# Patient Record
Sex: Male | Born: 1955
Health system: Southern US, Community
[De-identification: ages and names within clinical notes are randomized; demographics above are authoritative.]

## PROBLEM LIST (undated history)

## (undated) DIAGNOSIS — T7840XA Allergy, unspecified, initial encounter: Secondary | ICD-10-CM

## (undated) DIAGNOSIS — I10 Essential (primary) hypertension: Secondary | ICD-10-CM

## (undated) DIAGNOSIS — M1990 Unspecified osteoarthritis, unspecified site: Secondary | ICD-10-CM

## (undated) DIAGNOSIS — N400 Enlarged prostate without lower urinary tract symptoms: Secondary | ICD-10-CM

## (undated) DIAGNOSIS — J45909 Unspecified asthma, uncomplicated: Secondary | ICD-10-CM

## (undated) DIAGNOSIS — G4730 Sleep apnea, unspecified: Secondary | ICD-10-CM

## (undated) DIAGNOSIS — K219 Gastro-esophageal reflux disease without esophagitis: Secondary | ICD-10-CM

## (undated) DIAGNOSIS — H269 Unspecified cataract: Secondary | ICD-10-CM

## (undated) HISTORY — PX: JOINT REPLACEMENT: SHX530

## (undated) HISTORY — PX: NASAL SEPTUM SURGERY: SHX37

## (undated) HISTORY — DX: Benign prostatic hyperplasia without lower urinary tract symptoms: N40.0

## (undated) HISTORY — DX: Unspecified cataract: H26.9

## (undated) HISTORY — DX: Sleep apnea, unspecified: G47.30

## (undated) HISTORY — PX: KNEE ARTHROSCOPY: SUR90

## (undated) HISTORY — PX: FRACTURE SURGERY: SHX138

## (undated) HISTORY — DX: Gastro-esophageal reflux disease without esophagitis: K21.9

## (undated) HISTORY — DX: Unspecified asthma, uncomplicated: J45.909

## (undated) HISTORY — DX: Essential (primary) hypertension: I10

## (undated) HISTORY — PX: HEMORRHOID SURGERY: SHX153

## (undated) HISTORY — PX: VASECTOMY: SHX75

## (undated) HISTORY — DX: Allergy, unspecified, initial encounter: T78.40XA

## (undated) HISTORY — PX: TONSILLECTOMY: SHX5217

## (undated) HISTORY — DX: Unspecified osteoarthritis, unspecified site: M19.90

---

## 1984-03-10 HISTORY — PX: KNEE SURGERY: SHX244

## 1988-03-10 HISTORY — PX: APPENDECTOMY: SHX54

## 2005-06-10 ENCOUNTER — Ambulatory Visit: Payer: Self-pay | Admitting: Pulmonary Disease

## 2005-06-25 ENCOUNTER — Ambulatory Visit: Payer: Self-pay

## 2005-06-25 ENCOUNTER — Encounter: Payer: Self-pay | Admitting: Cardiovascular Disease

## 2005-07-22 ENCOUNTER — Ambulatory Visit: Payer: Self-pay | Admitting: Pulmonary Disease

## 2006-03-10 HISTORY — PX: COLONOSCOPY: SHX174

## 2006-04-16 ENCOUNTER — Ambulatory Visit: Payer: Self-pay | Admitting: Internal Medicine

## 2006-05-06 ENCOUNTER — Ambulatory Visit: Payer: Self-pay | Admitting: Internal Medicine

## 2006-05-22 ENCOUNTER — Ambulatory Visit: Payer: Self-pay | Admitting: Pulmonary Disease

## 2006-05-22 ENCOUNTER — Ambulatory Visit: Payer: Self-pay | Admitting: Cardiovascular Disease

## 2006-06-25 ENCOUNTER — Ambulatory Visit: Payer: Self-pay | Admitting: Internal Medicine

## 2006-07-13 ENCOUNTER — Ambulatory Visit: Payer: Self-pay | Admitting: General Surgery

## 2006-07-13 LAB — HM COLONOSCOPY

## 2006-09-25 ENCOUNTER — Ambulatory Visit: Payer: Self-pay | Admitting: Internal Medicine

## 2006-10-16 ENCOUNTER — Ambulatory Visit: Payer: Self-pay | Admitting: Internal Medicine

## 2006-10-29 ENCOUNTER — Ambulatory Visit: Payer: Self-pay | Admitting: Internal Medicine

## 2006-11-17 ENCOUNTER — Ambulatory Visit: Payer: Self-pay | Admitting: Internal Medicine

## 2006-11-24 ENCOUNTER — Ambulatory Visit: Payer: Self-pay | Admitting: Internal Medicine

## 2007-03-15 DIAGNOSIS — I10 Essential (primary) hypertension: Secondary | ICD-10-CM

## 2007-03-15 DIAGNOSIS — J45909 Unspecified asthma, uncomplicated: Secondary | ICD-10-CM | POA: Insufficient documentation

## 2007-03-15 DIAGNOSIS — R05 Cough: Secondary | ICD-10-CM

## 2007-03-15 DIAGNOSIS — K219 Gastro-esophageal reflux disease without esophagitis: Secondary | ICD-10-CM | POA: Insufficient documentation

## 2007-03-15 DIAGNOSIS — R059 Cough, unspecified: Secondary | ICD-10-CM | POA: Insufficient documentation

## 2007-03-15 DIAGNOSIS — J310 Chronic rhinitis: Secondary | ICD-10-CM | POA: Insufficient documentation

## 2007-03-15 HISTORY — DX: Essential (primary) hypertension: I10

## 2007-03-25 ENCOUNTER — Ambulatory Visit: Payer: Self-pay | Admitting: Internal Medicine

## 2007-03-25 DIAGNOSIS — J209 Acute bronchitis, unspecified: Secondary | ICD-10-CM | POA: Insufficient documentation

## 2007-04-22 ENCOUNTER — Ambulatory Visit: Payer: Self-pay | Admitting: Internal Medicine

## 2007-07-06 ENCOUNTER — Telehealth: Payer: Self-pay | Admitting: Internal Medicine

## 2008-11-20 ENCOUNTER — Ambulatory Visit: Payer: Self-pay | Admitting: Family Medicine

## 2009-01-12 ENCOUNTER — Ambulatory Visit: Payer: Self-pay | Admitting: Family Medicine

## 2010-07-02 ENCOUNTER — Ambulatory Visit: Payer: Self-pay | Admitting: Unknown Physician Specialty

## 2010-07-03 LAB — PATHOLOGY REPORT

## 2010-07-23 NOTE — Assessment & Plan Note (Signed)
[facility]                             PULMONARY OFFICE NOTE   NAME:[name], [name] [name]              MRN:          [id number]  DATE:11/17/2006                            DOB:          1956/01/16    HISTORY:  This 55 year old white male with marked improvement in chronic  cough when I saw him on October 29, 2006, to the point where I was  encouraged and recommended a trial on a lower dose of Nexium.  He found  that within one week of reducing the Nexium, that the cough began again.  It did not respond to Prilosec over-the-counter.  The cough is dry in  nature, worse as the day goes on and he actually has overt heartburn.  I  understand that he has already seen a gastroenterologist in [place],  who reports that he does have a hiatal hernia.   The patient denies any excessive sputum production, active nasal  complaints, itching, sneezing or subjective wheezing, orthopnea, PND,  leg swelling or significant nocturnal exacerbation of his symptoms.   CURRENT MEDICATIONS:  Please see the face sheet dated November 17, 2006.  Note that he was taken off of Tekturna/hydrochlorothiazide because of  possible adverse effect and placed on Diovan 320 mg one daily, on his  previous visit.   PHYSICAL EXAMINATION:  VITAL SIGNS:  On this regimen his blood pressure  is 148/90.  He is afebrile.  Benign vital signs.  GENERAL:  He looks great.  HEENT:  Unremarkable.  Pharynx clear.  LUNGS:  Fields are clear bilaterally to auscultation and percussion with  no cough elicited on inspiratory or expiratory maneuvers.  He did have  the urge to cough, however, on inspiration.  HEART:  A regular rate and rhythm without murmur, gallop or rub.  ABDOMEN:  Soft.  EXTREMITIES:  Warm without calf tenderness, cyanosis, clubbing or edema.   IMPRESSION:  1. Cough had definitely improved on Nexium and now is exacerbated      again on lower doses and alternative/over-the-counter  medications.      I believe high-dose Nexium is justified, as well as regular      gastrointestinal followup, but I will leave that to Dr. Lynnell Grain discretion.  2. One of the medicines that may be interfering with treatment of      reflux is Norvasc, since he is taking it in high doses and it      relaxes the gastroesophageal valve.  I am going to take the bold      step of re-challenging with Tekturna, this time without the      hydrochlorothiazide and see if we can use Tekturna  300 mg one      daily and eliminate the amlodipine from his regimen.  If not, I      might consider nocturnal Tenex, or even a specific beta blocker      like bisoprolol instead of the amlodipine, at least until we figure      out whether or not this is contributing to his refractory reflux.   FOLLOWUP:  Pulmonary followup can be p.r.n.  since he is doing actually  very well from my perspective on his present regimen, which I have  reviewed with him in writing in detail line by line.  I have also asked  him to show the same medication calendar that he showed me to Dr.  Sullivan Lone, so that we can all read off the same page going forward.  I  will see the patient back on a p.r.n. basis.     Charlaine Dalton. Sherene Sires, MD, [facility]  Electronically Signed    MBW/MedQ  DD: 11/17/2006  DT: 11/18/2006  Job #: [id number]   cc:   Julieanne Manson

## 2010-07-23 NOTE — Assessment & Plan Note (Signed)
[facility]                             PULMONARY OFFICE NOTE   NAME:Fero, ELJAY LAVE              MRN:          [id number]  DATE:11/24/2006                            DOB:          02/14/1956    PULMONARY/EXTENDED FOLLOWUP OFFICE VISIT   HISTORY:  This is a very challenging 55 year old white male with severe  hypertension and chronic cough that is felt to be at least partially  upper airway in nature related to rhinitis, reflux, or both.  Because  reflux entered the differential, I recommended eliminating Norvasc on  his previous visit and continuing Tekturna 300 mg daily and he was able  to tolerate this fine, although it is not clear that this worked  effectively.  However, he comes back today acutely ill for the last few  days with a hacking cough productive of green sputum, subjective  wheezing, and chest congestion, but no pleuritic pain, nocturnal  wheeze, fever, chills, sweats, orthopnea, PND, or leg swelling.   For full inventory of medications, please see face sheet dated November 24, 2006, which is correct as listed.   PHYSICAL EXAMINATION:  This is an anxious white male in no acute  distress.  VITAL SIGNS:  Stable vital signs except for a blood pressure 168/110.  Afebrile.  HEENT:  Unremarkable.  Oropharynx is clear.  LUNGS:  The lung fields reveal junky inspiratory and expiratory rhonchi  bilaterally.  CARDIAC:  Regular rate and rhythm without murmur, gallop, or rub.  ABDOMEN:  Soft and benign.  EXTREMITIES:  Warm without calf tenderness, cyanosis, clubbing, or  edema.   IMPRESSION:  1. Acute flare-up of chronic asthmatic bronchitis.  I recommended      Doxycycline for 10 days, prednisone for 8 days starting at 40 mg      per day and tapering off.  2. In terms of hypertension management, Danie Chandler is not effective, and      perhaps a bit redundant since he is on Diovan.  I have recommended      he continue Diovan 320 q.  a.m. and add Tenex 2 mg nightly (I would      like to avoid calcium channel blockers here because the issue of      reflux has not been totally addressed yet).   I went over each and every one of these recommendations with him in  writing, including using all of his p.r.n. medications.  I would also  add parenthetically that he seems to be breaking right through Singulair  and Symbicort, and it may be that he does not need both, but would  benefit  from perhaps a little higher dose of Symbicort and eliminating Singulair  from his regimen to simplify his regimen, but will address this on his  next visit.     Charlaine Dalton. Sherene Sires, MD, [facility]  Electronically Signed    MBW/MedQ  DD: 11/24/2006  DT: 11/24/2006  Job #: [id number]   cc:   Julieanne Manson

## 2010-07-23 NOTE — Assessment & Plan Note (Signed)
[facility]                             PULMONARY OFFICE NOTE   NAME:[name] [name], [name] [name]              MRN:          [id number]  DATE:10/16/2006                            DOB:          1955-06-24    HISTORY OF PRESENT ILLNESS:  Patient is a 55 year old white male patient  of Dr. Thurston Hole  who has a known history of asthma and cyclical cough,  returns today for 2 week follow up and for med review. Last visit  patient's cough had totally improved by eliminating Advair and  maximizing his reflux prevention regimen. Unfortunately, he developed  wheezing off of his Advair. Last visit patient was started on Symbicort  80/4.5 two puffs twice daily. And given a prednisone pack. Also  Singulair was added back into his regimen. Patient returns back today  reporting that his wheezing has totally resolved. He has not had to use  ProAir inhaler since last visit. However his cough has slowly returned,  is worsened when he talks for a long period of time, laughs, and at  bedtime. Patient's blood pressure had been elevated last visit. Patient  had been changed over to Tekturna/hydrochlorothiazide 300/25. Patient's  blood pressures have been doing exceptionally well at home, systolic  pressures at 122/40. Patient denies any purulent sputum, hemoptysis,  orthopnea, PND, patient does have some nasal congestion but is not aware  of any significant postnasal drip symptoms.   PAST MEDICAL HISTORY:  Reviewed.   CURRENT MEDICATIONS:  Reviewed.   PHYSICAL EXAMINATION:  Patient is a pleasant thin, male in no acute  distress. He is afebrile with stable vital signs.  HEENT: Nasal mucosa is erythematous. Nontender sinuses. Poster pharynx  is clear without an exudate or redness.  NECK: Supple without cervical adenopathy. No JVD.  LUNG SOUNDS Clear without any wheezing or crackles. No pseudo wheezing  is noted.  CARDIAC: Regular rate.  ABDOMEN: Soft and nontender.  EXTREMITIES: Warm without any edema.   IMPRESSION AND PLAN:  1. Asthma with tendency towards cyclical cough. Patient's asthma is      well controlled on Symbicort. However is cough has slowly returned.      Which could also be do to Symbicort however patient does appear to      be doing very well and cough is not as bad as it was at one time.      Therefore we will try to eliminate his cough by using a cough      suppression regimen including Mucinex DM twice daily, tramadol for      breakthrough coughing. And we will add back in Zyrtec 10 mg at      bedtime as needed for any postnasal drip symptoms that could be      irritating his airways. Patient will return back here in 4 weeks      with Dr. Sherene Sires or sooner if needed.  2. Complex medication regimen, patient's medication reviewed in      detail. Patient education      was provided. A computerized medication calendar was completed for      this patient and reviewed.  Rubye Oaks, NP  Electronically Signed      Charlaine Dalton. Sherene Sires, MD, [facility]  Electronically Signed   TP/MedQ  DD: 10/16/2006  DT: 10/16/2006  Job #: [id number]

## 2010-07-23 NOTE — Assessment & Plan Note (Signed)
[facility]                             PULMONARY OFFICE NOTE   NAME:[name] [name], [name] [name]              MRN:          [id number]  DATE:09/25/2006                            DOB:          Feb 25, 1956    HISTORY:  A 55 year old white male with refractory symptoms of coughing,  wheezing, and dyspnea, who also carries a diagnosis of reflux, by trial  and error we had eliminated his cough by eliminating Advair, and I  assumed, therefore, that it was irritating the upper airway more than it  was helping the lower airway.  However, since that change was made, he  has now shifted focus to dyspnea and subjective wheezing with nocturnal  awakening that he says, in retrospect, has been present for over a year.  He averages 1 or 2 uses of albuterol per day and actually comes in today  having run out of albuterol.   1. Presently, he is maintained on a combination of Symbicort 80/4.5      two puffs q.i.d.  2. Amlodipine 5 mg daily.  3. Tekturna 300 mg daily.  4. Nexium 40 mg daily.   He had stopped Singulair on his previous visit because he had  breakthrough symptoms while on Singulair.  He thinks, maybe his nasal  symptoms are a little worse since he stopped Singulair.  He denies any  pleuritic or exertional chest pain, purulent sputum, fevers, chills,  sweats, orthopnea, PND, or leg swelling.   PHYSICAL EXAMINATION:  He is a pleasant, ambulatory white male in no  acute distress.  Blood pressure 160/90.  HEENT:  Mild turbinate edema.  Oropharynx is clear with no evidence of  excessive post-nasal drainage or cobble stoning.  NECK:  Supple without cervical adenopathy or tenderness.  Trachea is  midline.  No thyromegaly.  LUNGS:  The lung fields reveal both pseudo-wheeze and true wheeze  bilaterally.  Overall air movement is adequate, however.  CARDIAC:  Regular rate and rhythm without murmur, gallop, or rub.  ABDOMEN:  Soft and benign.  EXTREMITIES:  Warm  without calf tenderness, cyanosis, clubbing, or  edema.   Hemoglobin saturation is 99% on room air.   IMPRESSION:  1. This patient clearly has asthma and note that the cough is most      likely agravated by dry powder inhalers.  Therefore, HFA products      are the best bet for now.  I am concerned about pushing the does of      Symbicort higher because of the cough, and the possibility that the      higher dose of inhaled steroids might contribute to cough, and      therefore, I am going to recommend the same dose of Symbicort, but      add back Singulair for 3 weeks empirically.  If this does not work,      the next step is to push the Symbicort higher.  2. His metered dose inhaler technique was adequate at baseline (75%),      but improved to nearly 90% with coaching.  3. Hypertension is not optimally controlled.  I am concerned about  pushing calcium channel blockers higher in this patient with      documented reflux (calcium channel blockers interfere with the      effects of gastroesophageal valve).  Instead, I am going to add      hydrochlorothiazide 25 mg to the Tekturna (I gave him combination      product for the next month) and if his blood pressure improves, I      would not hesitate to eliminate the calcium channel blocker      entirely.  If not, perhaps 1 option might be to switch over to      minoxidil or Cardura for vasodilator purposes depending on whether      he is having any benign prostatic hypertrophy symptoms at all.  4. Also, I am concerned about his ability to process multiple      medication titrations using the medication sheet that he brought      with him today, which is actually not fully filled out.  I have      recommended that he return to our nurse practitioner within 3 weeks      for the purpose of medication reconciliation and generation of a      new medication calendar.     Charlaine Dalton. Sherene Sires, MD, [facility]  Electronically Signed    MBW/MedQ   DD: 09/25/2006  DT: 09/25/2006  Job #: [id number]   cc:   Julieanne Manson  Sidney Ace, M.D. [facility]

## 2010-07-23 NOTE — Assessment & Plan Note (Signed)
[facility]                             PULMONARY OFFICE NOTE   NAME:Mickley, DARTAGNAN BEAVERS              MRN:          [id number]  DATE:10/29/2006                            DOB:          11/19/1955    PULMONARY ACUTE OFFICE EVALUATION.   HISTORY:  Mr. Krotzer is a 55 year old white male with marked  improvement in chronic cough since change in medications but, as  noticed, which includes the elimination of Ace inhibitors and dry  powdered inhalers and includes Nexium 40 mg b.i.d. plus Tekturna 300/25  one daily.  He says he feels that this is too strong and has been having  decreased sexual function, erectile function as well as aches in his  hips that he attributes to the medication change.  However, he has also  noticed increased diarrhea but note he has had no significant dyspnea or  cough, the original complaints that led to his evaluation.   PHYSICAL EXAMINATION:  He is a somewhat anxious ambulatory white man in  no acute distress with a blood pressure of 140/80.  He is afebrile with  normal vital signs.  HEENT:  Unremarkable.  Oropharynx clear.  LUNG FIELDS:  Clear bilaterally to auscultation and percussion with no  cough, lifts on inspiratory or expiratory maneuvers.  HEART:  Regular rhythm without any murmur, gallop or rub.  ABDOMEN:  Soft, benign.  EXTREMITIES:  Warm without any calf tenderness, cyanosis, clubbing,  edema.   IMPRESSION:  1. Hypertension is well controlled but may be having adverse effect      from Tekturna, the only new antihypertensive that was added.  I      recommended switching over to Diovan 320 one daily and advised him      that his blood pressure and/or leg swelling may be slightly      worsened on this regimen (without the diuretic) but that it would      be reasonable to try adding back a diuretic in the form of      furosemide since one question is whether the hydrochlorothiazide is      the culprit  here.  2. Diarrhea.  The only medication associated with diarrhea was Nexium      which I have asked him to reduce down to one daily to see if it      makes any difference in terms of this symptom.  If his diarrhea and      joint aches continue, I would more concerned about the possibility      of inflammatory bowel disease.   He is already scheduled to see [place] in 2 weeks for followup with  evaluation of cough and dyspnea and I have reviewed every one of his  maintenance vs. p.r.n.'s with him to make sure we are all on the same  wave length in terms of medication administration.    Charlaine Dalton. Sherene Sires, MD, [facility]  Electronically Signed   MBW/MedQ  DD: 10/29/2006  DT: 10/30/2006  Job #: [id number]   cc:   Dr. Mason Callas

## 2010-07-26 NOTE — Assessment & Plan Note (Signed)
[facility]                             PULMONARY OFFICE NOTE   NAME:[name], [name] HYUN              MRN:          [id number]  DATE:05/22/2006                            DOB:          10-14-1955    HISTORY OF PRESENT ILLNESS:  The patient is a 55 year old, white, male  patient of Dr. Thurston Hole who has a known history of asthma and rhinitis who  presents for a two week followup.  Last visit, the patient was having  persistent symptoms of cough, wheezing, and shortness of breath.  The  patient was changed over from Advair to Qvar for upper airway  instability and given a prednisone taper.  He was also maximized on his  anti-reflux prevention with Nexium twice daily, and given a cough  suppressant regimen with Delsym and tramadol.  The patient did undergo a  CT scan; however, that was done this morning and results are not  available at today's office visit.  The patient returns today reporting  that he has substantially improved with cough almost totally resolved.  He still has a few wheezes but it is much improved and only occurs with  lying flat or occasionally going up the stairs.  The patient was  supposed to have brought all of his medications in today per request of  Dr. Sherene Sires last visit.  However, the patient reports he is not going to  bring his medicines in because he has a very concise list and does not  wish to load all his medicines throughout the day.   PAST MEDICAL HISTORY:  Reviewed.   CURRENT MEDICATIONS:  Reviewed.   PHYSICAL EXAMINATION:  GENERAL:  The patient is a pleasant male in no  acute distress.  He is afebrile with stable vital signs.  HEENT:  Unremarkable.  NECK:  Supple without cervical adenopathy.  No JVD.  LUNGS:  Clear bilaterally.  CARDIAC:  Regular rate.  ABDOMEN:  Soft and nontender.  EXTREMITIES:  Warm without any edema.   IMPRESSION AND PLAN:  Chronic cough with significant upper airway  instability felt  secondary to possibly rhinitis and/or reflux.  The  patient is much improved.  He will continue on his regimen of Qvar two  puffs twice daily, along with Nexium twice daily for any residual reflux  that could be irritating the airways.  The patient will return back here  in four weeks with Dr. Sherene Sires  as scheduled, or sooner if needed.  Also, we will contact the patient  with the CT of the sinus scan results.      Rubye Oaks, NP  Electronically Signed      Charlaine Dalton. Sherene Sires, MD, [facility]  Electronically Signed   TP/MedQ  DD: 05/22/2006  DT: 05/23/2006  Job #: [id number]

## 2010-07-26 NOTE — Assessment & Plan Note (Signed)
[facility]                             PULMONARY OFFICE NOTE   NAME:[name] [name], [name] [name]              MRN:          [id number]  DATE:04/16/2006                            DOB:          04-05-1955    HISTORY OF PRESENT ILLNESS:  The patient is a 55 year old white male  patient of Dr. Jayme Cloud, who has a known history of cough that presents  for an acute office visit.  The patient complains that over the last 3  months he has had what he describes as poorly controlled asthma with  increased wheezing, nocturnal awakenings and albuterol use.  The patient  complains that he is having to use his albuterol over the last 2 weeks 2  to 3 times a day.  Initially about 3 months ago the symptoms worsened  and he was using it on average about once to twice a day.  The patient  complains of nasal congestion, postnasal drip, wheezing and dry cough.  He denies any purulent sputum, chest pain, overt reflux symptoms, leg  swelling or recent antibiotic use.  The patient does travel quite a bit,  however, has been within the country over the last 6 months.   CURRENT MEDICATIONS:  The medications were reviewed with this patient,  and he does appear to be taking his medications appropriately and  rinsing after his Advair use.  He is presently on:  1. Advair HFA 230/21 two puffs twice daily.  2. Singulair 10 mg daily.  3. Allegra 180 mg daily.  4. Nasacort 2 puffs daily.  5. The patient had also previously been on ACE inhibitor last year,      and this has been discontinued due to cough, and is now presently      on Tekturna and amlodipine for blood pressure.  6. The patient also has a history of reflux and has been taking Nexium      twice a day and denies any breakthrough reflux symptoms.   PAST MEDICAL HISTORY:  Reviewed.   PHYSICAL EXAMINATION:  The patient is a pleasant white male in no acute  distress.  He is afebrile, stable vital signs.  O2 saturation is  95% on room air.  HEENT:  Nasal mucosa is slightly pale.  No adventitial signs.  The  posterior pharynx is clear.  NECK:  Supple without adenopathy.  No JVD.  LUNGS:  Lung sounds are clear to auscultation bilaterally without any  wheezing or crackles.  CARDIAC:  Regular rate and rhythm.  ABDOMEN:  Soft and nontender.  EXTREMITIES:  Warm without any calf tenderness, cyanosis, clubbing or  edema.   DATA:  In-office spirometry reveals an FEV1 of 3.99 liters, which is  103% of predicted.  This is simply no change from previous spirometry in  April of 2007.   IMPRESSION AND PLAN:  1. Asthmatic flare versus upper airway instability secondary to      rhinitis and reflux.  Despite significant symptoms, the patient has      a completely normal spirometry today in the office.  The patient      will begin on an aggressive cough suppression  regimen with Mucinex      DM twice daily, Tessalon Perles as needed and tramadol 50      milligrams as needed for cough control.  He will begin a prednisone      taper over the next week and will recheck here in 2 weeks with Dr.      Jayme Cloud or Dr. Sherene Sires.  2. Rhinitis, which could be contributing to #1.  The patient will be      changed over from Nasacort AQ to Veramyst 2 puffs twice daily.  The      patient may also use saline nasal spray as needed.      Rubye Oaks, NP  Electronically Signed      Gailen Shelter, MD  Electronically Signed   TP/MedQ  DD: 04/16/2006  DT: 04/17/2006  Job #: [id number]

## 2010-07-26 NOTE — Assessment & Plan Note (Signed)
[facility]                             PULMONARY OFFICE NOTE   NAME:[name] [name], [name] [name]              MRN:          [id number]  DATE:06/25/2006                            DOB:          1955-05-05    HISTORY:  A 55 year old white male with chronic cough who carries a  diagnosis of asthma and rhinitis, but whose cough was disproportionate  to objective findings when I saw him on February 28 at the request of  Dr. Parker Callas.  Recommended switching over purely to Qvar as his asthma  medicine, and stopping Advair, as well as continuing to treat reflux  aggressively.  He states that this worked 100% until the onset of  increased nasal congestion over the last several weeks with a sensation  of nasal drainage, but with minimal active sputum production, and  increasing nocturnal need for albuterol with paroxysms of coughing and  dyspnea much different from baseline.   This occurred while still taking Singulair and Qvar consistently, and  using Nasacort 2 puffs b.i.d.   For full inventory of his medicines, which he keeps in a very neat list,  albeit not either user friendly nor well organized in terms of  maintenance versus p.r.n., see column dated June 25, 2006.   PHYSICAL EXAMINATION:  He is a pleasant ambulatory white male, in no  acute distress.  Stable vital signs.  HEENT:  Reveals moderate turbinate edema.  Oropharynx was clear.  NECK:  Supple without cervical adenopathy or tenderness.  Trachea is  midline.  LUNG FIELDS:  Reveal trace wheeze on FVC maneuver only.  HEART:  Regular rhythm without murmur, gallop or rub.  ABDOMEN:  Soft and benign.  EXTREMITIES:  Warm without calf tenderness, cyanosis, clubbing or edema.   Hgb saturation 95% on room air.   IMPRESSION:  First episode of what amounts to asthma exacerbation in the  setting of poorly controlled rhinitis.  I had worked him for sinusitis  as well a month ago, and found that he had  perfectly normal sinuses,  date May 22, 2006, so I doubt this is active sinus infection, but  rather just poorly controlled rhinitis.   One of the issues, of course, is that he is supposed to already be using  Nasacort, but does not have a good feel for how to approach an acute  exacerbation in terms of rhinitis.  I spent extra time going over  chronic rhinitis flare with emphasis using the appropriate use of Afrin  12-hour 1 puff b.i.d. before Nasacort for exacerbations, but only for 5  days.   At this point, I would consider him a Singulair failure, and have asked  him to stop this to simplify his regimen.  I spent most of the extra 15  to 20 minutes going over the chronic rhinitis, but also a reasonable  strategy for him to address p.r.n. issues as follows:  1. For wheezing, use albuterol 2 puffs q.4. p.r.n.  2. For nasal congestion, use Afrin for 5 days.  3. For itching and sneezing, use Allegra, therefore, teasing out of      his list of maintenance medicines.  Those medicines, I think, would      be more appropriate on a p.r.n. basis.   Finally, because he broke through Qvar, I am also going to recommend at  this point switching to Symbicort 80/4.5 two puffs b.i.d., acknowledging  that it is the larger particle, and may cause his cough to exacerbate,  but given the fact that he is having to use albuterol more frequently,  and therefore, breaking the rule of 2's, that it is justified to use  combination therapy here.     Charlaine Dalton. Sherene Sires, MD, [facility]  Electronically Signed    MBW/MedQ  DD: 06/25/2006  DT: 06/26/2006  Job #: [id number]   cc:   Sidney Ace, M.D. [facility]  Richard Sullivan Lone

## 2010-07-26 NOTE — Assessment & Plan Note (Signed)
[facility]                             PULMONARY OFFICE NOTE   [name] [name], [name] [name]              MRN:          [id number]  DATE:05/07/2006                            DOB:          1955-03-21    REFERRING PHYSICIAN:  Sidney Ace, M.D. [facility]   HISTORY:  This is a 55 year old white male who has already seen Dr.  Jayme Cloud for evaluation of dyspnea and asthma.  Interestingly, cough was  not the main problem initially but now it is and now no longer is  responding to treatment directed at asthma.   The patient has had a history of rhinitis for the last 6 years for which  he is on immunotherapy.  He says initially his cough seemed better while  on high doses of inhaled steroids including both Asmanex and Flovent at  the same time, but worse since he switched over to Advair.  He  definitely gets better when he gets a round of prednisone, only to  relapse off of prednisone, and he is worse immediately on lying down at  night but better after he uses albuterol.  Compounding the problem, he  has developed an essential tremor which is made worse by using  albuterol.  Patient denies any pleuritic pain, fever, chills, sweats or  definite environment or weather triggers in terms of his symptoms, which  are sporadic mostly during the day but always occur when he lies down at  night.   PAST MEDICAL HISTORY:  Hypertension for which he has previously been on  ACE inhibitors.   ALLERGIES:  1. SUDAFED.  2. AVELOX.  3. SULFA.   MEDICATIONS:  Taken in detail on the worksheet column dated May 06, 2006.   SOCIAL HISTORY:  He has never smoked.   FAMILY HISTORY:  Significant for asthma in one of his sisters and  allergies in two of his sisters.   REVIEW OF SYSTEMS:  Taken in detail and negative except as outlined  above.   PHYSICAL EXAMINATION:  This is a pleasant ambulatory white male in no  acute distress, afebrile with stable vital signs  except for a blood  pressure 160/94.  HEENT:  Remarkable for moderate turbinate edema with no pallor, polyps  or cyanosis.  OROPHARYNX:  Clear with no evidence of excessive postnasal drainage or  cobblestoning.  NECK:  Supple without cervical adenopathy or tenderness, trachea is  midline, no thyromegaly.  LUNG FIELDS:  Perfectly clear bilaterally to auscultation and  percussion, with no cough elicited on inspiratory or expiratory  maneuvers.  HEART:  Regular rate and rhythm without murmur, gallop or rub.  ABDOMEN:  Soft, benign.  EXTREMITIES:  Warm without any calf tenderness, cyanosis, clubbing or  edema.   Albuterol use was reviewed and is about 75% effective.   IMPRESSION:  Although this patient may have asthma, I do not think it is  the main driving force behind the cough since it has not responded to  Advair.  I am going to ask him to stop Advair because it causes the most  difficulty with upper airway cough, in my experience, related to dry  powder.  I have recommended instead QVAR which has the best profile in  the form of QVAR 40 two puffs twice daily.  I did give him a 6-day  course of prednisone because he says he gets so much better from  prednisone.  If he relapses again on QVAR 40, I would recommend QVAR 80  and only as a last resort add back two inhaled steroids at the same time  because although this may control the airways inflammation better it  will certainly lead to more upper airways instability.   To approach the cyclical reflux component that always develops with this  type cough, I recommended taking Nexium 30 minutes before his first and  last meal, until he returns in 2 weeks for followup, and using Delsym  for cough suppression.   Because the immediate cough occurs when he lies down at night, also I  believe a sinus CT scan is needed here and was ordered (last one was  done several  years ago, per patient report).     [name] [name]. [name] Sires, MD, [facility]   Electronically Signed    MBW/MedQ  DD: 05/07/2006  DT: 05/07/2006  Job #: [id number]   cc:   Sidney Ace, M.D. [facility]  Richard Sullivan Lone

## 2011-01-09 ENCOUNTER — Ambulatory Visit: Payer: Self-pay | Admitting: Internal Medicine

## 2011-04-17 ENCOUNTER — Ambulatory Visit: Payer: Self-pay | Admitting: Family Medicine

## 2011-05-22 ENCOUNTER — Ambulatory Visit: Payer: Self-pay | Admitting: Family Medicine

## 2011-06-17 ENCOUNTER — Ambulatory Visit: Payer: Self-pay | Admitting: Family Medicine

## 2012-09-29 DIAGNOSIS — Z1159 Encounter for screening for other viral diseases: Secondary | ICD-10-CM | POA: Insufficient documentation

## 2013-04-19 ENCOUNTER — Ambulatory Visit: Payer: Self-pay | Admitting: Family Medicine

## 2013-06-27 ENCOUNTER — Encounter: Payer: Self-pay | Admitting: Rheumatology

## 2013-07-08 ENCOUNTER — Encounter: Payer: Self-pay | Admitting: Rheumatology

## 2013-10-12 ENCOUNTER — Encounter: Payer: Self-pay | Admitting: General Surgery

## 2013-10-25 ENCOUNTER — Ambulatory Visit: Payer: Self-pay | Admitting: General Surgery

## 2013-10-26 DIAGNOSIS — Z8768 Personal history of other (corrected) conditions arising in the perinatal period: Secondary | ICD-10-CM | POA: Insufficient documentation

## 2013-10-26 DIAGNOSIS — Z23 Encounter for immunization: Secondary | ICD-10-CM | POA: Insufficient documentation

## 2013-10-26 DIAGNOSIS — R509 Fever, unspecified: Secondary | ICD-10-CM | POA: Insufficient documentation

## 2013-10-26 DIAGNOSIS — Z7189 Other specified counseling: Secondary | ICD-10-CM | POA: Insufficient documentation

## 2013-10-26 DIAGNOSIS — M79609 Pain in unspecified limb: Secondary | ICD-10-CM | POA: Insufficient documentation

## 2013-10-26 DIAGNOSIS — Z87898 Personal history of other specified conditions: Secondary | ICD-10-CM | POA: Insufficient documentation

## 2013-10-26 DIAGNOSIS — I10 Essential (primary) hypertension: Secondary | ICD-10-CM | POA: Insufficient documentation

## 2013-10-26 DIAGNOSIS — L0390 Cellulitis, unspecified: Secondary | ICD-10-CM | POA: Insufficient documentation

## 2013-10-26 DIAGNOSIS — E291 Testicular hypofunction: Secondary | ICD-10-CM | POA: Insufficient documentation

## 2013-10-26 DIAGNOSIS — R002 Palpitations: Secondary | ICD-10-CM | POA: Insufficient documentation

## 2013-11-02 ENCOUNTER — Ambulatory Visit (INDEPENDENT_AMBULATORY_CARE_PROVIDER_SITE_OTHER): Payer: BC Managed Care – PPO | Admitting: General Surgery

## 2013-11-02 ENCOUNTER — Encounter: Payer: Self-pay | Admitting: General Surgery

## 2013-11-02 VITALS — BP 160/82 | HR 88 | Resp 14 | Ht 72.0 in | Wt 205.0 lb

## 2013-11-02 DIAGNOSIS — K429 Umbilical hernia without obstruction or gangrene: Secondary | ICD-10-CM

## 2013-11-02 NOTE — Patient Instructions (Addendum)
Hernia A hernia occurs when an internal organ pushes out through a weak spot in the abdominal wall. Hernias most commonly occur in the groin and around the navel. Hernias often can be pushed back into place (reduced). Most hernias tend to get worse over time. Some abdominal hernias can get stuck in the opening (irreducible or incarcerated hernia) and cannot be reduced. An irreducible abdominal hernia which is tightly squeezed into the opening is at risk for impaired blood supply (strangulated hernia). A strangulated hernia is a medical emergency. Because of the risk for an irreducible or strangulated hernia, surgery may be recommended to repair a hernia. CAUSES   Heavy lifting.  Prolonged coughing.  Straining to have a bowel movement.  A cut (incision) made during an abdominal surgery. HOME CARE INSTRUCTIONS   Bed rest is not required. You may continue your normal activities.  Avoid lifting more than 10 pounds (4.5 kg) or straining.  Cough gently. If you are a smoker it is best to stop. Even the best hernia repair can break down with the continual strain of coughing. Even if you do not have your hernia repaired, a cough will continue to aggravate the problem.  Do not wear anything tight over your hernia. Do not try to keep it in with an outside bandage or truss. These can damage abdominal contents if they are trapped within the hernia sac.  Eat a normal diet.  Avoid constipation. Straining over long periods of time will increase hernia size and encourage breakdown of repairs. If you cannot do this with diet alone, stool softeners may be used. SEEK IMMEDIATE MEDICAL CARE IF:   You have a fever.  You develop increasing abdominal pain.  You feel nauseous or vomit.  Your hernia is stuck outside the abdomen, looks discolored, feels hard, or is tender.  You have any changes in your bowel habits or in the hernia that are unusual for you.  You have increased pain or swelling around the  hernia.  You cannot push the hernia back in place by applying gentle pressure while lying down. MAKE SURE YOU:   Understand these instructions.  Will watch your condition.  Will get help right away if you are not doing well or get worse. Document Released: 02/24/2005 Document Revised: 05/19/2011 Document Reviewed: 10/14/2007 [facility] Patient Information 2015 [place], [place]. This information is not intended to replace advice given to you by your health care provider. Make sure you discuss any questions you have with your health care provider.  Patient's surgery has been scheduled for 01-20-14 at [facility].

## 2013-11-02 NOTE — Progress Notes (Addendum)
Patient ID: [name] [name], male   DOB: [birth year], 58 y.o.   MRN: [id number]  Chief Complaint  Patient presents with  . Hernia    HPI CONAL [name] [name] is a 58 y.o. male.  Here today for evaluation of a possible hernia. He states that about 6 months ago he had bronchitis and was doing a lot of coughing and that is when he first noticed the "bulge" near his umbilical area. Denies any gastrointestinal issues. He can "push it back in". Denies any pain.      HPI  Past Medical History  Diagnosis Date  . Allergy   . Asthma   . Hypertension   . Prostate enlargement     Past Surgical History  Procedure Laterality Date  . Knee arthroscopy Left   . Vasectomy    . Hemorrhoid surgery    . Tonsillectomy    . Colonoscopy  2008    Dr Lemar Livings  . Appendectomy  1990    Dr Lemar Livings    Family History  Problem Relation Age of Onset  . Cancer Mother     ovarian  . Cancer Father     prostate  . Cancer - Prostate Paternal Uncle     Social History History  Substance Use Topics  . Smoking status: Never Smoker   . Smokeless tobacco: Never Used  . Alcohol Use: Yes     Comment: 3/week/wine    Allergies  Allergen Reactions  . Moxifloxacin Hives    Avelox  . Pseudoephedrine Other (See Comments)    Confusion, passed out, numbness  . Sulfonamide Derivatives Hives    Current Outpatient Prescriptions  Medication Sig Dispense Refill  . aliskiren (TEKTURNA) 300 MG tablet Take 300 mg by mouth daily.       Randa Ngo 30 MG/ACT SOLN daily.       . Azelastine-Fluticasone (DYMISTA) 137-50 MCG/ACT SUSP 2 (two) times daily. by Nasal route.      . Cholecalciferol (D 2000) 2000 UNITS TABS Take by mouth daily.       Marland Kitchen dexlansoprazole (DEXILANT) 60 MG capsule Take 60 mg by mouth daily.       Marland Kitchen dutasteride (AVODART) 0.5 MG capsule Take 0.5 mg by mouth daily.       Marland Kitchen EPINEPHrine (EPIPEN 2-PAK) 0.3 mg/0.3 mL IJ SOAJ injection 0.3 mg as needed.       . fexofenadine (ALLEGRA) 180 MG  tablet Take by mouth as needed.       Marland Kitchen ketotifen (ALAWAY) 0.025 % ophthalmic solution Place 1 drop into both eyes daily. 1 drop 2 (two) times daily.      . Multiple Vitamin (MULTI-VITAMINS) TABS Take by mouth daily.       . naproxen (NAPROSYN) 500 MG tablet Take 500 mg by mouth 2 (two) times daily with a meal.       . naproxen (NAPROSYN) 500 MG tablet       . Omega-3 Fatty Acids (FISH OIL) 1000 MG CAPS Take by mouth daily.       . ranitidine (ZANTAC) 300 MG capsule Take 300 mg by mouth every evening.       Marland Kitchen scopolamine (TRANSDERM-SCOP) 1 MG/3DAYS       . SYMBICORT 160-4.5 MCG/ACT inhaler Inhale 2 puffs into the lungs 2 (two) times daily.       . Tadalafil (CIALIS) 2.5 MG TABS Take by mouth daily.      . valsartan (DIOVAN) 320 MG tablet Take 320 mg by mouth daily.       Marland Kitchen  verapamil (CALAN-SR) 180 MG CR tablet Take 180 mg by mouth 2 (two) times daily.       . vitamin C (ASCORBIC ACID) 250 MG tablet Take 250 mg by mouth daily.       No current facility-administered medications for this visit.    Review of Systems Review of Systems  Constitutional: Negative.   Respiratory: Negative.   Cardiovascular: Negative.   Gastrointestinal: Negative for nausea, vomiting, diarrhea and constipation.    Blood pressure 160/82, pulse 88, resp. rate 14, height 6' (1.829 m), weight 205 lb (92.987 kg).  Physical Exam Physical Exam  Constitutional: He is oriented to person, place, and time. He appears well-developed and well-nourished.  Neck: Neck supple.  Cardiovascular: Normal rate, regular rhythm and normal heart sounds.   Pulmonary/Chest: Effort normal and breath sounds normal.  Abdominal: Soft. Normal appearance and bowel sounds are normal. There is no tenderness. A hernia is present.  1 cm fascial defect at umbilicus. Diastasis recti present.  Lymphadenopathy:    He has no cervical adenopathy.  Neurological: He is alert and oriented to person, place, and time.  Skin: Skin is warm and dry.     Data Reviewed Colonoscopy completed Jul 13, 2006 showed 2 benign polyps in the rectum. Pathology showed hyperplastic polyps. Hyperplastic lymphoid aggregate at the appendiceal orifice.  2008 exam showed a sebaceous cyst of the gluteal area.  PCP notes date October 11, 2013 were reviewed.  Assessment    Umbilical hernia.     Plan    Indication for elective repair were reviewed. The risks of surgery were discussed. It is unlikely that prosthetic mesh will be required. The roll for mesh reinforcement if the defect is significantly larger than clinically evident was discussed.     Hernia precautions and incarceration were discussed with the patient. If they develop symptoms of an incarcerated hernia, they were encouraged to seek prompt medical attention.  I have recommended repair of the hernia on an outpatient basis in the near future. The risk of infection was reviewed.    Patient's surgery has been scheduled for 01-20-14 at [facility]. This patient will not require a pre-op visit. He will be contacted prior to surgery to confirm no change in his clinical condition.  History and physical will be updated the morning of procedure.   PCP/Ref: Payton Mccallum 11/04/2013, 6:27 PM

## 2013-11-04 DIAGNOSIS — K429 Umbilical hernia without obstruction or gangrene: Secondary | ICD-10-CM | POA: Insufficient documentation

## 2014-01-10 ENCOUNTER — Other Ambulatory Visit: Payer: Self-pay | Admitting: General Surgery

## 2014-01-10 ENCOUNTER — Telehealth: Payer: Self-pay

## 2014-01-10 DIAGNOSIS — K429 Umbilical hernia without obstruction or gangrene: Secondary | ICD-10-CM

## 2014-01-10 NOTE — Telephone Encounter (Signed)
Message left for the patient to call back to review his medications and to make sure that he has not had any changes since last seen in the office. Surgery is scheduled for 01/20/14.

## 2014-01-12 ENCOUNTER — Telehealth: Payer: Self-pay | Admitting: *Deleted

## 2014-01-12 NOTE — Telephone Encounter (Signed)
Patient contacted today and he confirms no change in medications since last office visit.   We will proceed with umbilical hernia repair that is scheduled at [facility] for 01-20-14.

## 2014-01-13 ENCOUNTER — Telehealth: Payer: Self-pay | Admitting: General Surgery

## 2014-01-13 NOTE — Telephone Encounter (Signed)
01-13-14 PT CALLED AND IS HAVING SURGERYON Friday 01-20-14.HE SPOKE WITH THE ANESTHESIOLOGIST AND EXPLAINED HE GETS SICK AFTER SURGERY.THEY SUGGESTED HE USE THE PATCH FOR MOTION SICKNESS. HE USES THE [facility] ON [address] [place]. PLEASE CALL PT AND NOTIFY HIM WHEN SCRIPT HAS BEEN SENT [phone])

## 2014-01-18 ENCOUNTER — Telehealth: Payer: Self-pay | Admitting: *Deleted

## 2014-01-18 MED ORDER — SCOPOLAMINE 1 MG/3DAYS TD PT72: 1.0000 | MEDICATED_PATCH | TRANSDERMAL | Status: DC

## 2014-01-18 NOTE — Telephone Encounter (Signed)
RX sent for patch to use before surgery as requested.

## 2014-01-20 ENCOUNTER — Ambulatory Visit: Payer: Self-pay | Admitting: General Surgery

## 2014-01-20 DIAGNOSIS — K429 Umbilical hernia without obstruction or gangrene: Secondary | ICD-10-CM

## 2014-01-20 HISTORY — PX: HERNIA REPAIR: SHX51

## 2014-01-23 ENCOUNTER — Encounter: Payer: Self-pay | Admitting: General Surgery

## 2014-01-26 ENCOUNTER — Encounter: Payer: Self-pay | Admitting: General Surgery

## 2014-01-31 ENCOUNTER — Ambulatory Visit: Payer: BC Managed Care – PPO | Admitting: General Surgery

## 2014-02-07 ENCOUNTER — Encounter: Payer: Self-pay | Admitting: General Surgery

## 2014-02-07 ENCOUNTER — Ambulatory Visit (INDEPENDENT_AMBULATORY_CARE_PROVIDER_SITE_OTHER): Payer: Self-pay | Admitting: General Surgery

## 2014-02-07 VITALS — BP 140/78 | HR 80 | Resp 12 | Ht 72.0 in | Wt 208.0 lb

## 2014-02-07 DIAGNOSIS — K429 Umbilical hernia without obstruction or gangrene: Secondary | ICD-10-CM

## 2014-02-07 NOTE — Progress Notes (Signed)
Patient ID: [name] [name], male   DOB: 06/06/1955, 58 y.o.   MRN: [id number]  Chief Complaint  Patient presents with  . Routine Post Op    umbilical hernia    HPI [name] [name] is a 58 y.o. male here today for his post op umbilical hernia repair done on 01/20/14. Patient states he is doing well. Mild abdominal pain lateral abdominal sides. He did have some sharpe left abdominal pain bending over to pickup something off floor Saturday that lasted half of the day then resolved. Bowels moving normally.  Presently pain free.  HPI  Past Medical History  Diagnosis Date  . Allergy   . Asthma   . Hypertension   . Prostate enlargement     Past Surgical History  Procedure Laterality Date  . Knee arthroscopy Left   . Vasectomy    . Hemorrhoid surgery    . Tonsillectomy    . Colonoscopy  2008    Dr Lemar LivingsByrnett  . Appendectomy  1990    Dr Lemar LivingsByrnett  . Hernia repair  01/20/14    umbilical hernia    Family History  Problem Relation Age of Onset  . Cancer Mother     ovarian  . Cancer Father     prostate  . Cancer - Prostate Paternal Uncle     Social History History  Substance Use Topics  . Smoking status: Never Smoker   . Smokeless tobacco: Never Used  . Alcohol Use: Yes     Comment: 3/week/wine    Allergies  Allergen Reactions  . Moxifloxacin Hives    Avelox  . Pseudoephedrine Other (See Comments)    Confusion, passed out, numbness  . Sulfonamide Derivatives Hives    Current Outpatient Prescriptions  Medication Sig Dispense Refill  . aliskiren (TEKTURNA) 300 MG tablet Take 300 mg by mouth daily.     Randa Ngo. AXIRON 30 MG/ACT SOLN daily.     . Azelastine-Fluticasone (DYMISTA) 137-50 MCG/ACT SUSP 2 (two) times daily. by Nasal route.    . Cholecalciferol (D 2000) 2000 UNITS TABS Take by mouth daily.     Marland Kitchen. dexlansoprazole (DEXILANT) 60 MG capsule Take 60 mg by mouth daily.     Marland Kitchen. dutasteride (AVODART) 0.5 MG capsule Take 0.5 mg by mouth daily.     Marland Kitchen. EPINEPHrine  (EPIPEN 2-PAK) 0.3 mg/0.3 mL IJ SOAJ injection 0.3 mg as needed.     . fexofenadine (ALLEGRA) 180 MG tablet Take by mouth as needed.     Marland Kitchen. ketotifen (ALAWAY) 0.025 % ophthalmic solution Place 1 drop into both eyes daily. 1 drop 2 (two) times daily.    . Multiple Vitamin (MULTI-VITAMINS) TABS Take by mouth daily.     . naproxen (NAPROSYN) 500 MG tablet Take 500 mg by mouth 2 (two) times daily with a meal.     . naproxen (NAPROSYN) 500 MG tablet     . Omega-3 Fatty Acids (FISH OIL) 1000 MG CAPS Take by mouth daily.     . ranitidine (ZANTAC) 300 MG capsule Take 300 mg by mouth every evening.     Marland Kitchen. scopolamine (TRANSDERM-SCOP) 1 MG/3DAYS     . scopolamine (TRANSDERM-SCOP) 1 MG/3DAYS Place 1 patch (1.5 mg total) onto the skin every 3 (three) days. 4 patch 0  . SYMBICORT 160-4.5 MCG/ACT inhaler Inhale 2 puffs into the lungs 2 (two) times daily.     . Tadalafil (CIALIS) 2.5 MG TABS Take by mouth daily.    . valsartan (DIOVAN) 320 MG tablet Take  320 mg by mouth daily.     . verapamil (CALAN-SR) 180 MG CR tablet Take 180 mg by mouth 2 (two) times daily.     . vitamin C (ASCORBIC ACID) 250 MG tablet Take 250 mg by mouth daily.     No current facility-administered medications for this visit.    Review of Systems Review of Systems  Gastrointestinal: Negative for nausea, vomiting, diarrhea and constipation.    Blood pressure 140/78, pulse 80, resp. rate 12, height 6' (1.829 m), weight 208 lb (94.348 kg).  Physical Exam Physical Exam  Constitutional: He is oriented to person, place, and time. He appears well-developed and well-nourished.  Pulmonary/Chest: Effort normal and breath sounds normal.  Abdominal: Soft. Normal appearance.    Neurological: He is alert and oriented to person, place, and time.  Skin: Skin is warm and dry.       Assessment    Doing well status post umbilical hernia repair.    Plan    Care with strenuous activity/lifting was encouraged.       Follow up as  needed.  Increase activity as tolerated, may resume swimming.  PCP:  Payton MccallumGilbert, Richard  Lizmary Nader W 02/08/2014, 9:11 PM

## 2014-02-07 NOTE — Patient Instructions (Signed)
Increase activity as tolerated, may resume swimming.

## 2014-02-17 ENCOUNTER — Emergency Department: Payer: Self-pay | Admitting: Emergency Medicine

## 2014-03-09 ENCOUNTER — Encounter: Payer: Self-pay | Admitting: General Surgery

## 2014-03-14 ENCOUNTER — Encounter: Payer: Self-pay | Admitting: General Surgery

## 2014-07-01 NOTE — Op Note (Signed)
PATIENT NAME:  Precious HawsFULKERSON, [name] [name] MR#:  [id number] DATE OF BIRTH:  21-Mar-1955  DATE OF PROCEDURE:  01/20/2014  PREOPERATIVE DIAGNOSIS: Umbilical hernia.   POSTOPERATIVE DIAGNOSIS: Umbilical hernia.  OPERATIVE PROCEDURE: Repair of umbilical hernia.   SURGEON: Donnalee CurryJeffrey Raylynne Cubbage, MD   ANESTHESIA: General by LMA under Dr. Dimple Caseyice, Marcaine 0.5% plain, 30 mL local infiltration, Toradol 30 mg.   ESTIMATED BLOOD LOSS: Minimal.   CLINICAL NOTE: This 59 year old male has developed a symptomatic umbilical hernia. He was admitted for elective repair.   OPERATIVE NOTE: With the patient under adequate general anesthesia and hair previously removed with clippers, the abdomen was prepped with ChloraPrep and draped. An infraumbilical incision was made and carried down through the skin and subcutaneous tissue after local anesthetic had been injected. Hemostasis was with electrocautery. The hernia sac was excised and discarded. The undersurface of the fascia was clear. The fascial defect was approximately 2 cm in diameter. Interrupted 0 Surgilon simple sutures were used to close the defect. The umbilical skin was tacked to the fascia with 3-0 Vicryl after the instillation of Toradol for postoperative analgesia. The adipose layer was closed with a running 3-0 Vicryl and the skin closed with a running 4-0 Vicryl subcuticular suture. Benzoin and Steri-Strips followed by Telfa and Tegaderm dressing was applied.   The patient tolerated the procedure well and was taken to the recovery room in stable condition. ____________________________ Earline MayotteJeffrey W. Rosaria Kubin, MD jwb:sb [name]: 01/20/2014 09:49:03 ET T: 01/20/2014 10:41:49 ET JOB#: [id number]  cc: Earline MayotteJeffrey W. Cashus Halterman, MD, <Dictator> Richard L. Sullivan LoneGilbert, MD Jaris Kohles Brion AlimentW Marilu Rylander MD ELECTRONICALLY SIGNED 01/23/2014 18:33

## 2014-08-22 ENCOUNTER — Encounter: Payer: Self-pay | Admitting: Family Medicine

## 2014-08-22 ENCOUNTER — Ambulatory Visit (INDEPENDENT_AMBULATORY_CARE_PROVIDER_SITE_OTHER): Payer: BLUE CROSS/BLUE SHIELD | Admitting: Family Medicine

## 2014-08-22 VITALS — BP 140/72 | HR 76 | Temp 98.1°F | Resp 16 | Wt 206.0 lb

## 2014-08-22 DIAGNOSIS — K219 Gastro-esophageal reflux disease without esophagitis: Secondary | ICD-10-CM | POA: Diagnosis not present

## 2014-08-22 DIAGNOSIS — I10 Essential (primary) hypertension: Secondary | ICD-10-CM | POA: Diagnosis not present

## 2014-08-22 DIAGNOSIS — Z7189 Other specified counseling: Secondary | ICD-10-CM

## 2014-08-22 DIAGNOSIS — Z7184 Encounter for health counseling related to travel: Secondary | ICD-10-CM

## 2014-08-22 MED ORDER — AZITHROMYCIN 250 MG PO TABS: ORAL_TABLET | ORAL | Status: DC

## 2014-08-22 MED ORDER — PROMETHAZINE HCL 25 MG PO TABS: 25.0000 mg | ORAL_TABLET | Freq: Three times a day (TID) | ORAL | Status: DC | PRN

## 2014-08-22 NOTE — Progress Notes (Signed)
Patient ID: [name] [name], male   DOB: 08-25-1955, 59 y.o.   MRN: [id number]   [name] [name]  MRN: [id number] DOB: 1955/10/21  Subjective:  Hypertension This is a chronic (On the last visit the patinet was instructed to discontinue his Tekturna.   He has been  checking his BP outside of the office and  his readings have been stable and improving.\) problem. The problem is controlled. Pertinent negatives include no anxiety, blurred vision, chest pain, headaches, malaise/fatigue, neck pain, orthopnea, palpitations, peripheral edema, PND, shortness of breath or sweats.  Gastrophageal Reflux He complains of coughing (Could also be associated with his asthma) and a hoarse voice (Patient also has asthma and this may be associated with that illness). He reports no abdominal pain, no belching, no chest pain, no choking, no dysphagia, no early satiety, no heartburn (controlled with the Dexilant), no nausea, no sore throat, no stridor or no wheezing. Patient was instructed on the last visit to try decreasing his dose of Dexilant to every other day, he tried this and developed increase in his symptoms and therefore has gone back to taking it daily.     Bronchitis: Patient was seen by Dr. [place] Callas last week and was treated for Bronchitis.  He was given Rx for Prednisone taper, which he has finished and also given Clarithromycin.  He states his symptoms are improved.   Travel Medications: The patient will be leaving next week for a 10 trip to [place] and was inquiring about any medication he should take for preventative or emergent measures.    Patient Active Problem List   Diagnosis Date Noted  . Umbilical hernia without obstruction and without gangrene 11/04/2013  . ASTHMATIC BRONCHITIS, ACUTE 03/25/2007  . HYPERTENSION, SEVERE 03/15/2007  . RHINITIS 03/15/2007  . ASTHMA 03/15/2007  . ACID REFLUX DISEASE 03/15/2007  . COUGH, CHRONIC 03/15/2007    Past Medical History  Diagnosis Date   . Allergy   . Asthma   . Hypertension   . Prostate enlargement     History   Social History  . Marital Status: Married    Spouse Name: N/A  . Number of Children: N/A  . Years of Education: N/A   Occupational History  . Not on file.   Social History Main Topics  . Smoking status: Never Smoker   . Smokeless tobacco: Never Used     Comment: Exposed to second hand smoke as a child  . Alcohol Use: 0.0 oz/week    0 Standard drinks or equivalent per week     Comment: 3/week/wine  . Drug Use: No  . Sexual Activity: Not on file   Other Topics Concern  . Not on file   Social History Narrative    Outpatient Prescriptions Prior to Visit  Medication Sig Dispense Refill  . AXIRON 30 MG/ACT SOLN daily.     . Azelastine-Fluticasone (DYMISTA) 137-50 MCG/ACT SUSP 2 (two) times daily. by Nasal route.    . Cholecalciferol (D 2000) 2000 UNITS TABS Take by mouth daily.     Marland Kitchen dexlansoprazole (DEXILANT) 60 MG capsule Take 60 mg by mouth daily.     Marland Kitchen dutasteride (AVODART) 0.5 MG capsule Take 0.5 mg by mouth daily.     Marland Kitchen EPINEPHrine (EPIPEN 2-PAK) 0.3 mg/0.3 mL IJ SOAJ injection 0.3 mg as needed.     Marland Kitchen ketotifen (ALAWAY) 0.025 % ophthalmic solution Place 1 drop into both eyes daily. 1 drop 2 (two) times daily.    . Multiple Vitamin (MULTI-VITAMINS) TABS  Take by mouth daily.     . naproxen (NAPROSYN) 500 MG tablet Take 500 mg by mouth 2 (two) times daily with a meal.     . Omega-3 Fatty Acids (FISH OIL) 1000 MG CAPS Take by mouth daily.     . ranitidine (ZANTAC) 300 MG capsule Take 300 mg by mouth every evening.     . SYMBICORT 160-4.5 MCG/ACT inhaler Inhale 2 puffs into the lungs 2 (two) times daily.     . Tadalafil 2.5 MG TABS Take by mouth.    . tamsulosin (FLOMAX) 0.4 MG CAPS capsule Take by mouth.    . valsartan (DIOVAN) 320 MG tablet Take 320 mg by mouth daily.     . verapamil (CALAN-SR) 180 MG CR tablet Take 180 mg by mouth 2 (two) times daily.     . vitamin C (ASCORBIC ACID) 250 MG  tablet Take 250 mg by mouth daily.    Marland Kitchen aliskiren (TEKTURNA) 300 MG tablet Take 300 mg by mouth daily.     Marland Kitchen ascorbic acid (VITAMIN C) 250 MG tablet Take by mouth.    . Azelastine-Fluticasone 137-50 MCG/ACT SUSP Place into the nose.    . Cholecalciferol (VITAMIN D3) 2000 UNITS TABS Take by mouth.    . dexlansoprazole (DEXILANT) 60 MG capsule Take by mouth.    . dutasteride (AVODART) 0.5 MG capsule Take by mouth.    . fexofenadine (ALLEGRA) 180 MG tablet Take by mouth as needed.     Marland Kitchen ketotifen (ZADITOR) 0.025 % ophthalmic solution Apply to eye.    . MULTIPLE VITAMIN PO Take by mouth.    . naproxen (NAPROSYN) 500 MG tablet     . naproxen (NAPROSYN) 500 MG tablet Take by mouth.    . OMEGA-3 FATTY ACIDS PO Take by mouth.    . ranitidine (ZANTAC) 300 MG tablet Take by mouth.    Marland Kitchen scopolamine (TRANSDERM-SCOP) 1 MG/3DAYS     . scopolamine (TRANSDERM-SCOP) 1 MG/3DAYS Place 1 patch (1.5 mg total) onto the skin every 3 (three) days. 4 patch 0  . Tadalafil (CIALIS) 2.5 MG TABS Take by mouth daily.    . Testosterone 30 MG/ACT SOLN Place onto the skin.    . valsartan (DIOVAN) 320 MG tablet Take by mouth.    . verapamil (CALAN-SR) 180 MG CR tablet Take by mouth.     No facility-administered medications prior to visit.    Allergies  Allergen Reactions  . Moxifloxacin Hives    Avelox  . Pseudoephedrine Other (See Comments)    Confusion, passed out, numbness  . Sulfa Antibiotics Hives  . Sulfonamide Derivatives Hives    Review of Systems  Constitutional: Negative for fever, chills and malaise/fatigue.  HENT: Positive for hoarse voice (Patient also has asthma and this may be associated with that illness). Negative for sore throat.   Eyes: Negative for blurred vision.  Respiratory: Positive for cough (Could also be associated with his asthma) and sputum production. Negative for choking, shortness of breath and wheezing.   Cardiovascular: Negative for chest pain, palpitations, orthopnea, leg  swelling and PND.  Gastrointestinal: Negative.  Negative for heartburn (controlled with the Dexilant), dysphagia, nausea and abdominal pain.  Musculoskeletal: Negative for neck pain.  Neurological: Negative.  Negative for headaches.  Endo/Heme/Allergies: Negative.   Psychiatric/Behavioral: Negative.    Objective:  BP 140/72 mmHg  Pulse 76  Temp(Src) 98.1 F (36.7 C) (Oral)  Resp 16  Wt 206 lb (93.441 kg)  Physical Exam  Constitutional: He is oriented  to person, place, and time and well-developed, well-nourished, and in no distress.  HENT:  Head: Normocephalic and atraumatic.  Right Ear: External ear normal.  Left Ear: External ear normal.  Eyes: Conjunctivae and EOM are normal. Pupils are equal, round, and reactive to light.  Neck: Normal range of motion. Neck supple.  Cardiovascular: Normal rate, regular rhythm and normal heart sounds.   Pulmonary/Chest: Effort normal and breath sounds normal.  Abdominal: Soft. Bowel sounds are normal.  Neurological: He is oriented to person, place, and time. Gait normal.  Skin: Skin is warm and dry.  Psychiatric: Mood, memory, affect and judgment normal.    Assessment and Plan :   1. Essential hypertension Stable, continue current treatment  2. Gastroesophageal reflux disease, esophagitis presence not specified Stable, continue Dexilant daily.   Julieanne Manson MD [facility] 08/22/2014 8:49 AM

## 2014-08-27 ENCOUNTER — Encounter: Payer: Self-pay | Admitting: Family Medicine

## 2014-09-12 ENCOUNTER — Ambulatory Visit (INDEPENDENT_AMBULATORY_CARE_PROVIDER_SITE_OTHER): Payer: BLUE CROSS/BLUE SHIELD | Admitting: Urology

## 2014-09-12 ENCOUNTER — Encounter: Payer: Self-pay | Admitting: Urology

## 2014-09-12 VITALS — BP 153/82 | HR 84 | Temp 97.4°F | Ht 73.0 in | Wt 203.0 lb

## 2014-09-12 DIAGNOSIS — N400 Enlarged prostate without lower urinary tract symptoms: Secondary | ICD-10-CM | POA: Diagnosis not present

## 2014-09-12 LAB — URINALYSIS, COMPLETE
BILIRUBIN UA: NEGATIVE
GLUCOSE, UA: NEGATIVE
Ketones, UA: NEGATIVE
Leukocytes, UA: NEGATIVE
NITRITE UA: NEGATIVE
Protein, UA: NEGATIVE
RBC, UA: NEGATIVE
Specific Gravity, UA: <1.005 — ABNORMAL LOW (ref 1.005–1.030)
UUROB: 0.2 mg/dL (ref 0.2–1.0)
pH, UA: 6.5 (ref 5.0–7.5)

## 2014-09-12 LAB — MICROSCOPIC EXAMINATION
Bacteria, UA: NONE SEEN
RBC MICROSCOPIC, UA: NONE SEEN /HPF (ref 0–?)

## 2014-09-12 LAB — BLADDER SCAN AMB NON-IMAGING

## 2014-09-12 NOTE — Progress Notes (Signed)
09/12/2014 10:31 AM   [name] [name] Feb 07, 1956 [id number]  Referring provider: Maple Hudson., MD [address] [place], [place] [address]  Chief Complaint  Patient presents with  . Hypogonadism    6 mpnth f/u    HPI: Patient is doing exceptionally well with his voiding since we've initiated a combination of devascularized and tamsulosin. If he stops with tamsulosin he has a marketed change in his ability to void well. He is happy with his sex life at this point. The plan is to see him in a year as he is doing so well with his voiding that he does not want to come in for another year he does not need renewal of his medications at this time. He will need a serum testosterone and a PSA at this time as he has not had them for   PMH: Past Medical History  Diagnosis Date  . Allergy   . Asthma   . Hypertension   . Prostate enlargement     Surgical History: Past Surgical History  Procedure Laterality Date  . Knee arthroscopy Left   . Vasectomy    . Hemorrhoid surgery    . Tonsillectomy    . Colonoscopy  2008    Dr Lemar Livings  . Appendectomy  1990    Dr Lemar Livings  . Hernia repair  01/20/14    umbilical hernia  . Knee surgery Left 1986    Home Medications:    Medication List       This list is accurate as of: 09/12/14 10:31 AM.  Always use your most recent med list.               ALAWAY 0.025 % ophthalmic solution  Generic drug:  ketotifen  Place 1 drop into both eyes daily. 1 drop 2 (two) times daily.     AXIRON 30 MG/ACT Soln  Generic drug:  Testosterone  daily.     D 2000 2000 UNITS Tabs  Generic drug:  Cholecalciferol  Take by mouth daily.     dexlansoprazole 60 MG capsule  Commonly known as:  DEXILANT  Take 60 mg by mouth daily.     DYMISTA 137-50 MCG/ACT Susp  Generic drug:  Azelastine-Fluticasone  2 (two) times daily. by Nasal route.     Fish Oil 1000 MG Caps  Take by mouth daily.     MULTI-VITAMINS Tabs  Take by mouth  daily.     naproxen 500 MG tablet  Commonly known as:  NAPROSYN  Take 500 mg by mouth 2 (two) times daily with a meal.     ranitidine 300 MG capsule  Commonly known as:  ZANTAC  Take 300 mg by mouth every evening.     Tadalafil 2.5 MG Tabs  Take by mouth.     tamsulosin 0.4 MG Caps capsule  Commonly known as:  FLOMAX  Take by mouth.     valsartan 320 MG tablet  Commonly known as:  DIOVAN  Take 320 mg by mouth daily.     verapamil 180 MG CR tablet  Commonly known as:  CALAN-SR  Take 180 mg by mouth 2 (two) times daily.     vitamin C 250 MG tablet  Commonly known as:  ASCORBIC ACID  Take 250 mg by mouth daily.        Allergies:  Allergies  Allergen Reactions  . Moxifloxacin Hives    Avelox  . Pseudoephedrine Other (See Comments)    Confusion, passed  out, numbness  . Sulfa Antibiotics Hives  . Sulfonamide Derivatives Hives    Family History: Family History  Problem Relation Age of Onset  . Cancer Mother     ovarian  . Glaucoma Mother   . Cancer Father     prostate  . Cancer - Prostate Paternal Uncle   . Hypertension Sister   . Cancer Maternal Uncle     lung; two mat uncles  . Cancer Paternal Grandmother     breast  . Asthma Sister   . Alcohol abuse Paternal Uncle   . Liver disease Paternal Uncle   . Cancer Paternal Aunt     lung cancer in a smoker    Social History:  reports that he has never smoked. He has never used smokeless tobacco. He reports that he drinks alcohol. He reports that he does not use illicit drugs.  UJW:[id number]:UROLOGY Frequent Urination?: No Hard to postpone urination?: No Burning/pain with urination?: No Get up at night to urinate?: No Leakage of urine?: No Urine stream starts and stops?: No Trouble starting stream?: No Do you have to strain to urinate?: No Blood in urine?: No Urinary tract infection?: No Sexually transmitted disease?: No Injury to kidneys or bladder?: No Painful intercourse?: No Weak stream?: No Erection  problems?: No Penile pain?: No Gastrointestinal Nausea?: No Vomiting?: No Indigestion/heartburn?: No Diarrhea?: No Constipation?: No Constitutional Fever: No Night sweats?: No Weight loss?: No Fatigue?: No Skin Skin rash/lesions?: No Itching?: No Eyes Blurred vision?: No Double vision?: No Ears/Nose/Throat Sore throat?: No Sinus problems?: No Hematologic/Lymphatic Swollen glands?: No Easy bruising?: No Cardiovascular Leg swelling?: No Chest pain?: No Respiratory Cough?: No Shortness of breath?: No Endocrine Excessive thirst?: No Psychologic Depression?: No Anxiety?: No He has foot swelling on the right side from a fracture of his foot however his leg is not swollen and he has ambulation with crutches      Physical Exam: BP 153/82 mmHg  Pulse 84  Temp(Src) 97.4 F (36.3 C) (Oral)  Ht 6\' 1"  (1.854 m)  Wt 203 lb (92.08 kg)  BMI 26.79 kg/m2  Constitutional:  Alert and oriented, No acute distress. HEENT: [place] AT, moist mucus membranes.  Trachea midline, no masses. Cardiovascular: No clubbing, cyanosis, or edema. Respiratory: Normal respiratory effort, no increased work of breathing. GI: Abdomen is soft, nontender, nondistended, no abdominal masses GU: No CVA tenderness. Prostate small and no rectal masses penis and testes normal scrotum normal Skin: No rashes, bruises or suspicious lesions. Lymph: No cervical or inguinal adenopathy. Neurologic: Grossly intact, no focal deficits, moving all 4 extremities. Psychiatric: Normal mood and affect.  Laboratory Data: No results found for: WBC, HGB, HCT, MCV, PLT  No results found for: CREATININE  No results found for: PSA  No results found for: TESTOSTERONE  No results found for: HGBA1C  Urinalysis No results found for: COLORURINE, APPEARANCEUR, LABSPEC, PHURINE, GLUCOSEU, HGBUR, BILIRUBINUR, KETONESUR, PROTEINUR, UROBILINOGEN, NITRITE, LEUKOCYTESUR  Pertinent Imaging: No pertinent imaging  Assessment &  Plan:  Continue medication for sexual dysfunction and benign prostatic hypertrophy  1. BPH (benign prostatic hyperplasia) Sexual dysfunction - Urinalysis, Complete - BLADDER SCAN AMB NON-IMAGING   No Follow-up on file.  Lorraine Laxichard D Marizol Borror, MD  [facility] [address], [address] [place], [place] [address] [phone]   he

## 2014-09-13 LAB — PSA: Prostate Specific Ag, Serum: 0.6 ng/mL (ref 0.0–4.0)

## 2014-09-13 LAB — TESTOSTERONE: TESTOSTERONE: 445 ng/dL (ref 348–1197)

## 2014-09-21 ENCOUNTER — Ambulatory Visit: Payer: Self-pay | Admitting: Family Medicine

## 2014-10-23 ENCOUNTER — Other Ambulatory Visit: Payer: Self-pay | Admitting: Urology

## 2014-11-07 ENCOUNTER — Ambulatory Visit (INDEPENDENT_AMBULATORY_CARE_PROVIDER_SITE_OTHER): Payer: BLUE CROSS/BLUE SHIELD | Admitting: Family Medicine

## 2014-11-07 ENCOUNTER — Encounter: Payer: Self-pay | Admitting: Family Medicine

## 2014-11-07 VITALS — BP 148/82 | HR 78 | Temp 98.3°F | Resp 14 | Ht 72.0 in | Wt 205.0 lb

## 2014-11-07 DIAGNOSIS — Z8781 Personal history of (healed) traumatic fracture: Secondary | ICD-10-CM

## 2014-11-07 DIAGNOSIS — Q792 Exomphalos: Secondary | ICD-10-CM | POA: Insufficient documentation

## 2014-11-07 DIAGNOSIS — I10 Essential (primary) hypertension: Secondary | ICD-10-CM | POA: Insufficient documentation

## 2014-11-07 DIAGNOSIS — G4733 Obstructive sleep apnea (adult) (pediatric): Secondary | ICD-10-CM | POA: Insufficient documentation

## 2014-11-07 DIAGNOSIS — K219 Gastro-esophageal reflux disease without esophagitis: Secondary | ICD-10-CM | POA: Insufficient documentation

## 2014-11-07 DIAGNOSIS — N401 Enlarged prostate with lower urinary tract symptoms: Secondary | ICD-10-CM | POA: Insufficient documentation

## 2014-11-07 DIAGNOSIS — J45909 Unspecified asthma, uncomplicated: Secondary | ICD-10-CM | POA: Insufficient documentation

## 2014-11-07 DIAGNOSIS — N529 Male erectile dysfunction, unspecified: Secondary | ICD-10-CM | POA: Insufficient documentation

## 2014-11-07 DIAGNOSIS — M25549 Pain in joints of unspecified hand: Secondary | ICD-10-CM | POA: Insufficient documentation

## 2014-11-07 DIAGNOSIS — M722 Plantar fascial fibromatosis: Secondary | ICD-10-CM | POA: Insufficient documentation

## 2014-11-07 DIAGNOSIS — J309 Allergic rhinitis, unspecified: Secondary | ICD-10-CM | POA: Insufficient documentation

## 2014-11-07 DIAGNOSIS — E291 Testicular hypofunction: Secondary | ICD-10-CM | POA: Insufficient documentation

## 2014-11-07 DIAGNOSIS — Z0000 Encounter for general adult medical examination without abnormal findings: Secondary | ICD-10-CM

## 2014-11-07 HISTORY — DX: Essential (primary) hypertension: I10

## 2014-11-07 NOTE — Progress Notes (Signed)
Patient ID: [name] [name], male   DOB: 1955-09-08, 59 y.o.   MRN: [id number]  Visit Date: 11/07/2014  Today's Provider: Megan Mans, MD   Chief Complaint  Patient presents with  . Annual Exam   Subjective:  [name] [name] is a 59 y.o. male who presents today for health maintenance and complete physical. He feels fairly well. He reports exercising not right now due to right foot fracture. He reports he is sleeping well.  Patient gets PSA checked with urologist and this was done about a month ago.  LAST:  EKG-01/09/11  Endoscopy-07/02/10 no need to repeat EGD  Colonoscopy-07/13/06 5 mm polyp.  Review of Systems  Constitutional: Negative.   HENT: Negative.   Eyes: Negative.   Respiratory: Negative.   Cardiovascular: Negative.   Gastrointestinal: Negative.   Endocrine: Negative.   Genitourinary: Negative.   Musculoskeletal: Negative.   Skin: Negative.   Allergic/Immunologic: Positive for environmental allergies.  Neurological: Negative.   Hematological: Negative.   Psychiatric/Behavioral: Negative.     Social History   Social History  . Marital Status: Married    Spouse Name: N/A  . Number of Children: N/A  . Years of Education: N/A   Occupational History  . Not on file.   Social History Main Topics  . Smoking status: Never Smoker   . Smokeless tobacco: Never Used     Comment: Exposed to second hand smoke as a child  . Alcohol Use: 0.0 oz/week    0 Standard drinks or equivalent per week     Comment: 3/week/wine  . Drug Use: No  . Sexual Activity: Not on file   Other Topics Concern  . Not on file   Social History Narrative    Patient Active Problem List   Diagnosis Date Noted  . Allergic rhinitis 11/07/2014  . Arthralgia of hand 11/07/2014  . Airway hyperreactivity 11/07/2014  . Benign fibroma of prostate 11/07/2014  . Impotence of organic origin 11/07/2014  . Acid reflux 11/07/2014  . BP (high blood pressure) 11/07/2014  .  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 Obstructive apnea 11/07/2014  . Plantar fasciitis 11/07/2014  . 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 Umbilical hernia without obstruction and without gangrene 11/04/2013  . Testicular hypofunction 10/26/2013  . ASTHMATIC BRONCHITIS, ACUTE 03/25/2007  . HYPERTENSION, SEVERE 03/15/2007  . RHINITIS 03/15/2007  . ASTHMA 03/15/2007  . ACID REFLUX DISEASE 03/15/2007  . COUGH, CHRONIC 03/15/2007    Past Surgical History  Procedure Laterality Date  . Knee arthroscopy Left   . Vasectomy    . Hemorrhoid surgery    . Tonsillectomy    . Colonoscopy  2008    Dr Lemar Livings  . Appendectomy  1990    Dr Lemar Livings  . Hernia repair  01/20/14    umbilical hernia  . Knee surgery Left 1986    His family history includes Alcohol abuse in his paternal uncle; Asthma in his sister; Cancer in his father, maternal uncle, mother, paternal aunt, and paternal grandmother; Cancer - Prostate in his paternal uncle; Glaucoma in his mother; Hypertension in his sister; Liver disease in his paternal uncle.    Outpatient Prescriptions Prior to Visit  Medication Sig Dispense Refill  . AXIRON 30 MG/ACT SOLN APPLY TO BOTH AXILLA EVERY DAY 150 mL 0  . Azelastine-Fluticasone (DYMISTA) 137-50 MCG/ACT SUSP 2 (two) times daily. by Nasal route.    . Cholecalciferol (D 2000) 2000 UNITS TABS Take by mouth daily.     Marland Kitchen dexlansoprazole (DEXILANT) 60 MG capsule Take  60 mg by mouth daily.     Marland Kitchen ketotifen (ALAWAY) 0.025 % ophthalmic solution Place 1 drop into both eyes daily. 1 drop 2 (two) times daily.    . Multiple Vitamin (MULTI-VITAMINS) TABS Take by mouth daily.     . naproxen (NAPROSYN) 500 MG tablet Take 500 mg by mouth 2 (two) times daily with a meal.     . Omega-3 Fatty Acids (FISH OIL) 1000 MG CAPS Take by mouth daily.     . ranitidine (ZANTAC) 300 MG capsule Take 300 mg by mouth every evening.     . Tadalafil 2.5 MG TABS Take by mouth.    . tamsulosin (FLOMAX) 0.4 MG CAPS capsule Take by mouth.    .  valsartan (DIOVAN) 320 MG tablet Take 320 mg by mouth daily.     . verapamil (CALAN-SR) 180 MG CR tablet Take 180 mg by mouth 2 (two) times daily.     . vitamin C (ASCORBIC ACID) 250 MG tablet Take 250 mg by mouth daily.     No facility-administered medications prior to visit.    Patient Care Team: Maple Hudson., MD as PCP - General (Family Medicine) Maple Hudson., MD (Family Medicine) Earline Mayotte, MD (General Surgery)     Objective:   Vitals:  Filed Vitals:   11/07/14 0933  BP: 148/82  Pulse: 78  Temp: 98.3 F (36.8 C)  Resp: 14  Height: 6' (1.829 m)  Weight: 205 lb (92.987 kg)    Physical Exam  Constitutional: He is oriented to person, place, and time. He appears well-developed and well-nourished.  Truncal obesity.  HENT:  Head: Normocephalic and atraumatic.  Right Ear: External ear normal.  Left Ear: External ear normal.  Nose: Nose normal.  Mouth/Throat: Oropharynx is clear and moist.  Eyes: Conjunctivae are normal. Pupils are equal, round, and reactive to light.  Neck: Neck supple.  Cardiovascular: Normal rate, regular rhythm, normal heart sounds and intact distal pulses.   Pulmonary/Chest: Effort normal and breath sounds normal.  Abdominal: Bowel sounds are normal.  Musculoskeletal: Normal range of motion.  Neurological: He is alert and oriented to person, place, and time.  Skin: Skin is warm and dry.  Psychiatric: He has a normal mood and affect. His behavior is normal. Judgment and thought content normal.     Depression Screen No flowsheet data found.    Assessment & Plan:    CPE/Hea lth Maintenance  Immunization History  Administered Date(s) Administered  . Pneumococcal Polysaccharide-23 09/04/1998  . Td 11/15/2003  . Tdap 10/11/2013             BMD PT WITH HYPOGONADISM AND HJAS HAD 3 FRACTURES           [email or web address] No evaluation or treatment for now          Colonic Polyp- Dr Lemar Livings to repeat 2018            OSA Wears CPAP.

## 2014-11-08 LAB — POCT URINALYSIS DIPSTICK
Bilirubin, UA: NEGATIVE
Glucose, UA: NEGATIVE
KETONES UA: NEGATIVE
Leukocytes, UA: NEGATIVE
Nitrite, UA: NEGATIVE
PROTEIN UA: NEGATIVE
RBC UA: NEGATIVE
SPEC GRAV UA: 1.015
Urobilinogen, UA: NEGATIVE
pH, UA: 5

## 2014-11-09 ENCOUNTER — Other Ambulatory Visit: Payer: Self-pay | Admitting: Family Medicine

## 2014-11-16 ENCOUNTER — Ambulatory Visit
Admission: RE | Admit: 2014-11-16 | Discharge: 2014-11-16 | Disposition: A | Discharge status: Home / Self Care | Payer: BLUE CROSS/BLUE SHIELD | Source: Ambulatory Visit | Attending: Family Medicine | Admitting: Family Medicine

## 2014-11-16 DIAGNOSIS — M8580 Other specified disorders of bone density and structure, unspecified site: Secondary | ICD-10-CM | POA: Insufficient documentation

## 2014-11-16 DIAGNOSIS — Z8781 Personal history of (healed) traumatic fracture: Secondary | ICD-10-CM | POA: Diagnosis present

## 2014-11-16 DIAGNOSIS — Z13820 Encounter for screening for osteoporosis: Secondary | ICD-10-CM | POA: Diagnosis not present

## 2014-11-22 ENCOUNTER — Telehealth: Payer: Self-pay

## 2014-11-22 NOTE — Telephone Encounter (Signed)
Please review patient's BMD results. Patient asking for this. Thank you-aa

## 2014-11-24 NOTE — Telephone Encounter (Signed)
Some bone loss, osteopenic but not osteoporotic. Calcium twice a day and walk daily. Repeat BMD 2-4 years

## 2014-11-26 ENCOUNTER — Other Ambulatory Visit: Payer: Self-pay | Admitting: Urology

## 2014-11-27 NOTE — Telephone Encounter (Signed)
LMTCB ED 

## 2014-11-27 NOTE — Telephone Encounter (Signed)
Advised  ED 

## 2014-11-30 ENCOUNTER — Encounter: Payer: Self-pay | Admitting: Family Medicine

## 2014-12-21 ENCOUNTER — Other Ambulatory Visit: Payer: Self-pay | Admitting: Allergy

## 2014-12-21 ENCOUNTER — Other Ambulatory Visit: Payer: Self-pay | Admitting: Urology

## 2014-12-21 ENCOUNTER — Ambulatory Visit
Admission: RE | Admit: 2014-12-21 | Discharge: 2014-12-21 | Disposition: A | Discharge status: Home / Self Care | Payer: BLUE CROSS/BLUE SHIELD | Source: Ambulatory Visit | Attending: Allergy | Admitting: Allergy

## 2014-12-21 DIAGNOSIS — J209 Acute bronchitis, unspecified: Secondary | ICD-10-CM

## 2014-12-21 DIAGNOSIS — J329 Chronic sinusitis, unspecified: Secondary | ICD-10-CM

## 2014-12-26 ENCOUNTER — Ambulatory Visit
Admission: RE | Admit: 2014-12-26 | Discharge: 2014-12-26 | Disposition: A | Discharge status: Home / Self Care | Payer: BLUE CROSS/BLUE SHIELD | Source: Ambulatory Visit | Attending: Allergy | Admitting: Allergy

## 2014-12-26 ENCOUNTER — Other Ambulatory Visit: Payer: Self-pay | Admitting: Urology

## 2014-12-26 DIAGNOSIS — J329 Chronic sinusitis, unspecified: Secondary | ICD-10-CM | POA: Diagnosis present

## 2014-12-27 ENCOUNTER — Encounter: Payer: Self-pay | Admitting: Family Medicine

## 2014-12-28 NOTE — Telephone Encounter (Signed)
Spoke with pt in reference to Axiron and needing an appt. Pt was not happy but asked to be transferred to the front to make appt.

## 2014-12-28 NOTE — Telephone Encounter (Signed)
Called to make pt aware of no refills on axiron. Pt stated he had an appt 7mo ago and has a f/u scheduled for 3 mo. Please advise.

## 2014-12-28 NOTE — Telephone Encounter (Signed)
Patient called this morning.  Needs a new prescription for Axiron with 3 month supply sent to [facility] on [address]   (Pharmacy stated that the original prescription was not written with the correct dosage to last 3 months.)

## 2014-12-28 NOTE — Telephone Encounter (Signed)
Patient is an office visit prior to refilling his medication.

## 2015-01-10 ENCOUNTER — Ambulatory Visit (INDEPENDENT_AMBULATORY_CARE_PROVIDER_SITE_OTHER): Payer: BLUE CROSS/BLUE SHIELD | Admitting: Urology

## 2015-01-10 ENCOUNTER — Encounter: Payer: Self-pay | Admitting: Urology

## 2015-01-10 VITALS — BP 162/81 | HR 90 | Ht 72.0 in | Wt 212.7 lb

## 2015-01-10 DIAGNOSIS — E291 Testicular hypofunction: Secondary | ICD-10-CM

## 2015-01-10 DIAGNOSIS — N400 Enlarged prostate without lower urinary tract symptoms: Secondary | ICD-10-CM

## 2015-01-10 LAB — URINALYSIS, COMPLETE
BILIRUBIN UA: NEGATIVE
GLUCOSE, UA: NEGATIVE
KETONES UA: NEGATIVE
LEUKOCYTES UA: NEGATIVE
NITRITE UA: NEGATIVE
Protein, UA: NEGATIVE
RBC UA: NEGATIVE
SPEC GRAV UA: 1.01 (ref 1.005–1.030)
Urobilinogen, Ur: 0.2 mg/dL (ref 0.2–1.0)
pH, UA: 7 (ref 5.0–7.5)

## 2015-01-10 LAB — BLADDER SCAN AMB NON-IMAGING: Scan Result: 34

## 2015-01-10 LAB — MICROSCOPIC EXAMINATION
BACTERIA UA: NONE SEEN
Epithelial Cells (non renal): NONE SEEN /hpf (ref 0–10)
RBC MICROSCOPIC, UA: NONE SEEN /HPF (ref 0–?)
RENAL EPITHEL UA: NONE SEEN /HPF
WBC, UA: NONE SEEN /hpf (ref 0–?)

## 2015-01-10 MED ORDER — TESTOSTERONE 30 MG/ACT TD SOLN: TRANSDERMAL | Status: DC

## 2015-01-10 MED ORDER — TESTOSTERONE 30 MG/ACT TD SOLN: 30.0000 mg | Freq: Once | TRANSDERMAL | Status: DC

## 2015-01-10 MED ORDER — TADALAFIL 5 MG PO TABS: 5.0000 mg | ORAL_TABLET | Freq: Every day | ORAL | Status: DC | PRN

## 2015-01-10 NOTE — Progress Notes (Signed)
01/10/2015 9:18 AM   [name] [name] Aug 03, 1955 [id number]  Referring provider: Maple Hudson., MD [address] [place], [place] [address]  Chief Complaint  Patient presents with  . Benign Prostatic Hypertrophy    HPI: Patient has had recent severe bronchitis has been on prednisone for this bronchitis. This caused him some difficulties with his urination. In addition E was unable to obtain his  Prescriptions of Cialis and Axiron from pharmacy. He did not have a very sympathetic encounter with his pharmacist. So I straightened out his doses of Axiron Cialis. Relatively happy with his 5 mg Cialis 60 mg Daily. Follow-up in 6 months.   PMH: Past Medical History  Diagnosis Date  . Allergy   . Asthma   . Hypertension   . Prostate enlargement   . HYPERTENSION, SEVERE 03/15/2007    Qualifier: Diagnosis of  By: Vernie Murders    . BP (high blood pressure) 11/07/2014    Should stop combination of Tekturna (DRI)  and ARB     Surgical History: Past Surgical History  Procedure Laterality Date  . Knee arthroscopy Left   . Vasectomy    . Hemorrhoid surgery    . Tonsillectomy    . Colonoscopy  2008    Dr Lemar Livings  . Appendectomy  1990    Dr Lemar Livings  . Hernia repair  01/20/14    umbilical hernia  . Knee surgery Left 1986    Home Medications:    Medication List       This list is accurate as of: 01/10/15  9:18 AM.  Always use your most recent med list.               ALAWAY 0.025 % ophthalmic solution  Generic drug:  ketotifen  Place 1 drop into both eyes daily. 1 drop 2 (two) times daily.     albuterol (2.5 MG/3ML) 0.083% nebulizer solution  Commonly known as:  PROVENTIL  USE 1 VIAL THREE TIMES A DAY AS NEEDED COUGH/WHEEZE MUST LAST 30 DAYS PER MD     calcium citrate 950 MG tablet  Commonly known as:  CALCITRATE - dosed in mg elemental calcium  Take 200 mg of elemental calcium by mouth daily.     D 2000 2000 UNITS Tabs  Generic drug:   Cholecalciferol  Take by mouth daily.     dexlansoprazole 60 MG capsule  Commonly known as:  DEXILANT  Take 60 mg by mouth daily.     dutasteride 0.5 MG capsule  Commonly known as:  AVODART  Take 0.5 mg by mouth daily.     DYMISTA 137-50 MCG/ACT Susp  Generic drug:  Azelastine-Fluticasone  2 (two) times daily. by Nasal route.     Fish Oil 1000 MG Caps  Take by mouth daily.     MULTI-VITAMINS Tabs  Take by mouth daily.     naproxen 500 MG tablet  Commonly known as:  NAPROSYN  TAKE 1 TABLET BY MOUTH TWICE A DAY AS NEEDED FOR PAIN     ranitidine 300 MG capsule  Commonly known as:  ZANTAC  Take 300 mg by mouth every evening.     ranitidine 300 MG tablet  Commonly known as:  ZANTAC  Take 300 mg by mouth at bedtime.     SYMBICORT 160-4.5 MCG/ACT inhaler  Generic drug:  budesonide-formoterol  Inhale 2 puffs into the lungs 2 (two) times daily.     tadalafil 5 MG tablet  Commonly known as:  CIALIS  Take 1 tablet (5 mg total) by mouth daily as needed for erectile dysfunction.     tamsulosin 0.4 MG Caps capsule  Commonly known as:  FLOMAX  Take by mouth.     Testosterone 30 MG/ACT Soln  Commonly known as:  AXIRON  One swipe per axilla daily     Testosterone 30 MG/ACT Soln  Commonly known as:  AXIRON  Apply 30 mg topically once.     valsartan 320 MG tablet  Commonly known as:  DIOVAN  Take 320 mg by mouth daily.     verapamil 180 MG CR tablet  Commonly known as:  CALAN-SR  Take 180 mg by mouth 2 (two) times daily.     vitamin C 250 MG tablet  Commonly known as:  ASCORBIC ACID  Take 250 mg by mouth daily.        Allergies:  Allergies  Allergen Reactions  . Moxifloxacin Hives    Avelox  . Pseudoephedrine Other (See Comments)    Confusion, passed out, numbness  . Pseudoephedrine Hcl     Other reaction(s): Syncope  . Sulfa Antibiotics Hives  . Sulfonamide Derivatives Hives    Family History: Family History  Problem Relation Age of Onset  . Cancer  Mother     ovarian  . Glaucoma Mother   . Cancer Father     prostate  . Cancer - Prostate Paternal Uncle   . Hypertension Sister   . Cancer Maternal Uncle     lung; two mat uncles  . Cancer Paternal Grandmother     breast  . Asthma Sister   . Alcohol abuse Paternal Uncle   . Liver disease Paternal Uncle   . Cancer Paternal Aunt     lung cancer in a smoker    Social History:  reports that he has never smoked. He has never used smokeless tobacco. He reports that he drinks alcohol. He reports that he does not use illicit drugs.  ROS: UROLOGY Frequent Urination?: Yes Hard to postpone urination?: No Burning/pain with urination?: No Get up at night to urinate?: Yes Leakage of urine?: No Urine stream starts and stops?: No Trouble starting stream?: Yes Do you have to strain to urinate?: No Blood in urine?: No Urinary tract infection?: No Sexually transmitted disease?: No Injury to kidneys or bladder?: No Painful intercourse?: No Weak stream?: No Erection problems?: Yes Penile pain?: No  Gastrointestinal Nausea?: No Vomiting?: No Indigestion/heartburn?: No Diarrhea?: No Constipation?: No  Constitutional Fever: No Night sweats?: No Weight loss?: No Fatigue?: Yes  Skin Skin rash/lesions?: No Itching?: No  Eyes Blurred vision?: No Double vision?: No  Ears/Nose/Throat Sore throat?: No Sinus problems?: No  Hematologic/Lymphatic Swollen glands?: No Easy bruising?: Yes  Cardiovascular Leg swelling?: No Chest pain?: No  Respiratory Cough?: No Shortness of breath?: No  Endocrine Excessive thirst?: No  Musculoskeletal Back pain?: No Joint pain?: Yes  Neurological Headaches?: No Dizziness?: No  Psychologic Depression?: No Anxiety?: No  Physical Exam: BP 162/81 mmHg  Pulse 90  Ht 6' (1.829 m)  Wt 212 lb 11.2 oz (96.48 kg)  BMI 28.84 kg/m2  Constitutional:  Alert and oriented, No acute distress. HEENT: [place] AT, moist mucus membranes.  Trachea  midline, no masses. Cardiovascular: No clubbing, cyanosis, or edema. Respiratory: Normal respiratory effort, no increased work of breathing. GI: Abdomen is soft, nontender, nondistended, no abdominal masses GU: No CVA tenderness. Prostate small nonnodular good rectal sphincter tone Skin: No rashes, bruises or suspicious lesions. Lymph: No cervical  or inguinal adenopathy. Neurologic: Grossly intact, no focal deficits, moving all 4 extremities. Psychiatric: Normal mood and affect.  Laboratory Data: No results found for: WBC, HGB, HCT, MCV, PLT  No results found for: CREATININE  Lab Results  Component Value Date   PSA 0.6 09/12/2014    Lab Results  Component Value Date   TESTOSTERONE 445 09/12/2014    No results found for: HGBA1C  Urinalysis    Component Value Date/Time   GLUCOSEU Negative 09/12/2014 1005   BILIRUBINUR negative 11/08/2014 1134   BILIRUBINUR Negative 09/12/2014 1005   PROTEINUR negative 11/08/2014 1134   UROBILINOGEN negative 11/08/2014 1134   NITRITE negative 11/08/2014 1134   NITRITE Negative 09/12/2014 1005   LEUKOCYTESUR Negative 11/08/2014 1134   LEUKOCYTESUR Negative 09/12/2014 1005    Pertinent Imaging: None  Assessment & Plan:  Because of a technical problem with his extra dosing patient has not been able to utilize Axiron FOR 2 months and to renew his extra prescription for Cialis 5 mg. I have made sure he will not default 2 is 2.5 mg dose. Because he been off his Axiron 2 months I will not do any blood work today. Lab testing be done next visit should be for a CBC testosterone liver function tests and PSA  1. Benign fibroma of prostate   2. BPH (benign prostatic hyperplasia) doing well voiding utilizing Cialis 5 mg daily  - Urinalysis, Complete - Bladder Scan (Post Void Residual) in office - tadalafil (CIALIS) 5 MG tablet; Take 1 tablet (5 mg total) by mouth daily as needed for erectile dysfunction.  Dispense: 30 tablet; Refill: 0  3.  Hypogonadism in male  - Testosterone (AXIRON) 30 MG/ACT SOLN; Apply 30 mg topically once.  Dispense: 150 mL; Refill: 0 - tadalafil (CIALIS) 5 MG tablet; Take 1 tablet (5 mg total) by mouth daily as needed for erectile dysfunction.  Dispense: 30 tablet; Refill: 0   No Follow-up on file.  Lorraine Laxichard D Hart, MD  [facility] [address], [address] [place], [place] [address] [phone]

## 2015-01-10 NOTE — Progress Notes (Signed)
Bladder Scan Patient } void: 34 ml Performed By: Theotis BurrowK.[name],CMA

## 2015-01-19 ENCOUNTER — Other Ambulatory Visit: Payer: Self-pay | Admitting: Family Medicine

## 2015-02-18 ENCOUNTER — Other Ambulatory Visit: Payer: Self-pay | Admitting: Urology

## 2015-02-23 ENCOUNTER — Other Ambulatory Visit: Payer: Self-pay | Admitting: Urology

## 2015-02-23 ENCOUNTER — Other Ambulatory Visit: Payer: Self-pay

## 2015-02-23 DIAGNOSIS — N400 Enlarged prostate without lower urinary tract symptoms: Secondary | ICD-10-CM

## 2015-02-23 DIAGNOSIS — E291 Testicular hypofunction: Secondary | ICD-10-CM

## 2015-02-23 MED ORDER — TADALAFIL 5 MG PO TABS: 5.0000 mg | ORAL_TABLET | Freq: Every day | ORAL | Status: DC | PRN

## 2015-03-14 ENCOUNTER — Other Ambulatory Visit: Payer: BLUE CROSS/BLUE SHIELD

## 2015-03-14 DIAGNOSIS — N400 Enlarged prostate without lower urinary tract symptoms: Secondary | ICD-10-CM

## 2015-03-14 DIAGNOSIS — E291 Testicular hypofunction: Secondary | ICD-10-CM

## 2015-03-15 ENCOUNTER — Telehealth: Payer: Self-pay

## 2015-03-15 LAB — CBC WITH DIFFERENTIAL/PLATELET
BASOS: 0 %
Basophils Absolute: 0 10*3/uL (ref 0.0–0.2)
EOS (ABSOLUTE): 0.1 10*3/uL (ref 0.0–0.4)
EOS: 2 %
HEMATOCRIT: 42.8 % (ref 37.5–51.0)
Hemoglobin: 14.4 g/dL (ref 12.6–17.7)
IMMATURE GRANS (ABS): 0 10*3/uL (ref 0.0–0.1)
IMMATURE GRANULOCYTES: 0 %
Lymphocytes Absolute: 1.4 10*3/uL (ref 0.7–3.1)
Lymphs: 29 %
MCH: 32.2 pg (ref 26.6–33.0)
MCHC: 33.6 g/dL (ref 31.5–35.7)
MCV: 96 fL (ref 79–97)
MONOS ABS: 0.4 10*3/uL (ref 0.1–0.9)
Monocytes: 8 %
NEUTROS PCT: 61 %
Neutrophils Absolute: 3.1 10*3/uL (ref 1.4–7.0)
Platelets: 191 10*3/uL (ref 150–379)
RBC: 4.47 x10E6/uL (ref 4.14–5.80)
RDW: 13.6 % (ref 12.3–15.4)
WBC: 5 10*3/uL (ref 3.4–10.8)

## 2015-03-15 LAB — TESTOSTERONE: TESTOSTERONE: 724 ng/dL (ref 348–1197)

## 2015-03-15 LAB — HEPATIC FUNCTION PANEL
ALBUMIN: 4.3 g/dL (ref 3.5–5.5)
ALK PHOS: 67 IU/L (ref 39–117)
ALT: 21 IU/L (ref 0–44)
AST: 19 IU/L (ref 0–40)
BILIRUBIN TOTAL: 0.6 mg/dL (ref 0.0–1.2)
Bilirubin, Direct: 0.17 mg/dL (ref 0.00–0.40)
TOTAL PROTEIN: 6 g/dL (ref 6.0–8.5)

## 2015-03-15 LAB — PSA: Prostate Specific Ag, Serum: 0.7 ng/mL (ref 0.0–4.0)

## 2015-03-15 NOTE — Telephone Encounter (Signed)
Spoke with pt in reference to bi-annual office visits. Pt voiced understanding. Pt was transferred to the front to make f/u appt.

## 2015-03-15 NOTE — Telephone Encounter (Signed)
-----   Message from Harle BattiestShannon A McGowan, PA-C sent at 03/15/2015  8:11 AM EST ----- Patient's labs are good, but he needs to be seen on a bi-annual basis.  He will have to move his appointment up to April because he was last seen by Dr. Edwyna ShellHart in November.  He wants to see Dr. Sherryl BartersBudzyn.

## 2015-03-15 NOTE — Telephone Encounter (Signed)
LMOM

## 2015-03-24 ENCOUNTER — Other Ambulatory Visit: Payer: Self-pay | Admitting: Urology

## 2015-03-24 DIAGNOSIS — N400 Enlarged prostate without lower urinary tract symptoms: Secondary | ICD-10-CM

## 2015-04-28 ENCOUNTER — Other Ambulatory Visit: Payer: Self-pay | Admitting: Family Medicine

## 2015-05-02 ENCOUNTER — Ambulatory Visit (INDEPENDENT_AMBULATORY_CARE_PROVIDER_SITE_OTHER): Payer: BLUE CROSS/BLUE SHIELD | Admitting: Family Medicine

## 2015-05-02 ENCOUNTER — Encounter: Payer: Self-pay | Admitting: Family Medicine

## 2015-05-02 VITALS — BP 144/92 | HR 72 | Temp 98.0°F | Resp 12 | Wt 219.0 lb

## 2015-05-02 DIAGNOSIS — I10 Essential (primary) hypertension: Secondary | ICD-10-CM | POA: Diagnosis not present

## 2015-05-02 DIAGNOSIS — R198 Other specified symptoms and signs involving the digestive system and abdomen: Secondary | ICD-10-CM | POA: Diagnosis not present

## 2015-05-02 DIAGNOSIS — K6289 Other specified diseases of anus and rectum: Secondary | ICD-10-CM

## 2015-05-02 NOTE — Progress Notes (Signed)
Patient ID: [name] [name], male   DOB: [birth year], 60 y.o.   MRN: [id number]    Subjective:  HPI  Patient states that on Saturday February 18th he was in the shower and felt a lump in the rectal area, on the right side, it is located internally a little and externally. It hurts a little in that area. No blood seen so far. His bowels have been harder but he is using stool softners. He states that 9 years ago Dr. Lemar Livings had to remove a lump like that, he is not sure what the name of this was but felt like the one he has now.  Prior to Admission medications   Medication Sig Start Date End Date Taking? Authorizing Provider  albuterol (PROVENTIL) (2.5 MG/3ML) 0.083% nebulizer solution USE 1 VIAL THREE TIMES A DAY AS NEEDED COUGH/WHEEZE MUST LAST 30 DAYS PER MD 12/07/14  Yes Historical Provider, MD  Azelastine-Fluticasone (DYMISTA) 137-50 MCG/ACT SUSP 2 (two) times daily. by Nasal route.   Yes Historical Provider, MD  budesonide-formoterol (SYMBICORT) 160-4.5 MCG/ACT inhaler Inhale 2 puffs into the lungs 2 (two) times daily.   Yes Historical Provider, MD  calcium citrate (CALCITRATE - DOSED IN MG ELEMENTAL CALCIUM) 950 MG tablet Take 200 mg of elemental calcium by mouth daily.   Yes Historical Provider, MD  Cholecalciferol (D 2000) 2000 UNITS TABS Take by mouth daily.  09/29/12  Yes Historical Provider, MD  CIALIS 5 MG tablet TAKE 1 TABLET (5 MG TOTAL) BY MOUTH DAILY AS NEEDED FOR ERECTILE DYSFUNCTION. 03/26/15  Yes Harle Battiest, PA-C  DEXILANT 60 MG capsule TAKE 1 CAPSULE EVERY DAY 04/30/15  Yes Maple Hudson., MD  dutasteride (AVODART) 0.5 MG capsule Take 0.5 mg by mouth daily.   Yes Historical Provider, MD  ketotifen (ALAWAY) 0.025 % ophthalmic solution Place 1 drop into both eyes daily. 1 drop 2 (two) times daily.   Yes Historical Provider, MD  Multiple Vitamin (MULTI-VITAMINS) TABS Take by mouth daily.    Yes Historical Provider, MD  naproxen (NAPROSYN) 500 MG tablet TAKE 1 TABLET  BY MOUTH TWICE A DAY AS NEEDED FOR PAIN 11/09/14  Yes Jenson Beedle Hulen Shouts., MD  Omega-3 Fatty Acids (FISH OIL) 1000 MG CAPS Take by mouth daily.    Yes Historical Provider, MD  ranitidine (ZANTAC) 300 MG capsule Take 300 mg by mouth every evening.    Yes Historical Provider, MD  ranitidine (ZANTAC) 300 MG tablet Take 300 mg by mouth at bedtime. 01/06/15  Yes Historical Provider, MD  tamsulosin (FLOMAX) 0.4 MG CAPS capsule Take by mouth. 08/02/14  Yes Historical Provider, MD  Testosterone Randa Ngo) 30 MG/ACT SOLN One swipe per axilla daily 01/10/15  Yes Lorraine Lax, MD  Testosterone (AXIRON) 30 MG/ACT SOLN Apply 30 mg topically once. 01/10/15  Yes Lorraine Lax, MD  valsartan (DIOVAN) 320 MG tablet Take 320 mg by mouth daily.    Yes Historical Provider, MD  verapamil (CALAN-SR) 180 MG CR tablet TAKE 1 TABLET BY MOUTH TWO TIMES A DAY 01/19/15  Yes Rachal Dvorsky Hulen Shouts., MD  vitamin C (ASCORBIC ACID) 250 MG tablet Take 250 mg by mouth daily.   Yes Historical Provider, MD    Patient Active Problem List   Diagnosis Date Noted  . Allergic rhinitis 11/07/2014  . Arthralgia of hand 11/07/2014  . Airway hyperreactivity 11/07/2014  . Benign fibroma of prostate 11/07/2014  . Impotence of organic origin 11/07/2014  . Acid reflux 11/07/2014  . BP (high  blood pressure) 11/07/2014  . 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 Obstructive apnea 11/07/2014  . Plantar fasciitis 11/07/2014  . 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 Umbilical hernia without obstruction and without gangrene 11/04/2013  . Testicular hypofunction 10/26/2013  . Cellulitis 10/26/2013  . Essential (primary) hypertension 10/26/2013  . Other specified counseling 10/26/2013  . Encounter for screening for other viral diseases 09/29/2012  . ASTHMATIC BRONCHITIS, ACUTE 03/25/2007  . HYPERTENSION, SEVERE 03/15/2007  . RHINITIS 03/15/2007  . ASTHMA 03/15/2007  . ACID REFLUX DISEASE 03/15/2007  . COUGH, CHRONIC 03/15/2007     Past Medical History  Diagnosis Date  . Allergy   . Asthma   . Hypertension   . Prostate enlargement   . HYPERTENSION, SEVERE 03/15/2007    Qualifier: Diagnosis of  By: Vernie Murders    . BP (high blood pressure) 11/07/2014    Should stop combination of Tekturna (DRI)  and ARB     Social History   Social History  . Marital Status: Married    Spouse Name: N/A  . Number of Children: N/A  . Years of Education: N/A   Occupational History  . Not on file.   Social History Main Topics  . Smoking status: Never Smoker   . Smokeless tobacco: Never Used     Comment: Exposed to second hand smoke as a child  . Alcohol Use: 0.0 oz/week    0 Standard drinks or equivalent per week     Comment: 3/week/wine  . Drug Use: No  . Sexual Activity: Not on file   Other Topics Concern  . Not on file   Social History Narrative    Allergies  Allergen Reactions  . Moxifloxacin Hives    Avelox  . Pseudoephedrine Other (See Comments)    Confusion, passed out, numbness  . Pseudoephedrine Hcl     Other reaction(s): Syncope  . Sulfa Antibiotics Hives  . Sulfonamide Derivatives Hives    Review of Systems  Constitutional: Negative.   Respiratory: Negative.   Cardiovascular: Negative.   Gastrointestinal: Positive for constipation.  Musculoskeletal: Negative.     Immunization History  Administered Date(s) Administered  . Influenza,inj,Quad PF,36+ Mos 11/28/2014  . Pneumococcal Polysaccharide-23 09/04/1998  . Td 11/15/2003  . Tdap 10/11/2013   Objective:  BP 144/92 mmHg  Pulse 72  Temp(Src) 98 F (36.7 C)  Resp 12  Wt 219 lb (99.338 kg)  Physical Exam .rlgi Genitourinary: Rectum normal.  2 cm x 1 cm right-sided renal mass that nontender and easily mobile.  Skin: Skin is warm and dry.  Psychiatric: Mood, memory, affect and judgment normal.    Lab Results  Component Value Date   WBC 5.0 03/14/2015   HCT 42.8 03/14/2015   PLT 191 03/14/2015    CMP     Component  Value Date/Time   PROT 6.0 03/14/2015 0825   ALBUMIN 4.3 03/14/2015 0825   AST 19 03/14/2015 0825   ALT 21 03/14/2015 0825   ALKPHOS 67 03/14/2015 0825   BILITOT 0.6 03/14/2015 0825    Assessment and Plan :  1. Mass of anus Most likely epidermal inclusion cyst like he had in 2008. Refer back to Dr. Lemar Livings. - Ambulatory referral to General Surgery  2. Essential hypertension Stable.   Julieanne Manson MD [facility] 05/02/2015 3:26 PM

## 2015-05-09 ENCOUNTER — Ambulatory Visit: Payer: BLUE CROSS/BLUE SHIELD | Admitting: Family Medicine

## 2015-05-10 ENCOUNTER — Ambulatory Visit (INDEPENDENT_AMBULATORY_CARE_PROVIDER_SITE_OTHER): Payer: BLUE CROSS/BLUE SHIELD | Admitting: General Surgery

## 2015-05-10 ENCOUNTER — Encounter: Payer: Self-pay | Admitting: General Surgery

## 2015-05-10 VITALS — BP 162/86 | HR 80 | Resp 14 | Ht 71.0 in | Wt 215.0 lb

## 2015-05-10 DIAGNOSIS — K645 Perianal venous thrombosis: Secondary | ICD-10-CM | POA: Insufficient documentation

## 2015-05-10 MED ORDER — HYDROCODONE-ACETAMINOPHEN 5-325 MG PO TABS: 1.0000 | ORAL_TABLET | ORAL | Status: DC | PRN

## 2015-05-10 NOTE — Patient Instructions (Addendum)
The patient is aware to call back for any questions or concerns. Avoid constipation

## 2015-05-10 NOTE — Progress Notes (Signed)
Patient ID: [name] [name], male   DOB: [birth year], 60 y.o.   MRN: [id number]  Chief Complaint  Patient presents with  . Rectal Problems    mass    HPI [name] [name] is a 60 y.o. male.  Here today for evaluation of an anal mass that he noticed a few weeks ago. He states it is painful to sit for long periods of time. Denies rectal bleeding. Bowels move daily and are regular. He states it is similar to the knot/bump he had 9 years ago. He states you removed an area with his colonoscopy 9 years ago.  The patient reports that the swelling became evident after bout of constipation in early February.  I personally reviewed the patient's history.  HPI  Past Medical History  Diagnosis Date  . Allergy   . Asthma   . Hypertension   . Prostate enlargement   . HYPERTENSION, SEVERE 03/15/2007    Qualifier: Diagnosis of  By: Vernie Murders    . BP (high blood pressure) 11/07/2014    Should stop combination of Tekturna (DRI)  and ARB     Past Surgical History  Procedure Laterality Date  . Knee arthroscopy Left   . Vasectomy    . Hemorrhoid surgery    . Tonsillectomy    . Colonoscopy  2008    Dr Lemar Livings  . Appendectomy  1990    Dr Lemar Livings  . Hernia repair  01/20/14    umbilical hernia  . Knee surgery Left 1986    Family History  Problem Relation Age of Onset  . Cancer Mother     ovarian  . Glaucoma Mother   . Cancer Father     prostate  . Cancer - Prostate Paternal Uncle   . Hypertension Sister   . Cancer Maternal Uncle     lung; two mat uncles  . Cancer Paternal Grandmother     breast  . Asthma Sister   . Alcohol abuse Paternal Uncle   . Liver disease Paternal Uncle   . Cancer Paternal Aunt     lung cancer in a smoker    Social History Social History  Substance Use Topics  . Smoking status: Never Smoker   . Smokeless tobacco: Never Used     Comment: Exposed to second hand smoke as a child  . Alcohol Use: 0.0 oz/week    0 Standard drinks or  equivalent per week     Comment: 3/week/wine    Allergies  Allergen Reactions  . Moxifloxacin Hives    Avelox  . Pseudoephedrine Other (See Comments)    Confusion, passed out, numbness  . Pseudoephedrine Hcl     Other reaction(s): Syncope  . Sulfa Antibiotics Hives  . Sulfonamide Derivatives Hives    Current Outpatient Prescriptions  Medication Sig Dispense Refill  . Azelastine-Fluticasone (DYMISTA) 137-50 MCG/ACT SUSP 2 (two) times daily. by Nasal route.    . budesonide-formoterol (SYMBICORT) 160-4.5 MCG/ACT inhaler Inhale 2 puffs into the lungs 2 (two) times daily.    . calcium citrate (CALCITRATE - DOSED IN MG ELEMENTAL CALCIUM) 950 MG tablet Take 200 mg of elemental calcium by mouth daily.    . Cholecalciferol (D 2000) 2000 UNITS TABS Take by mouth daily.     Marland Kitchen CIALIS 5 MG tablet TAKE 1 TABLET (5 MG TOTAL) BY MOUTH DAILY AS NEEDED FOR ERECTILE DYSFUNCTION. 30 tablet 3  . DEXILANT 60 MG capsule TAKE 1 CAPSULE EVERY DAY 30 capsule 11  . dutasteride (AVODART) 0.5  MG capsule Take 0.5 mg by mouth daily.    Marland Kitchen ketotifen (ALAWAY) 0.025 % ophthalmic solution Place 1 drop into both eyes daily. 1 drop 2 (two) times daily.    . Multiple Vitamin (MULTI-VITAMINS) TABS Take by mouth daily.     . naproxen (NAPROSYN) 500 MG tablet TAKE 1 TABLET BY MOUTH TWICE A DAY AS NEEDED FOR PAIN 60 tablet 9  . Omega-3 Fatty Acids (FISH OIL) 1000 MG CAPS Take by mouth daily.     . ranitidine (ZANTAC) 300 MG capsule Take 300 mg by mouth every evening.     . tamsulosin (FLOMAX) 0.4 MG CAPS capsule Take by mouth.    . Testosterone (AXIRON) 30 MG/ACT SOLN One swipe per axilla daily 60 mL 5  . valsartan (DIOVAN) 320 MG tablet Take 320 mg by mouth daily.     . verapamil (CALAN-SR) 180 MG CR tablet TAKE 1 TABLET BY MOUTH TWO TIMES A DAY 60 tablet 12  . HYDROcodone-acetaminophen (NORCO) 5-325 MG tablet Take 1-2 tablets by mouth every 4 (four) hours as needed for moderate pain. 20 tablet 0   No current  facility-administered medications for this visit.    Review of Systems Review of Systems  Constitutional: Negative.   Respiratory: Negative.   Cardiovascular: Negative.   Gastrointestinal: Positive for rectal pain. Negative for diarrhea, constipation, blood in stool and anal bleeding.    Blood pressure 162/86, pulse 80, resp. rate 14, height  (1.803 m), weight 215 lb (97.523 kg).  Physical Exam Physical Exam  Genitourinary:       Data Reviewed May 2008 colonoscopy showed 2 hyperplastic polyps in the rectum and lymphoid hyperplasia at the appendiceal orifice. A sebaceous cyst of the gluteal area was removed at that time.  Assessment    Symptomatic thrombosed external hemorrhoid.    Plan    Options for observation versus incision and drainage were reviewed.  10 mL of 0.5% Xylocaine with 0.25% Marcaine with 1-200,000 epinephrine was utilized well tolerated. Betadine solution was applied to the skin. A radial incision was made at the 5:00 position and the clot extracted without incident. A dry Telfa pad was applied.  The patient was instructed regarding postoperative wound care. He is aware he may see a small amount of bleeding for the next 24-48 hours.  He'll follow-up in regards to the hemorrhoid as needed. Will plan for a colonoscopy in one year.    Follow up as needed.  PCP:  Julieanne Manson  This information has been scribed by Dorathy Daft RNBC.   Earline Mayotte 05/11/2015, 4:31 PM

## 2015-05-15 ENCOUNTER — Encounter: Payer: Self-pay | Admitting: General Surgery

## 2015-05-15 ENCOUNTER — Telehealth: Payer: Self-pay | Admitting: General Surgery

## 2015-05-15 NOTE — Telephone Encounter (Signed)
Please schedule when convenient for the patient.

## 2015-05-15 NOTE — Telephone Encounter (Signed)
From patient message:  ----- Message -----   From: [name] [name],[name] [name]   Sent: 05/15/2015 3:29 PM EST    To: [name], [name], MD  Subject: Visit Follow-Up Question   The original lump that Dr. Sullivan LoneGilbert referred me to Dr. Lemar LivingsByrnett for is still there, further up in the rectum. Dr. Lemar LivingsByrnett removed a blood clot that was larger and further out of the rectum. I'[name] like to schedule a follow up visit to check on this lump.   Thank you,  Marcheta GrammesChris Uemura       Should I go ahead and schedule another appointment for this patient?

## 2015-05-29 ENCOUNTER — Encounter: Payer: Self-pay | Admitting: General Surgery

## 2015-05-29 ENCOUNTER — Ambulatory Visit (INDEPENDENT_AMBULATORY_CARE_PROVIDER_SITE_OTHER): Payer: BLUE CROSS/BLUE SHIELD | Admitting: General Surgery

## 2015-05-29 VITALS — BP 162/88 | HR 86 | Resp 12 | Ht 71.0 in | Wt 212.0 lb

## 2015-05-29 DIAGNOSIS — K645 Perianal venous thrombosis: Secondary | ICD-10-CM | POA: Diagnosis not present

## 2015-05-29 NOTE — Patient Instructions (Signed)
The patient is aware to call back for any questions or concerns.  

## 2015-05-29 NOTE — Progress Notes (Signed)
Patient ID: [name] [name], male   DOB: 1955-12-09, 60 y.o.   MRN: [id number]  Chief Complaint  Patient presents with  . Follow-up    rectal lump     HPI [name] [name] is a 60 y.o. male here today for a  recheck a rectal lump, size of a "pea" that he had originally noticed at the end of February . He had a incision and drainage of a thrombosed hemorrhoid on 05/10/15 which he has completely recovered. This was a secondary lesion that the patient really had not been troubled by. Now that this is healed, the smaller area that initially prompted his visit became evident. Bowels move daily, no bleeding.   I personally reviewed the patient history.   HPI  Past Medical History  Diagnosis Date  . Allergy   . Asthma   . Hypertension   . Prostate enlargement   . HYPERTENSION, SEVERE 03/15/2007    Qualifier: Diagnosis of  By: Vernie Murders    . BP (high blood pressure) 11/07/2014    Should stop combination of Tekturna (DRI)  and ARB     Past Surgical History  Procedure Laterality Date  . Knee arthroscopy Left   . Vasectomy    . Hemorrhoid surgery    . Tonsillectomy    . Colonoscopy  2008    Dr Lemar Livings  . Appendectomy  1990    Dr Lemar Livings  . Hernia repair  01/20/14    umbilical hernia  . Knee surgery Left 1986    Family History  Problem Relation Age of Onset  . Cancer Mother     ovarian  . Glaucoma Mother   . Cancer Father     prostate  . Cancer - Prostate Paternal Uncle   . Hypertension Sister   . Cancer Maternal Uncle     lung; two mat uncles  . Cancer Paternal Grandmother     breast  . Asthma Sister   . Alcohol abuse Paternal Uncle   . Liver disease Paternal Uncle   . Cancer Paternal Aunt     lung cancer in a smoker    Social History Social History  Substance Use Topics  . Smoking status: Never Smoker   . Smokeless tobacco: Never Used     Comment: Exposed to second hand smoke as a child  . Alcohol Use: 0.0 oz/week    0 Standard drinks or  equivalent per week     Comment: 3/week/wine    Allergies  Allergen Reactions  . Moxifloxacin Hives    Avelox  . Pseudoephedrine Other (See Comments)    Confusion, passed out, numbness  . Pseudoephedrine Hcl     Other reaction(s): Syncope  . Sulfa Antibiotics Hives  . Sulfonamide Derivatives Hives    Current Outpatient Prescriptions  Medication Sig Dispense Refill  . Azelastine-Fluticasone (DYMISTA) 137-50 MCG/ACT SUSP 2 (two) times daily. by Nasal route.    . budesonide-formoterol (SYMBICORT) 160-4.5 MCG/ACT inhaler Inhale 2 puffs into the lungs 2 (two) times daily.    . calcium citrate (CALCITRATE - DOSED IN MG ELEMENTAL CALCIUM) 950 MG tablet Take 200 mg of elemental calcium by mouth daily.    . Cholecalciferol (D 2000) 2000 UNITS TABS Take by mouth daily.     Marland Kitchen CIALIS 5 MG tablet TAKE 1 TABLET (5 MG TOTAL) BY MOUTH DAILY AS NEEDED FOR ERECTILE DYSFUNCTION. 30 tablet 3  . DEXILANT 60 MG capsule TAKE 1 CAPSULE EVERY DAY 30 capsule 11  . dutasteride (AVODART) 0.5  MG capsule Take 0.5 mg by mouth daily.    Marland Kitchen. ketotifen (ALAWAY) 0.025 % ophthalmic solution Place 1 drop into both eyes daily. 1 drop 2 (two) times daily.    . Multiple Vitamin (MULTI-VITAMINS) TABS Take by mouth daily.     . naproxen (NAPROSYN) 500 MG tablet TAKE 1 TABLET BY MOUTH TWICE A DAY AS NEEDED FOR PAIN 60 tablet 9  . Omega-3 Fatty Acids (FISH OIL) 1000 MG CAPS Take by mouth daily.     . ranitidine (ZANTAC) 300 MG capsule Take 300 mg by mouth every evening.     . tamsulosin (FLOMAX) 0.4 MG CAPS capsule Take by mouth.    . Testosterone (AXIRON) 30 MG/ACT SOLN One swipe per axilla daily 60 mL 5  . valsartan (DIOVAN) 320 MG tablet Take 320 mg by mouth daily.     . verapamil (CALAN-SR) 180 MG CR tablet TAKE 1 TABLET BY MOUTH TWO TIMES A DAY 60 tablet 12   No current facility-administered medications for this visit.    Review of Systems Review of Systems  Blood pressure 162/88, pulse 86, resp. rate 12, height 5'  11" (1.803 m), weight 212 lb (96.163 kg).  Physical Exam Physical Exam  Constitutional: He is oriented to person, place, and time. He appears well-developed and well-nourished.  Genitourinary:     4 mm nodule thrombosed hemorrhoid  Neurological: He is alert and oriented to person, place, and time.  Skin: Skin is warm and dry.  Psychiatric: His behavior is normal.      Assessment    Persistent thrombosed external hemorrhoid.    Plan    Options for management: 1) sees observation versus 2) incision and drainage. The patient did well with his past I&D and decided to proceed in that direction. 5 mL of 0.5% Xylocaine with 0.25% Marcaine with 1-200,000 of epinephrine was utilized after a Betadine prep. A radial incision was made and the clot expressed and the associated vein segment removed. Scant bleeding was noted. The procedure was well tolerated. The patient is familiar with postoperative wound care. He'll follow up on an as-needed basis.    PCP:  Sullivan LoneGilbert  This information has been scribed by Ples SpecterJessica Qualls CMA.    [name], Jeryl Umholtz W 05/30/2015, 6:33 AM

## 2015-06-07 ENCOUNTER — Other Ambulatory Visit: Payer: Self-pay | Admitting: Family Medicine

## 2015-06-20 ENCOUNTER — Encounter: Payer: Self-pay | Admitting: Urology

## 2015-06-20 ENCOUNTER — Ambulatory Visit (INDEPENDENT_AMBULATORY_CARE_PROVIDER_SITE_OTHER): Payer: BLUE CROSS/BLUE SHIELD | Admitting: Urology

## 2015-06-20 VITALS — BP 150/80 | HR 82 | Ht 72.0 in | Wt 214.1 lb

## 2015-06-20 DIAGNOSIS — N400 Enlarged prostate without lower urinary tract symptoms: Secondary | ICD-10-CM | POA: Diagnosis not present

## 2015-06-20 DIAGNOSIS — E291 Testicular hypofunction: Secondary | ICD-10-CM | POA: Diagnosis not present

## 2015-06-20 LAB — MICROSCOPIC EXAMINATION
BACTERIA UA: NONE SEEN
Epithelial Cells (non renal): NONE SEEN /hpf (ref 0–10)
RBC, UA: NONE SEEN /hpf (ref 0–?)
WBC UA: NONE SEEN /HPF (ref 0–?)

## 2015-06-20 LAB — URINALYSIS, COMPLETE
BILIRUBIN UA: NEGATIVE
GLUCOSE, UA: NEGATIVE
KETONES UA: NEGATIVE
LEUKOCYTES UA: NEGATIVE
Nitrite, UA: NEGATIVE
PH UA: 7 (ref 5.0–7.5)
PROTEIN UA: NEGATIVE
RBC UA: NEGATIVE
SPEC GRAV UA: 1.015 (ref 1.005–1.030)
UUROB: 0.2 mg/dL (ref 0.2–1.0)

## 2015-06-20 NOTE — Progress Notes (Signed)
06/20/2015 8:52 AM   [name] [name] 12/25/1955 [id number]  Referring provider: Maple Hudsonichard L Gilbert Jr., MD [address] [place], [place] [id number]  Chief Complaint  Patient presents with  . Follow-up    BPH, hypogonadism    HPI: The patient is a 60 year old gentleman with a past medical history of BPH on Avodart and tamsulosin and hypogonadism on axiron presents for follow-up.The patient is doing well since his last seen. His I PSS score today is 8/1. He is very pleased with his urinary status. Has nocturia 1. He is 3 and frequency category but otherwise no significant symptoms.  He is also Axiron for hypogonadism. He is due for labs today. He feels like he has a good sex drive with no fatigue.   PMH: Past Medical History  Diagnosis Date  . Allergy   . Asthma   . Hypertension   . Prostate enlargement   . HYPERTENSION, SEVERE 03/15/2007    Qualifier: Diagnosis of  By: Vernie Murdersaskin, Leslie    . BP (high blood pressure) 11/07/2014    Should stop combination of Tekturna (DRI)  and ARB     Surgical History: Past Surgical History  Procedure Laterality Date  . Knee arthroscopy Left   . Vasectomy    . Hemorrhoid surgery    . Tonsillectomy    . Colonoscopy  2008    Dr Lemar LivingsByrnett  . Appendectomy  1990    Dr Lemar LivingsByrnett  . Hernia repair  01/20/14    umbilical hernia  . Knee surgery Left 1986    Home Medications:    Medication List       This list is accurate as of: 06/20/15  8:52 AM.  Always use your most recent med list.               ALAWAY 0.025 % ophthalmic solution  Generic drug:  ketotifen  Place 1 drop into both eyes daily. 1 drop 2 (two) times daily.     calcium citrate 950 MG tablet  Commonly known as:  CALCITRATE - dosed in mg elemental calcium  Take 200 mg of elemental calcium by mouth daily.     CIALIS 5 MG tablet  Generic drug:  tadalafil  TAKE 1 TABLET (5 MG TOTAL) BY MOUTH DAILY AS NEEDED FOR ERECTILE DYSFUNCTION.     D 2000 2000 units  Tabs  Generic drug:  Cholecalciferol  Take by mouth daily.     DEXILANT 60 MG capsule  Generic drug:  dexlansoprazole  TAKE 1 CAPSULE EVERY DAY     dutasteride 0.5 MG capsule  Commonly known as:  AVODART  Take 0.5 mg by mouth daily.     DYMISTA 137-50 MCG/ACT Susp  Generic drug:  Azelastine-Fluticasone  2 (two) times daily. by Nasal route.     Fish Oil 1000 MG Caps  Take by mouth daily.     montelukast 10 MG tablet  Commonly known as:  SINGULAIR  Take 10 mg by mouth every evening.     MULTI-VITAMINS Tabs  Take by mouth daily.     naproxen 500 MG tablet  Commonly known as:  NAPROSYN  TAKE 1 TABLET BY MOUTH TWICE A DAY AS NEEDED FOR PAIN     ranitidine 300 MG capsule  Commonly known as:  ZANTAC  Take 300 mg by mouth every evening.     SYMBICORT 160-4.5 MCG/ACT inhaler  Generic drug:  budesonide-formoterol  Inhale 2 puffs into the lungs 2 (two) times daily.  tamsulosin 0.4 MG Caps capsule  Commonly known as:  FLOMAX  Take by mouth.     Testosterone 30 MG/ACT Soln  Commonly known as:  AXIRON  One swipe per axilla daily     valsartan 320 MG tablet  Commonly known as:  DIOVAN  TAKE 1 TABLET EVERY DAY     verapamil 180 MG CR tablet  Commonly known as:  CALAN-SR  TAKE 1 TABLET BY MOUTH TWO TIMES A DAY        Allergies:  Allergies  Allergen Reactions  . Moxifloxacin Hives    Avelox  . Pseudoephedrine Other (See Comments)    Confusion, passed out, numbness  . Pseudoephedrine Hcl     Other reaction(s): Syncope  . Sulfa Antibiotics Hives  . Sulfonamide Derivatives Hives    Family History: Family History  Problem Relation Age of Onset  . Cancer Mother     ovarian  . Glaucoma Mother   . Cancer Father     prostate  . Cancer - Prostate Paternal Uncle   . Hypertension Sister   . Cancer Maternal Uncle     lung; two mat uncles  . Cancer Paternal Grandmother     breast  . Asthma Sister   . Alcohol abuse Paternal Uncle   . Liver disease Paternal  Uncle   . Cancer Paternal Aunt     lung cancer in a smoker    Social History:  reports that he has never smoked. He has never used smokeless tobacco. He reports that he drinks alcohol. He reports that he does not use illicit drugs.  ROS: UROLOGY Frequent Urination?: No Hard to postpone urination?: No Burning/pain with urination?: No Get up at night to urinate?: No Leakage of urine?: No Urine stream starts and stops?: No Trouble starting stream?: No Do you have to strain to urinate?: No Blood in urine?: No Urinary tract infection?: No Sexually transmitted disease?: No Injury to kidneys or bladder?: No Painful intercourse?: No Weak stream?: No Erection problems?: No Penile pain?: No  Gastrointestinal Nausea?: No Vomiting?: No Indigestion/heartburn?: No Diarrhea?: No Constipation?: No  Constitutional Fever: No Night sweats?: No Weight loss?: No Fatigue?: No  Skin Skin rash/lesions?: No Itching?: No  Eyes Blurred vision?: No Double vision?: No  Ears/Nose/Throat Sore throat?: No Sinus problems?: No  Hematologic/Lymphatic Swollen glands?: No Easy bruising?: No  Cardiovascular Leg swelling?: No Chest pain?: No  Respiratory Cough?: No Shortness of breath?: No  Endocrine Excessive thirst?: No  Musculoskeletal Back pain?: No Joint pain?: No  Neurological Headaches?: No Dizziness?: No  Psychologic Depression?: No Anxiety?: No  Physical Exam: BP 150/80 mmHg  Pulse 82  Ht 6' (1.829 m)  Wt 214 lb 1.6 oz (97.115 kg)  BMI 29.03 kg/m2  Constitutional:  Alert and oriented, No acute distress. HEENT: [place] AT, moist mucus membranes.  Trachea midline, no masses. Cardiovascular: No clubbing, cyanosis, or edema. Respiratory: Normal respiratory effort, no increased work of breathing. GI: Abdomen is soft, nontender, nondistended, no abdominal masses GU: No CVA tenderness. DRE: 2+ smooth no nodules. Benign. Skin: No rashes, bruises or suspicious  lesions. Lymph: No cervical or inguinal adenopathy. Neurologic: Grossly intact, no focal deficits, moving all 4 extremities. Psychiatric: Normal mood and affect.  Laboratory Data: Lab Results  Component Value Date   WBC 5.0 03/14/2015   HCT 42.8 03/14/2015   MCV 96 03/14/2015   PLT 191 03/14/2015    No results found for: CREATININE  No results found for: PSA  Lab Results  Component Value Date   TESTOSTERONE 724 03/14/2015    No results found for: HGBA1C  Urinalysis    Component Value Date/Time   APPEARANCEUR Clear 01/10/2015 0849   GLUCOSEU Negative 01/10/2015 0849   BILIRUBINUR Negative 01/10/2015 0849   BILIRUBINUR negative 11/08/2014 1134   PROTEINUR Negative 01/10/2015 0849   PROTEINUR negative 11/08/2014 1134   UROBILINOGEN negative 11/08/2014 1134   NITRITE Negative 01/10/2015 0849   NITRITE negative 11/08/2014 1134   LEUKOCYTESUR Negative 01/10/2015 0849   LEUKOCYTESUR Negative 11/08/2014 1134      Assessment & Plan:   1. BPH -continue avodart, flomax  2. Hypogonadism -continue axiron - CBC with Differential/Platelet - Testosterone - Hepatic function panel - PSA - Urinalysis, Complete -lipipds -fsh/lh/prolactin   Return in about 3 months (around 09/19/2015) for with testosterone, CBC one week prior.  Hildred Laser, MD  [facility] [address], [address] [place], [place] [address] [phone]

## 2015-06-21 ENCOUNTER — Other Ambulatory Visit: Payer: Self-pay | Admitting: Family Medicine

## 2015-06-21 LAB — HEPATIC FUNCTION PANEL
ALK PHOS: 65 IU/L (ref 39–117)
ALT: 19 IU/L (ref 0–44)
AST: 22 IU/L (ref 0–40)
Albumin: 4.4 g/dL (ref 3.5–5.5)
Bilirubin Total: 0.7 mg/dL (ref 0.0–1.2)
Bilirubin, Direct: 0.19 mg/dL (ref 0.00–0.40)
TOTAL PROTEIN: 6.5 g/dL (ref 6.0–8.5)

## 2015-06-21 LAB — CBC WITH DIFFERENTIAL/PLATELET
BASOS: 0 %
Basophils Absolute: 0 10*3/uL (ref 0.0–0.2)
EOS (ABSOLUTE): 0.1 10*3/uL (ref 0.0–0.4)
Eos: 1 %
HEMOGLOBIN: 14.7 g/dL (ref 12.6–17.7)
Hematocrit: 44.2 % (ref 37.5–51.0)
IMMATURE GRANS (ABS): 0 10*3/uL (ref 0.0–0.1)
Immature Granulocytes: 0 %
LYMPHS: 30 %
Lymphocytes Absolute: 1.8 10*3/uL (ref 0.7–3.1)
MCH: 32.1 pg (ref 26.6–33.0)
MCHC: 33.3 g/dL (ref 31.5–35.7)
MCV: 97 fL (ref 79–97)
MONOCYTES: 7 %
Monocytes Absolute: 0.4 10*3/uL (ref 0.1–0.9)
NEUTROS ABS: 3.7 10*3/uL (ref 1.4–7.0)
Neutrophils: 62 %
Platelets: 204 10*3/uL (ref 150–379)
RBC: 4.58 x10E6/uL (ref 4.14–5.80)
RDW: 13.5 % (ref 12.3–15.4)
WBC: 5.9 10*3/uL (ref 3.4–10.8)

## 2015-06-21 LAB — LIPID PANEL
CHOLESTEROL TOTAL: 186 mg/dL (ref 100–199)
Chol/HDL Ratio: 3.5 ratio units (ref 0.0–5.0)
HDL: 53 mg/dL (ref >39)
LDL CALC: 112 mg/dL — AB (ref 0–99)
Triglycerides: 103 mg/dL (ref 0–149)
VLDL Cholesterol Cal: 21 mg/dL (ref 5–40)

## 2015-06-21 LAB — FSH/LH
FSH: 7.8 m[IU]/mL (ref 1.5–12.4)
LH: 7.6 m[IU]/mL (ref 1.7–8.6)

## 2015-06-21 LAB — TESTOSTERONE: TESTOSTERONE: 654 ng/dL (ref 348–1197)

## 2015-06-21 LAB — PSA: PROSTATE SPECIFIC AG, SERUM: 0.9 ng/mL (ref 0.0–4.0)

## 2015-06-21 LAB — TSH: TSH: 2.24 u[IU]/mL (ref 0.450–4.500)

## 2015-06-24 ENCOUNTER — Other Ambulatory Visit: Payer: Self-pay | Admitting: Family Medicine

## 2015-07-20 ENCOUNTER — Other Ambulatory Visit: Payer: Self-pay | Admitting: Urology

## 2015-07-27 ENCOUNTER — Other Ambulatory Visit: Payer: Self-pay | Admitting: Family Medicine

## 2015-08-07 ENCOUNTER — Ambulatory Visit (INDEPENDENT_AMBULATORY_CARE_PROVIDER_SITE_OTHER): Payer: BLUE CROSS/BLUE SHIELD | Admitting: Family Medicine

## 2015-08-07 VITALS — BP 146/82 | HR 84 | Temp 98.3°F | Resp 16 | Wt 215.0 lb

## 2015-08-07 DIAGNOSIS — I10 Essential (primary) hypertension: Secondary | ICD-10-CM

## 2015-08-07 DIAGNOSIS — K219 Gastro-esophageal reflux disease without esophagitis: Secondary | ICD-10-CM

## 2015-08-07 DIAGNOSIS — E291 Testicular hypofunction: Secondary | ICD-10-CM

## 2015-08-07 DIAGNOSIS — J45909 Unspecified asthma, uncomplicated: Secondary | ICD-10-CM

## 2015-08-07 MED ORDER — HYDROCHLOROTHIAZIDE 25 MG PO TABS: 25.0000 mg | ORAL_TABLET | Freq: Every day | ORAL | Status: DC

## 2015-08-07 NOTE — Progress Notes (Signed)
Patient ID: [name] [name], male   DOB: Apr 11, 1955, 60 y.o.   MRN: [id number]    Subjective:  HPI  Patient is here for follow up. He had CPE in August. Labs were done in April 2017 including Testosterone.  Hypertension: patient checks his b/p and it fluctuates 138/75, 143/74, 128/69. BP Readings from Last 3 Encounters:  08/07/15 146/82  06/20/15 150/80  05/29/15 162/88    GERD-takes Zantac and Dexilant. Symptoms are stable on this regimen.  Patient follows Dr. Preston Callas for asthma and Dr. Welton Flakes for CPAP and asthma too. Sees Dr. [name] for follow up.  Prior to Admission medications   Medication Sig Start Date End Date Taking? Authorizing Provider  Azelastine-Fluticasone (DYMISTA) 137-50 MCG/ACT SUSP 2 (two) times daily. by Nasal route.   Yes Historical Provider, MD  budesonide-formoterol (SYMBICORT) 160-4.5 MCG/ACT inhaler Inhale 2 puffs into the lungs 2 (two) times daily.   Yes Historical Provider, MD  calcium citrate (CALCITRATE - DOSED IN MG ELEMENTAL CALCIUM) 950 MG tablet Take 200 mg of elemental calcium by mouth daily.   Yes Historical Provider, MD  Cholecalciferol (D 2000) 2000 UNITS TABS Take by mouth daily.  09/29/12  Yes Historical Provider, MD  CIALIS 5 MG tablet TAKE 1 TABLET BY MOUTH EVERY DAY AS NEEDED ERECTILE DYSFUNCTION 07/20/15  Yes Harle Battiest, PA-C  DEXILANT 60 MG capsule TAKE 1 CAPSULE EVERY DAY 04/30/15  Yes Maple Hudson., MD  dutasteride (AVODART) 0.5 MG capsule TAKE ONE CAPSULE BY MOUTH EVERY DAY 06/21/15  Yes Richard Hulen Shouts., MD  ketotifen (ALAWAY) 0.025 % ophthalmic solution Place 1 drop into both eyes daily. 1 drop 2 (two) times daily.   Yes Historical Provider, MD  Multiple Vitamin (MULTI-VITAMINS) TABS Take by mouth daily.    Yes Historical Provider, MD  naproxen (NAPROSYN) 500 MG tablet TAKE 1 TABLET BY MOUTH TWICE A DAY AS NEEDED FOR PAIN 11/09/14  Yes Richard Hulen Shouts., MD  Omega-3 Fatty Acids (FISH OIL) 1000 MG CAPS Take by mouth  daily.    Yes Historical Provider, MD  ranitidine (ZANTAC) 300 MG capsule Take 300 mg by mouth every evening.    Yes Historical Provider, MD  tamsulosin (FLOMAX) 0.4 MG CAPS capsule Take by mouth. 08/02/14  Yes Historical Provider, MD  Testosterone Randa Ngo) 30 MG/ACT SOLN One swipe per axilla daily 01/10/15  Yes Lorraine Lax, MD  valsartan (DIOVAN) 320 MG tablet TAKE 1 TABLET EVERY DAY 06/25/15  Yes Maple Hudson., MD  verapamil (CALAN-SR) 180 MG CR tablet TAKE 1 TABLET BY MOUTH TWO TIMES A DAY 01/19/15  Yes Maple Hudson., MD    Patient Active Problem List   Diagnosis Date Noted  . Thrombosed external hemorrhoid 05/10/2015  . Allergic rhinitis 11/07/2014  . Arthralgia of hand 11/07/2014  . Airway hyperreactivity 11/07/2014  . Benign fibroma of prostate 11/07/2014  . Impotence of organic origin 11/07/2014  . Acid reflux 11/07/2014  . BP (high blood pressure) 11/07/2014  . 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 Obstructive apnea 11/07/2014  . Plantar fasciitis 11/07/2014  . 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 Testicular hypofunction 10/26/2013  . Cellulitis 10/26/2013  . Essential (primary) hypertension 10/26/2013  . Other specified counseling 10/26/2013  . Encounter for screening for other viral diseases 09/29/2012  . ASTHMATIC BRONCHITIS, ACUTE 03/25/2007  . HYPERTENSION, SEVERE 03/15/2007  . RHINITIS 03/15/2007  . ASTHMA 03/15/2007  . ACID REFLUX DISEASE 03/15/2007  . COUGH, CHRONIC 03/15/2007  Past Medical History  Diagnosis Date  . Allergy   . Asthma   . Hypertension   . Prostate enlargement   . HYPERTENSION, SEVERE 03/15/2007    Qualifier: Diagnosis of  By: Vernie Murders    . BP (high blood pressure) 11/07/2014    Should stop combination of Tekturna (DRI)  and ARB     Social History   Social History  . Marital Status: Married    Spouse Name: N/A  . Number of Children: N/A  . Years of Education: N/A   Occupational History  . Not  on file.   Social History Main Topics  . Smoking status: Never Smoker   . Smokeless tobacco: Never Used     Comment: Exposed to second hand smoke as a child  . Alcohol Use: 0.0 oz/week    0 Standard drinks or equivalent per week     Comment: 3/week/wine  . Drug Use: No  . Sexual Activity: Not on file   Other Topics Concern  . Not on file   Social History Narrative    Allergies  Allergen Reactions  . Moxifloxacin Hives    Avelox  . Pseudoephedrine Other (See Comments)    Confusion, passed out, numbness  . Pseudoephedrine Hcl     Other reaction(s): Syncope  . Sulfa Antibiotics Hives  . Sulfonamide Derivatives Hives    Review of Systems  Constitutional: Negative.   Respiratory: Negative.   Cardiovascular: Negative.   Gastrointestinal: Negative.   Musculoskeletal: Positive for joint pain.  Neurological: Negative.     Immunization History  Administered Date(s) Administered  . Influenza,inj,Quad PF,36+ Mos 11/28/2014  . Pneumococcal Polysaccharide-23 09/04/1998  . Td 11/15/2003  . Tdap 10/11/2013   Objective:  BP 146/82 mmHg  Pulse 84  Temp(Src) 98.3 F (36.8 C)  Resp 16  Wt 215 lb (97.523 kg)  Physical Exam  Constitutional: He is oriented to person, place, and time and well-developed, well-nourished, and in no distress.  HENT:  Head: Normocephalic and atraumatic.  Eyes: Conjunctivae are normal. Pupils are equal, round, and reactive to light.  Neck: Normal range of motion.  Cardiovascular: Normal rate, regular rhythm, normal heart sounds and intact distal pulses.   No murmur heard. Pulmonary/Chest: Effort normal and breath sounds normal. No respiratory distress. He has no wheezes.  Musculoskeletal: He exhibits no edema or tenderness.  Neurological: He is alert and oriented to person, place, and time.  Psychiatric: Mood, memory, affect and judgment normal.    Lab Results  Component Value Date   WBC 5.9 06/20/2015   HCT 44.2 06/20/2015   PLT 204  06/20/2015   CHOL 186 06/20/2015   TRIG 103 06/20/2015   HDL 53 06/20/2015   LDLCALC 112* 06/20/2015   TSH 2.240 06/20/2015    CMP     Component Value Date/Time   PROT 6.5 06/20/2015 0829   ALBUMIN 4.4 06/20/2015 0829   AST 22 06/20/2015 0829   ALT 19 06/20/2015 0829   ALKPHOS 65 06/20/2015 0829   BILITOT 0.7 06/20/2015 0829    Assessment and Plan :  1. Essential hypertension Borderline. Will add HCTZ. Re check on the next visit and check Renal panel then too.  2. Gastroesophageal reflux disease, esophagitis presence not specified Stable on current regimen.  3. Asthma, unspecified asthma severity, uncomplicated Stable.  4. Testicular hypofunction Stable on last check in April 2017. I have done the exam and reviewed the above chart and it is accurate to the best of my knowledge.  Patient was seen and examined by Dr. Bosie Closichard L Gilbert and note was scribed by Samara DeistAnastasiya Aleksandrova, RMA.  Julieanne Mansonichard Gilbert MD [facility] 08/07/2015 3:49 PM

## 2015-08-08 ENCOUNTER — Other Ambulatory Visit: Payer: Self-pay | Admitting: Emergency Medicine

## 2015-08-08 ENCOUNTER — Telehealth: Payer: Self-pay | Admitting: Emergency Medicine

## 2015-08-08 DIAGNOSIS — I10 Essential (primary) hypertension: Secondary | ICD-10-CM

## 2015-08-08 MED ORDER — HYDRALAZINE HCL 25 MG PO TABS: 25.0000 mg | ORAL_TABLET | Freq: Two times a day (BID) | ORAL | Status: DC

## 2015-08-08 NOTE — Telephone Encounter (Signed)
He has the hives that I would avoid it. It is a rare drug interaction. No HCTZ. We'll try hydralazine 25 mg twice a day,#60,12rf.

## 2015-08-08 NOTE — Telephone Encounter (Signed)
Pt informed. Med sent to pharmacy.  

## 2015-08-08 NOTE — Telephone Encounter (Signed)
Pt was reading up on HCTZ and read that if you are allergic to Sulfa drugs, not to take HCTZ. Pt wants to know if he should take it or not. Sulfa drugs give him hives. Please advise. Thanks

## 2015-08-10 ENCOUNTER — Other Ambulatory Visit: Payer: Self-pay | Admitting: Family Medicine

## 2015-09-05 ENCOUNTER — Other Ambulatory Visit: Payer: BLUE CROSS/BLUE SHIELD

## 2015-09-05 ENCOUNTER — Encounter: Payer: BLUE CROSS/BLUE SHIELD | Admitting: Family Medicine

## 2015-09-05 DIAGNOSIS — E291 Testicular hypofunction: Secondary | ICD-10-CM

## 2015-09-06 ENCOUNTER — Ambulatory Visit (INDEPENDENT_AMBULATORY_CARE_PROVIDER_SITE_OTHER): Payer: BLUE CROSS/BLUE SHIELD | Admitting: Family Medicine

## 2015-09-06 ENCOUNTER — Other Ambulatory Visit: Payer: Self-pay | Admitting: Family Medicine

## 2015-09-06 ENCOUNTER — Ambulatory Visit: Payer: BLUE CROSS/BLUE SHIELD | Admitting: Family Medicine

## 2015-09-06 VITALS — BP 152/84 | HR 72 | Resp 14 | Wt 216.0 lb

## 2015-09-06 DIAGNOSIS — K219 Gastro-esophageal reflux disease without esophagitis: Secondary | ICD-10-CM | POA: Diagnosis not present

## 2015-09-06 DIAGNOSIS — I10 Essential (primary) hypertension: Secondary | ICD-10-CM

## 2015-09-06 MED ORDER — VERAPAMIL HCL ER 240 MG PO TBCR: 240.0000 mg | EXTENDED_RELEASE_TABLET | Freq: Two times a day (BID) | ORAL | Status: DC

## 2015-09-06 MED ORDER — SCOPOLAMINE 1 MG/3DAYS TD PT72: 1.0000 | MEDICATED_PATCH | TRANSDERMAL | Status: DC

## 2015-09-06 NOTE — Progress Notes (Signed)
Subjective:  HPI  Patient is here for 1 month follow up. We started patient on HCTZ but then seen he is on sulfa switched to Hydralazine just in case. He took Hydralazine for 2 days and developed hives, dyspnea, chest tightness and headache so he stopped and filled HCTZ and has tolerated it well so far. He has been checking his b/p and readings have been as follows 142/73, 153/86, 131/66, 110/55 and 137/74. He does notice that b/p with HCTZ not improving as fast as it did on Hydralazine. He does swim 30 minutes almost daily. He does watch his salt intake.  BP Readings from Last 3 Encounters:  09/06/15 152/84  08/07/15 146/82  06/20/15 150/80     Prior to Admission medications   Medication Sig Start Date End Date Taking? Authorizing Provider  Azelastine-Fluticasone (DYMISTA) 137-50 MCG/ACT SUSP 2 (two) times daily. by Nasal route.   Yes Historical Provider, MD  budesonide-formoterol (SYMBICORT) 160-4.5 MCG/ACT inhaler Inhale 2 puffs into the lungs 2 (two) times daily.   Yes Historical Provider, MD  calcium citrate (CALCITRATE - DOSED IN MG ELEMENTAL CALCIUM) 950 MG tablet Take 200 mg of elemental calcium by mouth daily.   Yes Historical Provider, MD  Cholecalciferol (D 2000) 2000 UNITS TABS Take by mouth daily.  09/29/12  Yes Historical Provider, MD  CIALIS 5 MG tablet TAKE 1 TABLET BY MOUTH EVERY DAY AS NEEDED ERECTILE DYSFUNCTION 07/20/15  Yes Harle BattiestShannon A McGowan, PA-C  DEXILANT 60 MG capsule TAKE 1 CAPSULE EVERY DAY 04/30/15  Yes Maple Hudsonichard L Shaniquia Brafford Jr., MD  dutasteride (AVODART) 0.5 MG capsule TAKE ONE CAPSULE BY MOUTH EVERY DAY 06/21/15  Yes Cordae Mccarey Hulen ShoutsL Bernetha Anschutz Jr., MD  hydrochlorothiazide (HYDRODIURIL) 25 MG tablet Take 1 tablet (25 mg total) by mouth daily. 08/07/15  Yes Dorrene Bently Hulen ShoutsL Sian Rockers Jr., MD  ketotifen (ALAWAY) 0.025 % ophthalmic solution Place 1 drop into both eyes daily. 1 drop 2 (two) times daily.   Yes Historical Provider, MD  Multiple Vitamin (MULTI-VITAMINS) TABS Take by mouth  daily.    Yes Historical Provider, MD  naproxen (NAPROSYN) 500 MG tablet TAKE 1 TABLET BY MOUTH TWICE A DAY AS NEEDED FOR PAIN 09/06/15  Yes Charlann Wayne Hulen ShoutsL Kaesyn Johnston Jr., MD  Omega-3 Fatty Acids (FISH OIL) 1000 MG CAPS Take by mouth daily.    Yes Historical Provider, MD  ranitidine (ZANTAC) 300 MG capsule Take 300 mg by mouth every evening.    Yes Historical Provider, MD  tamsulosin (FLOMAX) 0.4 MG CAPS capsule TAKE 1 CAPSULE BY MOUTH DAILY 08/10/15  Yes Maple Hudsonichard L Shelonda Saxe Jr., MD  Testosterone Randa Ngo(AXIRON) 30 MG/ACT SOLN One swipe per axilla daily 01/10/15  Yes Lorraine Laxichard D Hart, MD  valsartan (DIOVAN) 320 MG tablet TAKE 1 TABLET EVERY DAY 06/25/15  Yes Maple Hudsonichard L Gavin Telford Jr., MD  verapamil (CALAN-SR) 180 MG CR tablet TAKE 1 TABLET BY MOUTH TWO TIMES A DAY 01/19/15  Yes Maple Hudsonichard L Arienne Gartin Jr., MD    Patient Active Problem List   Diagnosis Date Noted  . Thrombosed external hemorrhoid 05/10/2015  . Allergic rhinitis 11/07/2014  . Arthralgia of hand 11/07/2014  . Airway hyperreactivity 11/07/2014  . Benign fibroma of prostate 11/07/2014  . Impotence of organic origin 11/07/2014  . Acid reflux 11/07/2014  . BP (high blood pressure) 11/07/2014  . 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 Obstructive apnea 11/07/2014  . Plantar fasciitis 11/07/2014  . 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 Testicular hypofunction 10/26/2013  . Cellulitis 10/26/2013  . Essential (primary)  hypertension 10/26/2013  . Other specified counseling 10/26/2013  . Encounter for screening for other viral diseases 09/29/2012  . ASTHMATIC BRONCHITIS, ACUTE 03/25/2007  . HYPERTENSION, SEVERE 03/15/2007  . RHINITIS 03/15/2007  . Asthma 03/15/2007  . ACID REFLUX DISEASE 03/15/2007  . COUGH, CHRONIC 03/15/2007    Past Medical History  Diagnosis Date  . Allergy   . Asthma   . Hypertension   . Prostate enlargement   . HYPERTENSION, SEVERE 03/15/2007    Qualifier: Diagnosis of  By: Vernie Murdersaskin, Leslie    . BP (high blood pressure)  11/07/2014    Should stop combination of Tekturna (DRI)  and ARB     Social History   Social History  . Marital Status: Married    Spouse Name: N/A  . Number of Children: N/A  . Years of Education: N/A   Occupational History  . Not on file.   Social History Main Topics  . Smoking status: Never Smoker   . Smokeless tobacco: Never Used     Comment: Exposed to second hand smoke as a child  . Alcohol Use: 0.0 oz/week    0 Standard drinks or equivalent per week     Comment: 3/week/wine  . Drug Use: No  . Sexual Activity: Not on file   Other Topics Concern  . Not on file   Social History Narrative    Allergies  Allergen Reactions  . Hydralazine Hives    Chest tightness, severe headache, dyspnea  . Moxifloxacin Hives    Avelox  . Pseudoephedrine Other (See Comments)    Confusion, passed out, numbness  . Pseudoephedrine Hcl     Other reaction(s): Syncope  . Sulfa Antibiotics Hives  . Sulfonamide Derivatives Hives    Review of Systems  Constitutional: Negative.   Eyes: Negative.   Respiratory: Negative.   Cardiovascular: Negative.   Gastrointestinal: Negative.   Musculoskeletal: Negative.   Skin: Negative.   Neurological: Negative.   Endo/Heme/Allergies: Negative.   Psychiatric/Behavioral: Negative.     Immunization History  Administered Date(s) Administered  . Influenza,inj,Quad PF,36+ Mos 11/28/2014  . Pneumococcal Polysaccharide-23 09/04/1998  . Td 11/15/2003  . Tdap 10/11/2013   Objective:  BP 152/84 mmHg  Pulse 72  Resp 14  Wt 216 lb (97.977 kg)  Physical Exam  Constitutional: He is oriented to person, place, and time and well-developed, well-nourished, and in no distress.  HENT:  Head: Normocephalic and atraumatic.  Right Ear: External ear normal.  Left Ear: External ear normal.  Eyes: Conjunctivae are normal. Pupils are equal, round, and reactive to light.  Neck: Normal range of motion. Neck supple.  Cardiovascular: Normal rate, regular  rhythm, normal heart sounds and intact distal pulses.   No murmur heard. Pulmonary/Chest: Effort normal and breath sounds normal. No respiratory distress.  Abdominal: Soft.  Musculoskeletal: He exhibits no edema or tenderness.  Neurological: He is alert and oriented to person, place, and time.  Skin: Skin is warm and dry.  Psychiatric: Mood, memory, affect and judgment normal.    Lab Results  Component Value Date   WBC 5.2 09/05/2015   HCT 41.6 09/05/2015   PLT 203 09/05/2015   CHOL 186 06/20/2015   TRIG 103 06/20/2015   HDL 53 06/20/2015   LDLCALC 112* 06/20/2015   TSH 2.240 06/20/2015    CMP     Component Value Date/Time   PROT 6.5 06/20/2015 0829   ALBUMIN 4.4 06/20/2015 0829   AST 22 06/20/2015 0829   ALT 19 06/20/2015 0829  ALKPHOS 65 06/20/2015 0829   BILITOT 0.7 06/20/2015 0829    Assessment and Plan :  1. Essential hypertension Still elevated. He is exercising and watching his food intake per patient. Will increase Verapamil to 240 mg BID. Patient gets better readings at home per patient. Re check in 2 months. Next step will Clonidine or beta blocker but rather not try beta blocker with patient having asthma.  2. Gastroesophageal reflux disease, esophagitis presence not specified Stable. 3. Allergic rhinitis / asthma Patient was seen and examined by Dr. Bosie Clos and note was scribed by Samara Deist, RMA.   I have done the exam and reviewed the above chart and it is accurate to the best of my knowledge.  Julieanne Manson MD [facility] 09/06/2015 4:33 PM

## 2015-09-07 LAB — CBC WITH DIFFERENTIAL/PLATELET
BASOS: 0 %
Basophils Absolute: 0 10*3/uL (ref 0.0–0.2)
EOS (ABSOLUTE): 0.1 10*3/uL (ref 0.0–0.4)
EOS: 2 %
HEMATOCRIT: 41.6 % (ref 37.5–51.0)
HEMOGLOBIN: 14.3 g/dL (ref 12.6–17.7)
IMMATURE GRANS (ABS): 0 10*3/uL (ref 0.0–0.1)
IMMATURE GRANULOCYTES: 0 %
LYMPHS: 28 %
Lymphocytes Absolute: 1.5 10*3/uL (ref 0.7–3.1)
MCH: 32 pg (ref 26.6–33.0)
MCHC: 34.4 g/dL (ref 31.5–35.7)
MCV: 93 fL (ref 79–97)
MONOCYTES: 7 %
Monocytes Absolute: 0.4 10*3/uL (ref 0.1–0.9)
NEUTROS PCT: 63 %
Neutrophils Absolute: 3.3 10*3/uL (ref 1.4–7.0)
Platelets: 203 10*3/uL (ref 150–379)
RBC: 4.47 x10E6/uL (ref 4.14–5.80)
RDW: 13.5 % (ref 12.3–15.4)
WBC: 5.2 10*3/uL (ref 3.4–10.8)

## 2015-09-07 LAB — TESTOSTERONE,FREE AND TOTAL
TESTOSTERONE: 464 ng/dL (ref 348–1197)
Testosterone, Free: 7.2 pg/mL (ref 7.2–24.0)

## 2015-09-12 ENCOUNTER — Other Ambulatory Visit: Payer: BLUE CROSS/BLUE SHIELD

## 2015-09-14 ENCOUNTER — Ambulatory Visit: Payer: Self-pay

## 2015-09-19 ENCOUNTER — Ambulatory Visit (INDEPENDENT_AMBULATORY_CARE_PROVIDER_SITE_OTHER): Payer: BLUE CROSS/BLUE SHIELD | Admitting: Urology

## 2015-09-19 VITALS — BP 131/80 | HR 76 | Ht 72.0 in | Wt 212.6 lb

## 2015-09-19 DIAGNOSIS — N400 Enlarged prostate without lower urinary tract symptoms: Secondary | ICD-10-CM | POA: Diagnosis not present

## 2015-09-19 DIAGNOSIS — E291 Testicular hypofunction: Secondary | ICD-10-CM | POA: Diagnosis not present

## 2015-09-19 MED ORDER — TESTOSTERONE 30 MG/ACT TD SOLN: TRANSDERMAL | Status: DC

## 2015-09-19 NOTE — Progress Notes (Signed)
09/19/2015 8:44 AM   [name] [name] 08/05/1955 [id number]  Referring provider: Maple Hudson., MD [address] [place], [place] [address]  Chief Complaint  Patient presents with  . Follow-up    hypogonadism,     HPI: The patient is a 60 year old gentleman with a past medical history of BPH on Avodart and tamsulosin and hypogonadism on axiron presents for follow-up.The patient is doing well since his last seen. He has no complaints at this time. He is very pleased with his urinary status.  He is also Axiron for hypogonadism. He feels like he has a good sex drive with no fatigue.  T: 464 Hct: 46.1  PMH: Past Medical History  Diagnosis Date  . Allergy   . Asthma   . Hypertension   . Prostate enlargement   . HYPERTENSION, SEVERE 03/15/2007    Qualifier: Diagnosis of  By: Vernie Murders    . BP (high blood pressure) 11/07/2014    Should stop combination of Tekturna (DRI)  and ARB     Surgical History: Past Surgical History  Procedure Laterality Date  . Knee arthroscopy Left   . Vasectomy    . Hemorrhoid surgery    . Tonsillectomy    . Colonoscopy  2008    Dr Lemar Livings  . Appendectomy  1990    Dr Lemar Livings  . Hernia repair  01/20/14    umbilical hernia  . Knee surgery Left 1986    Home Medications:    Medication List       This list is accurate as of: 09/19/15  8:44 AM.  Always use your most recent med list.               ALAWAY 0.025 % ophthalmic solution  Generic drug:  ketotifen  Place 1 drop into both eyes daily. 1 drop 2 (two) times daily.     calcium citrate 950 MG tablet  Commonly known as:  CALCITRATE - dosed in mg elemental calcium  Take 200 mg of elemental calcium by mouth daily.     CIALIS 5 MG tablet  Generic drug:  tadalafil  TAKE 1 TABLET BY MOUTH EVERY DAY AS NEEDED ERECTILE DYSFUNCTION     D 2000 2000 units Tabs  Generic drug:  Cholecalciferol  Take by mouth daily.     DEXILANT 60 MG capsule  Generic drug:   dexlansoprazole  TAKE 1 CAPSULE EVERY DAY     dutasteride 0.5 MG capsule  Commonly known as:  AVODART  TAKE ONE CAPSULE BY MOUTH EVERY DAY     DYMISTA 137-50 MCG/ACT Susp  Generic drug:  Azelastine-Fluticasone  2 (two) times daily. by Nasal route.     Fish Oil 1000 MG Caps  Take by mouth daily.     hydrochlorothiazide 25 MG tablet  Commonly known as:  HYDRODIURIL  Take 1 tablet (25 mg total) by mouth daily.     MULTI-VITAMINS Tabs  Take by mouth daily.     naproxen 500 MG tablet  Commonly known as:  NAPROSYN  TAKE 1 TABLET BY MOUTH TWICE A DAY AS NEEDED FOR PAIN     ranitidine 300 MG capsule  Commonly known as:  ZANTAC  Take 300 mg by mouth every evening.     scopolamine 1 MG/3DAYS  Commonly known as:  TRANSDERM-SCOP (1.5 MG)  Place 1 patch (1.5 mg total) onto the skin every 3 (three) days.     SYMBICORT 160-4.5 MCG/ACT inhaler  Generic drug:  budesonide-formoterol  Inhale 2 puffs into the lungs 2 (two) times daily.     tamsulosin 0.4 MG Caps capsule  Commonly known as:  FLOMAX  TAKE 1 CAPSULE BY MOUTH DAILY     Testosterone 30 MG/ACT Soln  Commonly known as:  AXIRON  One swipe per axilla daily     valsartan 320 MG tablet  Commonly known as:  DIOVAN  TAKE 1 TABLET EVERY DAY     verapamil 240 MG CR tablet  Commonly known as:  CALAN-SR  Take 1 tablet (240 mg total) by mouth 2 (two) times daily.        Allergies:  Allergies  Allergen Reactions  . Hydralazine Hives    Chest tightness, severe headache, dyspnea  . Moxifloxacin Hives    Avelox  . Pseudoephedrine Other (See Comments)    Confusion, passed out, numbness  . Pseudoephedrine Hcl     Other reaction(s): Syncope  . Sulfa Antibiotics Hives  . Sulfonamide Derivatives Hives    Family History: Family History  Problem Relation Age of Onset  . Cancer Mother     ovarian  . Glaucoma Mother   . Cancer Father     prostate  . Cancer - Prostate Paternal Uncle   . Hypertension Sister   . Cancer  Maternal Uncle     lung; two mat uncles  . Cancer Paternal Grandmother     breast  . Asthma Sister   . Alcohol abuse Paternal Uncle   . Liver disease Paternal Uncle   . Cancer Paternal Aunt     lung cancer in a smoker    Social History:  reports that he has never smoked. He has never used smokeless tobacco. He reports that he drinks alcohol. He reports that he does not use illicit drugs.  ROS: UROLOGY Frequent Urination?: No Hard to postpone urination?: No Burning/pain with urination?: No Get up at night to urinate?: No Leakage of urine?: No Urine stream starts and stops?: No Trouble starting stream?: No Do you have to strain to urinate?: No Blood in urine?: No Urinary tract infection?: No Sexually transmitted disease?: No Injury to kidneys or bladder?: No Painful intercourse?: No Weak stream?: No Erection problems?: No Penile pain?: No  Gastrointestinal Nausea?: No Vomiting?: No Indigestion/heartburn?: No Diarrhea?: No Constipation?: No  Constitutional Fever: No Night sweats?: No Weight loss?: No Fatigue?: No  Skin Skin rash/lesions?: No Itching?: No  Eyes Blurred vision?: No Double vision?: No  Ears/Nose/Throat Sore throat?: No Sinus problems?: No  Hematologic/Lymphatic Swollen glands?: No Easy bruising?: No  Cardiovascular Leg swelling?: No Chest pain?: No  Respiratory Cough?: No Shortness of breath?: No  Endocrine Excessive thirst?: No  Musculoskeletal Back pain?: No Joint pain?: No  Neurological Headaches?: No Dizziness?: No  Psychologic Depression?: No Anxiety?: No  Physical Exam: BP 131/80 mmHg  Pulse 76  Ht 6' (1.829 m)  Wt 212 lb 9.6 oz (96.435 kg)  BMI 28.83 kg/m2  Constitutional:  Alert and oriented, No acute distress. HEENT: [place] AT, moist mucus membranes.  Trachea midline, no masses. Cardiovascular: No clubbing, cyanosis, or edema. Respiratory: Normal respiratory effort, no increased work of breathing. GI:  Abdomen is soft, nontender, nondistended, no abdominal masses GU: No CVA tenderness.  Skin: No rashes, bruises or suspicious lesions. Lymph: No cervical or inguinal adenopathy. Neurologic: Grossly intact, no focal deficits, moving all 4 extremities. Psychiatric: Normal mood and affect.  Laboratory Data: Lab Results  Component Value Date   WBC 5.2 09/05/2015   HCT 41.6 09/05/2015  MCV 93 09/05/2015   PLT 203 09/05/2015    No results found for: CREATININE  No results found for: PSA  Lab Results  Component Value Date   TESTOSTERONE 464 09/05/2015    No results found for: HGBA1C  Urinalysis    Component Value Date/Time   APPEARANCEUR Clear 06/20/2015 0833   GLUCOSEU Negative 06/20/2015 0833   BILIRUBINUR Negative 06/20/2015 0833   BILIRUBINUR negative 11/08/2014 1134   PROTEINUR Negative 06/20/2015 0833   PROTEINUR negative 11/08/2014 1134   UROBILINOGEN negative 11/08/2014 1134   NITRITE Negative 06/20/2015 0833   NITRITE negative 11/08/2014 1134   LEUKOCYTESUR Negative 06/20/2015 0833   LEUKOCYTESUR Negative 11/08/2014 1134     Assessment & Plan:    1. BPH -continue avodart, flomax  2. Hypogonadism -continue axiron - follow up in 3 months w/ CBC, Testosterone, PSA prior  Return in about 3 months (around 12/20/2015) for labs prior.  Hildred LaserBrian James Towanna Avery, MD  [facility] [address], [address] [place], [place] [address] [phone]

## 2015-10-30 ENCOUNTER — Ambulatory Visit
Admission: RE | Admit: 2015-10-30 | Discharge: 2015-10-30 | Disposition: A | Discharge status: Home / Self Care | Payer: BLUE CROSS/BLUE SHIELD | Source: Ambulatory Visit | Attending: Internal Medicine | Admitting: Internal Medicine

## 2015-10-30 ENCOUNTER — Other Ambulatory Visit: Payer: Self-pay | Admitting: Internal Medicine

## 2015-10-30 DIAGNOSIS — R05 Cough: Secondary | ICD-10-CM | POA: Insufficient documentation

## 2015-10-30 DIAGNOSIS — J45909 Unspecified asthma, uncomplicated: Secondary | ICD-10-CM | POA: Insufficient documentation

## 2015-10-30 DIAGNOSIS — R059 Cough, unspecified: Secondary | ICD-10-CM

## 2015-11-12 ENCOUNTER — Other Ambulatory Visit: Payer: Self-pay | Admitting: Urology

## 2015-12-05 ENCOUNTER — Encounter: Payer: BLUE CROSS/BLUE SHIELD | Admitting: Family Medicine

## 2015-12-10 DIAGNOSIS — M1712 Unilateral primary osteoarthritis, left knee: Secondary | ICD-10-CM | POA: Insufficient documentation

## 2015-12-18 ENCOUNTER — Encounter: Payer: BLUE CROSS/BLUE SHIELD | Admitting: Family Medicine

## 2015-12-19 ENCOUNTER — Encounter: Payer: Self-pay | Admitting: Family Medicine

## 2015-12-19 ENCOUNTER — Ambulatory Visit (INDEPENDENT_AMBULATORY_CARE_PROVIDER_SITE_OTHER): Payer: BLUE CROSS/BLUE SHIELD | Admitting: Family Medicine

## 2015-12-19 VITALS — BP 144/80 | HR 68 | Temp 97.9°F | Resp 14 | Ht 72.0 in | Wt 214.0 lb

## 2015-12-19 DIAGNOSIS — Z1211 Encounter for screening for malignant neoplasm of colon: Secondary | ICD-10-CM

## 2015-12-19 DIAGNOSIS — Z0000 Encounter for general adult medical examination without abnormal findings: Secondary | ICD-10-CM | POA: Diagnosis not present

## 2015-12-19 LAB — POCT URINALYSIS DIPSTICK
Bilirubin, UA: NEGATIVE
GLUCOSE UA: NEGATIVE
Ketones, UA: NEGATIVE
Leukocytes, UA: NEGATIVE
NITRITE UA: NEGATIVE
PH UA: 7.5
PROTEIN UA: NEGATIVE
RBC UA: NEGATIVE
SPEC GRAV UA: 1.015
UROBILINOGEN UA: 0.2

## 2015-12-19 NOTE — Progress Notes (Signed)
Patient: [name] [name], Male    DOB: [birth year], 60 y.o.   MRN: [id number] Visit Date: 12/19/2015  Today's Provider: Megan Mans, MD   Chief Complaint  Patient presents with  . Annual Exam   Subjective:    Annual physical exam [name] [name] is a 60 y.o. male who presents today for health maintenance and complete physical. He feels well. He reports exercising 4 times a week, swimming. He reports he is sleeping well.  ----------------------------------------------------------------- Colonoscopy- 07/13/06 2 polyps- hyperplastic mucosal prominence at the appendiceal orifice   Immunization History  Administered Date(s) Administered  . Influenza,inj,Quad PF,36+ Mos 11/28/2014  . Pneumococcal Polysaccharide-23 09/04/1998  . Td 11/15/2003  . Tdap 10/11/2013     Review of Systems  Constitutional: Negative.   HENT: Positive for congestion and postnasal drip.   Eyes: Negative.   Respiratory: Positive for apnea and cough.   Cardiovascular: Negative.   Gastrointestinal: Negative.   Endocrine: Negative.   Genitourinary: Negative.   Musculoskeletal: Positive for arthralgias.  Skin: Negative.   Allergic/Immunologic: Positive for environmental allergies.  Neurological: Negative.   Hematological: Negative.   Psychiatric/Behavioral: Negative.     Social History      He  reports that he has never smoked. He has never used smokeless tobacco. He reports that he drinks alcohol. He reports that he does not use drugs.       Social History   Social History  . Marital status: Married    Spouse name: N/A  . Number of children: N/A  . Years of education: N/A   Social History Main Topics  . Smoking status: Never Smoker  . Smokeless tobacco: Never Used     Comment: Exposed to second hand smoke as a child  . Alcohol use 0.0 oz/week     Comment: occasionally  . Drug use: No  . Sexual activity: Not Asked   Other Topics Concern  . None   Social  History Narrative  . None    Past Medical History:  Diagnosis Date  . Allergy   . Asthma   . BP (high blood pressure) 11/07/2014   Should stop combination of Tekturna (DRI)  and ARB   . Hypertension   . HYPERTENSION, SEVERE 03/15/2007   Qualifier: Diagnosis of  By: Vernie Murders    . Prostate enlargement      Patient Active Problem List   Diagnosis Date Noted  . Thrombosed external hemorrhoid 05/10/2015  . Allergic rhinitis 11/07/2014  . Arthralgia of hand 11/07/2014  . Airway hyperreactivity 11/07/2014  . Benign fibroma of prostate 11/07/2014  . Impotence of organic origin 11/07/2014  . Acid reflux 11/07/2014  . BP (high blood pressure) 11/07/2014  . 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 Obstructive apnea 11/07/2014  . Plantar fasciitis 11/07/2014  . 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 Testicular hypofunction 10/26/2013  . Cellulitis 10/26/2013  . Essential (primary) hypertension 10/26/2013  . Other specified counseling 10/26/2013  . Febrile 10/26/2013  . Encounter for screening for other viral diseases 09/29/2012  . ASTHMATIC BRONCHITIS, ACUTE 03/25/2007  . HYPERTENSION, SEVERE 03/15/2007  . RHINITIS 03/15/2007  . Asthma 03/15/2007  . ACID REFLUX DISEASE 03/15/2007  . COUGH, CHRONIC 03/15/2007    Past Surgical History:  Procedure Laterality Date  . APPENDECTOMY  1990   Dr Lemar Livings  . COLONOSCOPY  2008   Dr Lemar Livings  . HEMORRHOID SURGERY    . HERNIA REPAIR  01/20/14   umbilical hernia  .  KNEE ARTHROSCOPY Left   . KNEE SURGERY Left 1986  . TONSILLECTOMY    . VASECTOMY      Family History        Family Status  Relation Status  . Mother Deceased at age 60  . Father Deceased at age 60  . Sister Alive  . Sister Alive  . Paternal Uncle Deceased  . Sister Alive  . Sister Alive  . Paternal Uncle   . Maternal Uncle   . Paternal Grandmother   . Paternal Aunt         His family history includes Alcohol abuse in his paternal uncle; Asthma  in his sister; Cancer in his father, maternal uncle, mother, paternal aunt, and paternal grandmother; Cancer - Prostate in his paternal uncle; Glaucoma in his mother; Hypertension in his sister; Liver disease in his paternal uncle.    Allergies  Allergen Reactions  . Hydralazine Hives    Chest tightness, severe headache, dyspnea  . Moxifloxacin Hives    Avelox  . Pseudoephedrine Other (See Comments)    Confusion, passed out, numbness  . Pseudoephedrine Hcl     Other reaction(s): Syncope  . Sulfa Antibiotics Hives  . Sulfonamide Derivatives Hives    Current Meds  Medication Sig  . Azelastine-Fluticasone (DYMISTA) 137-50 MCG/ACT SUSP 2 (two) times daily. by Nasal route.  . budesonide-formoterol (SYMBICORT) 160-4.5 MCG/ACT inhaler Inhale 2 puffs into the lungs 2 (two) times daily.  . calcium citrate (CALCITRATE - DOSED IN MG ELEMENTAL CALCIUM) 950 MG tablet Take 200 mg of elemental calcium by mouth daily.  . Cholecalciferol (D 2000) 2000 UNITS TABS Take by mouth daily.   Marland Kitchen. CIALIS 5 MG tablet TAKE 1 TABLET BY MOUTH EVERY DAY AS NEEDED ERECTILE DYSFUNCTION  . DEXILANT 60 MG capsule TAKE 1 CAPSULE EVERY DAY  . dutasteride (AVODART) 0.5 MG capsule TAKE ONE CAPSULE BY MOUTH EVERY DAY  . hydrochlorothiazide (HYDRODIURIL) 25 MG tablet Take 1 tablet (25 mg total) by mouth daily.  Marland Kitchen. ketotifen (ALAWAY) 0.025 % ophthalmic solution Place 1 drop into both eyes daily. 1 drop 2 (two) times daily.  . Multiple Vitamin (MULTI-VITAMINS) TABS Take by mouth daily.   . naproxen (NAPROSYN) 500 MG tablet TAKE 1 TABLET BY MOUTH TWICE A DAY AS NEEDED FOR PAIN  . ranitidine (ZANTAC) 300 MG capsule Take 300 mg by mouth every evening.   . tamsulosin (FLOMAX) 0.4 MG CAPS capsule TAKE 1 CAPSULE BY MOUTH DAILY  . Testosterone (AXIRON) 30 MG/ACT SOLN One swipe per axilla daily  . Tiotropium Bromide Monohydrate (SPIRIVA RESPIMAT) 1.25 MCG/ACT AERS Inhale into the lungs.  . valsartan (DIOVAN) 320 MG tablet TAKE 1 TABLET  EVERY DAY  . verapamil (CALAN-SR) 240 MG CR tablet Take 1 tablet (240 mg total) by mouth 2 (two) times daily.    Patient Care Team: Maple Hudsonichard L Gilbert Jr., MD as PCP - General (Family Medicine) Maple Hudsonichard L Gilbert Jr., MD (Family Medicine) Earline MayotteJeffrey W Byrnett, MD (General Surgery)     Objective:   Vitals: BP (!) 144/80 (BP Location: Left Arm, Patient Position: Sitting, Cuff Size: Normal)   Pulse 68   Temp 97.9 F (36.6 C) (Oral)   Resp 14   Ht 6' (1.829 m)   Wt 214 lb (97.1 kg)   BMI 29.02 kg/m    Physical Exam  Constitutional: He is oriented to person, place, and time. He appears well-developed and well-nourished.  HENT:  Head: Normocephalic and atraumatic.  Right Ear: External ear normal.  Left  Ear: External ear normal.  Nose: Nose normal.  Mouth/Throat: Oropharynx is clear and moist.  Eyes: Conjunctivae and EOM are normal. Pupils are equal, round, and reactive to light.  Neck: Normal range of motion. Neck supple.  Cardiovascular: Normal rate, regular rhythm, normal heart sounds and intact distal pulses.   Pulmonary/Chest: Effort normal and breath sounds normal.  Abdominal: Soft. Bowel sounds are normal.  Genitourinary: Rectum normal, prostate normal and penis normal.  Musculoskeletal: Normal range of motion. He exhibits edema.  1+ lower extremity edema  Neurological: He is alert and oriented to person, place, and time. He has normal reflexes.  Skin: Skin is warm and dry.  Psychiatric: He has a normal mood and affect. His behavior is normal. Judgment and thought content normal.     Depression Screen PHQ 2/9 Scores 11/07/2014  PHQ - 2 Score 0      Assessment & Plan:     Routine Health Maintenance and Physical Exam  Exercise Activities and Dietary recommendations Goals    None      Immunization History  Administered Date(s) Administered  . Influenza,inj,Quad PF,36+ Mos 11/28/2014  . Pneumococcal Polysaccharide-23 09/04/1998  . Td 11/15/2003  . Tdap  10/11/2013    Health Maintenance  Topic Date Due  . HIV Screening  11/12/1970  . ZOSTAVAX  11/12/2015  . INFLUENZA VACCINE  12/17/2016 (Originally 10/09/2015)  . COLONOSCOPY  07/12/2016  . TETANUS/TDAP  10/12/2023  . Hepatitis C Screening  Completed     Refer back to Dr. Lemar Livings for all of the screening colonoscopy from 9 years ago. Discussed health benefits of physical activity, and encouraged him to engage in regular exercise appropriate for his age and condition.   Hypertension GERD Mild asthma/allergic rhinitis Mild hypogonadism--continue to work on diet to lose little bit of weight. BPH  --------------------------------------------------------------------   I have done the exam and reviewed the above chart and it is accurate to the best of my knowledge.  Richard Wendelyn Breslow, MD  [facility]

## 2015-12-20 LAB — CBC WITH DIFFERENTIAL/PLATELET
BASOS ABS: 0 10*3/uL (ref 0.0–0.2)
Basos: 0 %
EOS (ABSOLUTE): 0.1 10*3/uL (ref 0.0–0.4)
Eos: 1 %
HEMOGLOBIN: 14.1 g/dL (ref 12.6–17.7)
Hematocrit: 40.2 % (ref 37.5–51.0)
IMMATURE GRANS (ABS): 0 10*3/uL (ref 0.0–0.1)
Immature Granulocytes: 0 %
LYMPHS: 26 %
Lymphocytes Absolute: 1.3 10*3/uL (ref 0.7–3.1)
MCH: 32.1 pg (ref 26.6–33.0)
MCHC: 35.1 g/dL (ref 31.5–35.7)
MCV: 92 fL (ref 79–97)
MONOCYTES: 7 %
Monocytes Absolute: 0.4 10*3/uL (ref 0.1–0.9)
NEUTROS PCT: 66 %
Neutrophils Absolute: 3.3 10*3/uL (ref 1.4–7.0)
PLATELETS: 194 10*3/uL (ref 150–379)
RBC: 4.39 x10E6/uL (ref 4.14–5.80)
RDW: 13 % (ref 12.3–15.4)
WBC: 5.1 10*3/uL (ref 3.4–10.8)

## 2015-12-20 LAB — COMPREHENSIVE METABOLIC PANEL
A/G RATIO: 2.3 — AB (ref 1.2–2.2)
ALT: 22 IU/L (ref 0–44)
AST: 19 IU/L (ref 0–40)
Albumin: 4.4 g/dL (ref 3.6–4.8)
Alkaline Phosphatase: 58 IU/L (ref 39–117)
BUN/Creatinine Ratio: 13 (ref 10–24)
BUN: 14 mg/dL (ref 8–27)
Bilirubin Total: 0.5 mg/dL (ref 0.0–1.2)
CALCIUM: 9 mg/dL (ref 8.6–10.2)
CHLORIDE: 101 mmol/L (ref 96–106)
CO2: 29 mmol/L (ref 18–29)
Creatinine, Ser: 1.04 mg/dL (ref 0.76–1.27)
GFR, EST AFRICAN AMERICAN: 90 mL/min/{1.73_m2} (ref >59)
GFR, EST NON AFRICAN AMERICAN: 78 mL/min/{1.73_m2} (ref >59)
GLUCOSE: 104 mg/dL — AB (ref 65–99)
Globulin, Total: 1.9 g/dL (ref 1.5–4.5)
POTASSIUM: 3.6 mmol/L (ref 3.5–5.2)
Sodium: 141 mmol/L (ref 134–144)
TOTAL PROTEIN: 6.3 g/dL (ref 6.0–8.5)

## 2015-12-20 LAB — LIPID PANEL WITH LDL/HDL RATIO
Cholesterol, Total: 153 mg/dL (ref 100–199)
HDL: 48 mg/dL (ref >39)
LDL Calculated: 86 mg/dL (ref 0–99)
LDL/HDL RATIO: 1.8 ratio (ref 0.0–3.6)
Triglycerides: 95 mg/dL (ref 0–149)
VLDL Cholesterol Cal: 19 mg/dL (ref 5–40)

## 2015-12-20 LAB — TSH: TSH: 1.16 u[IU]/mL (ref 0.450–4.500)

## 2015-12-21 ENCOUNTER — Other Ambulatory Visit: Payer: BLUE CROSS/BLUE SHIELD

## 2015-12-21 DIAGNOSIS — E291 Testicular hypofunction: Secondary | ICD-10-CM

## 2015-12-21 DIAGNOSIS — N400 Enlarged prostate without lower urinary tract symptoms: Secondary | ICD-10-CM

## 2015-12-22 LAB — CBC WITH DIFFERENTIAL/PLATELET
BASOS: 0 %
Basophils Absolute: 0 10*3/uL (ref 0.0–0.2)
EOS (ABSOLUTE): 0.1 10*3/uL (ref 0.0–0.4)
EOS: 1 %
HEMATOCRIT: 40 % (ref 37.5–51.0)
HEMOGLOBIN: 13.6 g/dL (ref 12.6–17.7)
Immature Grans (Abs): 0 10*3/uL (ref 0.0–0.1)
Immature Granulocytes: 0 %
LYMPHS ABS: 1.4 10*3/uL (ref 0.7–3.1)
Lymphs: 27 %
MCH: 31.9 pg (ref 26.6–33.0)
MCHC: 34 g/dL (ref 31.5–35.7)
MCV: 94 fL (ref 79–97)
MONOS ABS: 0.3 10*3/uL (ref 0.1–0.9)
Monocytes: 6 %
Neutrophils Absolute: 3.3 10*3/uL (ref 1.4–7.0)
Neutrophils: 66 %
Platelets: 188 10*3/uL (ref 150–379)
RBC: 4.26 x10E6/uL (ref 4.14–5.80)
RDW: 13.3 % (ref 12.3–15.4)
WBC: 5 10*3/uL (ref 3.4–10.8)

## 2015-12-22 LAB — PSA: Prostate Specific Ag, Serum: 0.5 ng/mL (ref 0.0–4.0)

## 2015-12-22 LAB — TESTOSTERONE: Testosterone: 605 ng/dL (ref 264–916)

## 2015-12-28 ENCOUNTER — Encounter: Payer: Self-pay | Admitting: Urology

## 2015-12-28 ENCOUNTER — Ambulatory Visit (INDEPENDENT_AMBULATORY_CARE_PROVIDER_SITE_OTHER): Payer: BLUE CROSS/BLUE SHIELD | Admitting: Urology

## 2015-12-28 VITALS — BP 132/74 | HR 66 | Ht 72.0 in | Wt 209.5 lb

## 2015-12-28 DIAGNOSIS — E291 Testicular hypofunction: Secondary | ICD-10-CM

## 2015-12-28 DIAGNOSIS — N400 Enlarged prostate without lower urinary tract symptoms: Secondary | ICD-10-CM

## 2015-12-28 NOTE — Progress Notes (Signed)
12/28/2015 11:35 AM   Precious Haws 04/17/1955 [id number]  Referring provider: Maple Hudson., MD [address] [place], [place] [address]  Chief Complaint  Patient presents with  . Hypogonadism    Follow up    HPI: The patient is a 60 year old gentleman with a past medical history of BPH on Avodart and tamsulosin and hypogonadism on axiron presents for follow-up.The patient is doing well since his last seen. He has no complaints at this time. He is very pleased with his urinary status. He has occasionally a weak stream but no other complaints. Denies nocturia.  He is also Axiron for hypogonadism. He feels like he has a good sex drive with no fatigue.    T: 605 PSA: 0.5 Hct: 40.2 PSA: 0.5   PMH: Past Medical History:  Diagnosis Date  . Allergy   . Asthma   . BP (high blood pressure) 11/07/2014   Should stop combination of Tekturna (DRI)  and ARB   . Hypertension   . HYPERTENSION, SEVERE 03/15/2007   Qualifier: Diagnosis of  By: Vernie Murders    . Prostate enlargement     Surgical History: Past Surgical History:  Procedure Laterality Date  . APPENDECTOMY  1990   Dr Lemar Livings  . COLONOSCOPY  2008   Dr Lemar Livings  . HEMORRHOID SURGERY    . HERNIA REPAIR  01/20/14   umbilical hernia  . KNEE ARTHROSCOPY Left   . KNEE SURGERY Left 1986  . TONSILLECTOMY    . VASECTOMY      Home Medications:    Medication List       Accurate as of 12/28/15 11:35 AM. Always use your most recent med list.          ALAWAY 0.025 % ophthalmic solution Generic drug:  ketotifen Place 1 drop into both eyes daily. 1 drop 2 (two) times daily.   calcium citrate 950 MG tablet Commonly known as:  CALCITRATE - dosed in mg elemental calcium Take 200 mg of elemental calcium by mouth daily.   CIALIS 5 MG tablet Generic drug:  tadalafil TAKE 1 TABLET BY MOUTH EVERY DAY AS NEEDED ERECTILE DYSFUNCTION   D 2000 2000 units Tabs Generic drug:   Cholecalciferol Take by mouth daily.   DEXILANT 60 MG capsule Generic drug:  dexlansoprazole TAKE 1 CAPSULE EVERY DAY   dutasteride 0.5 MG capsule Commonly known as:  AVODART TAKE ONE CAPSULE BY MOUTH EVERY DAY   DYMISTA 137-50 MCG/ACT Susp Generic drug:  Azelastine-Fluticasone 2 (two) times daily. by Nasal route.   Fish Oil 1000 MG Caps Take by mouth daily.   hydrochlorothiazide 25 MG tablet Commonly known as:  HYDRODIURIL Take 1 tablet (25 mg total) by mouth daily.   MULTI-VITAMINS Tabs Take by mouth daily.   naproxen 500 MG tablet Commonly known as:  NAPROSYN TAKE 1 TABLET BY MOUTH TWICE A DAY AS NEEDED FOR PAIN   ranitidine 300 MG capsule Commonly known as:  ZANTAC Take 300 mg by mouth every evening.   SPIRIVA RESPIMAT 1.25 MCG/ACT Aers Generic drug:  Tiotropium Bromide Monohydrate Inhale into the lungs.   SYMBICORT 160-4.5 MCG/ACT inhaler Generic drug:  budesonide-formoterol Inhale 2 puffs into the lungs 2 (two) times daily.   tamsulosin 0.4 MG Caps capsule Commonly known as:  FLOMAX TAKE 1 CAPSULE BY MOUTH DAILY   Testosterone 30 MG/ACT Soln Commonly known as:  AXIRON One swipe per axilla daily   valsartan 320 MG tablet Commonly known as:  DIOVAN  TAKE 1 TABLET EVERY DAY   verapamil 240 MG CR tablet Commonly known as:  CALAN-SR Take 1 tablet (240 mg total) by mouth 2 (two) times daily.       Allergies:  Allergies  Allergen Reactions  . Hydralazine Hives    Chest tightness, severe headache, dyspnea  . Moxifloxacin Hives    Avelox  . Pseudoephedrine Other (See Comments)    Confusion, passed out, numbness  . Pseudoephedrine Hcl     Other reaction(s): Syncope  . Sulfa Antibiotics Hives  . Sulfonamide Derivatives Hives    Family History: Family History  Problem Relation Age of Onset  . Cancer Mother     ovarian  . Glaucoma Mother   . Cancer Father     prostate  . Hypertension Sister   . Asthma Sister   . Alcohol abuse Paternal Uncle    . Liver disease Paternal Uncle   . Cancer - Prostate Paternal Uncle   . Cancer Maternal Uncle     lung; two mat uncles  . Cancer Paternal Grandmother     breast  . Cancer Paternal Aunt     lung cancer in a smoker    Social History:  reports that he has never smoked. He has never used smokeless tobacco. He reports that he drinks alcohol. He reports that he does not use drugs.  ROS: UROLOGY Frequent Urination?: No Hard to postpone urination?: No Burning/pain with urination?: No Get up at night to urinate?: No Leakage of urine?: No Urine stream starts and stops?: No Trouble starting stream?: No Do you have to strain to urinate?: No Blood in urine?: No Urinary tract infection?: No Sexually transmitted disease?: No Injury to kidneys or bladder?: No Painful intercourse?: No Weak stream?: Yes Erection problems?: No Penile pain?: No  Gastrointestinal Nausea?: No Vomiting?: No Indigestion/heartburn?: No Diarrhea?: No Constipation?: No  Constitutional Fever: No Night sweats?: No Weight loss?: No Fatigue?: No  Skin Skin rash/lesions?: No Itching?: No  Eyes Blurred vision?: No Double vision?: No  Ears/Nose/Throat Sore throat?: No Sinus problems?: No  Hematologic/Lymphatic Swollen glands?: No Easy bruising?: Yes  Cardiovascular Leg swelling?: No Chest pain?: No  Respiratory Cough?: Yes Shortness of breath?: No  Endocrine Excessive thirst?: No  Musculoskeletal Back pain?: No Joint pain?: Yes  Neurological Headaches?: No Dizziness?: No  Psychologic Depression?: No Anxiety?: No  Physical Exam: BP 132/74 (BP Location: Left Arm, Patient Position: Sitting, Cuff Size: Normal)   Pulse 66   Ht 6' (1.829 m)   Wt 209 lb 8 oz (95 kg)   BMI 28.41 kg/m   Constitutional:  Alert and oriented, No acute distress. HEENT: [place] AT, moist mucus membranes.  Trachea midline, no masses. Cardiovascular: No clubbing, cyanosis, or edema. Respiratory: Normal  respiratory effort, no increased work of breathing. GI: Abdomen is soft, nontender, nondistended, no abdominal masses GU: No CVA tenderness. DRE: 2+ smooth benign Skin: No rashes, bruises or suspicious lesions. Lymph: No cervical or inguinal adenopathy. Neurologic: Grossly intact, no focal deficits, moving all 4 extremities. Psychiatric: Normal mood and affect.  Laboratory Data: Lab Results  Component Value Date   WBC 5.0 12/21/2015   HCT 40.0 12/21/2015   MCV 94 12/21/2015   PLT 188 12/21/2015    Lab Results  Component Value Date   CREATININE 1.04 12/19/2015    No results found for: PSA  Lab Results  Component Value Date   TESTOSTERONE 605 12/21/2015    No results found for: HGBA1C  Urinalysis  Component Value Date/Time   APPEARANCEUR Clear 06/20/2015 0833   GLUCOSEU Negative 06/20/2015 0833   BILIRUBINUR neg 12/19/2015 0926   BILIRUBINUR Negative 06/20/2015 0833   PROTEINUR neg 12/19/2015 0926   PROTEINUR Negative 06/20/2015 0833   UROBILINOGEN 0.2 12/19/2015 0926   NITRITE neg 12/19/2015 0926   NITRITE Negative 06/20/2015 0833   LEUKOCYTESUR Negative 12/19/2015 0926   LEUKOCYTESUR Negative 06/20/2015 0833    Assessment & Plan:   1. BPH -continue avodart, flomax  2. Hypogonadism -continue axiron - Lab visit for CBC, Testosterone in 3 months -Follow up in 6 months for CBC, testosterone, and PSA prior. We'll perform a DRE at that time.  Hildred LaserBrian James Dawood Spitler, MD  [facility] [address], [address] [place], [place] [address] [phone]

## 2015-12-31 ENCOUNTER — Other Ambulatory Visit: Payer: Self-pay

## 2015-12-31 MED ORDER — VERAPAMIL HCL ER 240 MG PO TBCR: 240.0000 mg | EXTENDED_RELEASE_TABLET | Freq: Two times a day (BID) | ORAL | 3 refills | Status: DC

## 2015-12-31 MED ORDER — RANITIDINE HCL 300 MG PO CAPS: 300.0000 mg | ORAL_CAPSULE | Freq: Every evening | ORAL | 3 refills | Status: DC

## 2016-03-16 ENCOUNTER — Other Ambulatory Visit: Payer: Self-pay | Admitting: Urology

## 2016-04-01 ENCOUNTER — Other Ambulatory Visit: Payer: BLUE CROSS/BLUE SHIELD

## 2016-04-01 DIAGNOSIS — E291 Testicular hypofunction: Secondary | ICD-10-CM

## 2016-04-02 LAB — CBC WITH DIFFERENTIAL
BASOS ABS: 0 10*3/uL (ref 0.0–0.2)
Basos: 0 %
EOS (ABSOLUTE): 0.1 10*3/uL (ref 0.0–0.4)
Eos: 1 %
HEMOGLOBIN: 13.9 g/dL (ref 13.0–17.7)
Hematocrit: 40.9 % (ref 37.5–51.0)
IMMATURE GRANS (ABS): 0 10*3/uL (ref 0.0–0.1)
IMMATURE GRANULOCYTES: 0 %
LYMPHS ABS: 1.9 10*3/uL (ref 0.7–3.1)
LYMPHS: 29 %
MCH: 32.8 pg (ref 26.6–33.0)
MCHC: 34 g/dL (ref 31.5–35.7)
MCV: 97 fL (ref 79–97)
MONOCYTES: 7 %
Monocytes Absolute: 0.5 10*3/uL (ref 0.1–0.9)
NEUTROS PCT: 63 %
Neutrophils Absolute: 4.1 10*3/uL (ref 1.4–7.0)
RBC: 4.24 x10E6/uL (ref 4.14–5.80)
RDW: 13.6 % (ref 12.3–15.4)
WBC: 6.6 10*3/uL (ref 3.4–10.8)

## 2016-04-02 LAB — TESTOSTERONE: TESTOSTERONE: 675 ng/dL (ref 264–916)

## 2016-04-02 LAB — PSA: PROSTATE SPECIFIC AG, SERUM: 0.6 ng/mL (ref 0.0–4.0)

## 2016-04-10 ENCOUNTER — Telehealth: Payer: Self-pay

## 2016-04-10 NOTE — Telephone Encounter (Signed)
[name] LaserBrian James Budzyn, MD  [name], LPN        Please let patient know his CBC, PSA, and testosterone levels are good.   Spoke with pt in reference to lab results. Pt voiced understanding.

## 2016-04-26 ENCOUNTER — Other Ambulatory Visit: Payer: Self-pay | Admitting: Family Medicine

## 2016-05-15 ENCOUNTER — Telehealth: Payer: Self-pay

## 2016-05-15 DIAGNOSIS — E291 Testicular hypofunction: Secondary | ICD-10-CM

## 2016-05-15 MED ORDER — TESTOSTERONE 30 MG/ACT TD SOLN: TRANSDERMAL | 5 refills | Status: DC

## 2016-05-15 NOTE — Telephone Encounter (Signed)
Per Dr. Sherryl BartersBudzyn refills were given. Script faxed to pharmacy.

## 2016-05-15 NOTE — Telephone Encounter (Signed)
Pt called requesting a refill of testosterone. Pt had labs done in Jan.

## 2016-06-16 ENCOUNTER — Encounter: Payer: Self-pay | Admitting: General Surgery

## 2016-06-16 ENCOUNTER — Ambulatory Visit (INDEPENDENT_AMBULATORY_CARE_PROVIDER_SITE_OTHER): Payer: BLUE CROSS/BLUE SHIELD | Admitting: General Surgery

## 2016-06-16 VITALS — BP 126/72 | HR 78 | Resp 12 | Ht 72.0 in | Wt 212.0 lb

## 2016-06-16 DIAGNOSIS — Z1211 Encounter for screening for malignant neoplasm of colon: Secondary | ICD-10-CM

## 2016-06-16 MED ORDER — SCOPOLAMINE 1 MG/3DAYS TD PT72: 1.0000 | MEDICATED_PATCH | TRANSDERMAL | 12 refills | Status: DC

## 2016-06-16 MED ORDER — POLYETHYLENE GLYCOL 3350 17 GM/SCOOP PO POWD: ORAL | 0 refills | Status: DC

## 2016-06-16 NOTE — Progress Notes (Signed)
Patient ID: [name] [name], male   DOB: [birth year], 61 y.o.   MRN: [id number]  Chief Complaint  Patient presents with  . Colonoscopy    HPI [name] [name] is a 61 y.o. male is here today for an evaluation of a colonoscopy. His last one was done in 2008. He reports no gastrointestinal issues. Moves bowels daily.  HPI  Past Medical History:  Diagnosis Date  . Allergy   . Asthma   . BP (high blood pressure) 11/07/2014   Should stop combination of Tekturna (DRI)  and ARB   . Hypertension   . HYPERTENSION, SEVERE 03/15/2007   Qualifier: Diagnosis of  By: Vernie Murders    . Prostate enlargement     Past Surgical History:  Procedure Laterality Date  . APPENDECTOMY  1990   Dr [name] [name]  . COLONOSCOPY  2008   Dr [name] [name]  . HEMORRHOID SURGERY    . HERNIA REPAIR  01/20/14   umbilical hernia  . KNEE ARTHROSCOPY Left   . KNEE SURGERY Left 1986  . TONSILLECTOMY    . VASECTOMY      Family History  Problem Relation Age of Onset  . Cancer Mother     ovarian  . Glaucoma Mother   . Cancer Father     prostate  . Hypertension Sister   . Asthma Sister   . Alcohol abuse Paternal Uncle   . Liver disease Paternal Uncle   . Cancer - Prostate Paternal Uncle   . Cancer Maternal Uncle     lung; two mat uncles  . Cancer Paternal Grandmother     breast  . Cancer Paternal Aunt     lung cancer in a smoker    Social History Social History  Substance Use Topics  . Smoking status: Never Smoker  . Smokeless tobacco: Never Used     Comment: Exposed to second hand smoke as a child  . Alcohol use 0.0 oz/week     Comment: occasionally    Allergies  Allergen Reactions  . Hydralazine Hives    Chest tightness, severe headache, dyspnea  . Moxifloxacin Hives    Avelox  . Pseudoephedrine Other (See Comments)    Confusion, passed out, numbness  . Pseudoephedrine Hcl     Other reaction(s): Syncope  . Sulfa Antibiotics Hives  . Sulfonamide Derivatives Hives    Current  Outpatient Prescriptions  Medication Sig Dispense Refill  . Azelastine-Fluticasone (DYMISTA) 137-50 MCG/ACT SUSP 2 (two) times daily. by Nasal route.    . budesonide-formoterol (SYMBICORT) 160-4.5 MCG/ACT inhaler Inhale 2 puffs into the lungs 2 (two) times daily.    . calcium citrate (CALCITRATE - DOSED IN MG ELEMENTAL CALCIUM) 950 MG tablet Take 200 mg of elemental calcium by mouth daily.    . Cholecalciferol (D 2000) 2000 UNITS TABS Take by mouth daily.     Marland Kitchen CIALIS 5 MG tablet TAKE 1 TABLET BY MOUTH EVERY DAY AS NEEDED ERECTILE DYSFUNCTION 30 tablet 3  . DEXILANT 60 MG capsule TAKE 1 CAPSULE EVERY DAY 30 capsule 11  . dutasteride (AVODART) 0.5 MG capsule TAKE ONE CAPSULE BY MOUTH EVERY DAY 30 capsule 11  . EPIPEN 2-PAK 0.3 MG/0.3ML SOAJ injection USE AS DIRECTED AS NEEDED  1  . hydrochlorothiazide (HYDRODIURIL) 25 MG tablet Take 1 tablet (25 mg total) by mouth daily. 90 tablet 3  . hydrochlorothiazide (HYDRODIURIL) 25 MG tablet Take by mouth.    Marland Kitchen ketotifen (ALAWAY) 0.025 % ophthalmic solution Place 1 drop into both eyes daily.  1 drop 2 (two) times daily.    . Multiple Vitamin (MULTI-VITAMINS) TABS Take by mouth daily.     . naproxen (NAPROSYN) 500 MG tablet TAKE 1 TABLET BY MOUTH TWICE A DAY AS NEEDED FOR PAIN 60 tablet 12  . Omega-3 Fatty Acids (FISH OIL) 1000 MG CAPS Take by mouth daily.     . ranitidine (ZANTAC) 300 MG capsule Take 1 capsule (300 mg total) by mouth every evening. 90 capsule 3  . ranitidine (ZANTAC) 300 MG tablet Take by mouth.    . tamsulosin (FLOMAX) 0.4 MG CAPS capsule TAKE 1 CAPSULE BY MOUTH DAILY 30 capsule 11  . Testosterone (AXIRON) 30 MG/ACT SOLN One swipe per axilla daily 60 mL 5  . Tiotropium Bromide Monohydrate (SPIRIVA RESPIMAT) 1.25 MCG/ACT AERS Inhale into the lungs.    . valsartan (DIOVAN) 320 MG tablet TAKE 1 TABLET EVERY DAY 30 tablet 12  . verapamil (CALAN-SR) 240 MG CR tablet Take 1 tablet (240 mg total) by mouth 2 (two) times daily. 180 tablet 3  .  polyethylene glycol powder (GLYCOLAX/MIRALAX) powder 255 grams one bottle for colonoscopy prep 255 g 0  . scopolamine (TRANSDERM-SCOP, 1.5 MG,) 1 MG/3DAYS Place 1 patch (1.5 mg total) onto the skin every 3 (three) days. 10 patch 12   No current facility-administered medications for this visit.     Review of Systems Review of Systems  Constitutional: Negative.   Respiratory: Negative.   Cardiovascular: Negative.   Gastrointestinal: Negative.     Blood pressure 126/72, pulse 78, resp. rate 12, height 6' (1.829 m), weight 212 lb (96.2 kg).  Physical Exam Physical Exam  Constitutional: He is oriented to person, place, and time. He appears well-developed and well-nourished.  Cardiovascular: Normal rate, regular rhythm and normal heart sounds.   Pulmonary/Chest: Effort normal and breath sounds normal.  Abdominal: Soft. Bowel sounds are normal.  Neurological: He is alert and oriented to person, place, and time.  Skin: Skin is warm and dry.    Data Reviewed Pathology from the 07/13/2006 exam showed hyperplastic lymphoid aggregate at the appendiceal orifice, hyperplastic polyp from the rectum.  Assessment    Candidate for screening colonoscopy.    Plan     Colonoscopy with possible biopsy/polypectomy prn: Information regarding the procedure, including its potential risks and complications (including but not limited to perforation of the bowel, which may require emergency surgery to repair, and bleeding) was verbally given to the patient. Educational information regarding lower intestinal endoscopy was given to the patient. Written instructions for how to complete the bowel prep using Miralax were provided. The importance of drinking ample fluids to avoid dehydration as a result of the prep emphasized.  HPI, Physical Exam, Assessment and Plan have been scribed under the direction and in the presence of [name] Curry, MD.  [name] [name], CMA  I have completed the exam and reviewed  the above documentation for accuracy and completeness.  I agree with the above.  Museum/gallery conservator has been used and any errors in dictation or transcription are unintentional.  [name] [name], M.D., F.A.C.S.        [name] [name] 06/16/2016, 9:26 PM   Patient has been scheduled for a colonoscopy on 08-13-16 at [facility]. This patient has been asked to discontinue fish oil one week prior to procedure.

## 2016-06-16 NOTE — Patient Instructions (Signed)
Colonoscopy, Adult A colonoscopy is an exam to look at the entire large intestine. During the exam, a lubricated, bendable tube is inserted into the anus and then passed into the rectum, colon, and other parts of the large intestine. A colonoscopy is often done as a part of normal colorectal screening or in response to certain symptoms, such as anemia, persistent diarrhea, abdominal pain, and blood in the stool. The exam can help screen for and diagnose medical problems, including:  Tumors.  Polyps.  Inflammation.  Areas of bleeding. Tell a health care provider about:  Any allergies you have.  All medicines you are taking, including vitamins, herbs, eye drops, creams, and over-the-counter medicines.  Any problems you or family members have had with anesthetic medicines.  Any blood disorders you have.  Any surgeries you have had.  Any medical conditions you have.  Any problems you have had passing stool. What are the risks? Generally, this is a safe procedure. However, problems may occur, including:  Bleeding.  A tear in the intestine.  A reaction to medicines given during the exam.  Infection (rare). What happens before the procedure? Eating and drinking restrictions  Follow instructions from your health care provider about eating and drinking, which may include:  A few days before the procedure - follow a low-fiber diet. Avoid nuts, seeds, dried fruit, raw fruits, and vegetables.  1-3 days before the procedure - follow a clear liquid diet. Drink only clear liquids, such as clear broth or bouillon, black coffee or tea, clear juice, clear soft drinks or sports drinks, gelatin dessert, and popsicles. Avoid any liquids that contain red or purple dye.  On the day of the procedure - do not eat or drink anything during the 2 hours before the procedure, or within the time period that your health care provider recommends. Bowel prep  If you were prescribed an oral bowel prep to  clean out your colon:  Take it as told by your health care provider. Starting the day before your procedure, you will need to drink a large amount of medicated liquid. The liquid will cause you to have multiple loose stools until your stool is almost clear or light green.  If your skin or anus gets irritated from diarrhea, you may use these to relieve the irritation:  Medicated wipes, such as adult wet wipes with aloe and vitamin E.  A skin soothing-product like petroleum jelly.  If you vomit while drinking the bowel prep, take a break for up to 60 minutes and then begin the bowel prep again. If vomiting continues and you cannot take the bowel prep without vomiting, call your health care provider. General instructions   Ask your health care provider about changing or stopping your regular medicines. This is especially important if you are taking diabetes medicines or blood thinners.  Plan to have someone take you home from the hospital or clinic. What happens during the procedure?  An IV tube may be inserted into one of your veins.  You will be given medicine to help you relax (sedative).  To reduce your risk of infection:  Your health care team will wash or sanitize their hands.  Your anal area will be washed with soap.  You will be asked to lie on your side with your knees bent.  Your health care provider will lubricate a long, thin, flexible tube. The tube will have a camera and a light on the end.  The tube will be inserted into your anus.    The tube will be gently eased through your rectum and colon.  Air will be delivered into your colon to keep it open. You may feel some pressure or cramping.  The camera will be used to take images during the procedure.  A small tissue sample may be removed from your body to be examined under a microscope (biopsy). If any potential problems are found, the tissue will be sent to a lab for testing.  If small polyps are found, your health  care provider may remove them and have them checked for cancer cells.  The tube that was inserted into your anus will be slowly removed. The procedure may vary among health care providers and hospitals. What happens after the procedure?  Your blood pressure, heart rate, breathing rate, and blood oxygen level will be monitored until the medicines you were given have worn off.  Do not drive for 24 hours after the exam.  You may have a small amount of blood in your stool.  You may pass gas and have mild abdominal cramping or bloating due to the air that was used to inflate your colon during the exam.  It is up to you to get the results of your procedure. Ask your health care provider, or the department performing the procedure, when your results will be ready. This information is not intended to replace advice given to you by your health care provider. Make sure you discuss any questions you have with your health care provider. Document Released: 02/22/2000 Document Revised: 12/26/2015 Document Reviewed: 05/08/2015 Elsevier Interactive Patient Education  2017 [employer]  

## 2016-06-18 ENCOUNTER — Ambulatory Visit (INDEPENDENT_AMBULATORY_CARE_PROVIDER_SITE_OTHER): Payer: BLUE CROSS/BLUE SHIELD | Admitting: Family Medicine

## 2016-06-18 ENCOUNTER — Encounter: Payer: Self-pay | Admitting: Family Medicine

## 2016-06-18 VITALS — BP 132/82 | HR 72 | Temp 98.0°F | Resp 16 | Wt 214.0 lb

## 2016-06-18 DIAGNOSIS — R05 Cough: Secondary | ICD-10-CM | POA: Diagnosis not present

## 2016-06-18 DIAGNOSIS — K219 Gastro-esophageal reflux disease without esophagitis: Secondary | ICD-10-CM

## 2016-06-18 DIAGNOSIS — M25561 Pain in right knee: Secondary | ICD-10-CM | POA: Diagnosis not present

## 2016-06-18 DIAGNOSIS — I10 Essential (primary) hypertension: Secondary | ICD-10-CM

## 2016-06-18 DIAGNOSIS — M25562 Pain in left knee: Secondary | ICD-10-CM | POA: Diagnosis not present

## 2016-06-18 DIAGNOSIS — R059 Cough, unspecified: Secondary | ICD-10-CM

## 2016-06-18 NOTE — Progress Notes (Signed)
Patient: [name] [name] Male    DOB: 21-Sep-1955   61 y.o.   MRN: [id number] Visit Date: 06/18/2016  Today's Provider: Megan Mans, MD   Chief Complaint  Patient presents with  . Hypertension  . Gastroesophageal Reflux   Subjective:    HPI      Hypertension, follow-up:  BP Readings from Last 3 Encounters:  06/18/16 132/82  06/16/16 126/72  12/28/15 132/74    He was last seen for hypertension 6 months ago.  BP at that visit was 144/80. Management since that visit includes none. He reports excellent compliance with treatment. He is not having side effects.  He is exercising 3-4 times weekly. Swims or uses elliptical and weight training. He is adherent to low salt diet.   Outside blood pressures are not currently being checked. He is experiencing lower extremity edema (at the end of the day).  Patient denies chest pain, chest pressure/discomfort, claudication, dyspnea, exertional chest pressure/discomfort, fatigue, irregular heart beat, near-syncope, orthopnea, palpitations and syncope.   Cardiovascular risk factors include advanced age (older than 45 for men, 65 for women), hypertension and male gender.      Weight trend: increasing steadily Wt Readings from Last 3 Encounters:  06/18/16 214 lb (97.1 kg)  06/16/16 212 lb (96.2 kg)  12/28/15 209 lb 8 oz (95 kg)    Current diet: in general, a "healthy" diet    ------------------------------------------------------------------------  GERD, Follow up:  The patient was last seen for GERD 6 months ago. Changes made since that visit include continuing medications.  He reports excellent compliance with treatment. He is not having side effects.  He is NOT experiencing any reflux sx, except when drinking water before bed due to hernia.  ------------------------------------------------------------------------    Allergies  Allergen Reactions  . Hydralazine Hives    Chest tightness, severe  headache, dyspnea  . Moxifloxacin Hives    Avelox  . Pseudoephedrine Other (See Comments)    Confusion, passed out, numbness  . Pseudoephedrine Hcl     Other reaction(s): Syncope  . Sulfa Antibiotics Hives  . Sulfonamide Derivatives Hives     Current Outpatient Prescriptions:  .  Azelastine-Fluticasone (DYMISTA) 137-50 MCG/ACT SUSP, 2 (two) times daily. by Nasal route., Disp: , Rfl:  .  budesonide-formoterol (SYMBICORT) 160-4.5 MCG/ACT inhaler, Inhale 2 puffs into the lungs 2 (two) times daily., Disp: , Rfl:  .  calcium citrate (CALCITRATE - DOSED IN MG ELEMENTAL CALCIUM) 950 MG tablet, Take 200 mg of elemental calcium by mouth daily., Disp: , Rfl:  .  cetirizine (ZYRTEC) 5 MG tablet, Take 5 mg by mouth daily., Disp: , Rfl:  .  Cholecalciferol (D 2000) 2000 UNITS TABS, Take by mouth daily. , Disp: , Rfl:  .  CIALIS 5 MG tablet, TAKE 1 TABLET BY MOUTH EVERY DAY AS NEEDED ERECTILE DYSFUNCTION, Disp: 30 tablet, Rfl: 3 .  DEXILANT 60 MG capsule, TAKE 1 CAPSULE EVERY DAY, Disp: 30 capsule, Rfl: 11 .  dextromethorphan-guaiFENesin (MUCINEX DM) 30-600 MG 12hr tablet, Take 1 tablet by mouth 2 (two) times daily as needed for cough., Disp: , Rfl:  .  dutasteride (AVODART) 0.5 MG capsule, TAKE ONE CAPSULE BY MOUTH EVERY DAY, Disp: 30 capsule, Rfl: 11 .  EPIPEN 2-PAK 0.3 MG/0.3ML SOAJ injection, USE AS DIRECTED AS NEEDED, Disp: , Rfl: 1 .  glucosamine-chondroitin 500-400 MG tablet, Take 1 tablet by mouth 2 (two) times daily., Disp: , Rfl:  .  hydrochlorothiazide (HYDRODIURIL) 25 MG tablet, Take  1 tablet (25 mg total) by mouth daily., Disp: 90 tablet, Rfl: 3 .  Multiple Vitamin (MULTI-VITAMINS) TABS, Take by mouth daily. , Disp: , Rfl:  .  naproxen (NAPROSYN) 500 MG tablet, TAKE 1 TABLET BY MOUTH TWICE A DAY AS NEEDED FOR PAIN, Disp: 60 tablet, Rfl: 12 .  ranitidine (ZANTAC) 300 MG capsule, Take 1 capsule (300 mg total) by mouth every evening., Disp: 90 capsule, Rfl: 3 .  tamsulosin (FLOMAX) 0.4 MG  CAPS capsule, TAKE 1 CAPSULE BY MOUTH DAILY, Disp: 30 capsule, Rfl: 11 .  Testosterone (AXIRON) 30 MG/ACT SOLN, One swipe per axilla daily, Disp: 60 mL, Rfl: 5 .  Turmeric 500 MG CAPS, Take 1 capsule by mouth daily., Disp: , Rfl:  .  valsartan (DIOVAN) 320 MG tablet, TAKE 1 TABLET EVERY DAY, Disp: 30 tablet, Rfl: 12 .  verapamil (CALAN-SR) 240 MG CR tablet, Take 1 tablet (240 mg total) by mouth 2 (two) times daily., Disp: 180 tablet, Rfl: 3 .  ketotifen (ALAWAY) 0.025 % ophthalmic solution, Place 1 drop into both eyes daily. 1 drop 2 (two) times daily., Disp: , Rfl:  .  polyethylene glycol powder (GLYCOLAX/MIRALAX) powder, 255 grams one bottle for colonoscopy prep (Patient not taking: Reported on 06/18/2016), Disp: 255 g, Rfl: 0 .  scopolamine (TRANSDERM-SCOP, 1.5 MG,) 1 MG/3DAYS, Place 1 patch (1.5 mg total) onto the skin every 3 (three) days. (Patient not taking: Reported on 06/18/2016), Disp: 10 patch, Rfl: 12  Review of Systems  Constitutional: Negative for activity change, appetite change, chills, diaphoresis, fatigue, fever and unexpected weight change.  HENT: Positive for postnasal drip.   Eyes: Negative.   Respiratory: Positive for cough (F/B pulmonology. Is taking Mucinex and antihistamine.). Negative for shortness of breath.   Cardiovascular: Negative for chest pain, palpitations and leg swelling.  Gastrointestinal: Negative for abdominal pain.  Genitourinary: Negative.   Musculoskeletal: Positive for arthralgias (Bilateral knee pain. F/B Dr. Joice Lofts.).  Neurological: Negative.   Psychiatric/Behavioral: Negative.     Social History  Substance Use Topics  . Smoking status: Never Smoker  . Smokeless tobacco: Never Used     Comment: Exposed to second hand smoke as a child  . Alcohol use 0.0 oz/week     Comment: occasionally   Objective:   BP 132/82 (BP Location: Right Arm, Cuff Size: Large)   Pulse 72   Temp 98 F (36.7 C) (Oral)   Resp 16   Wt 214 lb (97.1 kg)   BMI 29.02  kg/m  Vitals:   06/18/16 0807 06/18/16 0819  BP: (!) 144/80 132/82  Pulse: 72   Resp: 16   Temp: 98 F (36.7 C)   TempSrc: Oral   Weight: 214 lb (97.1 kg)      Physical Exam  Constitutional: He is oriented to person, place, and time. He appears well-developed and well-nourished.  HENT:  Head: Normocephalic and atraumatic.  Right Ear: External ear normal.  Left Ear: External ear normal.  Nose: Nose normal.  Eyes: Conjunctivae are normal. No scleral icterus.  Neck: Normal range of motion. Neck supple. No thyromegaly present.  Cardiovascular: Normal rate, regular rhythm and normal heart sounds.   Pulmonary/Chest: Effort normal and breath sounds normal. No respiratory distress.  Abdominal: Soft.  Musculoskeletal: He exhibits no edema.  Lymphadenopathy:    He has no cervical adenopathy.  Neurological: He is alert and oriented to person, place, and time.  Skin: Skin is warm and dry.  Psychiatric: He has a normal mood and affect. His  behavior is normal. Judgment and thought content normal.        Assessment & Plan:     1. Essential (primary) hypertension Stable. Continue medications.  2. Gastroesophageal reflux disease, esophagitis presence not specified Stable. Continue medications.  3. COUGH, CHRONIC F/B pulmonology. Has had this cough for over one year. Pt states it is due to "drainage". Is taking inhalers, children's antihistamine (due to sedation), nasal spray, and taking allergy shots. Also is taking Mucinex. Advised pt to FU with pulmonology for this problem.  4. Pain in both knees, unspecified chronicity F/B ortho. Is taking glucosamine chondroitin and turmeric for this. Also going to PT. Advised pt to perform weight bearing exercises and FU with ortho.     Patient seen and examined by Julieanne Manson, MD, and note scribed by Allene Dillon, CMA. I have done the exam and reviewed the above chart and it is accurate to the best of my knowledge. Dentist  has been used in this note in any air is in the dictation or transcription are unintentional.  Megan Mans, MD  [facility]

## 2016-06-19 ENCOUNTER — Other Ambulatory Visit: Payer: Self-pay | Admitting: Family Medicine

## 2016-06-21 ENCOUNTER — Other Ambulatory Visit: Payer: Self-pay | Admitting: Family Medicine

## 2016-06-23 ENCOUNTER — Other Ambulatory Visit: Payer: Self-pay

## 2016-06-23 DIAGNOSIS — E291 Testicular hypofunction: Secondary | ICD-10-CM

## 2016-06-24 ENCOUNTER — Other Ambulatory Visit: Payer: BLUE CROSS/BLUE SHIELD

## 2016-06-24 DIAGNOSIS — E291 Testicular hypofunction: Secondary | ICD-10-CM

## 2016-06-25 LAB — CBC
HEMATOCRIT: 43.1 % (ref 37.5–51.0)
Hemoglobin: 14.1 g/dL (ref 13.0–17.7)
MCH: 31.8 pg (ref 26.6–33.0)
MCHC: 32.7 g/dL (ref 31.5–35.7)
MCV: 97 fL (ref 79–97)
PLATELETS: 183 10*3/uL (ref 150–379)
RBC: 4.43 x10E6/uL (ref 4.14–5.80)
RDW: 13.2 % (ref 12.3–15.4)
WBC: 5.6 10*3/uL (ref 3.4–10.8)

## 2016-06-25 LAB — TESTOSTERONE: Testosterone: 660 ng/dL (ref 264–916)

## 2016-06-25 LAB — PSA: Prostate Specific Ag, Serum: 0.7 ng/mL (ref 0.0–4.0)

## 2016-07-04 ENCOUNTER — Ambulatory Visit (INDEPENDENT_AMBULATORY_CARE_PROVIDER_SITE_OTHER): Payer: BLUE CROSS/BLUE SHIELD | Admitting: Urology

## 2016-07-04 ENCOUNTER — Encounter: Payer: Self-pay | Admitting: Urology

## 2016-07-04 ENCOUNTER — Other Ambulatory Visit: Payer: Self-pay | Admitting: Family Medicine

## 2016-07-04 VITALS — BP 137/68 | HR 76 | Ht 72.0 in | Wt 215.3 lb

## 2016-07-04 DIAGNOSIS — Z125 Encounter for screening for malignant neoplasm of prostate: Secondary | ICD-10-CM | POA: Diagnosis not present

## 2016-07-04 DIAGNOSIS — E291 Testicular hypofunction: Secondary | ICD-10-CM | POA: Diagnosis not present

## 2016-07-04 DIAGNOSIS — N400 Enlarged prostate without lower urinary tract symptoms: Secondary | ICD-10-CM | POA: Diagnosis not present

## 2016-07-04 NOTE — Progress Notes (Signed)
07/04/2016 2:27 PM   [name] [name] [birth year] [id number]  Referring provider: Maple Hudson., MD [address] [place], [place] [address]  Chief Complaint  Patient presents with  . Benign Prostatic Hypertrophy    HPI: The patient is a 61 year old gentleman with a past medical history of BPH on Avodart and tamsulosin and hypogonadism on axiron presents for follow-up.The patient is doing well since his last seen. He has no complaints at this time. He is very pleased with his urinary status. He has occasionally a weak stream but no other complaints. He has nocturia 1. He feels he empties his bladder. He does not strain. He rates his urinary symptoms is mostly satisfied. His I PSS is 8/2.  He is also Axiron for hypogonadism. He feels like he has a good sex drive with no fatigue.   he has good erections. He feels the medication is working well for him.  T: 660 (Has been in the 600 range on multiple lab draws over the last year. He did have one value in the 400s that was drawn shortly after running out of Axiron) PSA: 0.7 Hct: 40.9 Lipds normal within the last year   PMH: Past Medical History:  Diagnosis Date  . Allergy   . Asthma   . BP (high blood pressure) 11/07/2014   Should stop combination of Tekturna (DRI)  and ARB   . Hypertension   . HYPERTENSION, SEVERE 03/15/2007   Qualifier: Diagnosis of  By: Vernie Murders    . Prostate enlargement     Surgical History: Past Surgical History:  Procedure Laterality Date  . APPENDECTOMY  1990   Dr Lemar Livings  . COLONOSCOPY  2008   Dr Lemar Livings  . HEMORRHOID SURGERY    . HERNIA REPAIR  01/20/14   umbilical hernia  . KNEE ARTHROSCOPY Left   . KNEE SURGERY Left 1986  . TONSILLECTOMY    . VASECTOMY      Home Medications:  Allergies as of 07/04/2016      Reactions   Hydralazine Hives   Chest tightness, severe headache, dyspnea   Moxifloxacin Hives   Avelox   Pseudoephedrine Other (See Comments)   Confusion, passed out, numbness   Pseudoephedrine Hcl    Other reaction(s): Syncope   Sulfa Antibiotics Hives   Sulfonamide Derivatives Hives      Medication List       Accurate as of 07/04/16  2:27 PM. Always use your most recent med list.          ALAWAY 0.025 % ophthalmic solution Generic drug:  ketotifen Place 1 drop into both eyes daily. 1 drop 2 (two) times daily.   calcium citrate 950 MG tablet Commonly known as:  CALCITRATE - dosed in mg elemental calcium Take 200 mg of elemental calcium by mouth daily.   cetirizine 5 MG tablet Commonly known as:  ZYRTEC Take 5 mg by mouth daily.   CIALIS 5 MG tablet Generic drug:  tadalafil TAKE 1 TABLET BY MOUTH EVERY DAY AS NEEDED ERECTILE DYSFUNCTION   D 2000 2000 units Tabs Generic drug:  Cholecalciferol Take by mouth daily.   DEXILANT 60 MG capsule Generic drug:  dexlansoprazole TAKE 1 CAPSULE EVERY DAY   dextromethorphan-guaiFENesin 30-600 MG 12hr tablet Commonly known as:  MUCINEX DM Take 1 tablet by mouth 2 (two) times daily as needed for cough.   dutasteride 0.5 MG capsule Commonly known as:  AVODART TAKE ONE CAPSULE BY MOUTH EVERY DAY   DYMISTA  137-50 MCG/ACT Susp Generic drug:  Azelastine-Fluticasone 2 (two) times daily. by Nasal route.   EPIPEN 2-PAK 0.3 mg/0.3 mL Soaj injection Generic drug:  EPINEPHrine USE AS DIRECTED AS NEEDED   glucosamine-chondroitin 500-400 MG tablet Take 1 tablet by mouth 2 (two) times daily.   hydrochlorothiazide 25 MG tablet Commonly known as:  HYDRODIURIL Take 1 tablet (25 mg total) by mouth daily.   MULTI-VITAMINS Tabs Take by mouth daily.   naproxen 500 MG tablet Commonly known as:  NAPROSYN TAKE 1 TABLET BY MOUTH TWICE A DAY AS NEEDED FOR PAIN   polyethylene glycol powder powder Commonly known as:  GLYCOLAX/MIRALAX 255 grams one bottle for colonoscopy prep   ranitidine 300 MG capsule Commonly known as:  ZANTAC Take 1 capsule (300 mg total) by mouth every  evening.   scopolamine 1 MG/3DAYS Commonly known as:  TRANSDERM-SCOP (1.5 MG) Place 1 patch (1.5 mg total) onto the skin every 3 (three) days.   SYMBICORT 160-4.5 MCG/ACT inhaler Generic drug:  budesonide-formoterol Inhale 2 puffs into the lungs 2 (two) times daily.   tamsulosin 0.4 MG Caps capsule Commonly known as:  FLOMAX TAKE 1 CAPSULE BY MOUTH DAILY   Testosterone 30 MG/ACT Soln Commonly known as:  AXIRON One swipe per axilla daily   Turmeric 500 MG Caps Take 1 capsule by mouth daily.   valsartan 320 MG tablet Commonly known as:  DIOVAN TAKE 1 TABLET EVERY DAY   verapamil 240 MG CR tablet Commonly known as:  CALAN-SR Take 1 tablet (240 mg total) by mouth 2 (two) times daily.       Allergies:  Allergies  Allergen Reactions  . Hydralazine Hives    Chest tightness, severe headache, dyspnea  . Moxifloxacin Hives    Avelox  . Pseudoephedrine Other (See Comments)    Confusion, passed out, numbness  . Pseudoephedrine Hcl     Other reaction(s): Syncope  . Sulfa Antibiotics Hives  . Sulfonamide Derivatives Hives    Family History: Family History  Problem Relation Age of Onset  . Cancer Mother     ovarian  . Glaucoma Mother   . Cancer Father     prostate  . Hypertension Sister   . Asthma Sister   . Alcohol abuse Paternal Uncle   . Liver disease Paternal Uncle   . Cancer - Prostate Paternal Uncle   . Cancer Maternal Uncle     lung; two mat uncles  . Cancer Paternal Grandmother     breast  . Cancer Paternal Aunt     lung cancer in a smoker    Social History:  reports that he has never smoked. He has never used smokeless tobacco. He reports that he drinks alcohol. He reports that he does not use drugs.  ROS: UROLOGY Frequent Urination?: No Hard to postpone urination?: No Burning/pain with urination?: No Get up at night to urinate?: No Leakage of urine?: No Urine stream starts and stops?: No Trouble starting stream?: No Do you have to strain to  urinate?: No Blood in urine?: No Urinary tract infection?: No Sexually transmitted disease?: No Injury to kidneys or bladder?: No Painful intercourse?: No Weak stream?: No Erection problems?: No Penile pain?: No  Gastrointestinal Nausea?: No Vomiting?: No Indigestion/heartburn?: No Diarrhea?: No Constipation?: No  Constitutional Fever: No Night sweats?: No Weight loss?: No Fatigue?: No  Skin Skin rash/lesions?: No Itching?: No  Eyes Blurred vision?: No Double vision?: No  Ears/Nose/Throat Sore throat?: No Sinus problems?: No  Hematologic/Lymphatic Swollen glands?: No Easy  bruising?: No  Cardiovascular Leg swelling?: No Chest pain?: No  Respiratory Cough?: No Shortness of breath?: No  Endocrine Excessive thirst?: No  Musculoskeletal Back pain?: No Joint pain?: No  Neurological Headaches?: No Dizziness?: No  Psychologic Depression?: No Anxiety?: No  Physical Exam: BP 137/68 (BP Location: Left Arm, Patient Position: Sitting, Cuff Size: Normal)   Pulse 76   Ht 6' (1.829 m)   Wt 215 lb 4.8 oz (97.7 kg)   BMI 29.20 kg/m   Constitutional:  Alert and oriented, No acute distress. HEENT: [place] AT, moist mucus membranes.  Trachea midline, no masses. Cardiovascular: No clubbing, cyanosis, or edema. Respiratory: Normal respiratory effort, no increased work of breathing. GI: Abdomen is soft, nontender, nondistended, no abdominal masses GU: No CVA tenderness. DRE: 2+ benign. Skin: No rashes, bruises or suspicious lesions. Lymph: No cervical or inguinal adenopathy. Neurologic: Grossly intact, no focal deficits, moving all 4 extremities. Psychiatric: Normal mood and affect.  Laboratory Data: Lab Results  Component Value Date   WBC 5.6 06/24/2016   HCT 43.1 06/24/2016   MCV 97 06/24/2016   PLT 183 06/24/2016    Lab Results  Component Value Date   CREATININE 1.04 12/19/2015    No results found for: PSA  Lab Results  Component Value Date    TESTOSTERONE 660 06/24/2016    No results found for: HGBA1C  Urinalysis    Component Value Date/Time   APPEARANCEUR Clear 06/20/2015 0833   GLUCOSEU Negative 06/20/2015 0833   BILIRUBINUR neg 12/19/2015 0926   BILIRUBINUR Negative 06/20/2015 0833   PROTEINUR neg 12/19/2015 0926   PROTEINUR Negative 06/20/2015 0833   UROBILINOGEN 0.2 12/19/2015 0926   NITRITE neg 12/19/2015 0926   NITRITE Negative 06/20/2015 0833   LEUKOCYTESUR Negative 12/19/2015 0926   LEUKOCYTESUR Negative 06/20/2015 0833    Assessment & Plan:    1. BPH -continue avodart, flomax  2. Hypogonadism -continue axiron -I discussed with the patient the new American Urological Association guidelines for management of hypogonadism which recommend laboratory testing and follow-up every 6-12 months when stable on testosterone replacement. I think since his labs have been stable for over a year now on testosterone, we can start having him follow-up on an annual basis based on these new guidelines. He'll follow-up in one year with CBC, testosterone, and PSA prior. His PCP appears to be checking his lipids and hepatic panel.  3. Prostate cancer screening -up to date  Return in about 1 year (around 07/04/2017) for PSA, cbc, testosterone prior.  Hildred Laser, MD  [facility] [address] [place], [place] [address] [phone]

## 2016-07-13 ENCOUNTER — Other Ambulatory Visit: Payer: Self-pay | Admitting: Urology

## 2016-07-13 DIAGNOSIS — N401 Enlarged prostate with lower urinary tract symptoms: Secondary | ICD-10-CM

## 2016-07-28 ENCOUNTER — Other Ambulatory Visit: Payer: Self-pay | Admitting: Family Medicine

## 2016-08-07 ENCOUNTER — Encounter: Payer: Self-pay | Admitting: *Deleted

## 2016-08-13 ENCOUNTER — Ambulatory Visit
Admission: RE | Admit: 2016-08-13 | Discharge: 2016-08-13 | Disposition: A | Discharge status: Home / Self Care | Payer: BLUE CROSS/BLUE SHIELD | Source: Ambulatory Visit | Attending: General Surgery | Admitting: General Surgery

## 2016-08-13 ENCOUNTER — Ambulatory Visit: Payer: BLUE CROSS/BLUE SHIELD | Admitting: Anesthesiology

## 2016-08-13 ENCOUNTER — Encounter
Admission: RE | Disposition: A | Discharge status: Home / Self Care | Payer: Self-pay | Source: Ambulatory Visit | Attending: General Surgery

## 2016-08-13 DIAGNOSIS — I10 Essential (primary) hypertension: Secondary | ICD-10-CM | POA: Insufficient documentation

## 2016-08-13 DIAGNOSIS — Z882 Allergy status to sulfonamides status: Secondary | ICD-10-CM | POA: Diagnosis not present

## 2016-08-13 DIAGNOSIS — Z1211 Encounter for screening for malignant neoplasm of colon: Secondary | ICD-10-CM | POA: Insufficient documentation

## 2016-08-13 DIAGNOSIS — K621 Rectal polyp: Secondary | ICD-10-CM | POA: Insufficient documentation

## 2016-08-13 DIAGNOSIS — Z79899 Other long term (current) drug therapy: Secondary | ICD-10-CM | POA: Diagnosis not present

## 2016-08-13 DIAGNOSIS — N400 Enlarged prostate without lower urinary tract symptoms: Secondary | ICD-10-CM | POA: Insufficient documentation

## 2016-08-13 DIAGNOSIS — G4730 Sleep apnea, unspecified: Secondary | ICD-10-CM | POA: Insufficient documentation

## 2016-08-13 DIAGNOSIS — Z888 Allergy status to other drugs, medicaments and biological substances status: Secondary | ICD-10-CM | POA: Diagnosis not present

## 2016-08-13 DIAGNOSIS — J45909 Unspecified asthma, uncomplicated: Secondary | ICD-10-CM | POA: Diagnosis not present

## 2016-08-13 HISTORY — PX: COLONOSCOPY WITH PROPOFOL: SHX5780

## 2016-08-13 SURGERY — COLONOSCOPY WITH PROPOFOL
Anesthesia: General

## 2016-08-13 MED ORDER — SODIUM CHLORIDE 0.9 % IV SOLN
INTRAVENOUS | Status: DC
  Administered 2016-08-13: 08:00:00 via INTRAVENOUS

## 2016-08-13 MED ORDER — SODIUM CHLORIDE 0.9 % IV SOLN
INTRAVENOUS | Status: DC
Start: 2016-08-13 — End: 2016-08-13
  Administered 2016-08-13: 1000 mL via INTRAVENOUS

## 2016-08-13 MED ORDER — PROPOFOL 500 MG/50ML IV EMUL
INTRAVENOUS | Status: AC
  Filled 2016-08-13: qty 50

## 2016-08-13 MED ORDER — PROPOFOL 500 MG/50ML IV EMUL
INTRAVENOUS | Status: DC | PRN
  Administered 2016-08-13: 120 ug/kg/min via INTRAVENOUS

## 2016-08-13 MED ORDER — PROPOFOL 10 MG/ML IV BOLUS
INTRAVENOUS | Status: DC | PRN
  Administered 2016-08-13: 100 mg via INTRAVENOUS

## 2016-08-13 NOTE — Op Note (Signed)
[facility] Gastroenterology Patient Name: [name] [name] Procedure Date: 08/13/2016 8:14 AM MRN: [id number] Account #: [id number] Date of Birth: [birth year] Admit Type: Outpatient Age: 61 Room: [facility] ENDO ROOM 1 Gender: Male Note Status: Finalized Procedure:            Colonoscopy Indications:          Screening for colorectal malignant neoplasm Providers:            Earline Mayotte, MD Referring MD:         Ferdinand Lango. Sullivan Lone, MD (Referring MD) Medicines:            Monitored Anesthesia Care Complications:        No immediate complications. Procedure:            Pre-Anesthesia Assessment:                       - Prior to the procedure, a History and Physical was                        performed, and patient medications, allergies and                        sensitivities were reviewed. The patient's tolerance of                        previous anesthesia was reviewed.                       - The risks and benefits of the procedure and the                        sedation options and risks were discussed with the                        patient. All questions were answered and informed                        consent was obtained.                       After obtaining informed consent, the colonoscope was                        passed under direct vision. Throughout the procedure,                        the patient's blood pressure, pulse, and oxygen                        saturations were monitored continuously. The                        Colonoscope was introduced through the anus and                        advanced to the the cecum, identified by appendiceal                        orifice and ileocecal valve. The colonoscopy was  performed without difficulty. The patient tolerated the                        procedure well. The quality of the bowel preparation                        was excellent. Findings:      A 5 mm polyp was found  in the rectum. The polyp was sessile. Biopsies       were taken with a cold forceps for histology.      The retroflexed view of the distal rectum and anal verge was normal and       showed no anal or rectal abnormalities. Impression:           - One 5 mm polyp in the rectum. Biopsied.                       - The distal rectum and anal verge are normal on                        retroflexion view. Recommendation:       - Telephone endoscopist for pathology results in 1 week. Procedure Code(s):    --- Professional ---                       [id number], Colonoscopy, flexible; with biopsy, single or                        multiple Diagnosis Code(s):    --- Professional ---                       K62.1, Rectal polyp                       Z12.11, Encounter for screening for malignant neoplasm                        of colon CPT copyright 2016 American Medical Association. All rights reserved. The codes documented in this report are preliminary and upon coder review may  be revised to meet current compliance requirements. Earline MayotteJeffrey W. Gabreal Worton, MD 08/13/2016 8:51:08 AM This report has been signed electronically. Number of Addenda: 0 Note Initiated On: 08/13/2016 8:14 AM Scope Withdrawal Time: 0 hours 14 minutes 48 seconds  Total Procedure Duration: 0 hours 21 minutes 40 seconds       [facility]

## 2016-08-13 NOTE — H&P (Signed)
GRACE HAGGART [id number] [birth year]     HPI: Healthy 61 y/o male for screening colonoscopy. Tolerated prep well.   Prescriptions Prior to Admission  Medication Sig Dispense Refill Last Dose  . calcium citrate (CALCITRATE - DOSED IN MG ELEMENTAL CALCIUM) 950 MG tablet Take 200 mg of elemental calcium by mouth daily.   08/12/2016 at Unknown time  . cetirizine (ZYRTEC) 5 MG tablet Take 5 mg by mouth daily.   08/12/2016 at Unknown time  . Cholecalciferol (D 2000) 2000 UNITS TABS Take by mouth daily.    08/12/2016 at Unknown time  . CIALIS 5 MG tablet TAKE 1 TABLET BY MOUTH EVERY DAY AS NEEDED ERECTILE DYSFUNCTION 30 tablet 3 Past Week at Unknown time  . DEXILANT 60 MG capsule TAKE 1 CAPSULE EVERY DAY 30 capsule 11 08/13/2016 at 0645  . dextromethorphan-guaiFENesin (MUCINEX DM) 30-600 MG 12hr tablet Take 1 tablet by mouth 2 (two) times daily as needed for cough.   08/12/2016 at Unknown time  . dutasteride (AVODART) 0.5 MG capsule TAKE ONE CAPSULE BY MOUTH EVERY DAY 30 capsule 11 08/12/2016 at Unknown time  . glucosamine-chondroitin 500-400 MG tablet Take 1 tablet by mouth 2 (two) times daily.   08/12/2016 at Unknown time  . hydrochlorothiazide (HYDRODIURIL) 25 MG tablet TAKE 1 TABLET (25 MG TOTAL) BY MOUTH DAILY. 90 tablet 3 08/13/2016 at 0645  . ketotifen (ALAWAY) 0.025 % ophthalmic solution Place 1 drop into both eyes daily. 1 drop 2 (two) times daily.   08/12/2016 at Unknown time  . Multiple Vitamin (MULTI-VITAMINS) TABS Take by mouth daily.    08/12/2016 at Unknown time  . naproxen (NAPROSYN) 500 MG tablet TAKE 1 TABLET BY MOUTH TWICE A DAY AS NEEDED FOR PAIN 60 tablet 12 08/12/2016 at Unknown time  . polyethylene glycol powder (GLYCOLAX/MIRALAX) powder 255 grams one bottle for colonoscopy prep 255 g 0 08/12/2016 at Unknown time  . ranitidine (ZANTAC) 300 MG capsule Take 1 capsule (300 mg total) by mouth every evening. 90 capsule 3 08/12/2016 at Unknown time  . scopolamine (TRANSDERM-SCOP, 1.5 MG,) 1 MG/3DAYS  Place 1 patch (1.5 mg total) onto the skin every 3 (three) days. 10 patch 12 08/12/2016 at Unknown time  . tamsulosin (FLOMAX) 0.4 MG CAPS capsule TAKE 1 CAPSULE BY MOUTH DAILY 30 capsule 11 08/12/2016 at Unknown time  . Testosterone (AXIRON) 30 MG/ACT SOLN One swipe per axilla daily 60 mL 5 08/12/2016 at Unknown time  . Turmeric 500 MG CAPS Take 1 capsule by mouth daily.   08/12/2016 at Unknown time  . valsartan (DIOVAN) 320 MG tablet TAKE 1 TABLET EVERY DAY 90 tablet 3 08/13/2016 at 0645  . verapamil (CALAN-SR) 240 MG CR tablet Take 1 tablet (240 mg total) by mouth 2 (two) times daily. 180 tablet 3 08/13/2016 at 0645  . Azelastine-Fluticasone (DYMISTA) 137-50 MCG/ACT SUSP 2 (two) times daily. by Nasal route.   Taking  . budesonide-formoterol (SYMBICORT) 160-4.5 MCG/ACT inhaler Inhale 2 puffs into the lungs 2 (two) times daily.   Taking  . EPIPEN 2-PAK 0.3 MG/0.3ML SOAJ injection USE AS DIRECTED AS NEEDED  1 Not Taking at Unknown time   Allergies  Allergen Reactions  . Hydralazine Hives    Chest tightness, severe headache, dyspnea  . Moxifloxacin Hives    Avelox  . Pseudoephedrine Other (See Comments)    Confusion, passed out, numbness  . Pseudoephedrine Hcl     Other reaction(s): Syncope  . Sulfa Antibiotics Hives  . Sulfonamide Derivatives Hives   Past  Medical History:  Diagnosis Date  . Allergy   . Asthma   . BP (high blood pressure) 11/07/2014   Should stop combination of Tekturna (DRI)  and ARB   . Hypertension   . HYPERTENSION, SEVERE 03/15/2007   Qualifier: Diagnosis of  By: Vernie Murdersaskin, Leslie    . Prostate enlargement    Past Surgical History:  Procedure Laterality Date  . APPENDECTOMY  1990   Dr Lemar LivingsByrnett  . COLONOSCOPY  2008   Dr Lemar LivingsByrnett  . HEMORRHOID SURGERY    . HERNIA REPAIR  01/20/14   umbilical hernia  . KNEE ARTHROSCOPY Left   . KNEE SURGERY Left 1986  . TONSILLECTOMY    . VASECTOMY     Social History   Social History  . Marital status: Married    Spouse name: N/A  .  Number of children: N/A  . Years of education: N/A   Occupational History  . Not on file.   Social History Main Topics  . Smoking status: Never Smoker  . Smokeless tobacco: Never Used     Comment: Exposed to second hand smoke as a child  . Alcohol use 0.0 oz/week     Comment: occasionally  . Drug use: No  . Sexual activity: Not on file   Other Topics Concern  . Not on file   Social History Narrative  . No narrative on file   Social History   Social History Narrative  . No narrative on file     ROS: Negative.     PE: HEENT: Negative. Lungs: Clear. Cardio: RR.  Assessment/Plan:  Proceed with planned endoscopy.   [name], Eugean Arnott W 08/13/2016   Assessment/Plan:  Proceed with planned endoscopy.

## 2016-08-13 NOTE — Anesthesia Preprocedure Evaluation (Signed)
Anesthesia Evaluation  Patient identified by MRN, date of birth, ID band Patient awake    Reviewed: Allergy & Precautions, H&P , NPO status , Patient's Chart, lab work & pertinent test results, reviewed documented beta blocker date and time   Airway Mallampati: III   Neck ROM: full    Dental   Pulmonary neg pulmonary ROS, asthma , sleep apnea and Continuous Positive Airway Pressure Ventilation ,    Pulmonary exam normal        Cardiovascular hypertension, negative cardio ROS Normal cardiovascular exam Rhythm:regular Rate:Normal     Neuro/Psych negative neurological ROS  negative psych ROS   GI/Hepatic negative GI ROS, Neg liver ROS, GERD  Medicated,  Endo/Other  negative endocrine ROS  Renal/GU negative Renal ROS  negative genitourinary   Musculoskeletal   Abdominal   Peds  Hematology negative hematology ROS (+)   Anesthesia Other Findings Past Medical History: No date: Allergy No date: Asthma 11/07/2014: BP (high blood pressure)     Comment: Should stop combination of Tekturna (DRI)  and              ARB  No date: Hypertension 03/15/2007: HYPERTENSION, SEVERE     Comment: Qualifier: Diagnosis of  By: Vernie Murdersaskin, Leslie   No date: Prostate enlargement Past Surgical History: 1990: APPENDECTOMY     Comment: Dr Lemar LivingsByrnett 2008: COLONOSCOPY     Comment: Dr Lemar LivingsByrnett No date: HEMORRHOID SURGERY 01/20/14: HERNIA REPAIR     Comment: umbilical hernia No date: KNEE ARTHROSCOPY Left 1986: KNEE SURGERY Left No date: TONSILLECTOMY No date: VASECTOMY BMI    Body Mass Index:  27.12 kg/m     Reproductive/Obstetrics negative OB ROS                             Anesthesia Physical Anesthesia Plan  ASA: III  Anesthesia Plan: General   Post-op Pain Management:    Induction:   PONV Risk Score and Plan:   Airway Management Planned:   Additional Equipment:   Intra-op Plan:   Post-operative  Plan:   Informed Consent: I have reviewed the patients History and Physical, chart, labs and discussed the procedure including the risks, benefits and alternatives for the proposed anesthesia with the patient or authorized representative who has indicated his/her understanding and acceptance.   Dental Advisory Given  Plan Discussed with: CRNA  Anesthesia Plan Comments:         Anesthesia Quick Evaluation

## 2016-08-13 NOTE — Anesthesia Post-op Follow-up Note (Cosign Needed)
Anesthesia QCDR form completed.        

## 2016-08-13 NOTE — Transfer of Care (Signed)
Immediate Anesthesia Transfer of Care Note  Patient: [name] [name]  Procedure(s) Performed: Procedure(s): COLONOSCOPY WITH PROPOFOL (N/A)  Patient Location: PACU and Endoscopy Unit  Anesthesia Type:General  Level of Consciousness: awake, alert  and oriented  Airway & Oxygen Therapy: Patient Spontanous Breathing and Patient connected to nasal cannula oxygen  Post-op Assessment: Report given to RN and Post -op Vital signs reviewed and stable  Post vital signs: Reviewed and stable  Last Vitals:  Vitals:   08/13/16 0752 08/13/16 0854  BP: 139/75 (!) 90/55  Pulse: 71 67  Resp: 16 16  Temp: 36.3 C (!) 36 C    Last Pain:  Vitals:   08/13/16 0854  TempSrc: Tympanic         Complications: No apparent anesthesia complications

## 2016-08-14 ENCOUNTER — Encounter: Payer: Self-pay | Admitting: General Surgery

## 2016-08-14 LAB — SURGICAL PATHOLOGY

## 2016-08-14 NOTE — Anesthesia Postprocedure Evaluation (Signed)
Anesthesia Post Note  Patient: Precious HawsChristopher D Pinson  Procedure(s) Performed: Procedure(s) (LRB): COLONOSCOPY WITH PROPOFOL (N/A)  Patient location during evaluation: PACU Anesthesia Type: General Level of consciousness: awake and alert Pain management: pain level controlled Vital Signs Assessment: post-procedure vital signs reviewed and stable Respiratory status: spontaneous breathing, nonlabored ventilation, respiratory function stable and patient connected to nasal cannula oxygen Cardiovascular status: blood pressure returned to baseline and stable Postop Assessment: no signs of nausea or vomiting Anesthetic complications: no     Last Vitals:  Vitals:   08/13/16 0914 08/13/16 0924  BP: 119/73 123/71  Pulse: 69 68  Resp: 18 17  Temp:      Last Pain:  Vitals:   08/13/16 0924  TempSrc:   PainSc: 0-No pain                 Yevette EdwardsJames G Kimmy Parish

## 2016-08-22 ENCOUNTER — Other Ambulatory Visit: Payer: Self-pay | Admitting: Family Medicine

## 2016-09-01 ENCOUNTER — Encounter: Payer: Self-pay | Admitting: Family Medicine

## 2016-10-09 ENCOUNTER — Encounter: Payer: Self-pay | Admitting: Family Medicine

## 2016-10-19 ENCOUNTER — Other Ambulatory Visit: Payer: Self-pay | Admitting: Family Medicine

## 2016-10-27 ENCOUNTER — Telehealth: Payer: Self-pay | Admitting: Family Medicine

## 2016-10-27 DIAGNOSIS — I10 Essential (primary) hypertension: Secondary | ICD-10-CM

## 2016-10-27 NOTE — Telephone Encounter (Signed)
Pt states he rec'd [facility] that the Rx valsartan (DIOVAN) 320 MG tablet has ben recalled.  Pt is asking if a new Rx will resent.  [facility] [address]  CB#[phone]/MW

## 2016-10-28 MED ORDER — LOSARTAN POTASSIUM 100 MG PO TABS: 100.0000 mg | ORAL_TABLET | Freq: Every day | ORAL | 3 refills | Status: DC

## 2016-10-28 NOTE — Telephone Encounter (Signed)
Patient advised. Prescription sent into pharmacy.  

## 2016-10-28 NOTE — Telephone Encounter (Signed)
Change to Losartan 100mg daily

## 2016-11-13 ENCOUNTER — Other Ambulatory Visit: Payer: Self-pay

## 2016-11-13 DIAGNOSIS — N401 Enlarged prostate with lower urinary tract symptoms: Secondary | ICD-10-CM

## 2016-11-13 MED ORDER — TADALAFIL 5 MG PO TABS: ORAL_TABLET | ORAL | 3 refills | Status: DC

## 2016-12-15 ENCOUNTER — Other Ambulatory Visit: Payer: Self-pay

## 2016-12-15 DIAGNOSIS — E291 Testicular hypofunction: Secondary | ICD-10-CM

## 2016-12-15 MED ORDER — TESTOSTERONE 30 MG/ACT TD SOLN: TRANSDERMAL | 5 refills | Status: DC

## 2016-12-17 ENCOUNTER — Encounter: Payer: Self-pay | Admitting: Family Medicine

## 2016-12-17 ENCOUNTER — Ambulatory Visit (INDEPENDENT_AMBULATORY_CARE_PROVIDER_SITE_OTHER): Payer: BLUE CROSS/BLUE SHIELD | Admitting: Family Medicine

## 2016-12-17 VITALS — BP 142/82 | HR 78 | Temp 98.0°F | Resp 16 | Ht 72.0 in | Wt 210.0 lb

## 2016-12-17 DIAGNOSIS — Z0000 Encounter for general adult medical examination without abnormal findings: Secondary | ICD-10-CM | POA: Diagnosis not present

## 2016-12-17 DIAGNOSIS — Z1211 Encounter for screening for malignant neoplasm of colon: Secondary | ICD-10-CM

## 2016-12-17 NOTE — Progress Notes (Signed)
Patient: [name] [name], Male    DOB: [birth year], 61 y.o.   MRN: [id number] Visit Date: 12/17/2016  Today's Provider: Megan Mans, MD   Chief Complaint  Patient presents with  . Annual Exam   Subjective:    Annual physical exam [name] [name] is a 61 y.o. male who presents today for health maintenance and complete physical. He feels well. He reports exercising but not as much as he should due to traveling alot. He reports he is sleeping well. He is married and has one daughter and one son. Son lives in [place] and the daughter lives in [place]. Both are out of school and working. Patient is planning to retire in 2023.  ----------------------------------------------------------------- Colonoscopy- 08/13/16 Dr. Lemar Livings, hyperplastic polyps repeat 10 years   Review of Systems  Constitutional: Negative.   HENT: Positive for congestion.   Eyes: Negative.   Respiratory: Positive for apnea.   Cardiovascular: Positive for leg swelling (on planes, bilaterally).  Gastrointestinal: Negative.   Endocrine: Negative.   Genitourinary: Negative.   Musculoskeletal: Negative.   Skin: Negative.   Allergic/Immunologic: Positive for environmental allergies.  Neurological: Negative.   Hematological: Bruises/bleeds easily.  Psychiatric/Behavioral: Negative.     Social History      He  reports that he has never smoked. He has never used smokeless tobacco. He reports that he drinks alcohol. He reports that he does not use drugs.       Social History   Social History  . Marital status: Married    Spouse name: N/A  . Number of children: N/A  . Years of education: N/A   Social History Main Topics  . Smoking status: Never Smoker  . Smokeless tobacco: Never Used     Comment: Exposed to second hand smoke as a child  . Alcohol use 0.0 oz/week     Comment: occasionally  . Drug use: No  . Sexual activity: Not Asked   Other Topics Concern  . None    Social History Narrative  . None    Past Medical History:  Diagnosis Date  . Allergy   . Asthma   . BP (high blood pressure) 11/07/2014   Should stop combination of Tekturna (DRI)  and ARB   . Hypertension   . HYPERTENSION, SEVERE 03/15/2007   Qualifier: Diagnosis of  By: Vernie Murders    . Prostate enlargement      Patient Active Problem List   Diagnosis Date Noted  . Thrombosed external hemorrhoid 05/10/2015  . Allergic rhinitis 11/07/2014  . Arthralgia of hand 11/07/2014  . Airway hyperreactivity 11/07/2014  . Benign fibroma of prostate 11/07/2014  . Impotence of organic origin 11/07/2014  . Acid reflux 11/07/2014  . BP (high blood pressure) 11/07/2014  . 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 Obstructive apnea 11/07/2014  . Plantar fasciitis 11/07/2014  . 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 Testicular hypofunction 10/26/2013  . Cellulitis 10/26/2013  . Essential (primary) hypertension 10/26/2013  . Other specified counseling 10/26/2013  . Febrile 10/26/2013  . Pain in soft tissues of limb 10/26/2013  . Encounter for screening for other viral diseases 09/29/2012  . ASTHMATIC BRONCHITIS, ACUTE 03/25/2007  . HYPERTENSION, SEVERE 03/15/2007  . RHINITIS 03/15/2007  . Asthma 03/15/2007  . ACID REFLUX DISEASE 03/15/2007  . COUGH, CHRONIC 03/15/2007    Past Surgical History:  Procedure Laterality Date  . APPENDECTOMY  1990   Dr Lemar Livings  . COLONOSCOPY  2008   Dr Lemar Livings  .  COLONOSCOPY WITH PROPOFOL N/A 08/13/2016   Procedure: COLONOSCOPY WITH PROPOFOL;  Surgeon: Earline Mayotte, MD;  Location: [facility] ENDOSCOPY;  Service: Endoscopy;  Laterality: N/A;  . HEMORRHOID SURGERY    . HERNIA REPAIR  01/20/14   umbilical hernia  . KNEE ARTHROSCOPY Left   . KNEE SURGERY Left 1986  . TONSILLECTOMY    . VASECTOMY      Family History        Family Status  Relation Status  . Mother Deceased at age 42  . Father Deceased at age 64  . Sister Alive  .  Sister Alive  . Oneal Grout Deceased  . Sister Alive  . Sister Alive  . Oneal Grout (Not Specified)  . Mat Uncle (Not Specified)  . PGM (Not Specified)  . Emelda Brothers (Not Specified)        His family history includes Alcohol abuse in his paternal uncle; Asthma in his sister; Cancer in his father, maternal uncle, mother, paternal aunt, and paternal grandmother; Cancer - Prostate in his paternal uncle; Glaucoma in his mother; Hypertension in his sister; Liver disease in his paternal uncle.     Allergies  Allergen Reactions  . Hydralazine Hives    Chest tightness, severe headache, dyspnea  . Moxifloxacin Hives    Avelox  . Pseudoephedrine Other (See Comments)    Confusion, passed out, numbness  . Pseudoephedrine Hcl     Other reaction(s): Syncope  . Sulfa Antibiotics Hives  . Sulfonamide Derivatives Hives     Current Outpatient Prescriptions:  .  budesonide-formoterol (SYMBICORT) 160-4.5 MCG/ACT inhaler, Inhale 2 puffs into the lungs 2 (two) times daily., Disp: , Rfl:  .  calcium citrate (CALCITRATE - DOSED IN MG ELEMENTAL CALCIUM) 950 MG tablet, Take 200 mg of elemental calcium by mouth daily., Disp: , Rfl:  .  cetirizine (ZYRTEC) 5 MG tablet, Take 5 mg by mouth daily., Disp: , Rfl:  .  Cholecalciferol (D 2000) 2000 UNITS TABS, Take by mouth daily. , Disp: , Rfl:  .  DEXILANT 60 MG capsule, TAKE 1 CAPSULE EVERY DAY, Disp: 30 capsule, Rfl: 11 .  dutasteride (AVODART) 0.5 MG capsule, TAKE ONE CAPSULE BY MOUTH EVERY DAY, Disp: 30 capsule, Rfl: 11 .  EPIPEN 2-PAK 0.3 MG/0.3ML SOAJ injection, USE AS DIRECTED AS NEEDED, Disp: , Rfl: 1 .  glucosamine-chondroitin 500-400 MG tablet, Take 1 tablet by mouth 2 (two) times daily., Disp: , Rfl:  .  hydrochlorothiazide (HYDRODIURIL) 25 MG tablet, TAKE 1 TABLET (25 MG TOTAL) BY MOUTH DAILY., Disp: 90 tablet, Rfl: 3 .  ketotifen (ALAWAY) 0.025 % ophthalmic solution, Place 1 drop into both eyes daily. 1 drop 2 (two) times daily., Disp: , Rfl:  .  losartan  (COZAAR) 100 MG tablet, Take 1 tablet (100 mg total) by mouth daily., Disp: 90 tablet, Rfl: 3 .  Multiple Vitamin (MULTI-VITAMINS) TABS, Take by mouth daily. , Disp: , Rfl:  .  naproxen (NAPROSYN) 500 MG tablet, TAKE 1 TABLET BY MOUTH TWICE A DAY AS NEEDED FOR PAIN, Disp: 60 tablet, Rfl: 12 .  ranitidine (ZANTAC) 300 MG capsule, Take 1 capsule (300 mg total) by mouth every evening., Disp: 90 capsule, Rfl: 3 .  tadalafil (CIALIS) 5 MG tablet, TAKE 1 TABLET BY MOUTH EVERY DAY AS NEEDED ERECTILE DYSFUNCTION, Disp: 30 tablet, Rfl: 3 .  tamsulosin (FLOMAX) 0.4 MG CAPS capsule, TAKE 1 CAPSULE BY MOUTH DAILY, Disp: 30 capsule, Rfl: 11 .  Testosterone (AXIRON) 30 MG/ACT SOLN, One swipe per axilla  daily, Disp: 60 mL, Rfl: 5 .  Turmeric 500 MG CAPS, Take 1 capsule by mouth daily., Disp: , Rfl:  .  verapamil (CALAN-SR) 240 MG CR tablet, Take 1 tablet (240 mg total) by mouth 2 (two) times daily., Disp: 180 tablet, Rfl: 3 .  Azelastine-Fluticasone (DYMISTA) 137-50 MCG/ACT SUSP, 2 (two) times daily. by Nasal route., Disp: , Rfl:  .  dextromethorphan-guaiFENesin (MUCINEX DM) 30-600 MG 12hr tablet, Take 1 tablet by mouth 2 (two) times daily as needed for cough., Disp: , Rfl:  .  scopolamine (TRANSDERM-SCOP, 1.5 MG,) 1 MG/3DAYS, Place 1 patch (1.5 mg total) onto the skin every 3 (three) days. (Patient not taking: Reported on 12/17/2016), Disp: 10 patch, Rfl: 12   Patient Care Team: Maple Hudson., MD as PCP - General (Family Medicine) Maple Hudson., MD (Family Medicine) Lemar Livings, Merrily Pew, MD (General Surgery)      Objective:   Vitals: BP (!) 142/82 (BP Location: Left Arm, Patient Position: Sitting, Cuff Size: Normal)   Pulse 78   Temp 98 F (36.7 C) (Oral)   Resp 16   Ht 6' (1.829 m)   Wt 210 lb (95.3 kg)   BMI 28.48 kg/m    Vitals:   12/17/16 0933  BP: (!) 142/82  Pulse: 78  Resp: 16  Temp: 98 F (36.7 C)  TempSrc: Oral  Weight: 210 lb (95.3 kg)  Height: 6' (1.829 m)      Physical Exam  Constitutional: He is oriented to person, place, and time. He appears well-developed and well-nourished.  HENT:  Head: Normocephalic and atraumatic.  Right Ear: External ear normal.  Left Ear: External ear normal.  Nose: Nose normal.  Mouth/Throat: Oropharynx is clear and moist.  Eyes: Pupils are equal, round, and reactive to light. Conjunctivae and EOM are normal. No scleral icterus.  Neck: Normal range of motion. Neck supple. No thyromegaly present.  Cardiovascular: Normal rate, regular rhythm, normal heart sounds and intact distal pulses.   Pulmonary/Chest: Effort normal and breath sounds normal.  Abdominal: Soft. Bowel sounds are normal.  Genitourinary: Rectum normal, prostate normal and penis normal.  Musculoskeletal: Normal range of motion. He exhibits edema.  Trace LE edema.  Neurological: He is alert and oriented to person, place, and time. He has normal reflexes.  Skin: Skin is warm and dry.  Telangectasia of upper abdomen.  Psychiatric: He has a normal mood and affect. His behavior is normal. Judgment and thought content normal.     Depression Screen PHQ 2/9 Scores 06/18/2016 11/07/2014  PHQ - 2 Score 0 0  PHQ- 9 Score 0 -      Assessment & Plan:     Routine Health Maintenance and Physical Exam  Exercise Activities and Dietary recommendations Goals    None      Immunization History  Administered Date(s) Administered  . Influenza,inj,Quad PF,6+ Mos 11/28/2014  . Influenza-Unspecified 12/01/2016  . Pneumococcal Polysaccharide-23 09/04/1998  . Td 11/15/2003  . Tdap 10/11/2013    Health Maintenance  Topic Date Due  . HIV Screening  11/12/1970  . TETANUS/TDAP  10/12/2023  . COLONOSCOPY  08/14/2026  . INFLUENZA VACCINE  Completed  . Hepatitis C Screening  Completed     Discussed health benefits of physical activity, and encouraged him to engage in regular exercise appropriate for his age and condition.     --------------------------------------------------------------------    Megan Mans, MD  [facility]

## 2016-12-18 ENCOUNTER — Telehealth: Payer: Self-pay | Admitting: Urology

## 2016-12-18 NOTE — Telephone Encounter (Signed)
Spoke with pt and made aware testosterone script has been faxed again. Pt voiced understanding.

## 2016-12-18 NOTE — Telephone Encounter (Signed)
Pt called office stating that his MyChart shows his Rx for testosterone was sent to his pharmacy, however the pharmacy states they did not get it.  [facility] [address] [facility] Please advise. Pt asks for call to confirm this has been completed. Thanks.

## 2016-12-26 ENCOUNTER — Other Ambulatory Visit: Payer: BLUE CROSS/BLUE SHIELD

## 2016-12-26 DIAGNOSIS — Z0000 Encounter for general adult medical examination without abnormal findings: Secondary | ICD-10-CM

## 2016-12-27 LAB — CBC WITH DIFFERENTIAL/PLATELET
Basophils Absolute: 0 10*3/uL (ref 0.0–0.2)
Basos: 0 %
EOS (ABSOLUTE): 0.1 10*3/uL (ref 0.0–0.4)
EOS: 1 %
HEMATOCRIT: 42 % (ref 37.5–51.0)
HEMOGLOBIN: 13.9 g/dL (ref 13.0–17.7)
Immature Grans (Abs): 0 10*3/uL (ref 0.0–0.1)
Immature Granulocytes: 0 %
LYMPHS ABS: 1.7 10*3/uL (ref 0.7–3.1)
Lymphs: 30 %
MCH: 31.6 pg (ref 26.6–33.0)
MCHC: 33.1 g/dL (ref 31.5–35.7)
MCV: 96 fL (ref 79–97)
MONOCYTES: 6 %
MONOS ABS: 0.4 10*3/uL (ref 0.1–0.9)
Neutrophils Absolute: 3.5 10*3/uL (ref 1.4–7.0)
Neutrophils: 63 %
Platelets: 197 10*3/uL (ref 150–379)
RBC: 4.4 x10E6/uL (ref 4.14–5.80)
RDW: 13.3 % (ref 12.3–15.4)
WBC: 5.6 10*3/uL (ref 3.4–10.8)

## 2016-12-27 LAB — COMPREHENSIVE METABOLIC PANEL
ALBUMIN: 4.2 g/dL (ref 3.6–4.8)
ALT: 16 IU/L (ref 0–44)
AST: 20 IU/L (ref 0–40)
Albumin/Globulin Ratio: 2.2 (ref 1.2–2.2)
Alkaline Phosphatase: 62 IU/L (ref 39–117)
BUN / CREAT RATIO: 14 (ref 10–24)
BUN: 14 mg/dL (ref 8–27)
Bilirubin Total: 0.6 mg/dL (ref 0.0–1.2)
CO2: 27 mmol/L (ref 20–29)
CREATININE: 0.99 mg/dL (ref 0.76–1.27)
Calcium: 9.1 mg/dL (ref 8.6–10.2)
Chloride: 100 mmol/L (ref 96–106)
GFR, EST AFRICAN AMERICAN: 95 mL/min/{1.73_m2} (ref >59)
GFR, EST NON AFRICAN AMERICAN: 82 mL/min/{1.73_m2} (ref >59)
GLOBULIN, TOTAL: 1.9 g/dL (ref 1.5–4.5)
Glucose: 96 mg/dL (ref 65–99)
Potassium: 3.7 mmol/L (ref 3.5–5.2)
SODIUM: 141 mmol/L (ref 134–144)
TOTAL PROTEIN: 6.1 g/dL (ref 6.0–8.5)

## 2016-12-27 LAB — LIPID PANEL
CHOLESTEROL TOTAL: 168 mg/dL (ref 100–199)
Chol/HDL Ratio: 3.6 ratio (ref 0.0–5.0)
HDL: 47 mg/dL (ref >39)
LDL CALC: 101 mg/dL — AB (ref 0–99)
Triglycerides: 100 mg/dL (ref 0–149)
VLDL CHOLESTEROL CAL: 20 mg/dL (ref 5–40)

## 2016-12-27 LAB — TSH: TSH: 1.72 u[IU]/mL (ref 0.450–4.500)

## 2016-12-29 ENCOUNTER — Telehealth: Payer: Self-pay

## 2016-12-29 NOTE — Telephone Encounter (Signed)
-----   Message from Maple Hudsonichard L Gilbert Jr., MD sent at 12/29/2016  8:31 AM EDT ----- Labs ok,.

## 2016-12-29 NOTE — Telephone Encounter (Signed)
Pt advised.   Thanks,   -Glenice Ciccone  

## 2017-03-12 ENCOUNTER — Other Ambulatory Visit: Payer: Self-pay | Admitting: Family Medicine

## 2017-03-16 ENCOUNTER — Ambulatory Visit: Payer: BLUE CROSS/BLUE SHIELD | Admitting: Internal Medicine

## 2017-03-16 ENCOUNTER — Encounter: Payer: Self-pay | Admitting: Internal Medicine

## 2017-03-16 VITALS — BP 146/82 | HR 85 | Resp 16 | Ht 72.0 in | Wt 212.4 lb

## 2017-03-16 DIAGNOSIS — K219 Gastro-esophageal reflux disease without esophagitis: Secondary | ICD-10-CM

## 2017-03-16 DIAGNOSIS — G4733 Obstructive sleep apnea (adult) (pediatric): Secondary | ICD-10-CM

## 2017-03-16 DIAGNOSIS — Z9989 Dependence on other enabling machines and devices: Secondary | ICD-10-CM | POA: Diagnosis not present

## 2017-03-16 DIAGNOSIS — J4520 Mild intermittent asthma, uncomplicated: Secondary | ICD-10-CM

## 2017-03-16 NOTE — Progress Notes (Signed)
[facility] [address] [place], [place] [address]  Pulmonary Sleep Medicine  Office Visit Note  Patient Name: [name] [name] DOB: 1955-10-21 MRN [id number]  Date of Service: 03/16/2017     Complaints/HPI: He is doing well. Overall his compliance is 1005. He has had no issues with the CPAP device. I reviewed his download and this looks good. He has had some issues with reflux and he is going to see his PCP for GI referral. He has had some issues with cough from allergies from Dr Basin Callas he gets allergy shots  Current Medication: Outpatient Encounter Medications as of 03/16/2017  Medication Sig Note  . albuterol (PROAIR HFA) 108 (90 Base) MCG/ACT inhaler INHALE 1-2 PUFFS AS NEEDED EVERY 4 TO 6 HOURS AS NEEDED FOR COUGH/WHEEZE. MUST LAST 90 DAYS PER MD   . Azelastine-Fluticasone (DYMISTA) 137-50 MCG/ACT SUSP 2 (two) times daily. by Nasal route. 11/02/2013: Received from: [facility]  . budesonide-formoterol (SYMBICORT) 160-4.5 MCG/ACT inhaler Inhale 2 puffs into the lungs 2 (two) times daily.   . calcium citrate (CALCITRATE - DOSED IN MG ELEMENTAL CALCIUM) 950 MG tablet Take 200 mg of elemental calcium by mouth daily.   . Cholecalciferol (D 2000) 2000 UNITS TABS Take by mouth daily.  11/02/2013: Received from: [facility]  . DEXILANT 60 MG capsule TAKE 1 CAPSULE EVERY DAY   . dextromethorphan-guaiFENesin (MUCINEX DM) 30-600 MG 12hr tablet Take 1 tablet by mouth 2 (two) times daily as needed for cough.   . dutasteride (AVODART) 0.5 MG capsule TAKE ONE CAPSULE BY MOUTH EVERY DAY   . EPIPEN 2-PAK 0.3 MG/0.3ML SOAJ injection USE AS DIRECTED AS NEEDED   . glucosamine-chondroitin 500-400 MG tablet Take 1 tablet by mouth 2 (two) times daily.   . hydrochlorothiazide (HYDRODIURIL) 25 MG tablet TAKE 1 TABLET (25 MG TOTAL) BY MOUTH DAILY.   Marland Kitchen ketotifen (ALAWAY) 0.025 % ophthalmic solution Place 1 drop into both eyes daily. 1 drop 2 (two) times  daily. 11/02/2013: Received from: [facility]  . losartan (COZAAR) 100 MG tablet Take 1 tablet (100 mg total) by mouth daily.   . Multiple Vitamin (MULTI-VITAMINS) TABS Take by mouth daily.  11/02/2013: Received from: [facility]  . naproxen (NAPROSYN) 500 MG tablet TAKE 1 TABLET BY MOUTH TWICE A DAY AS NEEDED FOR PAIN   . ranitidine (ZANTAC) 300 MG capsule Take 1 capsule (300 mg total) by mouth every evening.   Marland Kitchen scopolamine (TRANSDERM-SCOP, 1.5 MG,) 1 MG/3DAYS Place 1 patch (1.5 mg total) onto the skin every 3 (three) days.   . tadalafil (CIALIS) 5 MG tablet TAKE 1 TABLET BY MOUTH EVERY DAY AS NEEDED ERECTILE DYSFUNCTION   . tamsulosin (FLOMAX) 0.4 MG CAPS capsule TAKE 1 CAPSULE BY MOUTH DAILY   . Testosterone (AXIRON) 30 MG/ACT SOLN One swipe per axilla daily   . Turmeric 500 MG CAPS Take 1 capsule by mouth daily.   . verapamil (CALAN-SR) 240 MG CR tablet TAKE 1 TABLET TWICE A DAY   . cetirizine (ZYRTEC) 5 MG tablet Take 5 mg by mouth daily.    No facility-administered encounter medications on file as of 03/16/2017.     Surgical History: Past Surgical History:  Procedure Laterality Date  . APPENDECTOMY  1990   Dr Lemar Livings  . COLONOSCOPY  2008   Dr Lemar Livings  . COLONOSCOPY WITH PROPOFOL N/A 08/13/2016   Procedure: COLONOSCOPY WITH PROPOFOL;  Surgeon: Earline Mayotte, MD;  Location: [facility] ENDOSCOPY;  Service: Endoscopy;  Laterality: N/A;  . HEMORRHOID SURGERY    . HERNIA REPAIR  01/20/14   umbilical hernia  . KNEE ARTHROSCOPY Left   . KNEE SURGERY Left 1986  . TONSILLECTOMY    . VASECTOMY      Medical History: Past Medical History:  Diagnosis Date  . Allergy   . Asthma   . BP (high blood pressure) 11/07/2014   Should stop combination of Tekturna (DRI)  and ARB   . Hypertension   . HYPERTENSION, SEVERE 03/15/2007   Qualifier: Diagnosis of  By: Vernie Murders    . Prostate enlargement     Family History: Family History  Problem Relation Age of  Onset  . Cancer Mother        ovarian  . Glaucoma Mother   . Cancer Father        prostate  . Hypertension Sister   . Asthma Sister   . Alcohol abuse Paternal Uncle   . Liver disease Paternal Uncle   . Cancer - Prostate Paternal Uncle   . Cancer Maternal Uncle        lung; two mat uncles  . Cancer Paternal Grandmother        breast  . Cancer Paternal Aunt        lung cancer in a smoker    Social History: Social History   Socioeconomic History  . Marital status: Married    Spouse name: Not on file  . Number of children: Not on file  . Years of education: Not on file  . Highest education level: Not on file  Social Needs  . Financial resource strain: Not on file  . Food insecurity - worry: Not on file  . Food insecurity - inability: Not on file  . Transportation needs - medical: Not on file  . Transportation needs - non-medical: Not on file  Occupational History  . Not on file  Tobacco Use  . Smoking status: Never Smoker  . Smokeless tobacco: Never Used  . Tobacco comment: Exposed to second hand smoke as a child  Substance and Sexual Activity  . Alcohol use: Yes    Alcohol/week: 0.0 oz    Comment: occasionally  . Drug use: No  . Sexual activity: Not on file  Other Topics Concern  . Not on file  Social History Narrative  . Not on file     ROS  General: (-) fever, (-) chills, (-) night sweats, (-) weakness, (-) changes in appetite. Skin: (-) rashes, (-) itching,. Eyes: (-) visual changes, (-) redness, (-) itching, (-) double or blurred vision. Nose and Sinuses: (-) nasal stuffiness or itchiness, (-) postnasal drip, (-) nosebleeds, (-) sinus trouble. Mouth and Throat: (-) sore throat, (-) hoarseness. Neck: (-) swollen glands, (-) enlarged thyroid, (-) neck pain. Respiratory: (-) cough, (-) bloody sputum, (-) shortness of breath, (-) wheezing. Cardiovascular: (-) ankle swelling, (-) chest pain. Lymphatic: (-) lymph node enlargement, (-) lymph node  tenderness. Neurologic: (-) numbness, (-) tingling,(-) dizziness. Psychiatric: (-) anxiety, (-) depression.  Vital Signs: Blood pressure (!) 146/82, pulse 85, resp. rate 16, height 6' (1.829 m), weight 212 lb 6.4 oz (96.3 kg), SpO2 97 %.  Examination: General Appearance: The patient is well-developed, well-nourished, and in no distress. Skin: Gross inspection of skin demonstrates no evidence of abnormality. Head: Patient's head is normocephalic, no gross deformities. Eyes: no gross deformities noted. ENT: ears appear grossly normal. Nasopharynx appears to be normal. Neck: Supple. No thyromegaly. No LAD. Respiratory: Lungs  are clear to auscultation with no adventitious sounds. Cardiovascular: Normal S1 and S2 without murmur or rub. Extremities: No cyanosis. pulses are equal. Neurologic: Alert and oriented. No involuntary movements.  LABS: Recent Results (from the past 2160 hour(s))  CBC with Differential/Platelet     Status: None   Collection Time: 12/26/16  8:19 AM  Result Value Ref Range   WBC 5.6 3.4 - 10.8 x10E3/uL   RBC 4.40 4.14 - 5.80 x10E6/uL   Hemoglobin 13.9 13.0 - 17.7 g/dL   Hematocrit 40.942.0 81.137.5 - 51.0 %   MCV 96 79 - 97 fL   MCH 31.6 26.6 - 33.0 pg   MCHC 33.1 31.5 - 35.7 g/dL   RDW 91.413.3 78.212.3 - 95.615.4 %   Platelets 197 150 - 379 x10E3/uL   Neutrophils 63 Not Estab. %   Lymphs 30 Not Estab. %   Monocytes 6 Not Estab. %   Eos 1 Not Estab. %   Basos 0 Not Estab. %   Neutrophils Absolute 3.5 1.4 - 7.0 x10E3/uL   Lymphocytes Absolute 1.7 0.7 - 3.1 x10E3/uL   Monocytes Absolute 0.4 0.1 - 0.9 x10E3/uL   EOS (ABSOLUTE) 0.1 0.0 - 0.4 x10E3/uL   Basophils Absolute 0.0 0.0 - 0.2 x10E3/uL   Immature Granulocytes 0 Not Estab. %   Immature Grans (Abs) 0.0 0.0 - 0.1 x10E3/uL  Comprehensive metabolic panel     Status: None   Collection Time: 12/26/16  8:19 AM  Result Value Ref Range   Glucose 96 65 - 99 mg/dL   BUN 14 8 - 27 mg/dL   Creatinine, Ser 2.130.99 0.76 - 1.27 mg/dL    GFR calc non Af Amer 82 >59 mL/min/1.73   GFR calc Af Amer 95 >59 mL/min/1.73   BUN/Creatinine Ratio 14 10 - 24   Sodium 141 134 - 144 mmol/L   Potassium 3.7 3.5 - 5.2 mmol/L   Chloride 100 96 - 106 mmol/L   CO2 27 20 - 29 mmol/L   Calcium 9.1 8.6 - 10.2 mg/dL   Total Protein 6.1 6.0 - 8.5 g/dL   Albumin 4.2 3.6 - 4.8 g/dL   Globulin, Total 1.9 1.5 - 4.5 g/dL   Albumin/Globulin Ratio 2.2 1.2 - 2.2   Bilirubin Total 0.6 0.0 - 1.2 mg/dL   Alkaline Phosphatase 62 39 - 117 IU/L   AST 20 0 - 40 IU/L   ALT 16 0 - 44 IU/L  TSH     Status: None   Collection Time: 12/26/16  8:19 AM  Result Value Ref Range   TSH 1.720 0.450 - 4.500 uIU/mL  Lipid panel     Status: Abnormal   Collection Time: 12/26/16  8:19 AM  Result Value Ref Range   Cholesterol, Total 168 100 - 199 mg/dL   Triglycerides 086100 0 - 149 mg/dL   HDL 47 >57>39 mg/dL   VLDL Cholesterol Cal 20 5 - 40 mg/dL   LDL Calculated 846101 (H) 0 - 99 mg/dL   Chol/HDL Ratio 3.6 0.0 - 5.0 ratio    Comment:                                   T. Chol/HDL Ratio  Men  Women                               1/2 Avg.Risk  3.4    3.3                                   Avg.Risk  5.0    4.4                                2X Avg.Risk  9.6    7.1                                3X Avg.Risk 23.4   11.0     Radiology: No results found.  No results found.  No results found.    Assessment and Plan: Patient Active Problem List   Diagnosis Date Noted  . Thrombosed external hemorrhoid 05/10/2015  . Allergic rhinitis 11/07/2014  . Arthralgia of hand 11/07/2014  . Airway hyperreactivity 11/07/2014  . Benign fibroma of prostate 11/07/2014  . Impotence of organic origin 11/07/2014  . Acid reflux 11/07/2014  . BP (high blood pressure) 11/07/2014  . 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 Obstructive apnea 11/07/2014  . Plantar fasciitis 11/07/2014  . 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  Testicular hypofunction 10/26/2013  . Cellulitis 10/26/2013  . Essential (primary) hypertension 10/26/2013  . Other specified counseling 10/26/2013  . Febrile 10/26/2013  . Pain in soft tissues of limb 10/26/2013  . Encounter for screening for other viral diseases 09/29/2012  . ASTHMATIC BRONCHITIS, ACUTE 03/25/2007  . HYPERTENSION, SEVERE 03/15/2007  . RHINITIS 03/15/2007  . Asthma 03/15/2007  . ACID REFLUX DISEASE 03/15/2007  . COUGH, CHRONIC 03/15/2007    1. OSA Doing well with his CPAP  States he is doing well with full night sleep Download periodically  2. GERD Will follow with his PCP  3. Chronic Asthma Will continue with his inhalers symbicort   General Counseling: I have discussed the findings of the evaluation and examination with Cristal Deer.  I have also discussed any further diagnostic evaluation thatmay be needed or ordered today. Mart verbalizes understanding of the findings of todays visit. We also reviewed his medications today and discussed drug interactions and side effects including but not limited excessive drowsiness and altered mental states. We also discussed that there is always a risk not just to him but also people around him. he has been encouraged to call the office with any questions or concerns that should arise related to todays visit.    Time spent:  I have personally obtained a history, examined the patient, evaluated laboratory and imaging results, formulated the assessment and plan and placed orders.    Yevonne Pax, MD [facility] Pulmonary and Critical Care Sleep medicine

## 2017-03-16 NOTE — Patient Instructions (Signed)

## 2017-03-19 ENCOUNTER — Other Ambulatory Visit: Payer: Self-pay

## 2017-03-19 ENCOUNTER — Telehealth: Payer: Self-pay | Admitting: Urology

## 2017-03-19 DIAGNOSIS — N401 Enlarged prostate with lower urinary tract symptoms: Secondary | ICD-10-CM

## 2017-03-19 MED ORDER — TADALAFIL 5 MG PO TABS: ORAL_TABLET | ORAL | 3 refills | Status: DC

## 2017-03-19 NOTE — Telephone Encounter (Signed)
Patient is asking for a refill on Tadalafil to be called into his pharmacy. He said they sent in request on Monday.  Marcelino DusterMichelle

## 2017-03-19 NOTE — Telephone Encounter (Signed)
Medication has been refilled.

## 2017-03-22 ENCOUNTER — Other Ambulatory Visit: Payer: Self-pay | Admitting: Family Medicine

## 2017-04-16 ENCOUNTER — Other Ambulatory Visit: Payer: Self-pay | Admitting: Family Medicine

## 2017-05-06 ENCOUNTER — Ambulatory Visit: Payer: Self-pay

## 2017-05-13 ENCOUNTER — Ambulatory Visit: Payer: Self-pay | Admitting: Internal Medicine

## 2017-05-13 ENCOUNTER — Ambulatory Visit: Payer: BLUE CROSS/BLUE SHIELD

## 2017-05-13 DIAGNOSIS — G4733 Obstructive sleep apnea (adult) (pediatric): Secondary | ICD-10-CM

## 2017-05-13 NOTE — Progress Notes (Signed)
95 percentile pressure 11   95th percentile leak 4.3   apnea index 1.0 /hr  apnea-hypopnea index  1.5 /hr   total days used  >4 hr 180 days  total days used <4 hr 0 days  Total compliance 100 percent  Pt doing great no problems

## 2017-06-12 ENCOUNTER — Other Ambulatory Visit: Payer: BLUE CROSS/BLUE SHIELD

## 2017-06-19 ENCOUNTER — Ambulatory Visit: Payer: BLUE CROSS/BLUE SHIELD

## 2017-07-03 ENCOUNTER — Other Ambulatory Visit: Payer: Self-pay | Admitting: Family Medicine

## 2017-07-06 ENCOUNTER — Other Ambulatory Visit: Payer: BLUE CROSS/BLUE SHIELD

## 2017-07-06 ENCOUNTER — Ambulatory Visit: Payer: BLUE CROSS/BLUE SHIELD

## 2017-07-06 DIAGNOSIS — E291 Testicular hypofunction: Secondary | ICD-10-CM

## 2017-07-07 LAB — CBC WITH DIFFERENTIAL/PLATELET
BASOS ABS: 0 10*3/uL (ref 0.0–0.2)
Basos: 0 %
EOS (ABSOLUTE): 0.1 10*3/uL (ref 0.0–0.4)
Eos: 1 %
HEMATOCRIT: 42.3 % (ref 37.5–51.0)
Hemoglobin: 14.3 g/dL (ref 13.0–17.7)
Immature Grans (Abs): 0 10*3/uL (ref 0.0–0.1)
Immature Granulocytes: 0 %
LYMPHS ABS: 1.7 10*3/uL (ref 0.7–3.1)
Lymphs: 28 %
MCH: 32.3 pg (ref 26.6–33.0)
MCHC: 33.8 g/dL (ref 31.5–35.7)
MCV: 96 fL (ref 79–97)
MONOCYTES: 6 %
MONOS ABS: 0.4 10*3/uL (ref 0.1–0.9)
NEUTROS ABS: 4 10*3/uL (ref 1.4–7.0)
Neutrophils: 65 %
Platelets: 181 10*3/uL (ref 150–379)
RBC: 4.43 x10E6/uL (ref 4.14–5.80)
RDW: 13.8 % (ref 12.3–15.4)
WBC: 6.1 10*3/uL (ref 3.4–10.8)

## 2017-07-07 LAB — TESTOSTERONE: TESTOSTERONE: 509 ng/dL (ref 264–916)

## 2017-07-07 LAB — PSA: PROSTATE SPECIFIC AG, SERUM: 0.8 ng/mL (ref 0.0–4.0)

## 2017-07-08 ENCOUNTER — Ambulatory Visit: Payer: Self-pay

## 2017-07-09 ENCOUNTER — Ambulatory Visit: Payer: BLUE CROSS/BLUE SHIELD | Admitting: Urology

## 2017-07-09 ENCOUNTER — Encounter: Payer: Self-pay | Admitting: Urology

## 2017-07-09 VITALS — BP 113/74 | HR 78 | Ht 72.0 in | Wt 217.6 lb

## 2017-07-09 DIAGNOSIS — N400 Enlarged prostate without lower urinary tract symptoms: Secondary | ICD-10-CM

## 2017-07-09 DIAGNOSIS — E291 Testicular hypofunction: Secondary | ICD-10-CM | POA: Diagnosis not present

## 2017-07-09 DIAGNOSIS — Z125 Encounter for screening for malignant neoplasm of prostate: Secondary | ICD-10-CM | POA: Diagnosis not present

## 2017-07-09 MED ORDER — TESTOSTERONE 30 MG/ACT TD SOLN: TRANSDERMAL | 5 refills | Status: DC

## 2017-07-09 MED ORDER — TAMSULOSIN HCL 0.4 MG PO CAPS
0.4000 mg | ORAL_CAPSULE | Freq: Every day | ORAL | 11 refills | Status: DC
Start: 2017-07-09 — End: 2018-07-12

## 2017-07-09 MED ORDER — DUTASTERIDE 0.5 MG PO CAPS: 0.5000 mg | ORAL_CAPSULE | Freq: Every day | ORAL | 11 refills | Status: DC

## 2017-07-09 NOTE — Progress Notes (Signed)
07/09/2017 3:42 PM   [name] [name] September 16, 1955 [id number]  Referring provider: Maple Hudson., MD [address] [place], [place] [address]  Chief Complaint  Patient presents with  . Hypogonadism    HPI: The patient is a 62 year old gentleman with a past medical history of BPH on Avodart and tamsulosin and hypogonadism on axiron presents for annual follow up  1. BPH Currently on Avodart and Flomax.  His urinary symptoms are well controlled, and he is very happy with his urinary quality of life.  He has nocturia x1.  He has an occasional slow stream.  He does not find this bothersome.  He feels that he empties his bladder.  He denies intermittency.  He denies urgency.  2.Hypogonadism He has been on Axiron (one swipe per axilla daily) for hypogonadism for many years. He feels like he has a good sex drive with no fatigue.  He has good erections.  He denies any sudden weight gain or loss of muscle mass.  His testosterone levels have been consistently between 400-600 while on Axiron.  April 2019 labs: T: 509 PSA: 0.8 Hct: 42.3    Patient denies any other changes to his urinary status over the last year.  He denies hematuria, nephrolithiasis, UTI.  PMH: Past Medical History:  Diagnosis Date  . Allergy   . Asthma   . BP (high blood pressure) 11/07/2014   Should stop combination of Tekturna (DRI)  and ARB   . Hypertension   . HYPERTENSION, SEVERE 03/15/2007   Qualifier: Diagnosis of  By: Vernie Murders    . Prostate enlargement     Surgical History: Past Surgical History:  Procedure Laterality Date  . APPENDECTOMY  1990   Dr Lemar Livings  . COLONOSCOPY  2008   Dr Lemar Livings  . COLONOSCOPY WITH PROPOFOL N/A 08/13/2016   Procedure: COLONOSCOPY WITH PROPOFOL;  Surgeon: Earline Mayotte, MD;  Location: [facility] ENDOSCOPY;  Service: Endoscopy;  Laterality: N/A;  . HEMORRHOID SURGERY    . HERNIA REPAIR  01/20/14   umbilical hernia  . KNEE ARTHROSCOPY Left   .  KNEE SURGERY Left 1986  . TONSILLECTOMY    . VASECTOMY      Home Medications:  Allergies as of 07/09/2017      Reactions   Hydralazine Hives   Chest tightness, severe headache, dyspnea   Moxifloxacin Hives   Avelox   Pseudoephedrine Other (See Comments)   Confusion, passed out, numbness   Pseudoephedrine Hcl    Other reaction(s): Syncope   Sulfa Antibiotics Hives   Sulfonamide Derivatives Hives      Medication List        Accurate as of 07/09/17  3:42 PM. Always use your most recent med list.          ALAWAY 0.025 % ophthalmic solution Generic drug:  ketotifen Place 1 drop into both eyes daily. 1 drop 2 (two) times daily.   calcium citrate 950 MG tablet Commonly known as:  CALCITRATE - dosed in mg elemental calcium Take 200 mg of elemental calcium by mouth daily.   cetirizine 5 MG tablet Commonly known as:  ZYRTEC Take 5 mg by mouth daily.   D 2000 2000 units Tabs Generic drug:  Cholecalciferol Take by mouth daily.   DEXILANT 60 MG capsule Generic drug:  dexlansoprazole TAKE 1 CAPSULE EVERY DAY   dextromethorphan-guaiFENesin 30-600 MG 12hr tablet Commonly known as:  MUCINEX DM Take 1 tablet by mouth 2 (two) times daily as needed for  cough.   dutasteride 0.5 MG capsule Commonly known as:  AVODART Take 1 capsule (0.5 mg total) by mouth daily.   DYMISTA 137-50 MCG/ACT Susp Generic drug:  Azelastine-Fluticasone 2 (two) times daily. by Nasal route.   EPIPEN 2-PAK 0.3 mg/0.3 mL Soaj injection Generic drug:  EPINEPHrine USE AS DIRECTED AS NEEDED   fluticasone 50 MCG/ACT nasal spray Commonly known as:  FLONASE   glucosamine-chondroitin 500-400 MG tablet Take 1 tablet by mouth 2 (two) times daily.   hydrochlorothiazide 25 MG tablet Commonly known as:  HYDRODIURIL TAKE 1 TABLET (25 MG TOTAL) BY MOUTH DAILY.   losartan 100 MG tablet Commonly known as:  COZAAR Take 1 tablet (100 mg total) by mouth daily.   MULTI-VITAMINS Tabs Take by mouth daily.     naproxen 500 MG tablet Commonly known as:  NAPROSYN TAKE 1 TABLET BY MOUTH TWICE A DAY AS NEEDED FOR PAIN   PROAIR HFA 108 (90 Base) MCG/ACT inhaler Generic drug:  albuterol INHALE 1-2 PUFFS AS NEEDED EVERY 4 TO 6 HOURS AS NEEDED FOR COUGH/WHEEZE. MUST LAST 90 DAYS PER MD   ranitidine 300 MG capsule Commonly known as:  ZANTAC TAKE 1 CAPSULE (300 MG TOTAL) BY MOUTH EVERY EVENING.   SYMBICORT 160-4.5 MCG/ACT inhaler Generic drug:  budesonide-formoterol Inhale 2 puffs into the lungs 2 (two) times daily.   tadalafil 5 MG tablet Commonly known as:  CIALIS TAKE 1 TABLET BY MOUTH EVERY DAY AS NEEDED ERECTILE DYSFUNCTION   tamsulosin 0.4 MG Caps capsule Commonly known as:  FLOMAX Take 1 capsule (0.4 mg total) by mouth daily.   Testosterone 30 MG/ACT Soln Commonly known as:  AXIRON One swipe per axilla daily   Turmeric 500 MG Caps Take 1 capsule by mouth daily.   verapamil 240 MG CR tablet Commonly known as:  CALAN-SR TAKE 1 TABLET TWICE A DAY       Allergies:  Allergies  Allergen Reactions  . Hydralazine Hives    Chest tightness, severe headache, dyspnea  . Moxifloxacin Hives    Avelox  . Pseudoephedrine Other (See Comments)    Confusion, passed out, numbness  . Pseudoephedrine Hcl     Other reaction(s): Syncope  . Sulfa Antibiotics Hives  . Sulfonamide Derivatives Hives    Family History: Family History  Problem Relation Age of Onset  . Cancer Mother        ovarian  . Glaucoma Mother   . Cancer Father        prostate  . Hypertension Sister   . Asthma Sister   . Alcohol abuse Paternal Uncle   . Liver disease Paternal Uncle   . Cancer - Prostate Paternal Uncle   . Cancer Maternal Uncle        lung; two mat uncles  . Cancer Paternal Grandmother        breast  . Cancer Paternal Aunt        lung cancer in a smoker    Social History:  reports that he has never smoked. He has never used smokeless tobacco. He reports that he drinks alcohol. He reports that  he does not use drugs.  ROS: UROLOGY Frequent Urination?: No Hard to postpone urination?: No Burning/pain with urination?: No Get up at night to urinate?: No Leakage of urine?: No Urine stream starts and stops?: No Trouble starting stream?: No Do you have to strain to urinate?: No Blood in urine?: No Urinary tract infection?: No Sexually transmitted disease?: No Injury to kidneys or bladder?: No  Painful intercourse?: No Weak stream?: No Erection problems?: No Penile pain?: No  Gastrointestinal Nausea?: No Vomiting?: No Indigestion/heartburn?: No Diarrhea?: No Constipation?: No  Constitutional Fever: No Night sweats?: No Weight loss?: No Fatigue?: No  Skin Skin rash/lesions?: No Itching?: No  Eyes Blurred vision?: No Double vision?: No  Ears/Nose/Throat Sore throat?: No Sinus problems?: No  Hematologic/Lymphatic Swollen glands?: No Easy bruising?: Yes  Cardiovascular Leg swelling?: No Chest pain?: No  Respiratory Cough?: Yes Shortness of breath?: No  Endocrine Excessive thirst?: No  Musculoskeletal Back pain?: No Joint pain?: No  Neurological Headaches?: No Dizziness?: No  Psychologic Depression?: No Anxiety?: No  Physical Exam: BP 113/74 (BP Location: Right Arm, Patient Position: Sitting, Cuff Size: Normal)   Pulse 78   Ht 6' (1.829 m)   Wt 217 lb 9.6 oz (98.7 kg)   BMI 29.51 kg/m   Constitutional:  Alert and oriented, No acute distress. HEENT: [place] AT, moist mucus membranes.  Trachea midline, no masses. Cardiovascular: No clubbing, cyanosis, or edema. Respiratory: Normal respiratory effort, no increased work of breathing. GI: Abdomen is soft, nontender, nondistended, no abdominal masses GU: No CVA tenderness. DRE: 2+ smooth, benign. Skin: No rashes, bruises or suspicious lesions. Lymph: No cervical or inguinal adenopathy. Neurologic: Grossly intact, no focal deficits, moving all 4 extremities. Psychiatric: Normal mood and  affect.  Laboratory Data: Lab Results  Component Value Date   WBC 6.1 07/06/2017   HGB 14.3 07/06/2017   HCT 42.3 07/06/2017   MCV 96 07/06/2017   PLT 181 07/06/2017    Lab Results  Component Value Date   CREATININE 0.99 12/26/2016    No results found for: PSA  Lab Results  Component Value Date   TESTOSTERONE 509 07/06/2017    No results found for: HGBA1C  Urinalysis    Component Value Date/Time   APPEARANCEUR Clear 06/20/2015 0833   GLUCOSEU Negative 06/20/2015 0833   BILIRUBINUR neg 12/19/2015 0926   BILIRUBINUR Negative 06/20/2015 0833   PROTEINUR neg 12/19/2015 0926   PROTEINUR Negative 06/20/2015 0833   UROBILINOGEN 0.2 12/19/2015 0926   NITRITE neg 12/19/2015 0926   NITRITE Negative 06/20/2015 0833   LEUKOCYTESUR Negative 12/19/2015 0926   LEUKOCYTESUR Negative 06/20/2015 0833    Assessment & Plan:    1. BPH -continue avodart, flomax  2. Hypogonadism -continue axiron -Patient has been stable on this medication for many years. Follow up annually with T,PSA, Hct prior  3. Prostate cancer screening -up to date  Return in about 1 year (around 07/10/2018) for w/ T/PSA/Hct prior.  Hildred Laser, MD  [facility] [address] [place], [place] [address] [phone]

## 2017-07-13 ENCOUNTER — Other Ambulatory Visit: Payer: Self-pay | Admitting: Family Medicine

## 2017-07-16 ENCOUNTER — Telehealth: Payer: Self-pay | Admitting: Urology

## 2017-07-16 DIAGNOSIS — N401 Enlarged prostate with lower urinary tract symptoms: Secondary | ICD-10-CM

## 2017-07-16 MED ORDER — TADALAFIL 5 MG PO TABS: ORAL_TABLET | ORAL | 3 refills | Status: DC

## 2017-07-16 NOTE — Telephone Encounter (Signed)
RX sent

## 2017-07-16 NOTE — Telephone Encounter (Signed)
Patient requesting refill of generic Cialis to be sent to [facility] on [address]  Please call him if any issues.

## 2017-08-10 ENCOUNTER — Encounter: Payer: Self-pay | Admitting: Internal Medicine

## 2017-08-10 ENCOUNTER — Ambulatory Visit (INDEPENDENT_AMBULATORY_CARE_PROVIDER_SITE_OTHER): Payer: BLUE CROSS/BLUE SHIELD | Admitting: Internal Medicine

## 2017-08-10 VITALS — BP 150/82 | HR 76 | Resp 16 | Ht 72.0 in | Wt 217.0 lb

## 2017-08-10 DIAGNOSIS — Z9989 Dependence on other enabling machines and devices: Secondary | ICD-10-CM | POA: Diagnosis not present

## 2017-08-10 DIAGNOSIS — J209 Acute bronchitis, unspecified: Secondary | ICD-10-CM | POA: Diagnosis not present

## 2017-08-10 DIAGNOSIS — J4520 Mild intermittent asthma, uncomplicated: Secondary | ICD-10-CM

## 2017-08-10 DIAGNOSIS — G4733 Obstructive sleep apnea (adult) (pediatric): Secondary | ICD-10-CM

## 2017-08-10 NOTE — Patient Instructions (Signed)
obstructive sleep apnea  Sleep Apnea Sleep apnea is a condition that affects breathing. People with sleep apnea have moments during sleep when their breathing pauses briefly or gets shallow. Sleep apnea can cause these symptoms:  Trouble staying asleep.  Sleepiness or tiredness during the day.  Irritability.  Loud snoring.  Morning headaches.  Trouble concentrating.  Forgetting things.  Less interest in sex.  Being sleepy for no reason.  Mood swings.  Personality changes.  Depression.  Waking up a lot during the night to pee (urinate).  Dry mouth.  Sore throat.  Follow these instructions at home:  Make any changes in your routine that your doctor recommends.  Eat a healthy, well-balanced diet.  Take over-the-counter and prescription medicines only as told by your doctor.  Avoid using alcohol, calming medicines (sedatives), and narcotic medicines.  Take steps to lose weight if you are overweight.  If you were given a machine (device) to use while you sleep, use it only as told by your doctor.  Do not use any tobacco products, such as cigarettes, chewing tobacco, and e-cigarettes. If you need help quitting, ask your doctor.  Keep all follow-up visits as told by your doctor. This is important. Contact a doctor if:  The machine that you were given to use during sleep is uncomfortable or does not seem to be working.  Your symptoms do not get better.  Your symptoms get worse. Get help right away if:  Your chest hurts.  You have trouble breathing in enough air (shortness of breath).  You have an uncomfortable feeling in your back, arms, or stomach.  You have trouble talking.  One side of your body feels weak.  A part of your face is hanging down (drooping). These symptoms may be an emergency. Do not wait to see if the symptoms will go away. Get medical help right away. Call your local emergency services (911 in the [place] Do not drive yourself to the  hospital. This information is not intended to replace advice given to you by your health care provider. Make sure you discuss any questions you have with your health care provider. Document Released: 12/04/2007 Document Revised: 10/21/2015 Document Reviewed: 12/04/2014 Elsevier Interactive Patient Education  [employer]2018 [employer]

## 2017-08-10 NOTE — Progress Notes (Signed)
[facility] [address] [place], [place] [address]  Pulmonary Sleep Medicine   Office Visit Note  Patient Name: [name] [name] DOB: 1955-08-23 MRN [id number]  Date of Service: 08/10/2017  Complaints/HPI: Patient is doing well.  He had a download done shows 100% compliance with an apnea-hypopnea index of 1.0.  He had a little bit of issues with bronchitis which was treated with antibiotics.  He is now doing better.  Asthma has been under good control.  Allergies have been a little bit more difficult this year because of pollen season.  He is taking antihistamines for this purpose.  ROS  General: (-) fever, (-) chills, (-) night sweats, (-) weakness Skin: (-) rashes, (-) itching,. Eyes: (-) visual changes, (-) redness, (-) itching. Nose and Sinuses: (-) nasal stuffiness or itchiness, (-) postnasal drip, (-) nosebleeds, (-) sinus trouble. Mouth and Throat: (-) sore throat, (-) hoarseness. Neck: (-) swollen glands, (-) enlarged thyroid, (-) neck pain. Respiratory: - cough, (-) bloody sputum, - shortness of breath, - wheezing. Cardiovascular: - ankle swelling, (-) chest pain. Lymphatic: (-) lymph node enlargement. Neurologic: (-) numbness, (-) tingling. Psychiatric: (-) anxiety, (-) depression   Current Medication: Outpatient Encounter Medications as of 08/10/2017  Medication Sig Note  . Azelastine HCl 137 MCG/SPRAY SOLN Place 1 spray into the nose 2 (two) times daily.   . fluticasone (FLONASE) 50 MCG/ACT nasal spray Place 1 spray into both nostrils daily.   Marland Kitchen albuterol (PROAIR HFA) 108 (90 Base) MCG/ACT inhaler INHALE 1-2 PUFFS AS NEEDED EVERY 4 TO 6 HOURS AS NEEDED FOR COUGH/WHEEZE. MUST LAST 90 DAYS PER MD   . budesonide-formoterol (SYMBICORT) 160-4.5 MCG/ACT inhaler Inhale 2 puffs into the lungs 2 (two) times daily.   . calcium citrate (CALCITRATE - DOSED IN MG ELEMENTAL CALCIUM) 950 MG tablet Take 200 mg of elemental calcium by mouth daily.   . Cholecalciferol  (D 2000) 2000 UNITS TABS Take by mouth daily.  11/02/2013: Received from: [facility]  . DEXILANT 60 MG capsule TAKE 1 CAPSULE EVERY DAY   . dextromethorphan-guaiFENesin (MUCINEX DM) 30-600 MG 12hr tablet Take 1 tablet by mouth 2 (two) times daily as needed for cough.   . dutasteride (AVODART) 0.5 MG capsule Take 1 capsule (0.5 mg total) by mouth daily.   Marland Kitchen EPIPEN 2-PAK 0.3 MG/0.3ML SOAJ injection USE AS DIRECTED AS NEEDED   . fluticasone (FLONASE) 50 MCG/ACT nasal spray    . glucosamine-chondroitin 500-400 MG tablet Take 1 tablet by mouth 2 (two) times daily.   . hydrochlorothiazide (HYDRODIURIL) 25 MG tablet TAKE 1 TABLET BY MOUTH EVERY DAY   . ketotifen (ALAWAY) 0.025 % ophthalmic solution Place 1 drop into both eyes daily. 1 drop 2 (two) times daily. 11/02/2013: Received from: [facility]  . losartan (COZAAR) 100 MG tablet Take 1 tablet (100 mg total) by mouth daily.   . Multiple Vitamin (MULTI-VITAMINS) TABS Take by mouth daily.  11/02/2013: Received from: [facility]  . naproxen (NAPROSYN) 500 MG tablet TAKE 1 TABLET BY MOUTH TWICE A DAY AS NEEDED FOR PAIN (Patient not taking: Reported on 07/09/2017)   . ranitidine (ZANTAC) 300 MG capsule TAKE 1 CAPSULE (300 MG TOTAL) BY MOUTH EVERY EVENING.   . tadalafil (CIALIS) 5 MG tablet TAKE 1 TABLET BY MOUTH EVERY DAY AS NEEDED ERECTILE DYSFUNCTION   . tamsulosin (FLOMAX) 0.4 MG CAPS capsule Take 1 capsule (0.4 mg total) by mouth daily.   . Testosterone (AXIRON) 30 MG/ACT SOLN  One swipe per axilla daily   . Turmeric 500 MG CAPS Take 1 capsule by mouth daily.   . verapamil (CALAN-SR) 240 MG CR tablet TAKE 1 TABLET TWICE A DAY   . [DISCONTINUED] Azelastine-Fluticasone (DYMISTA) 137-50 MCG/ACT SUSP 2 (two) times daily. by Nasal route. 11/02/2013: Received from: [facility]  . [DISCONTINUED] cetirizine (ZYRTEC) 5 MG tablet Take 5 mg by mouth daily.    No facility-administered encounter  medications on file as of 08/10/2017.     Surgical History: Past Surgical History:  Procedure Laterality Date  . APPENDECTOMY  1990   Dr Lemar Livings  . COLONOSCOPY  2008   Dr Lemar Livings  . COLONOSCOPY WITH PROPOFOL N/A 08/13/2016   Procedure: COLONOSCOPY WITH PROPOFOL;  Surgeon: Earline Mayotte, MD;  Location: [facility] ENDOSCOPY;  Service: Endoscopy;  Laterality: N/A;  . HEMORRHOID SURGERY    . HERNIA REPAIR  01/20/14   umbilical hernia  . KNEE ARTHROSCOPY Left   . KNEE SURGERY Left 1986  . TONSILLECTOMY    . VASECTOMY      Medical History: Past Medical History:  Diagnosis Date  . Allergy   . Asthma   . BP (high blood pressure) 11/07/2014   Should stop combination of Tekturna (DRI)  and ARB   . Hypertension   . HYPERTENSION, SEVERE 03/15/2007   Qualifier: Diagnosis of  By: Vernie Murders    . Prostate enlargement   . Sleep apnea     Family History: Family History  Problem Relation Age of Onset  . Cancer Mother        ovarian  . Glaucoma Mother   . Cancer Father        prostate  . Hypertension Sister   . Asthma Sister   . Alcohol abuse Paternal Uncle   . Liver disease Paternal Uncle   . Cancer - Prostate Paternal Uncle   . Cancer Maternal Uncle        lung; two mat uncles  . Cancer Paternal Grandmother        breast  . Cancer Paternal Aunt        lung cancer in a smoker    Social History: Social History   Socioeconomic History  . Marital status: Married    Spouse name: Not on file  . Number of children: Not on file  . Years of education: Not on file  . Highest education level: Not on file  Occupational History  . Not on file  Social Needs  . Financial resource strain: Not on file  . Food insecurity:    Worry: Not on file    Inability: Not on file  . Transportation needs:    Medical: Not on file    Non-medical: Not on file  Tobacco Use  . Smoking status: Never Smoker  . Smokeless tobacco: Never Used  . Tobacco comment: Exposed to second hand smoke as a  child  Substance and Sexual Activity  . Alcohol use: Yes    Alcohol/week: 0.0 oz    Comment: occasionally  . Drug use: No  . Sexual activity: Not on file  Lifestyle  . Physical activity:    Days per week: Not on file    Minutes per session: Not on file  . Stress: Not on file  Relationships  . Social connections:    Talks on phone: Not on file    Gets together: Not on file    Attends religious service: Not on file    Active member  of club or organization: Not on file    Attends meetings of clubs or organizations: Not on file    Relationship status: Not on file  . Intimate partner violence:    Fear of current or ex partner: Not on file    Emotionally abused: Not on file    Physically abused: Not on file    Forced sexual activity: Not on file  Other Topics Concern  . Not on file  Social History Narrative  . Not on file    Vital Signs: Blood pressure (!) 150/82, pulse 76, resp. rate 16, height 6' (1.829 m), weight 217 lb (98.4 kg), SpO2 96 %.  Examination: General Appearance: The patient is well-developed, well-nourished, and in no distress. Skin: Gross inspection of skin unremarkable. Head: normocephalic, no gross deformities. Eyes: no gross deformities noted. ENT: ears appear grossly normal no exudates. Neck: Supple. No thyromegaly. No LAD. Respiratory: no rhonchi. Cardiovascular: Normal S1 and S2 without murmur or rub. Extremities: No cyanosis. pulses are equal. Neurologic: Alert and oriented. No involuntary movements.  LABS: Recent Results (from the past 2160 hour(s))  PSA     Status: None   Collection Time: 07/06/17  8:36 AM  Result Value Ref Range   Prostate Specific Ag, Serum 0.8 0.0 - 4.0 ng/mL    Comment: Roche ECLIA methodology. According to the American Urological Association, Serum PSA should decrease and remain at undetectable levels after radical prostatectomy. The AUA defines biochemical recurrence as an initial PSA value 0.2 ng/mL or greater  followed by a subsequent confirmatory PSA value 0.2 ng/mL or greater. Values obtained with different assay methods or kits cannot be used interchangeably. Results cannot be interpreted as absolute evidence of the presence or absence of malignant disease.   CBC with Differential/Platelet     Status: None   Collection Time: 07/06/17  8:36 AM  Result Value Ref Range   WBC 6.1 3.4 - 10.8 x10E3/uL   RBC 4.43 4.14 - 5.80 x10E6/uL   Hemoglobin 14.3 13.0 - 17.7 g/dL   Hematocrit 82.942.3 56.237.5 - 51.0 %   MCV 96 79 - 97 fL   MCH 32.3 26.6 - 33.0 pg   MCHC 33.8 31.5 - 35.7 g/dL   RDW 13.013.8 86.512.3 - 78.415.4 %   Platelets 181 150 - 379 x10E3/uL   Neutrophils 65 Not Estab. %   Lymphs 28 Not Estab. %   Monocytes 6 Not Estab. %   Eos 1 Not Estab. %   Basos 0 Not Estab. %   Neutrophils Absolute 4.0 1.4 - 7.0 x10E3/uL   Lymphocytes Absolute 1.7 0.7 - 3.1 x10E3/uL   Monocytes Absolute 0.4 0.1 - 0.9 x10E3/uL   EOS (ABSOLUTE) 0.1 0.0 - 0.4 x10E3/uL   Basophils Absolute 0.0 0.0 - 0.2 x10E3/uL   Immature Granulocytes 0 Not Estab. %   Immature Grans (Abs) 0.0 0.0 - 0.1 x10E3/uL  Testosterone     Status: None   Collection Time: 07/06/17  8:36 AM  Result Value Ref Range   Testosterone 509 264 - 916 ng/dL    Comment: Adult male reference interval is based on a population of healthy nonobese males (BMI <30) between 7919 and 482 years old. Travison, et.al. JCEM [id number],102;1161-1173. PMID: [id number].     Radiology: No results found.  No results found.  No results found.    Assessment and Plan: Patient Active Problem List   Diagnosis Date Noted  . Thrombosed external hemorrhoid 05/10/2015  . Allergic rhinitis 11/07/2014  . Arthralgia of hand  11/07/2014  . Airway hyperreactivity 11/07/2014  . Benign fibroma of prostate 11/07/2014  . Impotence of organic origin 11/07/2014  . Acid reflux 11/07/2014  . BP (high blood pressure) 11/07/2014  . 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 Obstructive apnea 11/07/2014  .  Plantar fasciitis 11/07/2014  . 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 Testicular hypofunction 10/26/2013  . Cellulitis 10/26/2013  . Essential (primary) hypertension 10/26/2013  . Other specified counseling 10/26/2013  . Febrile 10/26/2013  . Pain in soft tissues of limb 10/26/2013  . Need for prophylactic vaccination with combined diphtheria-tetanus-pertussis (DTP) vaccine 10/26/2013  . Palpitations 10/26/2013  . Personal history of perinatal problems 10/26/2013  . Encounter for screening for other viral diseases 09/29/2012  . ASTHMATIC BRONCHITIS, ACUTE 03/25/2007  . HYPERTENSION, SEVERE 03/15/2007  . RHINITIS 03/15/2007  . Asthma 03/15/2007  . ACID REFLUX DISEASE 03/15/2007  . COUGH, CHRONIC 03/15/2007    1. OSA  Continue with CPAP on the present pressures.  We will continue with supportive care pulmonary toilet.  We will continue to do down most check for compliance will continue monitoring. 2. Acute bronchitis  Stable at this time we will continue with supportive care patient is resolved symptoms 3. Asthma clinically stable we will continue to monitor  General Counseling: I have discussed the findings of the evaluation and examination with Cristal Deer.  I have also discussed any further diagnostic evaluation thatmay be needed or ordered today. Kailin verbalizes understanding of the findings of todays visit. We also reviewed his medications today and discussed drug interactions and side effects including but not limited excessive drowsiness and altered mental states. We also discussed that there is always a risk not just to him but also people around him. he has been encouraged to call the office with any questions or concerns that should arise related to todays visit.    Time spent:  I have personally obtained a history, examined the patient, evaluated laboratory and imaging results, formulated the assessment and plan and placed orders.     Yevonne Pax, MD [facility] Pulmonary and Critical Care Sleep medicine

## 2017-08-17 DIAGNOSIS — M659 Synovitis and tenosynovitis, unspecified: Secondary | ICD-10-CM | POA: Insufficient documentation

## 2017-10-02 ENCOUNTER — Other Ambulatory Visit: Payer: Self-pay | Admitting: Family Medicine

## 2017-10-02 DIAGNOSIS — I10 Essential (primary) hypertension: Secondary | ICD-10-CM

## 2017-11-10 ENCOUNTER — Telehealth: Payer: Self-pay | Admitting: Urology

## 2017-11-10 NOTE — Telephone Encounter (Signed)
Pt asking for refill of Cialis. Pt states pharmacy told him the Dr rejected his refill. Please advise pt at [phone]. [facility] [address], [facility].

## 2017-11-13 ENCOUNTER — Telehealth: Payer: Self-pay | Admitting: Urology

## 2017-11-13 DIAGNOSIS — N401 Enlarged prostate with lower urinary tract symptoms: Secondary | ICD-10-CM

## 2017-11-13 MED ORDER — TADALAFIL 5 MG PO TABS: ORAL_TABLET | ORAL | 3 refills | Status: DC

## 2017-11-13 NOTE — Telephone Encounter (Signed)
See message from 11/13/17.

## 2017-11-13 NOTE — Telephone Encounter (Signed)
Rx sent 

## 2017-11-13 NOTE — Telephone Encounter (Signed)
Can you refill patients cilias? He use to see Sherryl Barters or does he need an OV?  Thanks, Marcelino Duster

## 2017-11-14 ENCOUNTER — Other Ambulatory Visit: Payer: Self-pay | Admitting: Family Medicine

## 2017-11-18 ENCOUNTER — Ambulatory Visit: Payer: Self-pay

## 2017-11-18 DIAGNOSIS — G4733 Obstructive sleep apnea (adult) (pediatric): Secondary | ICD-10-CM

## 2017-11-18 NOTE — Progress Notes (Signed)
95 percentile pressure 11.2   95th percentile leak 14.7   apnea index 1.0 /hr  apnea-hypopnea index  1.5 /hr   total days used  >4 hr 90 days  total days used <4 hr 0 days  Total compliance 100 percent  Patient doing great no problems or questions at this time

## 2017-12-01 ENCOUNTER — Ambulatory Visit: Payer: BLUE CROSS/BLUE SHIELD

## 2017-12-23 ENCOUNTER — Ambulatory Visit (INDEPENDENT_AMBULATORY_CARE_PROVIDER_SITE_OTHER): Payer: BLUE CROSS/BLUE SHIELD | Admitting: Family Medicine

## 2017-12-23 ENCOUNTER — Encounter: Payer: Self-pay | Admitting: Family Medicine

## 2017-12-23 VITALS — BP 128/74 | HR 78 | Temp 98.4°F | Resp 16 | Ht 72.0 in | Wt 217.0 lb

## 2017-12-23 DIAGNOSIS — I10 Essential (primary) hypertension: Secondary | ICD-10-CM | POA: Diagnosis not present

## 2017-12-23 DIAGNOSIS — Z0000 Encounter for general adult medical examination without abnormal findings: Secondary | ICD-10-CM | POA: Diagnosis not present

## 2017-12-23 DIAGNOSIS — J42 Unspecified chronic bronchitis: Secondary | ICD-10-CM

## 2017-12-23 MED ORDER — DOXYCYCLINE HYCLATE 100 MG PO TABS: 100.0000 mg | ORAL_TABLET | Freq: Two times a day (BID) | ORAL | 0 refills | Status: AC

## 2017-12-23 NOTE — Progress Notes (Signed)
Patient: [name] [name], Male    DOB: 08-28-1955, 62 y.o.   MRN: [id number] Visit Date: 12/23/2017  Today's Provider: Megan Mans, MD   Chief Complaint  Patient presents with  . Annual Exam   Subjective:    Annual physical exam [name] [name] is a 62 y.o. male who presents today for health maintenance and complete physical. He feels well. He reports exercising  . He reports he is sleeping well.   Colonoscopy- 08/13/2016. Dr. Lemar Livings, hyperplastic polyps. Repeat in 10 years.   Review of Systems  Constitutional: Negative.   HENT: Positive for postnasal drip.   Eyes: Negative.   Respiratory: Positive for cough.   Cardiovascular: Negative.   Gastrointestinal: Negative.   Endocrine: Negative.   Genitourinary: Negative.   Musculoskeletal: Positive for arthralgias.  Skin: Negative.   Allergic/Immunologic: Positive for environmental allergies.  Neurological: Negative.   Hematological: Negative.   Psychiatric/Behavioral: Negative.     Social History      He  reports that he has never smoked. He has never used smokeless tobacco. He reports that he drinks alcohol. He reports that he does not use drugs.       Social History   Socioeconomic History  . Marital status: Married    Spouse name: Not on file  . Number of children: Not on file  . Years of education: Not on file  . Highest education level: Not on file  Occupational History  . Not on file  Social Needs  . Financial resource strain: Not on file  . Food insecurity:    Worry: Not on file    Inability: Not on file  . Transportation needs:    Medical: Not on file    Non-medical: Not on file  Tobacco Use  . Smoking status: Never Smoker  . Smokeless tobacco: Never Used  . Tobacco comment: Exposed to second hand smoke as a child  Substance and Sexual Activity  . Alcohol use: Yes    Alcohol/week: 0.0 standard drinks    Comment: occasionally  . Drug use: No  . Sexual activity: Not on  file  Lifestyle  . Physical activity:    Days per week: Not on file    Minutes per session: Not on file  . Stress: Not on file  Relationships  . Social connections:    Talks on phone: Not on file    Gets together: Not on file    Attends religious service: Not on file    Active member of club or organization: Not on file    Attends meetings of clubs or organizations: Not on file    Relationship status: Not on file  Other Topics Concern  . Not on file  Social History Narrative  . Not on file    Past Medical History:  Diagnosis Date  . Allergy   . Asthma   . BP (high blood pressure) 11/07/2014   Should stop combination of Tekturna (DRI)  and ARB   . Hypertension   . HYPERTENSION, SEVERE 03/15/2007   Qualifier: Diagnosis of  By: Vernie Murders    . Prostate enlargement   . Sleep apnea      Patient Active Problem List   Diagnosis Date Noted  . Thrombosed external hemorrhoid 05/10/2015  . Allergic rhinitis 11/07/2014  . Arthralgia of hand 11/07/2014  . Airway hyperreactivity 11/07/2014  . Benign fibroma of prostate 11/07/2014  . Impotence of organic origin 11/07/2014  . Acid reflux 11/07/2014  .  BP (high blood pressure) 11/07/2014  . 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 Obstructive apnea 11/07/2014  . Plantar fasciitis 11/07/2014  . 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 Testicular hypofunction 10/26/2013  . Cellulitis 10/26/2013  . Essential (primary) hypertension 10/26/2013  . Other specified counseling 10/26/2013  . Febrile 10/26/2013  . Pain in soft tissues of limb 10/26/2013  . Need for prophylactic vaccination with combined diphtheria-tetanus-pertussis (DTP) vaccine 10/26/2013  . Palpitations 10/26/2013  . Personal history of perinatal problems 10/26/2013  . Encounter for screening for other viral diseases 09/29/2012  . ASTHMATIC BRONCHITIS, ACUTE 03/25/2007  . HYPERTENSION, SEVERE 03/15/2007  . RHINITIS 03/15/2007  . Asthma 03/15/2007  . ACID  REFLUX DISEASE 03/15/2007  . COUGH, CHRONIC 03/15/2007    Past Surgical History:  Procedure Laterality Date  . APPENDECTOMY  1990   Dr Lemar Livings  . COLONOSCOPY  2008   Dr Lemar Livings  . COLONOSCOPY WITH PROPOFOL N/A 08/13/2016   Procedure: COLONOSCOPY WITH PROPOFOL;  Surgeon: Earline Mayotte, MD;  Location: [facility] END[facility];  Service: Endoscopy;  Laterality: N/A;  . HEMORRHOID SURGERY    . HERNIA REPAIR  01/20/14   umbilical hernia  . KNEE ARTHROSCOPY Left   . KNEE SURGERY Left 1986  . TONSILLECTOMY    . VASECTOMY      Family History        Family Status  Relation Name Status  . Mother  Deceased at age 27  . Father  Deceased at age 39  . Sister  Alive  . Sister  Alive  . Oneal Grout  Deceased  . Sister  Alive  . Sister  Alive  . Oneal Grout  (Not Specified)  . Mat Uncle  (Not Specified)  . PGM  (Not Specified)  . Emelda Brothers  (Not Specified)        His family history includes Alcohol abuse in his paternal uncle; Asthma in his sister; Cancer in his father, maternal uncle, mother, paternal aunt, and paternal grandmother; Cancer - Prostate in his paternal uncle; Glaucoma in his mother; Hypertension in his sister; Liver disease in his paternal uncle.      Allergies  Allergen Reactions  . Hydralazine Hives    Chest tightness, severe headache, dyspnea  . Moxifloxacin Hives    Avelox  . Pseudoephedrine Other (See Comments)    Confusion, passed out, numbness  . Pseudoephedrine Hcl     Other reaction(s): Syncope  . Sulfa Antibiotics Hives  . Sulfonamide Derivatives Hives     Current Outpatient Medications:  .  albuterol (PROAIR HFA) 108 (90 Base) MCG/ACT inhaler, INHALE 1-2 PUFFS AS NEEDED EVERY 4 TO 6 HOURS AS NEEDED FOR COUGH/WHEEZE. MUST LAST 90 DAYS PER MD, Disp: , Rfl:  .  Azelastine HCl 137 MCG/SPRAY SOLN, Place 1 spray into the nose 2 (two) times daily., Disp: , Rfl:  .  budesonide-formoterol (SYMBICORT) 160-4.5 MCG/ACT inhaler, Inhale 2 puffs into the lungs 2 (two) times  daily., Disp: , Rfl:  .  calcium citrate (CALCITRATE - DOSED IN MG ELEMENTAL CALCIUM) 950 MG tablet, Take 200 mg of elemental calcium by mouth daily., Disp: , Rfl:  .  Cholecalciferol (D 2000) 2000 UNITS TABS, Take by mouth daily. , Disp: , Rfl:  .  DEXILANT 60 MG capsule, TAKE 1 CAPSULE EVERY DAY, Disp: 30 capsule, Rfl: 10 .  dextromethorphan-guaiFENesin (MUCINEX DM) 30-600 MG 12hr tablet, Take 1 tablet by mouth 2 (two) times daily as needed for cough., Disp: , Rfl:  .  dutasteride (  AVODART) 0.5 MG capsule, Take 1 capsule (0.5 mg total) by mouth daily., Disp: 30 capsule, Rfl: 11 .  EPIPEN 2-PAK 0.3 MG/0.3ML SOAJ injection, USE AS DIRECTED AS NEEDED, Disp: , Rfl: 1 .  fluticasone (FLONASE) 50 MCG/ACT nasal spray, , Disp: , Rfl: 2 .  fluticasone (FLONASE) 50 MCG/ACT nasal spray, Place 1 spray into both nostrils daily., Disp: , Rfl:  .  glucosamine-chondroitin 500-400 MG tablet, Take 1 tablet by mouth 2 (two) times daily., Disp: , Rfl:  .  hydrochlorothiazide (HYDRODIURIL) 25 MG tablet, TAKE 1 TABLET BY MOUTH EVERY DAY, Disp: 90 tablet, Rfl: 3 .  ketotifen (ALAWAY) 0.025 % ophthalmic solution, Place 1 drop into both eyes daily. 1 drop 2 (two) times daily., Disp: , Rfl:  .  losartan (COZAAR) 100 MG tablet, TAKE 1 TABLET BY MOUTH EVERY DAY, Disp: 90 tablet, Rfl: 3 .  Multiple Vitamin (MULTI-VITAMINS) TABS, Take by mouth daily. , Disp: , Rfl:  .  naproxen (NAPROSYN) 500 MG tablet, TAKE 1 TABLET BY MOUTH TWICE A DAY AS NEEDED FOR PAIN, Disp: 60 tablet, Rfl: 11 .  ranitidine (ZANTAC) 300 MG capsule, TAKE 1 CAPSULE (300 MG TOTAL) BY MOUTH EVERY EVENING., Disp: 90 capsule, Rfl: 3 .  tadalafil (CIALIS) 5 MG tablet, TAKE 1 TABLET BY MOUTH EVERY DAY AS NEEDED ERECTILE DYSFUNCTION, Disp: 30 tablet, Rfl: 3 .  tamsulosin (FLOMAX) 0.4 MG CAPS capsule, Take 1 capsule (0.4 mg total) by mouth daily., Disp: 30 capsule, Rfl: 11 .  naproxen (NAPROSYN) 500 MG tablet, TAKE 1 TABLET BY MOUTH TWICE A DAY AS NEEDED FOR PAIN  (Patient not taking: Reported on 07/09/2017), Disp: 60 tablet, Rfl: 12 .  Testosterone (AXIRON) 30 MG/ACT SOLN, One swipe per axilla daily, Disp: 60 mL, Rfl: 5 .  Turmeric 500 MG CAPS, Take 1 capsule by mouth daily., Disp: , Rfl:  .  verapamil (CALAN-SR) 240 MG CR tablet, TAKE 1 TABLET TWICE A DAY, Disp: 180 tablet, Rfl: 3   Patient Care Team: Maple Hudson., MD as PCP - General (Family Medicine) Maple Hudson., MD (Family Medicine) Lemar Livings, Merrily Pew, MD (General Surgery)      Objective:   Vitals: BP 128/74 (BP Location: Left Arm, Patient Position: Sitting, Cuff Size: Large)   Pulse 78   Temp 98.4 F (36.9 C)   Resp 16   Ht 6' (1.829 m)   Wt 217 lb (98.4 kg)   SpO2 98%   BMI 29.43 kg/m    Vitals:   12/23/17 0914  BP: 128/74  Pulse: 78  Resp: 16  Temp: 98.4 F (36.9 C)  SpO2: 98%  Weight: 217 lb (98.4 kg)  Height: 6' (1.829 m)     Physical Exam  Constitutional: He is oriented to person, place, and time. He appears well-developed and well-nourished.  Mild  obesity  HENT:  Head: Normocephalic and atraumatic.  Right Ear: External ear normal.  Left Ear: External ear normal.  Nose: Nose normal.  Mouth/Throat: Oropharynx is clear and moist.  Eyes: Pupils are equal, round, and reactive to light. Conjunctivae are normal. No scleral icterus.  Neck: No thyromegaly present.  Cardiovascular: Normal rate, regular rhythm, normal heart sounds and intact distal pulses.  Pulmonary/Chest: Effort normal and breath sounds normal.  Abdominal: Soft.  Genitourinary: Penis normal.  Musculoskeletal: He exhibits no edema.  Lymphadenopathy:    He has no cervical adenopathy.  Neurological: He is alert and oriented to person, place, and time.  Skin: Skin is warm and  dry.  Psychiatric: He has a normal mood and affect. His behavior is normal. Judgment and thought content normal.     Depression Screen PHQ 2/9 Scores 12/23/2017 03/16/2017 06/18/2016 11/07/2014  PHQ - 2 Score 0  0 0 0  PHQ- 9 Score - - 0 -      Assessment & Plan:     Routine Health Maintenance and Physical Exam  Exercise Activities and Dietary recommendations Goals   None     Immunization History  Administered Date(s) Administered  . H1N1 01/18/2008  . Influenza Split 01/12/2010  . Influenza,inj,Quad PF,6+ Mos 11/28/2014  . Influenza-Unspecified 12/01/2016, 12/01/2017  . Pneumococcal Polysaccharide-23 09/04/1998  . Td 11/15/2003  . Tdap 10/11/2013    Health Maintenance  Topic Date Due  . HIV Screening  11/12/1970  . TETANUS/TDAP  10/12/2023  . COLONOSCOPY  08/14/2026  . INFLUENZA VACCINE  Completed  . Hepatitis C Screening  Completed     Discussed health benefits of physical activity, and encouraged him to engage in regular exercise appropriate for his age and condition.  1. Annual physical exam   2. Essential (primary) hypertension  - CBC with Differential/Platelet - Comprehensive metabolic panel - Lipid panel - TSH  3. Chronic bronchitis, unspecified chronic bronchitis type (HCC)  - doxycycline (VIBRA-TABS) 100 MG tablet; Take 1 tablet (100 mg total) by mouth 2 (two) times daily for 5 days.  Dispense: 10 tablet; Refill: 0   I have done the exam and reviewed the chart and it is accurate to the best of my knowledge. Dentist has been used and  any errors in dictation or transcription are unintentional. Julieanne Manson M.D. [facility]   Megan Mans, MD  [facility]

## 2017-12-29 ENCOUNTER — Other Ambulatory Visit: Payer: BLUE CROSS/BLUE SHIELD

## 2017-12-29 DIAGNOSIS — E291 Testicular hypofunction: Secondary | ICD-10-CM

## 2017-12-29 DIAGNOSIS — Z125 Encounter for screening for malignant neoplasm of prostate: Secondary | ICD-10-CM

## 2017-12-30 ENCOUNTER — Telehealth: Payer: Self-pay | Admitting: Family Medicine

## 2017-12-30 LAB — HEMOGLOBIN AND HEMATOCRIT, BLOOD
HEMOGLOBIN: 14.3 g/dL (ref 13.0–17.7)
Hematocrit: 40.7 % (ref 37.5–51.0)

## 2017-12-30 LAB — TESTOSTERONE: TESTOSTERONE: 828 ng/dL (ref 264–916)

## 2017-12-30 LAB — PSA: PROSTATE SPECIFIC AG, SERUM: 0.5 ng/mL (ref 0.0–4.0)

## 2017-12-30 MED ORDER — FAMOTIDINE 40 MG PO TABS: 40.0000 mg | ORAL_TABLET | Freq: Every day | ORAL | 3 refills | Status: DC

## 2017-12-30 NOTE — Telephone Encounter (Signed)
Please advise 

## 2017-12-30 NOTE — Telephone Encounter (Signed)
Pt needing a replacement for ranitidine (ZANTAC) 300 MG capsule  Since it was taken off of the shelves. Pt is currently out as well.  Please send replacement Rx to:     [id number] [place], [place] - [address] [phone] (Phone) [phone] (Fax)    Thanks, [facility]

## 2017-12-30 NOTE — Telephone Encounter (Signed)
[facility] Patient advised.  

## 2017-12-30 NOTE — Telephone Encounter (Signed)
pepcid 40mg  to replace zantac.

## 2017-12-31 ENCOUNTER — Telehealth: Payer: Self-pay

## 2017-12-31 NOTE — Telephone Encounter (Signed)
-----   Message from Riki Altes, MD sent at 12/31/2017  8:36 AM EDT ----- T level was 828; PSA 0.5; hct nml F/U as sched

## 2018-01-04 ENCOUNTER — Other Ambulatory Visit: Payer: Self-pay

## 2018-01-04 DIAGNOSIS — Z0000 Encounter for general adult medical examination without abnormal findings: Secondary | ICD-10-CM

## 2018-01-04 LAB — CBC WITH DIFFERENTIAL/PLATELET

## 2018-01-04 LAB — COMPREHENSIVE METABOLIC PANEL

## 2018-01-04 LAB — LIPID PANEL

## 2018-01-04 LAB — SPECIMEN STATUS REPORT

## 2018-01-04 LAB — TSH: TSH: 1.4 u[IU]/mL (ref 0.450–4.500)

## 2018-01-04 NOTE — Progress Notes (Unsigned)
[facility] unable to run CBC with diff, CMP and Lipid panel according to [place] the specimen was too old to run; pt notified to return for labs; pt called and scheduled appt

## 2018-01-05 ENCOUNTER — Other Ambulatory Visit: Payer: BLUE CROSS/BLUE SHIELD

## 2018-01-05 DIAGNOSIS — Z0000 Encounter for general adult medical examination without abnormal findings: Secondary | ICD-10-CM

## 2018-01-06 LAB — CBC WITH DIFFERENTIAL/PLATELET
BASOS ABS: 0 10*3/uL (ref 0.0–0.2)
BASOS: 0 %
EOS (ABSOLUTE): 0 10*3/uL (ref 0.0–0.4)
Eos: 0 %
Hematocrit: 42.9 % (ref 37.5–51.0)
Hemoglobin: 14.7 g/dL (ref 13.0–17.7)
IMMATURE GRANS (ABS): 0.2 10*3/uL — AB (ref 0.0–0.1)
IMMATURE GRANULOCYTES: 2 %
LYMPHS: 14 %
Lymphocytes Absolute: 1.8 10*3/uL (ref 0.7–3.1)
MCH: 32.5 pg (ref 26.6–33.0)
MCHC: 34.3 g/dL (ref 31.5–35.7)
MCV: 95 fL (ref 79–97)
Monocytes Absolute: 0.9 10*3/uL (ref 0.1–0.9)
Monocytes: 7 %
Neutrophils Absolute: 10.4 10*3/uL — ABNORMAL HIGH (ref 1.4–7.0)
Neutrophils: 77 %
PLATELETS: 231 10*3/uL (ref 150–450)
RBC: 4.53 x10E6/uL (ref 4.14–5.80)
RDW: 12.2 % — ABNORMAL LOW (ref 12.3–15.4)
WBC: 13.3 10*3/uL — ABNORMAL HIGH (ref 3.4–10.8)

## 2018-01-06 LAB — COMPREHENSIVE METABOLIC PANEL
A/G RATIO: 1.7 (ref 1.2–2.2)
ALT: 18 IU/L (ref 0–44)
AST: 13 IU/L (ref 0–40)
Albumin: 3.8 g/dL (ref 3.6–4.8)
Alkaline Phosphatase: 61 IU/L (ref 39–117)
BILIRUBIN TOTAL: 0.4 mg/dL (ref 0.0–1.2)
BUN / CREAT RATIO: 20 (ref 10–24)
BUN: 19 mg/dL (ref 8–27)
CHLORIDE: 99 mmol/L (ref 96–106)
CO2: 25 mmol/L (ref 20–29)
Calcium: 8.9 mg/dL (ref 8.6–10.2)
Creatinine, Ser: 0.95 mg/dL (ref 0.76–1.27)
GFR calc non Af Amer: 85 mL/min/{1.73_m2} (ref >59)
GFR, EST AFRICAN AMERICAN: 99 mL/min/{1.73_m2} (ref >59)
GLOBULIN, TOTAL: 2.2 g/dL (ref 1.5–4.5)
Glucose: 106 mg/dL — ABNORMAL HIGH (ref 65–99)
Potassium: 3.5 mmol/L (ref 3.5–5.2)
Sodium: 136 mmol/L (ref 134–144)
TOTAL PROTEIN: 6 g/dL (ref 6.0–8.5)

## 2018-01-06 LAB — LIPID PANEL
CHOLESTEROL TOTAL: 171 mg/dL (ref 100–199)
Chol/HDL Ratio: 2.4 ratio (ref 0.0–5.0)
HDL: 70 mg/dL (ref >39)
LDL Calculated: 86 mg/dL (ref 0–99)
TRIGLYCERIDES: 74 mg/dL (ref 0–149)
VLDL Cholesterol Cal: 15 mg/dL (ref 5–40)

## 2018-01-12 ENCOUNTER — Telehealth: Payer: Self-pay

## 2018-01-12 DIAGNOSIS — D72829 Elevated white blood cell count, unspecified: Secondary | ICD-10-CM

## 2018-01-12 NOTE — Telephone Encounter (Signed)
-----   Message from Maple Hudson., MD sent at 01/12/2018 11:32 AM EST ----- Repeat CBC 1 month--mildly elevated WBC.

## 2018-01-12 NOTE — Telephone Encounter (Signed)
LVMTRC 

## 2018-01-13 NOTE — Telephone Encounter (Signed)
Patient is out of town. I advised him you would like for him to come back in a month but patient would like to come on Monday, 01/18/2018. Would this be fine?

## 2018-01-13 NOTE — Telephone Encounter (Signed)
Dr. Sullivan Lone, you're off on Monday (01/18/18). :-) Would you like to work the patient in another day?

## 2018-01-13 NOTE — Telephone Encounter (Signed)
ok 

## 2018-01-13 NOTE — Telephone Encounter (Signed)
I do not need to see him --just rpeat CBC--so later in  Month better unless he feels unwell.

## 2018-01-14 ENCOUNTER — Other Ambulatory Visit: Payer: Self-pay | Admitting: Family Medicine

## 2018-01-25 ENCOUNTER — Other Ambulatory Visit: Payer: Self-pay | Admitting: Family Medicine

## 2018-01-25 DIAGNOSIS — E291 Testicular hypofunction: Secondary | ICD-10-CM

## 2018-01-31 MED ORDER — TESTOSTERONE 30 MG/ACT TD SOLN: TRANSDERMAL | 5 refills | Status: DC

## 2018-02-01 ENCOUNTER — Other Ambulatory Visit: Payer: Self-pay

## 2018-02-02 ENCOUNTER — Other Ambulatory Visit: Payer: BLUE CROSS/BLUE SHIELD

## 2018-02-02 DIAGNOSIS — D72829 Elevated white blood cell count, unspecified: Secondary | ICD-10-CM

## 2018-02-03 ENCOUNTER — Telehealth: Payer: Self-pay

## 2018-02-03 LAB — CBC WITH DIFFERENTIAL/PLATELET
Basophils Absolute: 0 10*3/uL (ref 0.0–0.2)
Basos: 1 %
EOS (ABSOLUTE): 0.1 10*3/uL (ref 0.0–0.4)
EOS: 2 %
Hematocrit: 41.1 % (ref 37.5–51.0)
Hemoglobin: 13.9 g/dL (ref 13.0–17.7)
IMMATURE GRANS (ABS): 0 10*3/uL (ref 0.0–0.1)
IMMATURE GRANULOCYTES: 1 %
LYMPHS: 26 %
Lymphocytes Absolute: 1.6 10*3/uL (ref 0.7–3.1)
MCH: 31.8 pg (ref 26.6–33.0)
MCHC: 33.8 g/dL (ref 31.5–35.7)
MCV: 94 fL (ref 79–97)
Monocytes Absolute: 0.5 10*3/uL (ref 0.1–0.9)
Monocytes: 8 %
NEUTROS PCT: 62 %
Neutrophils Absolute: 3.9 10*3/uL (ref 1.4–7.0)
PLATELETS: 211 10*3/uL (ref 150–450)
RBC: 4.37 x10E6/uL (ref 4.14–5.80)
RDW: 13.1 % (ref 12.3–15.4)
WBC: 6.2 10*3/uL (ref 3.4–10.8)

## 2018-02-03 NOTE — Telephone Encounter (Signed)
-----   Message from Maple Hudsonichard L Gilbert Jr., MD sent at 02/03/2018  9:03 AM EST ----- WBC back to normal.

## 2018-02-03 NOTE — Telephone Encounter (Signed)
Patient was advised.  

## 2018-02-08 ENCOUNTER — Ambulatory Visit: Payer: Self-pay | Admitting: Internal Medicine

## 2018-02-16 ENCOUNTER — Encounter: Payer: Self-pay | Admitting: Internal Medicine

## 2018-02-16 ENCOUNTER — Ambulatory Visit: Payer: Self-pay | Admitting: Internal Medicine

## 2018-02-16 VITALS — BP 116/72 | HR 85 | Resp 16 | Ht 72.0 in | Wt 217.0 lb

## 2018-02-16 DIAGNOSIS — G4733 Obstructive sleep apnea (adult) (pediatric): Secondary | ICD-10-CM

## 2018-02-16 DIAGNOSIS — Z9989 Dependence on other enabling machines and devices: Secondary | ICD-10-CM

## 2018-02-16 DIAGNOSIS — J4520 Mild intermittent asthma, uncomplicated: Secondary | ICD-10-CM

## 2018-02-16 DIAGNOSIS — K219 Gastro-esophageal reflux disease without esophagitis: Secondary | ICD-10-CM

## 2018-02-16 NOTE — Patient Instructions (Signed)

## 2018-02-16 NOTE — Progress Notes (Signed)
[facility] [address] [place], [place] [address]  Pulmonary Sleep Medicine   Office Visit Note  Patient Name: [name] [name] DOB: [birth year] MRN [id number]  Date of Service: 02/16/2018  Complaints/HPI: Pt is here for follow-up on OSA and asthma.  Patient reports he has been using his CPAP faithfully every night.  He reports excellent relief of symptoms since beginning using his CPAP.  He denies any excessive daytime sleepiness or fatigue.  No headaches chest pain or sinus issues.  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  He replaces his mask seal and filter monthly.  He denies any issues with his asthma and has not needed his inhaler or been hospitalized recently.  ROS  General: (-) fever, (-) chills, (-) night sweats, (-) weakness Skin: (-) rashes, (-) itching,. Eyes: (-) visual changes, (-) redness, (-) itching. Nose and Sinuses: (-) nasal stuffiness or itchiness, (-) postnasal drip, (-) nosebleeds, (-) sinus trouble. Mouth and Throat: (-) sore throat, (-) hoarseness. Neck: (-) swollen glands, (-) enlarged thyroid, (-) neck pain. Respiratory: + cough, (-) bloody sputum, - shortness of breath, - wheezing. Cardiovascular: - ankle swelling, (-) chest pain. Lymphatic: (-) lymph node enlargement. Neurologic: (-) numbness, (-) tingling. Psychiatric: (-) anxiety, (-) depression   Current Medication: Outpatient Encounter Medications as of 02/16/2018  Medication Sig Note  . albuterol (PROAIR HFA) 108 (90 Base) MCG/ACT inhaler INHALE 1-2 PUFFS AS NEEDED EVERY 4 TO 6 HOURS AS NEEDED FOR COUGH/WHEEZE. MUST LAST 90 DAYS PER MD   . azelastine (ASTELIN) 0.1 % nasal spray Place into both nostrils 2 (two) times daily. Use in each nostril as directed   . Azelastine HCl 137 MCG/SPRAY SOLN Place 1 spray into the nose 2 (two) times daily.   . budesonide-formoterol (SYMBICORT) 160-4.5 MCG/ACT inhaler Inhale 2 puffs into the lungs 2 (two)  times daily.   . calcium citrate (CALCITRATE - DOSED IN MG ELEMENTAL CALCIUM) 950 MG tablet Take 200 mg of elemental calcium by mouth daily.   . Cholecalciferol (D 2000) 2000 UNITS TABS Take by mouth daily.  11/02/2013: Received from: [facility]  . DEXILANT 60 MG capsule TAKE 1 CAPSULE BY MOUTH EVERY DAY   . dextromethorphan-guaiFENesin (MUCINEX DM) 30-600 MG 12hr tablet Take 1 tablet by mouth 2 (two) times daily as needed for cough.   . dutasteride (AVODART) 0.5 MG capsule Take 1 capsule (0.5 mg total) by mouth daily.   Marland Kitchen EPIPEN 2-PAK 0.3 MG/0.3ML SOAJ injection USE AS DIRECTED AS NEEDED   . famotidine (PEPCID) 40 MG tablet Take 1 tablet (40 mg total) by mouth daily.   . fluticasone (FLONASE) 50 MCG/ACT nasal spray Place 1 spray into both nostrils daily.   Marland Kitchen glucosamine-chondroitin 500-400 MG tablet Take 1 tablet by mouth 2 (two) times daily.   . hydrochlorothiazide (HYDRODIURIL) 25 MG tablet TAKE 1 TABLET BY MOUTH EVERY DAY   . ketotifen (ALAWAY) 0.025 % ophthalmic solution Place 1 drop into both eyes daily. 1 drop 2 (two) times daily. 11/02/2013: Received from: [facility]  . losartan (COZAAR) 100 MG tablet TAKE 1 TABLET BY MOUTH EVERY DAY   . Multiple Vitamin (MULTI-VITAMINS) TABS Take by mouth daily.  11/02/2013: Received from: [facility]  . naproxen (NAPROSYN) 500 MG tablet TAKE 1 TABLET BY MOUTH TWICE A DAY AS NEEDED FOR PAIN   . tadalafil (CIALIS) 5 MG tablet TAKE 1 TABLET BY MOUTH EVERY DAY AS  NEEDED ERECTILE DYSFUNCTION   . tamsulosin (FLOMAX) 0.4 MG CAPS capsule Take 1 capsule (0.4 mg total) by mouth daily.   . Testosterone (AXIRON) 30 MG/ACT SOLN One swipe per axilla daily   . Turmeric 500 MG CAPS Take 1 capsule by mouth daily.   . verapamil (CALAN-SR) 240 MG CR tablet TAKE 1 TABLET TWICE A DAY   . [DISCONTINUED] fluticasone (FLONASE) 50 MCG/ACT nasal spray    . [DISCONTINUED] naproxen (NAPROSYN) 500 MG tablet TAKE 1 TABLET BY  MOUTH TWICE A DAY AS NEEDED FOR PAIN (Patient not taking: Reported on 02/16/2018)    No facility-administered encounter medications on file as of 02/16/2018.     Surgical History: Past Surgical History:  Procedure Laterality Date  . APPENDECTOMY  1990   Dr Lemar Livings  . COLONOSCOPY  2008   Dr Lemar Livings  . COLONOSCOPY WITH PROPOFOL N/A 08/13/2016   Procedure: COLONOSCOPY WITH PROPOFOL;  Surgeon: Earline Mayotte, MD;  Location: [facility] ENDOSCOPY;  Service: Endoscopy;  Laterality: N/A;  . HEMORRHOID SURGERY    . HERNIA REPAIR  01/20/14   umbilical hernia  . KNEE ARTHROSCOPY Left   . KNEE SURGERY Left 1986  . TONSILLECTOMY    . VASECTOMY      Medical History: Past Medical History:  Diagnosis Date  . Allergy   . Asthma   . BP (high blood pressure) 11/07/2014   Should stop combination of Tekturna (DRI)  and ARB   . Hypertension   . HYPERTENSION, SEVERE 03/15/2007   Qualifier: Diagnosis of  By: Vernie Murders    . Prostate enlargement   . Sleep apnea     Family History: Family History  Problem Relation Age of Onset  . Cancer Mother        ovarian  . Glaucoma Mother   . Cancer Father        prostate  . Hypertension Sister   . Asthma Sister   . Alcohol abuse Paternal Uncle   . Liver disease Paternal Uncle   . Cancer - Prostate Paternal Uncle   . Cancer Maternal Uncle        lung; two mat uncles  . Cancer Paternal Grandmother        breast  . Cancer Paternal Aunt        lung cancer in a smoker    Social History: Social History   Socioeconomic History  . Marital status: Married    Spouse name: Not on file  . Number of children: Not on file  . Years of education: Not on file  . Highest education level: Not on file  Occupational History  . Not on file  Social Needs  . Financial resource strain: Not on file  . Food insecurity:    Worry: Not on file    Inability: Not on file  . Transportation needs:    Medical: Not on file    Non-medical: Not on file  Tobacco Use   . Smoking status: Never Smoker  . Smokeless tobacco: Never Used  . Tobacco comment: Exposed to second hand smoke as a child  Substance and Sexual Activity  . Alcohol use: Yes    Alcohol/week: 0.0 standard drinks    Comment: occasionally  . Drug use: No  . Sexual activity: Not on file  Lifestyle  . Physical activity:    Days per week: Not on file    Minutes per session: Not on file  . Stress: Not on file  Relationships  . Social connections:  Talks on phone: Not on file    Gets together: Not on file    Attends religious service: Not on file    Active member of club or organization: Not on file    Attends meetings of clubs or organizations: Not on file    Relationship status: Not on file  . Intimate partner violence:    Fear of current or ex partner: Not on file    Emotionally abused: Not on file    Physically abused: Not on file    Forced sexual activity: Not on file  Other Topics Concern  . Not on file  Social History Narrative  . Not on file    Vital Signs: Blood pressure 116/72, pulse 85, resp. rate 16, height 6' (1.829 m), weight 217 lb (98.4 kg), SpO2 96 %.  Examination: General Appearance: The patient is well-developed, well-nourished, and in no distress. Skin: Gross inspection of skin unremarkable. Head: normocephalic, no gross deformities. Eyes: no gross deformities noted. ENT: ears appear grossly normal no exudates. Neck: Supple. No thyromegaly. No LAD. Respiratory: clear bilateraly. Cardiovascular: Normal S1 and S2 without murmur or rub. Extremities: No cyanosis. pulses are equal. Neurologic: Alert and oriented. No involuntary movements.  LABS: Recent Results (from the past 2160 hour(s))  Hemoglobin and Hematocrit, Blood     Status: None   Collection Time: 12/29/17  8:02 AM  Result Value Ref Range   Hemoglobin 14.3 13.0 - 17.7 g/dL   Hematocrit 16.1 09.6 - 51.0 %  Testosterone     Status: None   Collection Time: 12/29/17  8:02 AM  Result Value Ref  Range   Testosterone 828 264 - 916 ng/dL    Comment: Adult male reference interval is based on a population of healthy nonobese males (BMI <30) between 55 and 70 years old. Travison, et.al. JCEM [phone]. PMID: [id number].   PSA     Status: None   Collection Time: 12/29/17  8:02 AM  Result Value Ref Range   Prostate Specific Ag, Serum 0.5 0.0 - 4.0 ng/mL    Comment: Roche ECLIA methodology. According to the American Urological Association, Serum PSA should decrease and remain at undetectable levels after radical prostatectomy. The AUA defines biochemical recurrence as an initial PSA value 0.2 ng/mL or greater followed by a subsequent confirmatory PSA value 0.2 ng/mL or greater. Values obtained with different assay methods or kits cannot be used interchangeably. Results cannot be interpreted as absolute evidence of the presence or absence of malignant disease.   CBC with Differential/Platelet     Status: None   Collection Time: 12/29/17  8:02 AM  Result Value Ref Range   WBC CANCELED x10E3/uL    Comment: Testing not performed due to the age of this specimen.       Joen Laura was notified 01/04/2018.  Result canceled by the ancillary.    RBC CANCELED     Comment: Test not performed  Result canceled by the ancillary.    Hemoglobin CANCELED     Comment: Test not performed  Result canceled by the ancillary.    Hematocrit CANCELED     Comment: Test not performed  Result canceled by the ancillary.    Platelets CANCELED     Comment: Test not performed  Result canceled by the ancillary.    Neutrophils CANCELED     Comment: Test not performed  Result canceled by the ancillary.    Lymphs CANCELED     Comment: Test not performed  Result canceled by the  ancillary.    Monocytes CANCELED     Comment: Test not performed  Result canceled by the ancillary.    Eos CANCELED     Comment: Test not performed  Result canceled by the ancillary.    Lymphocytes  Absolute CANCELED     Comment: Test not performed  Result canceled by the ancillary.    EOS (ABSOLUTE) CANCELED     Comment: Test not performed  Result canceled by the ancillary.    Basophils Absolute CANCELED     Comment: Test not performed  Result canceled by the ancillary.   Comprehensive metabolic panel     Status: None   Collection Time: 12/29/17  8:02 AM  Result Value Ref Range   Glucose CANCELED mg/dL    Comment: Testing not performed due to the age of this specimen.       Joen LauraKathy Harrison was notified 01/04/2018.  Result canceled by the ancillary.    BUN CANCELED     Comment: Test not performed  Result canceled by the ancillary.    Creatinine, Ser CANCELED     Comment: Test not performed  Result canceled by the ancillary.    Sodium CANCELED     Comment: Test not performed  Result canceled by the ancillary.    Potassium CANCELED     Comment: Test not performed  Result canceled by the ancillary.    Chloride CANCELED     Comment: Test not performed  Result canceled by the ancillary.    CO2 CANCELED     Comment: Test not performed  Result canceled by the ancillary.    Calcium CANCELED     Comment: Test not performed  Result canceled by the ancillary.    Total Protein CANCELED     Comment: Test not performed  Result canceled by the ancillary.    Albumin CANCELED     Comment: Test not performed  Result canceled by the ancillary.    Bilirubin Total CANCELED     Comment: Test not performed  Result canceled by the ancillary.    Alkaline Phosphatase CANCELED     Comment: Test not performed  Result canceled by the ancillary.    AST CANCELED     Comment: Test not performed  Result canceled by the ancillary.    ALT CANCELED     Comment: Test not performed  Result canceled by the ancillary.   Lipid panel     Status: None   Collection Time: 12/29/17  8:02 AM  Result Value Ref Range   Cholesterol, Total CANCELED mg/dL    Comment:  Testing not performed due to the age of this specimen.       Joen LauraKathy Harrison was notified 01/04/2018.  Result canceled by the ancillary.    Triglycerides CANCELED     Comment: Test not performed  Result canceled by the ancillary.    HDL CANCELED     Comment: Test not performed  Result canceled by the ancillary.   TSH     Status: None   Collection Time: 12/29/17  8:02 AM  Result Value Ref Range   TSH 1.400 0.450 - 4.500 uIU/mL  Specimen status report     Status: None   Collection Time: 12/29/17  8:02 AM  Result Value Ref Range   specimen status report Comment     Comment: Written Authorization Written Authorization Written Authorization Received. Authorization received from [employer] 01-01-2018 Logged by [place]   Lipid panel     Status:  None   Collection Time: 01/05/18  7:47 AM  Result Value Ref Range   Cholesterol, Total 171 100 - 199 mg/dL   Triglycerides 74 0 - 149 mg/dL   HDL 70 >57 mg/dL   VLDL Cholesterol Cal 15 5 - 40 mg/dL   LDL Calculated 86 0 - 99 mg/dL   Chol/HDL Ratio 2.4 0.0 - 5.0 ratio    Comment:                                   T. Chol/HDL Ratio                                             Men  Women                               1/2 Avg.Risk  3.4    3.3                                   Avg.Risk  5.0    4.4                                2X Avg.Risk  9.6    7.1                                3X Avg.Risk 23.4   11.0   Comprehensive metabolic panel     Status: Abnormal   Collection Time: 01/05/18  7:47 AM  Result Value Ref Range   Glucose 106 (H) 65 - 99 mg/dL   BUN 19 8 - 27 mg/dL   Creatinine, Ser 8.46 0.76 - 1.27 mg/dL   GFR calc non Af Amer 85 >59 mL/min/1.73   GFR calc Af Amer 99 >59 mL/min/1.73   BUN/Creatinine Ratio 20 10 - 24   Sodium 136 134 - 144 mmol/L   Potassium 3.5 3.5 - 5.2 mmol/L   Chloride 99 96 - 106 mmol/L   CO2 25 20 - 29 mmol/L   Calcium 8.9 8.6 - 10.2 mg/dL   Total Protein 6.0 6.0 - 8.5 g/dL   Albumin  3.8 3.6 - 4.8 g/dL   Globulin, Total 2.2 1.5 - 4.5 g/dL   Albumin/Globulin Ratio 1.7 1.2 - 2.2   Bilirubin Total 0.4 0.0 - 1.2 mg/dL   Alkaline Phosphatase 61 39 - 117 IU/L   AST 13 0 - 40 IU/L   ALT 18 0 - 44 IU/L  CBC with Differential/Platelet     Status: Abnormal   Collection Time: 01/05/18  7:47 AM  Result Value Ref Range   WBC 13.3 (H) 3.4 - 10.8 x10E3/uL   RBC 4.53 4.14 - 5.80 x10E6/uL   Hemoglobin 14.7 13.0 - 17.7 g/dL   Hematocrit 96.2 95.2 - 51.0 %   MCV 95 79 - 97 fL   MCH 32.5 26.6 - 33.0 pg   MCHC 34.3 31.5 - 35.7 g/dL   RDW 84.1 (L) 32.4 - 40.1 %   Platelets 231 150 - 450 x10E3/uL   Neutrophils 77 Not Estab. %  Lymphs 14 Not Estab. %   Monocytes 7 Not Estab. %   Eos 0 Not Estab. %   Basos 0 Not Estab. %   Neutrophils Absolute 10.4 (H) 1.4 - 7.0 x10E3/uL   Lymphocytes Absolute 1.8 0.7 - 3.1 x10E3/uL   Monocytes Absolute 0.9 0.1 - 0.9 x10E3/uL   EOS (ABSOLUTE) 0.0 0.0 - 0.4 x10E3/uL   Basophils Absolute 0.0 0.0 - 0.2 x10E3/uL   Immature Granulocytes 2 Not Estab. %   Immature Grans (Abs) 0.2 (H) 0.0 - 0.1 x10E3/uL    Comment: (An elevated percentage of Immature Granulocytes has not been found to be clinically significant as a sole clinical predictor of disease. Does NOT include bands or blast cells.  Pregnancy associated physiological leukocytosis may also show increased immature granulocytes without clinical significance.)   CBC with Differential/Platelet     Status: None   Collection Time: 02/02/18  8:02 AM  Result Value Ref Range   WBC 6.2 3.4 - 10.8 x10E3/uL   RBC 4.37 4.14 - 5.80 x10E6/uL   Hemoglobin 13.9 13.0 - 17.7 g/dL   Hematocrit 44.0 10.2 - 51.0 %   MCV 94 79 - 97 fL   MCH 31.8 26.6 - 33.0 pg   MCHC 33.8 31.5 - 35.7 g/dL   RDW 72.5 36.6 - 44.0 %   Platelets 211 150 - 450 x10E3/uL   Neutrophils 62 Not Estab. %   Lymphs 26 Not Estab. %   Monocytes 8 Not Estab. %   Eos 2 Not Estab. %   Basos 1 Not Estab. %   Neutrophils Absolute 3.9 1.4 - 7.0  x10E3/uL   Lymphocytes Absolute 1.6 0.7 - 3.1 x10E3/uL   Monocytes Absolute 0.5 0.1 - 0.9 x10E3/uL   EOS (ABSOLUTE) 0.1 0.0 - 0.4 x10E3/uL   Basophils Absolute 0.0 0.0 - 0.2 x10E3/uL   Immature Granulocytes 1 Not Estab. %   Immature Grans (Abs) 0.0 0.0 - 0.1 x10E3/uL    Radiology: No results found.  No results found.  No results found.    Assessment and Plan: Patient Active Problem List   Diagnosis Date Noted  . Thrombosed external hemorrhoid 05/10/2015  . Allergic rhinitis 11/07/2014  . Arthralgia of hand 11/07/2014  . Airway hyperreactivity 11/07/2014  . Benign fibroma of prostate 11/07/2014  . Impotence of organic origin 11/07/2014  . Acid reflux 11/07/2014  . BP (high blood pressure) 11/07/2014  . 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 Obstructive apnea 11/07/2014  . Plantar fasciitis 11/07/2014  . 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 Testicular hypofunction 10/26/2013  . Cellulitis 10/26/2013  . Essential (primary) hypertension 10/26/2013  . Other specified counseling 10/26/2013  . Febrile 10/26/2013  . Pain in soft tissues of limb 10/26/2013  . Need for prophylactic vaccination with combined diphtheria-tetanus-pertussis (DTP) vaccine 10/26/2013  . Palpitations 10/26/2013  . Personal history of perinatal problems 10/26/2013  . Encounter for screening for other viral diseases 09/29/2012  . ASTHMATIC BRONCHITIS, ACUTE 03/25/2007  . HYPERTENSION, SEVERE 03/15/2007  . RHINITIS 03/15/2007  . Asthma 03/15/2007  . ACID REFLUX DISEASE 03/15/2007  . COUGH, CHRONIC 03/15/2007   1. OSA on CPAP Patient continue to use his CPAP nightly.  2. Gastroesophageal reflux disease without esophagitis Stable, continue current medications.  3. Mild intermittent asthma without complication Patient denies any issues with his asthma as of recent.  He continues to have rescue inhaler as needed.     General Counseling: I have discussed the findings of the  evaluation and examination with Cristal Deer.  I have also discussed any further diagnostic evaluation thatmay be needed or ordered today. Kaamil verbalizes understanding of the findings of todays visit. We also reviewed his medications today and discussed drug interactions and side effects including but not limited excessive drowsiness and altered mental states. We also discussed that there is always a risk not just to him but also people around him. he has been encouraged to call the office with any questions or concerns that should arise related to todays visit.    Time spent: 25 This patient was seen by Blima Ledger AGNP-C in Collaboration with Dr. Freda Munro as a part of collaborative care agreement.   I have personally obtained a history, examined the patient, evaluated laboratory and imaging results, formulated the assessment and plan and placed orders.    Yevonne Pax, MD [facility] Pulmonary and Critical Care Sleep medicine

## 2018-03-09 ENCOUNTER — Telehealth: Payer: Self-pay | Admitting: Urology

## 2018-03-09 DIAGNOSIS — N401 Enlarged prostate with lower urinary tract symptoms: Secondary | ICD-10-CM

## 2018-03-09 MED ORDER — TADALAFIL 5 MG PO TABS: ORAL_TABLET | ORAL | 3 refills | Status: DC

## 2018-03-09 NOTE — Telephone Encounter (Signed)
Patient called to request a refill on tadalafil to be sent to a different pharmacy.  He is now using the [facility] in [place].    Please advise.

## 2018-03-09 NOTE — Telephone Encounter (Signed)
RX sent per protocol

## 2018-03-14 ENCOUNTER — Other Ambulatory Visit: Payer: Self-pay | Admitting: Family Medicine

## 2018-03-31 ENCOUNTER — Other Ambulatory Visit: Payer: Self-pay | Admitting: Family Medicine

## 2018-03-31 NOTE — Telephone Encounter (Signed)
Pt needing a refill on:  verapamil (CALAN-SR) 240 MG CR tablet  Please fill at:  [facility] [place], [place] - [address] [phone] (Phone) [phone] (Fax)   Thanks, [place]

## 2018-04-01 MED ORDER — VERAPAMIL HCL ER 240 MG PO TBCR: 240.0000 mg | EXTENDED_RELEASE_TABLET | Freq: Two times a day (BID) | ORAL | 3 refills | Status: DC

## 2018-04-05 ENCOUNTER — Telehealth: Payer: Self-pay | Admitting: Urology

## 2018-04-05 NOTE — Telephone Encounter (Signed)
Pt has changed to managed prescriptions, so [employer] is requiring more information from our office before they will refill prescriptions.  (Cialis and Testosterone 30 mg)  Please give insurance company a call.  [phone], they are going to ask if he's getting blood work prior to testosterone.  They are saying he is getting Cialis too often.  Pt told them it was for BPH, but [employer] said it was listed for ED.

## 2018-04-07 NOTE — Telephone Encounter (Signed)
Called [employer] and they are not aware of patients stated issue with needing more information.  Patient should have refills on cialis and testosterone.  If medications need prior authorization patient should have pharmacy process medication and then send [place] the prior authorization request.  Left message informing patient of this.

## 2018-04-14 ENCOUNTER — Telehealth: Payer: Self-pay | Admitting: Urology

## 2018-04-14 DIAGNOSIS — E291 Testicular hypofunction: Secondary | ICD-10-CM

## 2018-04-14 MED ORDER — TESTOSTERONE 30 MG/ACT TD SOLN: TRANSDERMAL | 3 refills | Status: DC

## 2018-04-14 NOTE — Telephone Encounter (Signed)
Pt called and states that he needs prior auth for his Testosterone and also needs a new rx sent to [facility].

## 2018-04-14 NOTE — Telephone Encounter (Signed)
axiron refill please

## 2018-04-14 NOTE — Telephone Encounter (Signed)
Rx sent 

## 2018-04-16 ENCOUNTER — Other Ambulatory Visit: Payer: Self-pay

## 2018-04-16 DIAGNOSIS — E291 Testicular hypofunction: Secondary | ICD-10-CM

## 2018-04-16 NOTE — Telephone Encounter (Signed)
Refill Request.  

## 2018-04-19 NOTE — Telephone Encounter (Signed)
Pt called back again and states he still needs PA for his testosterone.

## 2018-04-20 NOTE — Telephone Encounter (Signed)
Patient notified PA has been completed waiting on fax back from [facility].

## 2018-04-22 NOTE — Telephone Encounter (Signed)
[employer] called the office and has denied the prior authorization for testosterone.  They need 2 labs with low testosterone results sent to them to approve.  The contact number for [employer] is [phone], ext. [phone].

## 2018-04-23 ENCOUNTER — Telehealth: Payer: Self-pay

## 2018-04-23 NOTE — Telephone Encounter (Signed)
Incoming DENIAL from insurance on Testosterone due to patient not having low testosterone levels on file. Letter placed on your desk. Please advise.

## 2018-04-24 ENCOUNTER — Other Ambulatory Visit: Payer: Self-pay | Admitting: Family Medicine

## 2018-04-26 NOTE — Telephone Encounter (Signed)
I printed what I had out of [facility] and gave it to [facility]

## 2018-04-26 NOTE — Telephone Encounter (Signed)
Pharmacy requesting refills. Thanks!  

## 2018-04-26 NOTE — Telephone Encounter (Signed)
Review message below. He doesn't have and low Testosterone in our records.

## 2018-04-26 NOTE — Telephone Encounter (Signed)
Testosterone levels in epic were drawn when he was already on testosterone replacement.  He will either have to come off testosterone for 6 weeks and then have repeat levels drawn or you can check with Marcelino Duster to see if she can find old office notes/labs from the previous electronic medical record and send those.

## 2018-04-27 NOTE — Telephone Encounter (Signed)
He will need to discontinue the Axiron and get a repeat testosterone level in 4 weeks.

## 2018-04-27 NOTE — Telephone Encounter (Signed)
Pt called office asking about testosterone that hasn't been approved yet through insurance.  He says he's been on it for 15 years, which is why his labs are normal.  He said now it's between [employer] and [employer].

## 2018-04-27 NOTE — Telephone Encounter (Signed)
I responded to a similar message from [name] [name].  He will need to stop the Axiron and have a repeat morning testosterone level in 1 month.

## 2018-04-27 NOTE — Telephone Encounter (Signed)
[facility] only showed normal Testosterone, please advise

## 2018-04-28 NOTE — Telephone Encounter (Signed)
Spoke to patient, he got HR involved with the insurance company and the issue should be corrected.

## 2018-05-03 NOTE — Telephone Encounter (Signed)
See telephone encounter from 04/14/2018. 

## 2018-06-24 ENCOUNTER — Other Ambulatory Visit: Payer: Self-pay | Admitting: Urology

## 2018-06-24 DIAGNOSIS — E291 Testicular hypofunction: Secondary | ICD-10-CM

## 2018-06-30 ENCOUNTER — Ambulatory Visit: Payer: Self-pay

## 2018-07-12 ENCOUNTER — Other Ambulatory Visit: Payer: BLUE CROSS/BLUE SHIELD

## 2018-07-12 ENCOUNTER — Other Ambulatory Visit: Payer: Self-pay

## 2018-07-12 DIAGNOSIS — E291 Testicular hypofunction: Secondary | ICD-10-CM

## 2018-07-12 MED ORDER — TAMSULOSIN HCL 0.4 MG PO CAPS: 0.4000 mg | ORAL_CAPSULE | Freq: Every day | ORAL | 0 refills | Status: DC

## 2018-07-13 LAB — TESTOSTERONE: Testosterone: 576 ng/dL (ref 264–916)

## 2018-07-13 LAB — HEMATOCRIT: Hematocrit: 39.3 % (ref 37.5–51.0)

## 2018-07-13 LAB — PSA: Prostate Specific Ag, Serum: 0.5 ng/mL (ref 0.0–4.0)

## 2018-07-15 ENCOUNTER — Telehealth (INDEPENDENT_AMBULATORY_CARE_PROVIDER_SITE_OTHER): Payer: BLUE CROSS/BLUE SHIELD | Admitting: Urology

## 2018-07-15 ENCOUNTER — Other Ambulatory Visit: Payer: Self-pay

## 2018-07-15 DIAGNOSIS — N401 Enlarged prostate with lower urinary tract symptoms: Secondary | ICD-10-CM | POA: Diagnosis not present

## 2018-07-15 DIAGNOSIS — E291 Testicular hypofunction: Secondary | ICD-10-CM

## 2018-07-15 DIAGNOSIS — N5201 Erectile dysfunction due to arterial insufficiency: Secondary | ICD-10-CM

## 2018-07-15 MED ORDER — TADALAFIL 5 MG PO TABS: ORAL_TABLET | ORAL | 3 refills | Status: DC

## 2018-07-15 NOTE — Progress Notes (Signed)
Virtual Visit via Video Note  I connected with [name] [name] on 07/15/18 at  2:30 PM EDT by a video enabled telemedicine application and verified that I am speaking with the correct person using two identifiers.  Location: Patient: Office Provider: My office [facility]   I discussed the limitations of evaluation and management by telemedicine and the availability of in person appointments. The patient expressed understanding and agreed to proceed.  History of Present Illness: 63 year old male presents for annual follow-up of BPH, hypogonadism and erectile dysfunction.  He last saw Dr. Sherryl Barters May 2019.  He remains on Axiron, dutasteride, tamsulosin and low-dose tadalafil.  He states he is doing well.  His voiding pattern is stable and not bothersome.  Denies dysuria, gross hematuria or flank/abdominal/pelvic/scrotal pain.  The daily tadalafil helps his voiding symptoms and ED.  Recent labs on 07/12/2018 remarkable for a testosterone level 576, PSA 0.5, hematocrit 39.3.   Observations/Objective: Alert, no acute distress  Assessment and Plan: 63 year old male with stable ED, hypogonadism and BPH.  Tadalafil was refilled.  He did not need refills on his other medications.  Follow Up Instructions: 1 year follow-up with PSA, testosterone and hematocrit.  He will see Dr. Sullivan Lone in 6 months and will have his DRE and interim blood work at that visit.   I discussed the assessment and treatment plan with the patient. The patient was provided an opportunity to ask questions and all were answered. The patient agreed with the plan and demonstrated an understanding of the instructions.   The patient was advised to call back or seek an in-person evaluation if the symptoms worsen or if the condition fails to improve as anticipated.  I provided 15 minutes of non-face-to-face time during this encounter.   Riki Altes, MD

## 2018-07-16 ENCOUNTER — Ambulatory Visit: Payer: BLUE CROSS/BLUE SHIELD | Admitting: Urology

## 2018-07-21 ENCOUNTER — Ambulatory Visit: Payer: Self-pay

## 2018-07-25 ENCOUNTER — Other Ambulatory Visit: Payer: Self-pay | Admitting: Family Medicine

## 2018-07-27 ENCOUNTER — Other Ambulatory Visit: Payer: Self-pay

## 2018-07-27 MED ORDER — DUTASTERIDE 0.5 MG PO CAPS: 0.5000 mg | ORAL_CAPSULE | Freq: Every day | ORAL | 11 refills | Status: DC

## 2018-08-09 ENCOUNTER — Other Ambulatory Visit: Payer: Self-pay | Admitting: *Deleted

## 2018-08-09 MED ORDER — TAMSULOSIN HCL 0.4 MG PO CAPS: 0.4000 mg | ORAL_CAPSULE | Freq: Every day | ORAL | 3 refills | Status: DC

## 2018-09-01 ENCOUNTER — Other Ambulatory Visit: Payer: Self-pay

## 2018-09-01 ENCOUNTER — Ambulatory Visit: Payer: BC Managed Care – PPO

## 2018-09-01 DIAGNOSIS — G4733 Obstructive sleep apnea (adult) (pediatric): Secondary | ICD-10-CM

## 2018-09-01 NOTE — Progress Notes (Signed)
His cpap started giving trouble. I exchanged his cpap, will see him in clinic at his normal visit. He will call with any other problems

## 2018-09-07 ENCOUNTER — Other Ambulatory Visit: Payer: Self-pay

## 2018-09-07 DIAGNOSIS — E291 Testicular hypofunction: Secondary | ICD-10-CM

## 2018-09-07 DIAGNOSIS — N401 Enlarged prostate with lower urinary tract symptoms: Secondary | ICD-10-CM

## 2018-09-07 DIAGNOSIS — J4530 Mild persistent asthma, uncomplicated: Secondary | ICD-10-CM

## 2018-09-11 LAB — CBC WITH DIFFERENTIAL/PLATELET
Basophils Absolute: 0 10*3/uL (ref 0.0–0.2)
Basos: 0 %
EOS (ABSOLUTE): 0.1 10*3/uL (ref 0.0–0.4)
Eos: 1 %
Hematocrit: 42.9 % (ref 37.5–51.0)
Hemoglobin: 14.6 g/dL (ref 13.0–17.7)
Immature Grans (Abs): 0 10*3/uL (ref 0.0–0.1)
Immature Granulocytes: 0 %
Lymphocytes Absolute: 1.5 10*3/uL (ref 0.7–3.1)
Lymphs: 25 %
MCH: 32.4 pg (ref 26.6–33.0)
MCHC: 34 g/dL (ref 31.5–35.7)
MCV: 95 fL (ref 79–97)
Monocytes Absolute: 0.4 10*3/uL (ref 0.1–0.9)
Monocytes: 8 %
Neutrophils Absolute: 3.8 10*3/uL (ref 1.4–7.0)
Neutrophils: 66 %
Platelets: 202 10*3/uL (ref 150–450)
RBC: 4.5 x10E6/uL (ref 4.14–5.80)
RDW: 12.2 % (ref 11.6–15.4)
WBC: 5.8 10*3/uL (ref 3.4–10.8)

## 2018-09-11 LAB — IMMUNOGLOBULINS A/E/G/M, SERUM
IgA/Immunoglobulin A, Serum: 163 mg/dL (ref 61–437)
IgE (Immunoglobulin E), Serum: 30 IU/mL (ref 6–495)
IgG (Immunoglobin G), Serum: 562 mg/dL — ABNORMAL LOW (ref 603–1613)
IgM (Immunoglobulin M), Srm: 87 mg/dL (ref 20–172)

## 2018-10-09 ENCOUNTER — Other Ambulatory Visit: Payer: Self-pay | Admitting: Family Medicine

## 2018-10-09 DIAGNOSIS — I10 Essential (primary) hypertension: Secondary | ICD-10-CM

## 2018-10-24 ENCOUNTER — Other Ambulatory Visit: Payer: Self-pay | Admitting: Family Medicine

## 2018-12-15 ENCOUNTER — Other Ambulatory Visit: Payer: Self-pay | Admitting: Urology

## 2018-12-15 DIAGNOSIS — N401 Enlarged prostate with lower urinary tract symptoms: Secondary | ICD-10-CM

## 2018-12-15 DIAGNOSIS — E291 Testicular hypofunction: Secondary | ICD-10-CM

## 2018-12-15 MED ORDER — TAMSULOSIN HCL 0.4 MG PO CAPS: 0.4000 mg | ORAL_CAPSULE | Freq: Every day | ORAL | 3 refills | Status: DC

## 2018-12-15 NOTE — Telephone Encounter (Signed)
Pt needs a refill for Testosterone and Tamsulosin sent to [facility] in [place].

## 2018-12-16 DIAGNOSIS — J4530 Mild persistent asthma, uncomplicated: Secondary | ICD-10-CM | POA: Insufficient documentation

## 2018-12-16 MED ORDER — TESTOSTERONE 30 MG/ACT TD SOLN: TRANSDERMAL | 3 refills | Status: DC

## 2018-12-16 NOTE — Telephone Encounter (Signed)
Refill request already sent to provider

## 2018-12-16 NOTE — Telephone Encounter (Signed)
Pt called and wants to know if he can get his testosterone called in.

## 2018-12-23 ENCOUNTER — Other Ambulatory Visit: Payer: Self-pay | Admitting: Family Medicine

## 2018-12-27 NOTE — Progress Notes (Signed)
Patient: [name] [name], Male    DOB: 11/16/1955, 63 y.o.   MRN: [id number] Visit Date: 12/29/2018  Today's Provider: Megan Mansichard Doloros Kwolek Jr, MD   Chief Complaint  Patient presents with  . Annual Exam   Subjective:     Annual physical exam [name] HawsChristopher D Levitt is a 63 y.o. male who presents today for health maintenance and complete physical. He feels fairly well. He reports exercising none. He reports he is sleeping well. Patient states his blood pressures at home runs 1 20-1 48 systolic. He wears a CPAP nightly and says that it helps.  He sees Dr. Lonna CobbStoioff for urology issues. Dr. [place] CallasSharma locally is referring [facility] who subsequently referred him to GI and ENT for bronchitis that he has had 4 times in 2020 with a chronic cough and some lung changes. Left knee issues followed by Dr. Joice LoftsPoggi. -----------------------------------------------------------   Review of Systems  Constitutional: Negative.   HENT: Negative.   Eyes: Negative.   Respiratory: Positive for apnea and cough.   Cardiovascular: Negative.   Gastrointestinal: Negative.   Endocrine: Negative.   Genitourinary: Negative.   Musculoskeletal: Positive for arthralgias.  Skin: Negative.   Allergic/Immunologic: Negative.   Psychiatric/Behavioral: Negative.     Social History      He  reports that he has never smoked. He has never used smokeless tobacco. He reports current alcohol use. He reports that he does not use drugs.       Social History   Socioeconomic History  . Marital status: Married    Spouse name: Not on file  . Number of children: Not on file  . Years of education: Not on file  . Highest education level: Not on file  Occupational History  . Not on file  Social Needs  . Financial resource strain: Not on file  . Food insecurity    Worry: Not on file    Inability: Not on file  . Transportation needs    Medical: Not on file    Non-medical: Not on file  Tobacco Use  .  Smoking status: Never Smoker  . Smokeless tobacco: Never Used  . Tobacco comment: Exposed to second hand smoke as a child  Substance and Sexual Activity  . Alcohol use: Yes    Alcohol/week: 0.0 standard drinks    Comment: occasionally  . Drug use: No  . Sexual activity: Not on file  Lifestyle  . Physical activity    Days per week: Not on file    Minutes per session: Not on file  . Stress: Not on file  Relationships  . Social Musicianconnections    Talks on phone: Not on file    Gets together: Not on file    Attends religious service: Not on file    Active member of club or organization: Not on file    Attends meetings of clubs or organizations: Not on file    Relationship status: Not on file  Other Topics Concern  . Not on file  Social History Narrative  . Not on file    Past Medical History:  Diagnosis Date  . Allergy   . Asthma   . BP (high blood pressure) 11/07/2014   Should stop combination of Tekturna (DRI)  and ARB   . Hypertension   . HYPERTENSION, SEVERE 03/15/2007   Qualifier: Diagnosis of  By: Vernie Murdersaskin, Leslie    . Prostate enlargement   . Sleep apnea  Patient Active Problem List   Diagnosis Date Noted  . Erectile dysfunction due to arterial insufficiency 07/15/2018  . Synovitis of elbow 08/17/2017  . Thrombosed external hemorrhoid 05/10/2015  . Allergic rhinitis 11/07/2014  . Arthralgia of hand 11/07/2014  . Airway hyperreactivity 11/07/2014  . Benign prostatic hyperplasia with lower urinary tract symptoms 11/07/2014  . Impotence of organic origin 11/07/2014  . Acid reflux 11/07/2014  . BP (high blood pressure) 11/07/2014  . Hypogonadism in male 11/07/2014  . Obstructive apnea 11/07/2014  . Plantar fasciitis 11/07/2014  . 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 Testicular hypofunction 10/26/2013  . Cellulitis 10/26/2013  . Essential (primary) hypertension 10/26/2013  . Other specified counseling 10/26/2013  . Febrile 10/26/2013   . Pain in soft tissues of limb 10/26/2013  . Need for prophylactic vaccination with combined diphtheria-tetanus-pertussis (DTP) vaccine 10/26/2013  . Palpitations 10/26/2013  . Personal history of perinatal problems 10/26/2013  . Encounter for screening for other viral diseases 09/29/2012  . ASTHMATIC BRONCHITIS, ACUTE 03/25/2007  . HYPERTENSION, SEVERE 03/15/2007  . RHINITIS 03/15/2007  . Asthma 03/15/2007  . ACID REFLUX DISEASE 03/15/2007  . COUGH, CHRONIC 03/15/2007    Past Surgical History:  Procedure Laterality Date  . APPENDECTOMY  1990   Dr Bary Castilla  . COLONOSCOPY  2008   Dr Bary Castilla  . COLONOSCOPY WITH PROPOFOL N/A 08/13/2016   Procedure: COLONOSCOPY WITH PROPOFOL;  Surgeon: Robert Bellow, MD;  Location: [facility] ENDOSCOPY;  Service: Endoscopy;  Laterality: N/A;  . HEMORRHOID SURGERY    . HERNIA REPAIR  05/39/76   umbilical hernia  . KNEE ARTHROSCOPY Left   . KNEE SURGERY Left 1986  . TONSILLECTOMY    . VASECTOMY      Family History        Family Status  Relation Name Status  . Mother  Deceased at age 22  . Father  Deceased at age 86  . Sister  Alive  . Sister  Alive  . Annamarie Major  Deceased  . Sister  Alive  . Sister  Alive  . Annamarie Major  (Not Specified)  . Mat Uncle  (Not Specified)  . PGM  (Not Specified)  . Ethlyn Daniels  (Not Specified)        His family history includes Alcohol abuse in his paternal uncle; Asthma in his sister; Cancer in his father, maternal uncle, mother, paternal aunt, and paternal grandmother; Cancer - Prostate in his paternal uncle; Glaucoma in his mother; Hypertension in his sister; Liver disease in his paternal uncle.      Allergies  Allergen Reactions  . Hydralazine Hives    Chest tightness, severe headache, dyspnea  . Moxifloxacin Hives    Avelox  . Pseudoephedrine Other (See Comments)    Confusion, passed out, numbness  . Pseudoephedrine Hcl     Other reaction(s): Syncope  . Sulfa Antibiotics Hives  . Sulfonamide Derivatives  Hives     Current Outpatient Medications:  .  albuterol (PROAIR HFA) 108 (90 Base) MCG/ACT inhaler, INHALE 1-2 PUFFS AS NEEDED EVERY 4 TO 6 HOURS AS NEEDED FOR COUGH/WHEEZE. MUST LAST 90 DAYS PER MD, Disp: , Rfl:  .  Azelastine HCl 137 MCG/SPRAY SOLN, Place 1 spray into the nose 2 (two) times daily., Disp: , Rfl:  .  budesonide-formoterol (SYMBICORT) 160-4.5 MCG/ACT inhaler, Inhale 2 puffs into the lungs 2 (two) times daily., Disp: , Rfl:  .  calcium citrate (CALCITRATE - DOSED IN MG ELEMENTAL CALCIUM) 950 MG tablet, Take 200  mg of elemental calcium by mouth daily., Disp: , Rfl:  .  Cholecalciferol (D 2000) 2000 UNITS TABS, Take by mouth daily. , Disp: , Rfl:  .  DEXILANT 60 MG capsule, TAKE 1 CAPSULE BY MOUTH EVERY DAY, Disp: 90 capsule, Rfl: 3 .  dextromethorphan-guaiFENesin (MUCINEX DM) 30-600 MG 12hr tablet, Take 1 tablet by mouth 2 (two) times daily as needed for cough., Disp: , Rfl:  .  dutasteride (AVODART) 0.5 MG capsule, Take 1 capsule (0.5 mg total) by mouth daily., Disp: 30 capsule, Rfl: 11 .  famotidine (PEPCID) 40 MG tablet, TAKE ONE TABLET BY MOUTH DAILY, Disp: 30 tablet, Rfl: 11 .  fluticasone (FLONASE) 50 MCG/ACT nasal spray, Place 1 spray into both nostrils daily., Disp: , Rfl:  .  glucosamine-chondroitin 500-400 MG tablet, Take 1 tablet by mouth 2 (two) times daily., Disp: , Rfl:  .  hydrochlorothiazide (HYDRODIURIL) 25 MG tablet, TAKE ONE TABLET BY MOUTH DAILY, Disp: 90 tablet, Rfl: 3 .  ketotifen (ALAWAY) 0.025 % ophthalmic solution, Place 1 drop into both eyes daily. 1 drop 2 (two) times daily., Disp: , Rfl:  .  losartan (COZAAR) 100 MG tablet, TAKE ONE TABLET BY MOUTH DAILY, Disp: 90 tablet, Rfl: 3 .  Multiple Vitamin (MULTI-VITAMINS) TABS, Take by mouth daily. , Disp: , Rfl:  .  naproxen (NAPROSYN) 500 MG tablet, TAKE ONE TABLET BY MOUTH TWICE A DAY AS NEEDED FOR PAIN, Disp: 60 tablet, Rfl: 5 .  tadalafil (CIALIS) 5 MG tablet, TAKE 1 TABLET BY MOUTH EVERY DAY AS NEEDED  ERECTILE DYSFUNCTION, Disp: 90 tablet, Rfl: 3 .  tamsulosin (FLOMAX) 0.4 MG CAPS capsule, Take 1 capsule (0.4 mg total) by mouth daily., Disp: 90 capsule, Rfl: 3 .  Testosterone (AXIRON) 30 MG/ACT SOLN, One swipe per axilla daily, Disp: 90 mL, Rfl: 3 .  Turmeric 500 MG CAPS, Take 1 capsule by mouth daily., Disp: , Rfl:  .  verapamil (CALAN-SR) 240 MG CR tablet, Take 1 tablet (240 mg total) by mouth 2 (two) times daily., Disp: 180 tablet, Rfl: 3 .  azelastine (ASTELIN) 0.1 % nasal spray, Place into both nostrils 2 (two) times daily. Use in each nostril as directed, Disp: , Rfl:  .  EPIPEN 2-PAK 0.3 MG/0.3ML SOAJ injection, USE AS DIRECTED AS NEEDED, Disp: , Rfl: 1   Patient Care Team: Maple Hudson., MD as PCP - General (Family Medicine) Maple Hudson., MD (Family Medicine) Lemar Livings, Merrily Pew, MD (General Surgery)    Objective:    Vitals: BP (!) 162/84 (BP Location: Right Arm, Patient Position: Sitting, Cuff Size: Large)   Pulse 78   Temp (!) 96.9 F (36.1 C) (Other (Comment))   Resp 16   Ht 6' (1.829 m)   Wt 222 lb (100.7 kg)   SpO2 97%   BMI 30.11 kg/m    Vitals:   12/29/18 0907  BP: (!) 162/84  Pulse: 78  Resp: 16  Temp: (!) 96.9 F (36.1 C)  TempSrc: Other (Comment)  SpO2: 97%  Weight: 222 lb (100.7 kg)  Height: 6' (1.829 m)     Physical Exam Vitals signs reviewed.  Constitutional:      Appearance: He is well-developed.     Comments: Mild  Obesity--trunkal.  HENT:     Head: Normocephalic and atraumatic.     Right Ear: External ear normal.     Left Ear: External ear normal.     Nose: Nose normal.  Eyes:     General: No  scleral icterus.    Conjunctiva/sclera: Conjunctivae normal.     Pupils: Pupils are equal, round, and reactive to light.  Neck:     Thyroid: No thyromegaly.  Cardiovascular:     Rate and Rhythm: Normal rate and regular rhythm.     Heart sounds: Normal heart sounds.  Pulmonary:     Effort: Pulmonary effort is normal.      Breath sounds: Rhonchi present.     Comments: Some mild rhonchi at the right lung base. Abdominal:     Palpations: Abdomen is soft.  Genitourinary:    Penis: Normal.   Lymphadenopathy:     Cervical: No cervical adenopathy.  Skin:    General: Skin is warm and dry.  Neurological:     Mental Status: He is alert and oriented to person, place, and time.  Psychiatric:        Behavior: Behavior normal.        Thought Content: Thought content normal.        Judgment: Judgment normal.      Depression Screen PHQ 2/9 Scores 12/29/2018 12/23/2017 03/16/2017 06/18/2016  PHQ - 2 Score 0 0 0 0  PHQ- 9 Score 0 - - 0       Assessment & Plan:     Routine Health Maintenance and Physical Exam  Exercise Activities and Dietary recommendations Goals   None     Immunization History  Administered Date(s) Administered  . H1N1 01/18/2008  . Influenza Split 01/12/2010  . Influenza,inj,Quad PF,6+ Mos 11/28/2014  . Influenza-Unspecified 12/01/2016, 12/01/2017  . Pneumococcal Polysaccharide-23 09/04/1998  . Td 11/15/2003  . Tdap 10/11/2013    Health Maintenance  Topic Date Due  . HIV Screening  11/12/1970  . TETANUS/TDAP  10/12/2023  . COLONOSCOPY  08/14/2026  . INFLUENZA VACCINE  Completed  . Hepatitis C Screening  Completed     Discussed health benefits of physical activity, and encouraged him to engage in regular exercise appropriate for his age and condition.     1. Annual physical exam  - CBC w/Diff/Platelet - TSH - Lipid panel - Comprehensive Metabolic Panel (CMET)  2. Essential (primary) hypertension  - CBC w/Diff/Platelet - TSH - Lipid panel - Comprehensive Metabolic Panel (CMET)  3. Obstructive apnea He wears CPAP nightly. - CBC w/Diff/Platelet - TSH - Lipid panel - Comprehensive Metabolic Panel (CMET)  4. Gastroesophageal reflux disease, unspecified whether esophagitis present  - CBC w/Diff/Platelet - TSH - Lipid panel - Comprehensive Metabolic Panel  (CMET)  5. Encounter for screening fecal occult blood testing Negative  - IFOBT POC (occult bld, rslt in office) 6.Knee pain Per Dr Joice Lofts. Follow up in one year for CPE.  7.  Chronic bronchitis Work-up underway at [facility] by pulmonary GI and ENT.  I,April Miller,acting as a scribe for Megan Mans, MD.,have documented all relevant documentation on the behalf of Megan Mans, MD,as directed by  Megan Mans, MD while in the presence of Megan Mans, MD.   Megan Mans, MD  [facility]

## 2018-12-29 ENCOUNTER — Encounter: Payer: Self-pay | Admitting: Family Medicine

## 2018-12-29 ENCOUNTER — Ambulatory Visit (INDEPENDENT_AMBULATORY_CARE_PROVIDER_SITE_OTHER): Payer: BC Managed Care – PPO | Admitting: Family Medicine

## 2018-12-29 ENCOUNTER — Other Ambulatory Visit: Payer: Self-pay

## 2018-12-29 VITALS — BP 162/84 | HR 78 | Temp 96.9°F | Resp 16 | Ht 72.0 in | Wt 222.0 lb

## 2018-12-29 DIAGNOSIS — I10 Essential (primary) hypertension: Secondary | ICD-10-CM

## 2018-12-29 DIAGNOSIS — K219 Gastro-esophageal reflux disease without esophagitis: Secondary | ICD-10-CM

## 2018-12-29 DIAGNOSIS — G4733 Obstructive sleep apnea (adult) (pediatric): Secondary | ICD-10-CM

## 2018-12-29 DIAGNOSIS — J42 Unspecified chronic bronchitis: Secondary | ICD-10-CM

## 2018-12-29 DIAGNOSIS — Z0000 Encounter for general adult medical examination without abnormal findings: Secondary | ICD-10-CM | POA: Diagnosis not present

## 2018-12-29 DIAGNOSIS — Z1211 Encounter for screening for malignant neoplasm of colon: Secondary | ICD-10-CM

## 2018-12-29 LAB — IFOBT (OCCULT BLOOD): IFOBT: NEGATIVE

## 2019-01-04 ENCOUNTER — Other Ambulatory Visit: Payer: Self-pay

## 2019-01-04 DIAGNOSIS — G4733 Obstructive sleep apnea (adult) (pediatric): Secondary | ICD-10-CM

## 2019-01-04 DIAGNOSIS — Z0000 Encounter for general adult medical examination without abnormal findings: Secondary | ICD-10-CM

## 2019-01-04 DIAGNOSIS — I10 Essential (primary) hypertension: Secondary | ICD-10-CM

## 2019-01-04 DIAGNOSIS — K219 Gastro-esophageal reflux disease without esophagitis: Secondary | ICD-10-CM

## 2019-01-05 LAB — COMPREHENSIVE METABOLIC PANEL
ALT: 23 IU/L (ref 0–44)
AST: 23 IU/L (ref 0–40)
Albumin/Globulin Ratio: 2.3 — ABNORMAL HIGH (ref 1.2–2.2)
Albumin: 4.2 g/dL (ref 3.8–4.8)
Alkaline Phosphatase: 72 IU/L (ref 39–117)
BUN/Creatinine Ratio: 15 (ref 10–24)
BUN: 16 mg/dL (ref 8–27)
Bilirubin Total: 0.6 mg/dL (ref 0.0–1.2)
CO2: 20 mmol/L (ref 20–29)
Calcium: 8.8 mg/dL (ref 8.6–10.2)
Chloride: 104 mmol/L (ref 96–106)
Creatinine, Ser: 1.08 mg/dL (ref 0.76–1.27)
GFR calc Af Amer: 84 mL/min/{1.73_m2} (ref >59)
GFR calc non Af Amer: 73 mL/min/{1.73_m2} (ref >59)
Globulin, Total: 1.8 g/dL (ref 1.5–4.5)
Glucose: 90 mg/dL (ref 65–99)
Potassium: 3.6 mmol/L (ref 3.5–5.2)
Sodium: 141 mmol/L (ref 134–144)
Total Protein: 6 g/dL (ref 6.0–8.5)

## 2019-01-05 LAB — CBC WITH DIFFERENTIAL/PLATELET
Basophils Absolute: 0 10*3/uL (ref 0.0–0.2)
Basos: 1 %
EOS (ABSOLUTE): 0.1 10*3/uL (ref 0.0–0.4)
Eos: 1 %
Hematocrit: 42.8 % (ref 37.5–51.0)
Hemoglobin: 14.7 g/dL (ref 13.0–17.7)
Immature Grans (Abs): 0 10*3/uL (ref 0.0–0.1)
Immature Granulocytes: 0 %
Lymphocytes Absolute: 1.7 10*3/uL (ref 0.7–3.1)
Lymphs: 29 %
MCH: 32.1 pg (ref 26.6–33.0)
MCHC: 34.3 g/dL (ref 31.5–35.7)
MCV: 93 fL (ref 79–97)
Monocytes Absolute: 0.5 10*3/uL (ref 0.1–0.9)
Monocytes: 8 %
Neutrophils Absolute: 3.7 10*3/uL (ref 1.4–7.0)
Neutrophils: 61 %
Platelets: 203 10*3/uL (ref 150–450)
RBC: 4.58 x10E6/uL (ref 4.14–5.80)
RDW: 12.8 % (ref 11.6–15.4)
WBC: 6 10*3/uL (ref 3.4–10.8)

## 2019-01-05 LAB — LIPID PANEL
Chol/HDL Ratio: 3.5 ratio (ref 0.0–5.0)
Cholesterol, Total: 164 mg/dL (ref 100–199)
HDL: 47 mg/dL (ref >39)
LDL Chol Calc (NIH): 98 mg/dL (ref 0–99)
Triglycerides: 104 mg/dL (ref 0–149)
VLDL Cholesterol Cal: 19 mg/dL (ref 5–40)

## 2019-01-05 LAB — TSH: TSH: 1.09 u[IU]/mL (ref 0.450–4.500)

## 2019-01-14 ENCOUNTER — Other Ambulatory Visit: Payer: Self-pay | Admitting: Family Medicine

## 2019-01-14 DIAGNOSIS — K219 Gastro-esophageal reflux disease without esophagitis: Secondary | ICD-10-CM

## 2019-02-22 ENCOUNTER — Telehealth: Payer: Self-pay

## 2019-02-22 NOTE — Telephone Encounter (Signed)
Called confirmed appointment with patient. klh 

## 2019-02-24 ENCOUNTER — Other Ambulatory Visit: Payer: Self-pay

## 2019-02-24 ENCOUNTER — Ambulatory Visit (INDEPENDENT_AMBULATORY_CARE_PROVIDER_SITE_OTHER): Payer: BC Managed Care – PPO | Admitting: Internal Medicine

## 2019-02-24 ENCOUNTER — Encounter: Payer: Self-pay | Admitting: Internal Medicine

## 2019-02-24 VITALS — BP 141/75 | HR 87 | Temp 97.3°F | Resp 16 | Ht 72.0 in | Wt 214.0 lb

## 2019-02-24 DIAGNOSIS — J410 Simple chronic bronchitis: Secondary | ICD-10-CM | POA: Diagnosis not present

## 2019-02-24 DIAGNOSIS — K219 Gastro-esophageal reflux disease without esophagitis: Secondary | ICD-10-CM

## 2019-02-24 DIAGNOSIS — J4520 Mild intermittent asthma, uncomplicated: Secondary | ICD-10-CM | POA: Diagnosis not present

## 2019-02-24 DIAGNOSIS — G4733 Obstructive sleep apnea (adult) (pediatric): Secondary | ICD-10-CM

## 2019-02-24 DIAGNOSIS — Z9989 Dependence on other enabling machines and devices: Secondary | ICD-10-CM

## 2019-02-24 NOTE — Progress Notes (Signed)
[facility] [address] [place], [place] [address]  Pulmonary Sleep Medicine   Office Visit Note  Patient Name: [name] [name] DOB: [birth year] MRN [id number]  Date of Service: 02/24/2019  Complaints/HPI: Pt is here for follow up on osa.  He reports he has been having some chronic bronchitis.  He has been seeing allergy and asthma specialist for this.  He has now been seeing a specialist at [facility].  He has been doing multiple tests, and even had nasal surgery 3 days ago.  He had turbinate reduction and septal shaving.  So he is not able to wear his cpap for the next week.   ROS  General: (-) fever, (-) chills, (-) night sweats, (-) weakness Skin: (-) rashes, (-) itching,. Eyes: (-) visual changes, (-) redness, (-) itching. Nose and Sinuses: (-) nasal stuffiness or itchiness, (-) postnasal drip, (-) nosebleeds, (-) sinus trouble. Mouth and Throat: (-) sore throat, (-) hoarseness. Neck: (-) swollen glands, (-) enlarged thyroid, (-) neck pain. Respiratory: - cough, (-) bloody sputum, - shortness of breath, - wheezing. Cardiovascular: - ankle swelling, (-) chest pain. Lymphatic: (-) lymph node enlargement. Neurologic: (-) numbness, (-) tingling. Psychiatric: (-) anxiety, (-) depression   Current Medication: Outpatient Encounter Medications as of 02/24/2019  Medication Sig Note  . albuterol (PROAIR HFA) 108 (90 Base) MCG/ACT inhaler INHALE 1-2 PUFFS AS NEEDED EVERY 4 TO 6 HOURS AS NEEDED FOR COUGH/WHEEZE. MUST LAST 90 DAYS PER MD   . Melene Muller ON 02/25/2019] Azelastine HCl 0.15 % SOLN 1-2 sprays in each nostril twice daily   . Azelastine HCl 137 MCG/SPRAY SOLN Place 1 spray into the nose 2 (two) times daily.   . budesonide (PULMICORT) 1 MG/2ML nebulizer solution Inhale into the lungs.   . budesonide-formoterol (SYMBICORT) 160-4.5 MCG/ACT inhaler Inhale 2 puffs into the lungs 2 (two) times daily.   . calcium citrate (CALCITRATE - DOSED IN MG ELEMENTAL CALCIUM) 950 MG  tablet Take 200 mg of elemental calcium by mouth daily.   . cephALEXin (KEFLEX) 250 MG capsule Take by mouth.   . Cholecalciferol (D 2000) 2000 UNITS TABS Take by mouth daily.  11/02/2013: Received from: [facility]  . DEXILANT 60 MG capsule TAKE ONE CAPSULE BY MOUTH DAILY   . dextromethorphan-guaiFENesin (MUCINEX DM) 30-600 MG 12hr tablet Take 1 tablet by mouth 2 (two) times daily as needed for cough.   . dutasteride (AVODART) 0.5 MG capsule Take 1 capsule (0.5 mg total) by mouth daily.   . famotidine (PEPCID) 40 MG tablet TAKE ONE TABLET BY MOUTH DAILY   . fluticasone (FLONASE) 50 MCG/ACT nasal spray Place 1 spray into both nostrils daily.   Marland Kitchen glucosamine-chondroitin 500-400 MG tablet Take 1 tablet by mouth 2 (two) times daily.   . hydrochlorothiazide (HYDRODIURIL) 25 MG tablet TAKE ONE TABLET BY MOUTH DAILY   . ketotifen (ALAWAY) 0.025 % ophthalmic solution Place 1 drop into both eyes daily. 1 drop 2 (two) times daily. 11/02/2013: Received from: [facility]  . losartan (COZAAR) 100 MG tablet TAKE ONE TABLET BY MOUTH DAILY   . Multiple Vitamin (MULTI-VITAMINS) TABS Take by mouth daily.  11/02/2013: [facility]  . mupirocin ointment (BACTROBAN) 2 % Apply topically.   . naproxen (NAPROSYN) 500 MG tablet TAKE ONE TABLET BY MOUTH TWICE A DAY AS NEEDED FOR PAIN   . QNASL 80 MCG/ACT AERS    . tadalafil (CIALIS) 5 MG tablet TAKE 1 TABLET BY MOUTH EVERY DAY  AS NEEDED ERECTILE DYSFUNCTION   . tamsulosin (FLOMAX) 0.4 MG CAPS capsule Take 1 capsule (0.4 mg total) by mouth daily.   . Testosterone (AXIRON) 30 MG/ACT SOLN One swipe per axilla daily   . Turmeric 500 MG CAPS Take 1 capsule by mouth daily.   . verapamil (CALAN-SR) 240 MG CR tablet Take 1 tablet (240 mg total) by mouth 2 (two) times daily.   Marland Kitchen azelastine (ASTELIN) 0.1 % nasal spray Place into both nostrils 2 (two) times daily. Use in each nostril as directed   . EPIPEN 2-PAK 0.3  MG/0.3ML SOAJ injection USE AS DIRECTED AS NEEDED    No facility-administered encounter medications on file as of 02/24/2019.    Surgical History: Past Surgical History:  Procedure Laterality Date  . APPENDECTOMY  1990   Dr Lemar Livings  . COLONOSCOPY  2008   Dr Lemar Livings  . COLONOSCOPY WITH PROPOFOL N/A 08/13/2016   Procedure: COLONOSCOPY WITH PROPOFOL;  Surgeon: Earline Mayotte, MD;  Location: [facility] ENDOSCOPY;  Service: Endoscopy;  Laterality: N/A;  . HEMORRHOID SURGERY    . HERNIA REPAIR  01/20/14   umbilical hernia  . KNEE ARTHROSCOPY Left   . KNEE SURGERY Left 1986  . NASAL SEPTUM SURGERY    . TONSILLECTOMY    . VASECTOMY      Medical History: Past Medical History:  Diagnosis Date  . Allergy   . Asthma   . BP (high blood pressure) 11/07/2014   Should stop combination of Tekturna (DRI)  and ARB   . Hypertension   . HYPERTENSION, SEVERE 03/15/2007   Qualifier: Diagnosis of  By: Vernie Murders    . Prostate enlargement   . Sleep apnea     Family History: Family History  Problem Relation Age of Onset  . Cancer Mother        ovarian  . Glaucoma Mother   . Cancer Father        prostate  . Hypertension Sister   . Asthma Sister   . Alcohol abuse Paternal Uncle   . Liver disease Paternal Uncle   . Cancer - Prostate Paternal Uncle   . Cancer Maternal Uncle        lung; two mat uncles  . Cancer Paternal Grandmother        breast  . Cancer Paternal Aunt        lung cancer in a smoker    Social History: Social History   Socioeconomic History  . Marital status: Married    Spouse name: Not on file  . Number of children: Not on file  . Years of education: Not on file  . Highest education level: Not on file  Occupational History  . Not on file  Tobacco Use  . Smoking status: Never Smoker  . Smokeless tobacco: Never Used  . Tobacco comment: Exposed to second hand smoke as a child  Substance and Sexual Activity  . Alcohol use: Yes    Alcohol/week: 0.0 standard  drinks    Comment: occasionally  . Drug use: No  . Sexual activity: Not on file  Other Topics Concern  . Not on file  Social History Narrative  . Not on file   Social Determinants of Health   Financial Resource Strain:   . Difficulty of Paying Living Expenses: Not on file  Food Insecurity:   . Worried About Programme researcher, broadcasting/film/video in the Last Year: Not on file  . Ran Out of Food in the Last Year: Not on  file  Transportation Needs:   . Freight forwarderLack of Transportation (Medical): Not on file  . Lack of Transportation (Non-Medical): Not on file  Physical Activity:   . Days of Exercise per Week: Not on file  . Minutes of Exercise per Session: Not on file  Stress:   . Feeling of Stress : Not on file  Social Connections:   . Frequency of Communication with Friends and Family: Not on file  . Frequency of Social Gatherings with Friends and Family: Not on file  . Attends Religious Services: Not on file  . Active Member of Clubs or Organizations: Not on file  . Attends BankerClub or Organization Meetings: Not on file  . Marital Status: Not on file  Intimate Partner Violence:   . Fear of Current or Ex-Partner: Not on file  . Emotionally Abused: Not on file  . Physically Abused: Not on file  . Sexually Abused: Not on file    Vital Signs: Blood pressure (!) 141/75, pulse 87, temperature (!) 97.3 F (36.3 C), resp. rate 16, height 6' (1.829 m), weight 214 lb (97.1 kg), SpO2 95 %.  Examination: General Appearance: The patient is well-developed, well-nourished, and in no distress. Skin: Gross inspection of skin unremarkable. Head: normocephalic, no gross deformities. Eyes: no gross deformities noted. ENT: ears appear grossly normal no exudates. Neck: Supple. No thyromegaly. No LAD. Respiratory: clear bilateraly. Cardiovascular: Normal S1 and S2 without murmur or rub. Extremities: No cyanosis. pulses are equal. Neurologic: Alert and oriented. No involuntary movements.  LABS: Recent Results (from  the past 2160 hour(s))  IFOBT POC (occult bld, rslt in office)     Status: Normal   Collection Time: 12/29/18  9:55 AM  Result Value Ref Range   IFOBT Negative   Lipid panel     Status: None   Collection Time: 01/04/19  8:24 AM  Result Value Ref Range   Cholesterol, Total 164 100 - 199 mg/dL   Triglycerides 308104 0 - 149 mg/dL   HDL 47 >65>39 mg/dL   VLDL Cholesterol Cal 19 5 - 40 mg/dL   LDL Chol Calc (NIH) 98 0 - 99 mg/dL   Chol/HDL Ratio 3.5 0.0 - 5.0 ratio    Comment:                                   T. Chol/HDL Ratio                                             Men  Women                               1/2 Avg.Risk  3.4    3.3                                   Avg.Risk  5.0    4.4                                2X Avg.Risk  9.6    7.1  3X Avg.Risk 23.4   11.0   TSH     Status: None   Collection Time: 01/04/19  8:24 AM  Result Value Ref Range   TSH 1.090 0.450 - 4.500 uIU/mL  Comprehensive metabolic panel     Status: Abnormal   Collection Time: 01/04/19  8:24 AM  Result Value Ref Range   Glucose 90 65 - 99 mg/dL   BUN 16 8 - 27 mg/dL   Creatinine, Ser 1.61 0.76 - 1.27 mg/dL   GFR calc non Af Amer 73 >59 mL/min/1.73   GFR calc Af Amer 84 >59 mL/min/1.73   BUN/Creatinine Ratio 15 10 - 24   Sodium 141 134 - 144 mmol/L   Potassium 3.6 3.5 - 5.2 mmol/L   Chloride 104 96 - 106 mmol/L   CO2 20 20 - 29 mmol/L   Calcium 8.8 8.6 - 10.2 mg/dL   Total Protein 6.0 6.0 - 8.5 g/dL   Albumin 4.2 3.8 - 4.8 g/dL   Globulin, Total 1.8 1.5 - 4.5 g/dL   Albumin/Globulin Ratio 2.3 (H) 1.2 - 2.2   Bilirubin Total 0.6 0.0 - 1.2 mg/dL   Alkaline Phosphatase 72 39 - 117 IU/L   AST 23 0 - 40 IU/L   ALT 23 0 - 44 IU/L  CBC with Differential/Platelet     Status: None   Collection Time: 01/04/19  8:24 AM  Result Value Ref Range   WBC 6.0 3.4 - 10.8 x10E3/uL   RBC 4.58 4.14 - 5.80 x10E6/uL   Hemoglobin 14.7 13.0 - 17.7 g/dL   Hematocrit 09.6 04.5 - 51.0 %   MCV 93  79 - 97 fL   MCH 32.1 26.6 - 33.0 pg   MCHC 34.3 31.5 - 35.7 g/dL   RDW 40.9 81.1 - 91.4 %   Platelets 203 150 - 450 x10E3/uL   Neutrophils 61 Not Estab. %   Lymphs 29 Not Estab. %   Monocytes 8 Not Estab. %   Eos 1 Not Estab. %   Basos 1 Not Estab. %   Neutrophils Absolute 3.7 1.4 - 7.0 x10E3/uL   Lymphocytes Absolute 1.7 0.7 - 3.1 x10E3/uL   Monocytes Absolute 0.5 0.1 - 0.9 x10E3/uL   EOS (ABSOLUTE) 0.1 0.0 - 0.4 x10E3/uL   Basophils Absolute 0.0 0.0 - 0.2 x10E3/uL   Immature Granulocytes 0 Not Estab. %   Immature Grans (Abs) 0.0 0.0 - 0.1 x10E3/uL    Radiology: No results found.  No results found.  No results found.    Assessment and Plan: Patient Active Problem List   Diagnosis Date Noted  . Erectile dysfunction due to arterial insufficiency 07/15/2018  . Synovitis of elbow 08/17/2017  . Thrombosed external hemorrhoid 05/10/2015  . Allergic rhinitis 11/07/2014  . Arthralgia of hand 11/07/2014  . Airway hyperreactivity 11/07/2014  . Benign prostatic hyperplasia with lower urinary tract symptoms 11/07/2014  . Impotence of organic origin 11/07/2014  . Acid reflux 11/07/2014  . BP (high blood pressure) 11/07/2014  . Hypogonadism in male 11/07/2014  . Obstructive apnea 11/07/2014  . Plantar fasciitis 11/07/2014  . 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 Testicular hypofunction 10/26/2013  . Cellulitis 10/26/2013  . Essential (primary) hypertension 10/26/2013  . Other specified counseling 10/26/2013  . Febrile 10/26/2013  . Pain in soft tissues of limb 10/26/2013  . Need for prophylactic vaccination with combined diphtheria-tetanus-pertussis (DTP) vaccine 10/26/2013  . Palpitations 10/26/2013  . Personal history of perinatal problems 10/26/2013  . Encounter for screening for  other viral diseases 09/29/2012  . ASTHMATIC BRONCHITIS, ACUTE 03/25/2007  . HYPERTENSION, SEVERE 03/15/2007  . RHINITIS 03/15/2007  . Asthma 03/15/2007  . ACID  REFLUX DISEASE 03/15/2007  . COUGH, CHRONIC 03/15/2007    1. OSA on CPAP Continue cpap therapy when cleared by surgery.   2. Gastroesophageal reflux disease without esophagitis Some symptoms, continue present management.  Pt has GI appt for endoscopy.   3. Mild intermittent asthma without complication Seeing allergy specialist at [facility], continue therapy as directed.   4. Simple chronic bronchitis ([place]) Seeing asthma specialist at [facility], continue work up at this time.   General Counseling: I have discussed the findings of the evaluation and examination with Harrell Gave.  I have also discussed any further diagnostic evaluation thatmay be needed or ordered today. Dasean verbalizes understanding of the findings of todays visit. We also reviewed his medications today and discussed drug interactions and side effects including but not limited excessive drowsiness and altered mental states. We also discussed that there is always a risk not just to him but also people around him. he has been encouraged to call the office with any questions or concerns that should arise related to todays visit.  No orders of the defined types were placed in this encounter.    Time spent: 25 This patient was seen by Orson Gear AGNP-C in Collaboration with Dr. Devona Konig as a part of collaborative care agreement.   I have personally obtained a history, examined the patient, evaluated laboratory and imaging results, formulated the assessment and plan and placed orders.    Allyne Gee, MD [facility] Pulmonary and Critical Care Sleep medicine

## 2019-03-28 ENCOUNTER — Other Ambulatory Visit
Admission: RE | Admit: 2019-03-28 | Discharge: 2019-03-28 | Disposition: A | Discharge status: Home / Self Care | Payer: BC Managed Care – PPO | Source: Ambulatory Visit | Attending: Surgery | Admitting: Surgery

## 2019-03-28 ENCOUNTER — Other Ambulatory Visit: Payer: Self-pay | Admitting: Surgery

## 2019-03-28 DIAGNOSIS — Z01812 Encounter for preprocedural laboratory examination: Secondary | ICD-10-CM | POA: Insufficient documentation

## 2019-03-28 DIAGNOSIS — Z20822 Contact with and (suspected) exposure to covid-19: Secondary | ICD-10-CM | POA: Diagnosis not present

## 2019-03-28 LAB — SARS CORONAVIRUS 2 (TAT 6-24 HRS): SARS Coronavirus 2: NEGATIVE

## 2019-03-29 ENCOUNTER — Ambulatory Visit: Payer: BC Managed Care – PPO

## 2019-03-29 ENCOUNTER — Ambulatory Visit: Payer: BC Managed Care – PPO | Admitting: Anesthesiology

## 2019-03-29 ENCOUNTER — Encounter: Payer: Self-pay | Admitting: Surgery

## 2019-03-29 ENCOUNTER — Encounter
Admission: RE | Disposition: A | Discharge status: Home / Self Care | Payer: Self-pay | Source: Home / Self Care | Attending: Surgery

## 2019-03-29 ENCOUNTER — Ambulatory Visit
Admission: RE | Admit: 2019-03-29 | Discharge: 2019-03-29 | Disposition: A | Discharge status: Home / Self Care | Payer: BC Managed Care – PPO | Attending: Surgery | Admitting: Surgery

## 2019-03-29 ENCOUNTER — Other Ambulatory Visit: Payer: Self-pay

## 2019-03-29 DIAGNOSIS — N5201 Erectile dysfunction due to arterial insufficiency: Secondary | ICD-10-CM | POA: Diagnosis not present

## 2019-03-29 DIAGNOSIS — M1712 Unilateral primary osteoarthritis, left knee: Secondary | ICD-10-CM | POA: Diagnosis present

## 2019-03-29 DIAGNOSIS — J45909 Unspecified asthma, uncomplicated: Secondary | ICD-10-CM | POA: Diagnosis not present

## 2019-03-29 DIAGNOSIS — Z79899 Other long term (current) drug therapy: Secondary | ICD-10-CM | POA: Insufficient documentation

## 2019-03-29 DIAGNOSIS — K219 Gastro-esophageal reflux disease without esophagitis: Secondary | ICD-10-CM | POA: Diagnosis not present

## 2019-03-29 DIAGNOSIS — Z79890 Hormone replacement therapy (postmenopausal): Secondary | ICD-10-CM | POA: Insufficient documentation

## 2019-03-29 DIAGNOSIS — Z7951 Long term (current) use of inhaled steroids: Secondary | ICD-10-CM | POA: Insufficient documentation

## 2019-03-29 DIAGNOSIS — N401 Enlarged prostate with lower urinary tract symptoms: Secondary | ICD-10-CM | POA: Insufficient documentation

## 2019-03-29 DIAGNOSIS — I10 Essential (primary) hypertension: Secondary | ICD-10-CM | POA: Insufficient documentation

## 2019-03-29 DIAGNOSIS — Z96652 Presence of left artificial knee joint: Secondary | ICD-10-CM

## 2019-03-29 DIAGNOSIS — G4733 Obstructive sleep apnea (adult) (pediatric): Secondary | ICD-10-CM | POA: Insufficient documentation

## 2019-03-29 DIAGNOSIS — E291 Testicular hypofunction: Secondary | ICD-10-CM | POA: Insufficient documentation

## 2019-03-29 HISTORY — PX: TOTAL KNEE ARTHROPLASTY: SHX125

## 2019-03-29 LAB — COMPREHENSIVE METABOLIC PANEL
ALT: 21 U/L (ref 0–44)
AST: 21 U/L (ref 15–41)
Albumin: 4 g/dL (ref 3.5–5.0)
Alkaline Phosphatase: 59 U/L (ref 38–126)
Anion gap: 11 (ref 5–15)
BUN: 14 mg/dL (ref 8–23)
CO2: 27 mmol/L (ref 22–32)
Calcium: 9 mg/dL (ref 8.9–10.3)
Chloride: 104 mmol/L (ref 98–111)
Creatinine, Ser: 0.89 mg/dL (ref 0.61–1.24)
GFR calc Af Amer: >60 mL/min (ref >60)
GFR calc non Af Amer: >60 mL/min (ref >60)
Glucose, Bld: 102 mg/dL — ABNORMAL HIGH (ref 70–99)
Potassium: 3.2 mmol/L — ABNORMAL LOW (ref 3.5–5.1)
Sodium: 142 mmol/L (ref 135–145)
Total Bilirubin: 1.1 mg/dL (ref 0.3–1.2)
Total Protein: 6.8 g/dL (ref 6.5–8.1)

## 2019-03-29 LAB — CBC WITH DIFFERENTIAL/PLATELET
Abs Immature Granulocytes: 0.02 10*3/uL (ref 0.00–0.07)
Basophils Absolute: 0 10*3/uL (ref 0.0–0.1)
Basophils Relative: 0 %
Eosinophils Absolute: 0.1 10*3/uL (ref 0.0–0.5)
Eosinophils Relative: 1 %
HCT: 42.5 % (ref 39.0–52.0)
Hemoglobin: 14.7 g/dL (ref 13.0–17.0)
Immature Granulocytes: 0 %
Lymphocytes Relative: 27 %
Lymphs Abs: 1.9 10*3/uL (ref 0.7–4.0)
MCH: 32.5 pg (ref 26.0–34.0)
MCHC: 34.6 g/dL (ref 30.0–36.0)
MCV: 94 fL (ref 80.0–100.0)
Monocytes Absolute: 0.6 10*3/uL (ref 0.1–1.0)
Monocytes Relative: 8 %
Neutro Abs: 4.7 10*3/uL (ref 1.7–7.7)
Neutrophils Relative %: 64 %
Platelets: 200 10*3/uL (ref 150–400)
RBC: 4.52 MIL/uL (ref 4.22–5.81)
RDW: 13.2 % (ref 11.5–15.5)
WBC: 7.3 10*3/uL (ref 4.0–10.5)
nRBC: 0 % (ref 0.0–0.2)

## 2019-03-29 LAB — URINALYSIS, ROUTINE W REFLEX MICROSCOPIC
Bilirubin Urine: NEGATIVE
Glucose, UA: NEGATIVE mg/dL
Hgb urine dipstick: NEGATIVE
Ketones, ur: NEGATIVE mg/dL
Leukocytes,Ua: NEGATIVE
Nitrite: NEGATIVE
Protein, ur: NEGATIVE mg/dL
Specific Gravity, Urine: 1.017 (ref 1.005–1.030)
pH: 6 (ref 5.0–8.0)

## 2019-03-29 LAB — TYPE AND SCREEN
ABO/RH(D): O POS
Antibody Screen: NEGATIVE

## 2019-03-29 SURGERY — ARTHROPLASTY, KNEE, TOTAL
Anesthesia: Spinal | Site: Knee | Laterality: Left

## 2019-03-29 MED ORDER — ONDANSETRON HCL 4 MG PO TABS: 4.0000 mg | ORAL_TABLET | Freq: Four times a day (QID) | ORAL | Status: DC | PRN

## 2019-03-29 MED ORDER — CHLORHEXIDINE GLUCONATE 4 % EX LIQD: 60.0000 mL | Freq: Once | CUTANEOUS | Status: DC

## 2019-03-29 MED ORDER — SCOPOLAMINE 1 MG/3DAYS TD PT72: 1.0000 | MEDICATED_PATCH | Freq: Once | TRANSDERMAL | Status: DC

## 2019-03-29 MED ORDER — OXYCODONE HCL 5 MG PO TABS
ORAL_TABLET | ORAL | Status: AC
  Filled 2019-03-29: qty 2

## 2019-03-29 MED ORDER — PROPOFOL 500 MG/50ML IV EMUL
INTRAVENOUS | Status: DC | PRN
  Administered 2019-03-29: 100 ug/kg/min via INTRAVENOUS

## 2019-03-29 MED ORDER — KETAMINE HCL 50 MG/ML IJ SOLN
INTRAMUSCULAR | Status: DC | PRN
  Administered 2019-03-29: 30 mg via INTRAMUSCULAR

## 2019-03-29 MED ORDER — APIXABAN 2.5 MG PO TABS: 2.5000 mg | ORAL_TABLET | Freq: Two times a day (BID) | ORAL | 0 refills | Status: DC

## 2019-03-29 MED ORDER — PROPOFOL 500 MG/50ML IV EMUL
INTRAVENOUS | Status: AC
  Filled 2019-03-29: qty 50

## 2019-03-29 MED ORDER — FENTANYL CITRATE (PF) 100 MCG/2ML IJ SOLN
INTRAMUSCULAR | Status: AC
  Administered 2019-03-29: 25 ug via INTRAVENOUS
  Filled 2019-03-29: qty 2

## 2019-03-29 MED ORDER — SODIUM CHLORIDE 0.9 % BOLUS PEDS: 250.0000 mL | Freq: Once | INTRAVENOUS | Status: DC

## 2019-03-29 MED ORDER — KETOROLAC TROMETHAMINE 30 MG/ML IJ SOLN
30.0000 mg | Freq: Once | INTRAMUSCULAR | Status: AC
  Administered 2019-03-29: 30 mg via INTRAVENOUS

## 2019-03-29 MED ORDER — ONDANSETRON HCL 4 MG/2ML IJ SOLN
4.0000 mg | Freq: Once | INTRAMUSCULAR | Status: AC | PRN
  Administered 2019-03-29: 4 mg via INTRAVENOUS

## 2019-03-29 MED ORDER — KETAMINE HCL 50 MG/ML IJ SOLN
INTRAMUSCULAR | Status: AC
  Filled 2019-03-29: qty 10

## 2019-03-29 MED ORDER — FENTANYL CITRATE (PF) 100 MCG/2ML IJ SOLN
25.0000 ug | INTRAMUSCULAR | Status: DC | PRN
  Administered 2019-03-29 (×2): 25 ug via INTRAVENOUS

## 2019-03-29 MED ORDER — BUPIVACAINE HCL (PF) 0.5 % IJ SOLN
INTRAMUSCULAR | Status: AC
  Filled 2019-03-29: qty 30

## 2019-03-29 MED ORDER — MIDAZOLAM HCL 2 MG/2ML IJ SOLN
INTRAMUSCULAR | Status: AC
  Filled 2019-03-29: qty 2

## 2019-03-29 MED ORDER — ACETAMINOPHEN 500 MG PO TABS: 1000.0000 mg | ORAL_TABLET | Freq: Four times a day (QID) | ORAL | Status: DC

## 2019-03-29 MED ORDER — METOCLOPRAMIDE HCL 5 MG/ML IJ SOLN: 5.0000 mg | Freq: Three times a day (TID) | INTRAMUSCULAR | Status: DC | PRN

## 2019-03-29 MED ORDER — CEFAZOLIN SODIUM-DEXTROSE 2-4 GM/100ML-% IV SOLN
2.0000 g | INTRAVENOUS | Status: AC
  Administered 2019-03-29: 2 g via INTRAVENOUS

## 2019-03-29 MED ORDER — CEFAZOLIN SODIUM-DEXTROSE 2-4 GM/100ML-% IV SOLN
INTRAVENOUS | Status: AC
  Administered 2019-03-29: 2 g via INTRAVENOUS
  Filled 2019-03-29: qty 100

## 2019-03-29 MED ORDER — BUPIVACAINE LIPOSOME 1.3 % IJ SUSP
INTRAMUSCULAR | Status: AC
  Filled 2019-03-29: qty 20

## 2019-03-29 MED ORDER — CEFAZOLIN SODIUM-DEXTROSE 2-4 GM/100ML-% IV SOLN
INTRAVENOUS | Status: AC
  Filled 2019-03-29: qty 100

## 2019-03-29 MED ORDER — CEFAZOLIN SODIUM-DEXTROSE 2-4 GM/100ML-% IV SOLN: 2.0000 g | Freq: Four times a day (QID) | INTRAVENOUS | Status: DC

## 2019-03-29 MED ORDER — SODIUM CHLORIDE 0.9 % BOLUS PEDS
250.0000 mL | Freq: Once | INTRAVENOUS | Status: AC
  Administered 2019-03-29: 250 mL via INTRAVENOUS

## 2019-03-29 MED ORDER — BUPIVACAINE-EPINEPHRINE (PF) 0.5% -1:200000 IJ SOLN
INTRAMUSCULAR | Status: DC | PRN
  Administered 2019-03-29: 30 mL via PERINEURAL

## 2019-03-29 MED ORDER — TRANEXAMIC ACID 1000 MG/10ML IV SOLN
INTRAVENOUS | Status: DC | PRN
  Administered 2019-03-29: 1000 mg

## 2019-03-29 MED ORDER — ONDANSETRON HCL 4 MG/2ML IJ SOLN
INTRAMUSCULAR | Status: AC
  Filled 2019-03-29: qty 2

## 2019-03-29 MED ORDER — ONDANSETRON HCL 4 MG/2ML IJ SOLN: 4.0000 mg | Freq: Four times a day (QID) | INTRAMUSCULAR | Status: DC | PRN

## 2019-03-29 MED ORDER — LIDOCAINE HCL (CARDIAC) PF 100 MG/5ML IV SOSY
PREFILLED_SYRINGE | INTRAVENOUS | Status: DC | PRN
  Administered 2019-03-29: 40 mg via INTRAVENOUS
  Administered 2019-03-29: 60 mg via INTRAVENOUS
  Administered 2019-03-29: 100 mg via INTRAVENOUS

## 2019-03-29 MED ORDER — OXYCODONE HCL 5 MG PO TABS
5.0000 mg | ORAL_TABLET | ORAL | Status: DC | PRN
  Administered 2019-03-29: 10 mg via ORAL
  Filled 2019-03-29 (×2): qty 2

## 2019-03-29 MED ORDER — LACTATED RINGERS IV SOLN: INTRAVENOUS | Status: DC

## 2019-03-29 MED ORDER — PROPOFOL 10 MG/ML IV BOLUS
INTRAVENOUS | Status: AC
  Filled 2019-03-29: qty 20

## 2019-03-29 MED ORDER — OXYCODONE HCL 5 MG/5ML PO SOLN
ORAL | Status: AC
  Filled 2019-03-29: qty 10

## 2019-03-29 MED ORDER — METOCLOPRAMIDE HCL 5 MG/ML IJ SOLN: 10.0000 mg | Freq: Once | INTRAMUSCULAR | Status: AC

## 2019-03-29 MED ORDER — SODIUM CHLORIDE 0.9 % IV SOLN
INTRAVENOUS | Status: DC | PRN
  Administered 2019-03-29: 60 mL

## 2019-03-29 MED ORDER — METOCLOPRAMIDE HCL 10 MG PO TABS: 5.0000 mg | ORAL_TABLET | Freq: Three times a day (TID) | ORAL | Status: DC | PRN

## 2019-03-29 MED ORDER — SCOPOLAMINE 1 MG/3DAYS TD PT72
MEDICATED_PATCH | TRANSDERMAL | Status: AC
  Administered 2019-03-29: 1.5 mg via TRANSDERMAL
  Filled 2019-03-29: qty 1

## 2019-03-29 MED ORDER — OXYCODONE HCL 5 MG PO TABS: 5.0000 mg | ORAL_TABLET | ORAL | 0 refills | Status: DC | PRN

## 2019-03-29 MED ORDER — GLYCOPYRROLATE 0.2 MG/ML IJ SOLN
INTRAMUSCULAR | Status: DC | PRN
  Administered 2019-03-29: .2 mg via INTRAVENOUS

## 2019-03-29 MED ORDER — TRANEXAMIC ACID 1000 MG/10ML IV SOLN
INTRAVENOUS | Status: AC
  Filled 2019-03-29: qty 10

## 2019-03-29 MED ORDER — SODIUM CHLORIDE FLUSH 0.9 % IV SOLN
INTRAVENOUS | Status: AC
  Filled 2019-03-29: qty 40

## 2019-03-29 MED ORDER — POTASSIUM CHLORIDE IN NACL 20-0.9 MEQ/L-% IV SOLN
INTRAVENOUS | Status: DC
  Filled 2019-03-29: qty 1000

## 2019-03-29 MED ORDER — METOCLOPRAMIDE HCL 5 MG/ML IJ SOLN
INTRAMUSCULAR | Status: AC
  Administered 2019-03-29: 10 mg via INTRAVENOUS
  Filled 2019-03-29: qty 2

## 2019-03-29 MED ORDER — EPINEPHRINE PF 1 MG/ML IJ SOLN
INTRAMUSCULAR | Status: AC
  Filled 2019-03-29: qty 1

## 2019-03-29 MED ORDER — KETOROLAC TROMETHAMINE 15 MG/ML IJ SOLN: 15.0000 mg | Freq: Four times a day (QID) | INTRAMUSCULAR | Status: DC

## 2019-03-29 MED ORDER — FAMOTIDINE 20 MG PO TABS: 20.0000 mg | ORAL_TABLET | Freq: Once | ORAL | Status: AC

## 2019-03-29 MED ORDER — ACETAMINOPHEN 10 MG/ML IV SOLN
INTRAVENOUS | Status: AC
  Filled 2019-03-29: qty 100

## 2019-03-29 MED ORDER — BUPIVACAINE HCL (PF) 0.5 % IJ SOLN
INTRAMUSCULAR | Status: DC | PRN
  Administered 2019-03-29: 2.5 mL

## 2019-03-29 MED ORDER — ACETAMINOPHEN 10 MG/ML IV SOLN
INTRAVENOUS | Status: DC | PRN
  Administered 2019-03-29: 1000 mg via INTRAVENOUS

## 2019-03-29 MED ORDER — FAMOTIDINE 20 MG PO TABS
ORAL_TABLET | ORAL | Status: AC
  Administered 2019-03-29: 20 mg via ORAL
  Filled 2019-03-29: qty 1

## 2019-03-29 MED ORDER — MIDAZOLAM HCL 5 MG/5ML IJ SOLN
INTRAMUSCULAR | Status: DC | PRN
  Administered 2019-03-29: 2 mg via INTRAVENOUS

## 2019-03-29 SURGICAL SUPPLY — 61 items
BLADE SAW SAG 25X90X1.19 (BLADE) ×2 IMPLANT
BLADE SURG SZ20 CARB STEEL (BLADE) ×2 IMPLANT
BNDG CMPR STD VLCR NS LF 5.8X6 (GAUZE/BANDAGES/DRESSINGS) ×1
BNDG ELASTIC 6X5.8 VLCR NS LF (GAUZE/BANDAGES/DRESSINGS) ×2 IMPLANT
CANISTER SUCT 1200ML W/VALVE (MISCELLANEOUS) ×2 IMPLANT
CANISTER SUCT 3000ML PPV (MISCELLANEOUS) ×2 IMPLANT
CEMENT BONE R 1X40 (Cement) ×4 IMPLANT
CEMENT VACUUM MIXING SYSTEM (MISCELLANEOUS) ×2 IMPLANT
CHLORAPREP W/TINT 26 (MISCELLANEOUS) ×2 IMPLANT
COOLER POLAR GLACIER W/PUMP (MISCELLANEOUS) ×2 IMPLANT
COVER MAYO STAND REUSABLE (DRAPES) ×2 IMPLANT
COVER WAND RF STERILE (DRAPES) ×2 IMPLANT
CUFF TOURN SGL QUICK 24 (TOURNIQUET CUFF)
CUFF TOURN SGL QUICK 30 (TOURNIQUET CUFF) ×1
CUFF TRNQT CYL 24X4X16.5-23 (TOURNIQUET CUFF) IMPLANT
CUFF TRNQT CYL 30X4X21-28X (TOURNIQUET CUFF) IMPLANT
DRAPE 3/4 80X56 (DRAPES) ×2 IMPLANT
DRSG OPSITE POSTOP 4X10 (GAUZE/BANDAGES/DRESSINGS) ×2 IMPLANT
DRSG OPSITE POSTOP 4X8 (GAUZE/BANDAGES/DRESSINGS) ×1 IMPLANT
ELECT CAUTERY BLADE 6.4 (BLADE) ×2 IMPLANT
ELECT REM PT RETURN 9FT ADLT (ELECTROSURGICAL) ×2
ELECTRODE REM PT RTRN 9FT ADLT (ELECTROSURGICAL) ×1 IMPLANT
FEMORAL CR LEFT  70MM (Joint) ×1 IMPLANT
FEMORAL CR LEFT 70MM (Joint) IMPLANT
GLOVE BIO SURGEON STRL SZ7.5 (GLOVE) ×8 IMPLANT
GLOVE BIO SURGEON STRL SZ8 (GLOVE) ×8 IMPLANT
GLOVE BIOGEL PI IND STRL 8 (GLOVE) ×1 IMPLANT
GLOVE BIOGEL PI INDICATOR 8 (GLOVE) ×1
GLOVE INDICATOR 8.0 STRL GRN (GLOVE) ×2 IMPLANT
GOWN STRL REUS W/ TWL LRG LVL3 (GOWN DISPOSABLE) ×1 IMPLANT
GOWN STRL REUS W/ TWL XL LVL3 (GOWN DISPOSABLE) ×1 IMPLANT
GOWN STRL REUS W/TWL LRG LVL3 (GOWN DISPOSABLE) ×2
GOWN STRL REUS W/TWL XL LVL3 (GOWN DISPOSABLE) ×1
HOLDER FOLEY CATH W/STRAP (MISCELLANEOUS) ×1 IMPLANT
HOOD PEEL AWAY FLYTE STAYCOOL (MISCELLANEOUS) ×7 IMPLANT
INSERT TIB BEARING 75X12 (Insert) ×1 IMPLANT
KIT TURNOVER KIT A (KITS) ×2 IMPLANT
NDL SAFETY ECLIPSE 18X1.5 (NEEDLE) ×2 IMPLANT
NDL SPNL 20GX3.5 QUINCKE YW (NEEDLE) ×1 IMPLANT
NEEDLE HYPO 18GX1.5 SHARP (NEEDLE) ×2
NEEDLE SPNL 20GX3.5 QUINCKE YW (NEEDLE) ×2 IMPLANT
NS IRRIG 1000ML POUR BTL (IV SOLUTION) ×2 IMPLANT
PACK TOTAL KNEE (MISCELLANEOUS) ×2 IMPLANT
PAD WRAPON POLAR KNEE (MISCELLANEOUS) ×1 IMPLANT
PATELLA STD 34X8.5 (Orthopedic Implant) ×1 IMPLANT
PLATE KNEE TIBIAL 75MM FIXED (Plate) ×1 IMPLANT
PULSAVAC PLUS IRRIG FAN TIP (DISPOSABLE) ×2
SOL .9 NS 3000ML IRR  AL (IV SOLUTION) ×1
SOL .9 NS 3000ML IRR UROMATIC (IV SOLUTION) ×1 IMPLANT
STAPLER SKIN PROX 35W (STAPLE) ×2 IMPLANT
SUCTION FRAZIER HANDLE 10FR (MISCELLANEOUS) ×1
SUCTION TUBE FRAZIER 10FR DISP (MISCELLANEOUS) ×1 IMPLANT
SUT VIC AB 0 CT1 36 (SUTURE) ×6 IMPLANT
SUT VIC AB 2-0 CT1 27 (SUTURE) ×3
SUT VIC AB 2-0 CT1 TAPERPNT 27 (SUTURE) ×3 IMPLANT
SYR 10ML LL (SYRINGE) ×2 IMPLANT
SYR 20ML LL LF (SYRINGE) ×2 IMPLANT
SYR 30ML LL (SYRINGE) ×6 IMPLANT
TIP FAN IRRIG PULSAVAC PLUS (DISPOSABLE) ×1 IMPLANT
TRAY FOLEY MTR SLVR 16FR STAT (SET/KITS/TRAYS/PACK) ×1 IMPLANT
WRAPON POLAR PAD KNEE (MISCELLANEOUS) ×2

## 2019-03-29 NOTE — OR Nursing (Signed)
Received a call from Dr. Binnie Rail office and they report that home health PT has been set up for this patient. Family notified that they should be receiving a call.

## 2019-03-29 NOTE — H&P (Signed)
Subjective:  Chief complaint:  Left knee pain.  The patient is a 64 y.o. male who presents today to undergo a left total knee arthroplasty.  Surgery is scheduled with Dr. Joice Lofts.  Patient denies any recent trauma or injury.  Pain has been ongoing for over 3 years but worsening over the past several months.  He reports increased pain with going up and down stairs and the pain in interfering with his ability to remain active.  No history of MI, CVA, COPD, asthma or DVT.  Patient Active Problem List   Diagnosis Date Noted  . Erectile dysfunction due to arterial insufficiency 07/15/2018  . Synovitis of elbow 08/17/2017  . Thrombosed external hemorrhoid 05/10/2015  . Allergic rhinitis 11/07/2014  . Arthralgia of hand 11/07/2014  . Airway hyperreactivity 11/07/2014  . Benign prostatic hyperplasia with lower urinary tract symptoms 11/07/2014  . Impotence of organic origin 11/07/2014  . Acid reflux 11/07/2014  . BP (high blood pressure) 11/07/2014  . Hypogonadism in male 11/07/2014  . Obstructive apnea 11/07/2014  . Plantar fasciitis 11/07/2014  . 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 Testicular hypofunction 10/26/2013  . Cellulitis 10/26/2013  . Essential (primary) hypertension 10/26/2013  . Other specified counseling 10/26/2013  . Febrile 10/26/2013  . Pain in soft tissues of limb 10/26/2013  . Need for prophylactic vaccination with combined diphtheria-tetanus-pertussis (DTP) vaccine 10/26/2013  . Palpitations 10/26/2013  . Personal history of perinatal problems 10/26/2013  . Encounter for screening for other viral diseases 09/29/2012  . ASTHMATIC BRONCHITIS, ACUTE 03/25/2007  . HYPERTENSION, SEVERE 03/15/2007  . RHINITIS 03/15/2007  . Asthma 03/15/2007  . ACID REFLUX DISEASE 03/15/2007  . COUGH, CHRONIC 03/15/2007   Past Medical History:  Diagnosis Date  . Allergy   . Asthma   . BP (high blood pressure) 11/07/2014   Should stop combination of Tekturna  (DRI)  and ARB   . Hypertension   . HYPERTENSION, SEVERE 03/15/2007   Qualifier: Diagnosis of  By: Vernie Murders    . Prostate enlargement   . Sleep apnea     Past Surgical History:  Procedure Laterality Date  . APPENDECTOMY  1990   Dr Lemar Livings  . COLONOSCOPY  2008   Dr Lemar Livings  . COLONOSCOPY WITH PROPOFOL N/A 08/13/2016   Procedure: COLONOSCOPY WITH PROPOFOL;  Surgeon: Earline Mayotte, MD;  Location: [facility] ENDOSCOPY;  Service: Endoscopy;  Laterality: N/A;  . HEMORRHOID SURGERY    . HERNIA REPAIR  01/20/14   umbilical hernia  . KNEE ARTHROSCOPY Left   . KNEE SURGERY Left 1986  . NASAL SEPTUM SURGERY    . TONSILLECTOMY    . VASECTOMY      Medications Prior to Admission  Medication Sig Dispense Refill Last Dose  . albuterol (PROAIR HFA) 108 (90 Base) MCG/ACT inhaler Inhale 1-2 puffs into the lungs every 6 (six) hours as needed for wheezing or shortness of breath.    Past Month at Unknown time  . Azelastine HCl 0.15 % SOLN Place 1-2 sprays into both nostrils 2 (two) times daily.    03/29/2019 at Unknown time  . budesonide (PULMICORT) 1 MG/2ML nebulizer solution Inhale 1 mg into the lungs 2 (two) times daily.    03/29/2019 at Unknown time  . budesonide-formoterol (SYMBICORT) 160-4.5 MCG/ACT inhaler Inhale 2 puffs into the lungs 2 (two) times daily.   03/29/2019 at Unknown time  . Calcium Carb-Cholecalciferol (CALCIUM 600+D) 600-800 MG-UNIT TABS Take 1 tablet by mouth daily.   03/28/2019 at Unknown time  .  calcium carbonate (TUMS - DOSED IN MG ELEMENTAL CALCIUM) 500 MG chewable tablet Chew 500 mg by mouth daily as needed for indigestion or heartburn.   03/28/2019 at Unknown time  . Cholecalciferol (D 2000) 2000 UNITS TABS Take 4,000 Units by mouth daily.    03/28/2019 at Unknown time  . DEXILANT 60 MG capsule TAKE ONE CAPSULE BY MOUTH DAILY (Patient taking differently: Take 60 mg by mouth daily. ) 90 capsule 1 03/28/2019 at Unknown time  . dextromethorphan-guaiFENesin (MUCINEX DM) 30-600 MG 12hr  tablet Take 1 tablet by mouth 2 (two) times daily as needed for cough.   Past Month at Unknown time  . dutasteride (AVODART) 0.5 MG capsule Take 1 capsule (0.5 mg total) by mouth daily. (Patient taking differently: Take 0.5 mg by mouth at bedtime. ) 30 capsule 11 03/28/2019 at Unknown time  . famotidine (PEPCID) 40 MG tablet TAKE ONE TABLET BY MOUTH DAILY (Patient taking differently: Take 40 mg by mouth at bedtime. ) 30 tablet 11 03/28/2019 at Unknown time  . hydrochlorothiazide (HYDRODIURIL) 25 MG tablet TAKE ONE TABLET BY MOUTH DAILY (Patient taking differently: Take 25 mg by mouth daily. ) 90 tablet 3 03/28/2019 at Unknown time  . ketotifen (ALAWAY) 0.025 % ophthalmic solution Place 1 drop into both eyes daily.    03/28/2019 at Unknown time  . losartan (COZAAR) 100 MG tablet TAKE ONE TABLET BY MOUTH DAILY (Patient taking differently: Take 100 mg by mouth daily. ) 90 tablet 3 03/28/2019 at Unknown time  . Misc Natural Products (GLUCOSAMINE CHONDROITIN TRIPLE) TABS Take 1 tablet by mouth 2 (two) times daily.   03/28/2019 at Unknown time  . Multiple Vitamin (MULTI-VITAMINS) TABS Take 1 tablet by mouth daily.    03/28/2019 at Unknown time  . Multiple Vitamins-Minerals (PRESERVISION AREDS 2) CAPS Take 1 capsule by mouth 2 (two) times daily.   03/28/2019 at Unknown time  . naproxen (NAPROSYN) 500 MG tablet TAKE ONE TABLET BY MOUTH TWICE A DAY AS NEEDED FOR PAIN (Patient taking differently: Take 500 mg by mouth 2 (two) times daily as needed for moderate pain. ) 60 tablet 5 Past Week at Unknown time  . QNASL 80 MCG/ACT AERS Place 1 spray into both nostrils daily.    03/29/2019 at Unknown time  . tadalafil (CIALIS) 5 MG tablet TAKE 1 TABLET BY MOUTH EVERY DAY AS NEEDED ERECTILE DYSFUNCTION (Patient taking differently: Take 5 mg by mouth at bedtime. ) 90 tablet 3 Past Week at Unknown time  . tamsulosin (FLOMAX) 0.4 MG CAPS capsule Take 1 capsule (0.4 mg total) by mouth daily. 90 capsule 3 03/28/2019 at Unknown time  .  Testosterone (AXIRON) 30 MG/ACT SOLN One swipe per axilla daily (Patient taking differently: Apply 1 application topically See admin instructions. One swipe under each arm once daily) 90 mL 3 Past Week at Unknown time  . Turmeric 500 MG CAPS Take 500 mg by mouth 2 (two) times daily.    03/28/2019 at Unknown time  . verapamil (CALAN-SR) 240 MG CR tablet Take 1 tablet (240 mg total) by mouth 2 (two) times daily. 180 tablet 3 03/28/2019 at Unknown time   Allergies  Allergen Reactions  . Gluten Meal Diarrhea    bloating  . Hydralazine Hives    Chest tightness, severe headache, dyspnea  . Moxifloxacin Hives    Avelox  . Pseudoephedrine Other (See Comments)    Confusion, passed out, numbness  . Sulfa Antibiotics Hives  . Sulfonamide Derivatives Hives    Social History  Tobacco Use  . Smoking status: Never Smoker  . Smokeless tobacco: Never Used  . Tobacco comment: Exposed to second hand smoke as a child  Substance Use Topics  . Alcohol use: Yes    Alcohol/week: 0.0 standard drinks    Comment: occasionally    Family History  Problem Relation Age of Onset  . Cancer Mother        ovarian  . Glaucoma Mother   . Cancer Father        prostate  . Hypertension Sister   . Asthma Sister   . Alcohol abuse Paternal Uncle   . Liver disease Paternal Uncle   . Cancer - Prostate Paternal Uncle   . Cancer Maternal Uncle        lung; two mat uncles  . Cancer Paternal Grandmother        breast  . Cancer Paternal Aunt        lung cancer in a smoker     Review of Systems: As noted above. The patient denies any chest pain, shortness of breath, nausea, vomiting, diarrhea, constipation, belly pain, blood in his/her stool, or burning with urination.  Objective: Temp:  [98.7 F (37.1 C)] 98.7 F (37.1 C) (01/19 0637) Pulse Rate:  [79] 79 (01/19 0637) Resp:  [16] 16 (01/19 0637) BP: (154)/(86) 154/86 (01/19 0637) SpO2:  [98 %] 98 % (01/19 0637) Weight:  [93.4 kg] 93.4 kg (01/19  8242)  Physical Exam: General:  Alert, no acute distress Psychiatric:  Patient is competent for consent with normal mood and affect Cardiovascular:  RRR  Respiratory:  Clear to auscultation. No wheezing. Non-labored breathing GI:  Abdomen is soft and non-tender Skin:  No lesions in the area of chief complaint Neurologic:  Sensation intact distally Lymphatic:  No axillary or cervical lymphadenopathy  Leftknee exam: GAIT: Essentially normal gait and does not use any assistive devices ALIGNMENT:Normal SKIN:Unremarkable SWELLING:Minimal EFFUSION:At most a trace WARMTH:None TENDERNESS:Mild-moderate tenderness along medial joint line, mild tenderness along lateral joint line PNT:IRWE without pain McMURRAY'S:Equivocal PATELLOFEMORAL:No peripatellar tenderness, negative patellar apprehension sign,and mild lateral J-tracking CREPITUS:Mild-moderatepatellofemoral crepitance LACHMAN'S:Negative PIVOT SHIFT:Negative ANTERIOR DRAWER:Negative POSTERIOR DRAWER:Negative VARUS/VALGUS:Stable  He is neurovascularly intact to the left lower extremity and foot.  Knee Imaging: AP weightbearing of both knees, as well as lateral and merchant views of the left knee were obtained at his last visit with the office.  These films demonstrate moderate-severe degenerative changes, primarily involving the patellofemoral compartment with 80% patellofemoral joint space narrowing.  Lesser degenerative changes of the medial and lateral compartments also are noted.  Overall alignment is neutral.  No fractures, lytic lesions, or abnormal calcifications are noted.  Assessment: Left knee osteoarthritis  Plan: 1.  Treatment options were discussed with the patient. 2.  Patient is scheduled to undergo a left total knee arthroplasty with Dr. Roland Rack today. 3.  Risks and benefits discussed with the patient.  Patient understands the risks and wishes to proceed. 4.  This document will serve as the surgical  history and physical. 5.  The patient will follow-up per standard post-op protocol.  The procedure was discussed with the patient, as were the potential risks (including bleeding, infection, nerve and/or blood vessel injury, persistent or recurrent pain, loosening and/or failure of the components, dislocation, need for further surgery, blood clots, strokes, heart attacks and/or arhythmias, pneumonia, etc.) and benefits.  The patient states his understanding and wishes to proceed.   Raquel James, PA-C [place]

## 2019-03-29 NOTE — Evaluation (Signed)
Physical Therapy Evaluation Patient Details Name: [name] [name] MRN: [id number] DOB: 16-Apr-1955 Today's Date: 03/29/2019   History of Present Illness  Pt is a 64 year old M s/p L TKA.  PMH includes PONV, htn and asthma.  Clinical Impression  Pt is a 64 year old M who lives in a two story home with his wife.  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  Pt A&O and able to follow all education regarding Polar Care, stair training, car transfers, home setup, elevation of heel and HEP. with appropriate questions.  Pt able to demonstrate good strength of R LE and fair strength of L LE, though very pain limited (8/10).  Good overall strength of UE's and sensation fully intact.  Pt required min A for bed mobility and experienced nausea and light-headedness when sitting, requesting to return to bed.  RN in to tend to pt and ambulation and stair training deferred to later treatment.  Pt will continue to benefit from skilled PT with focus on strength, knee flexion ROM, pain management and functional mobility.    Follow Up Recommendations Home health PT;Supervision for mobility/OOB    Equipment Recommendations  Rolling walker with 5" wheels;3in1 (PT)    Recommendations for Other Services       Precautions / Restrictions Precautions Precautions: Knee Precaution Booklet Issued: Yes (comment) Restrictions Weight Bearing Restrictions: Yes LLE Weight Bearing: Weight bearing as tolerated      Mobility  Bed Mobility Overal bed mobility: Needs Assistance Bed Mobility: Supine to Sit;Sit to Supine     Supine to sit: Min assist Sit to supine: Min assist   General bed mobility comments: Assisted in bringing L LE over EOB and hand held assist to bring trunk upright.  Pt became nauseated and "lightheaded" and stated " I think I need to lie down", returning to bed with help.  Transfers                    Ambulation/Gait                Stairs            Wheelchair  Mobility    Modified Rankin (Stroke Patients Only)       Balance Overall balance assessment: Needs assistance Sitting-balance support: Feet supported Sitting balance-Leahy Scale: Normal                                       Pertinent Vitals/Pain Pain Assessment: 0-10 Pain Score: 8  Pain Location: L knee Pain Descriptors / Indicators: Aching;Grimacing;Guarding Pain Intervention(s): Limited activity within patient's tolerance;Premedicated before session    Home Living Family/patient expects to be discharged to:: Private residence Living Arrangements: Spouse/significant other Available Help at Discharge: Family;Available 24 hours/day Type of Home: House Home Access: Stairs to enter Entrance Stairs-Rails: None Entrance Stairs-Number of Steps: 3 Home Layout: Two level;Able to live on main level with bedroom/bathroom Home Equipment: None      Prior Function Level of Independence: Independent         Comments: Drives, community ambulator without AD.     Hand Dominance        Extremity/Trunk Assessment   Upper Extremity Assessment Upper Extremity Assessment: Overall WFL for tasks assessed    Lower Extremity Assessment Lower Extremity Assessment: Overall WFL for tasks assessed;LLE deficits/detail LLE: Unable to fully assess due to pain LLE Sensation: WNL  Cervical / Trunk Assessment Cervical / Trunk Assessment: Normal  Communication   Communication: No difficulties  Cognition Arousal/Alertness: Awake/alert Behavior During Therapy: WFL for tasks assessed/performed Overall Cognitive Status: Within Functional Limits for tasks assessed                                 General Comments: Pt A&Ox4, nauseated and symptomatic of hypotension when sitting upright.      General Comments      Exercises Total Joint Exercises Ankle Circles/Pumps: 20 reps;Both;Supine Quad Sets: Right;Left;10 reps;Supine Short Arc Quad: AAROM;Both;10  reps;Supine Heel Slides: Strengthening;Right;10 reps;Supine Hip ABduction/ADduction: Strengthening;Right;10 reps;Supine Straight Leg Raises: Strengthening;Both;10 reps;Supine(Pain limited on L side.) Other Exercises Other Exercises: Reviewed handouts including Polar Care, car transfers, step training, HEP x15 min   Assessment/Plan    PT Assessment Patient needs continued PT services  PT Problem List Decreased mobility;Decreased strength;Decreased range of motion;Decreased activity tolerance;Decreased balance;Decreased knowledge of use of DME       PT Treatment Interventions DME instruction;Therapeutic activities;Gait training;Therapeutic exercise;Patient/family education;Stair training;Balance training;Functional mobility training    PT Goals (Current goals can be found in the Care Plan section)  Acute Rehab PT Goals Patient Stated Goal: To return to generally active lifestyle. PT Goal Formulation: With patient Time For Goal Achievement: 04/12/19 Potential to Achieve Goals: Good    Frequency BID   Barriers to discharge        Co-evaluation               AM-PAC PT "6 Clicks" Mobility  Outcome Measure Help needed turning from your back to your side while in a flat bed without using bedrails?: A Little Help needed moving from lying on your back to sitting on the side of a flat bed without using bedrails?: A Little Help needed moving to and from a bed to a chair (including a wheelchair)?: A Lot Help needed standing up from a chair using your arms (e.g., wheelchair or bedside chair)?: A Lot Help needed to walk in hospital room?: A Lot Help needed climbing 3-5 steps with a railing? : A Lot 6 Click Score: 14    End of Session   Activity Tolerance: Patient limited by pain(Limited by hypotension/nausea and vomiting.) Patient left: in bed;with call bell/phone within reach;with bed alarm set Nurse Communication: Mobility status(RN present) PT Visit Diagnosis: Unsteadiness on  feet (R26.81);Muscle weakness (generalized) (M62.81);Other abnormalities of gait and mobility (R26.89);Pain Pain - Right/Left: Left Pain - part of body: Knee    Time: 1139-1221 PT Time Calculation (min) (ACUTE ONLY): 42 min   Charges:   PT Evaluation $PT Eval Low Complexity: 1 Low PT Treatments $Therapeutic Activity: 8-22 mins        Glenetta Hew, PT, DPT   Glenetta Hew 03/29/2019, 4:02 PM

## 2019-03-29 NOTE — Progress Notes (Signed)
Pt having nausea after dose of Zofran. Pt bladder scan done 718 mL noted. Dr. Henrene Hawking notified. Acknowledged. Orders received.

## 2019-03-29 NOTE — OR Nursing (Signed)
Patient arrived to [facility] post op 6 awake and alert. He is complaining that his pain is increasing and that he is having 7/10 pain at the left knee. Medicated with po oxycodone as ordered. Patient with good pedal pulsed 2+dp and 1+pt pulse of the left foot left foot is warm and patient has good strength and movement. Left knee dressing is dry and intact with polar care on and working. PT came in to work with patient and he still reported 6/10 pain. Patient got faint with therapy even with lying in bed. Cool cloth provided and patient laid back. PT reports they will come back later to treat patient.

## 2019-03-29 NOTE — Transfer of Care (Signed)
Immediate Anesthesia Transfer of Care Note  Patient: [name] [name]  Procedure(s) Performed: TOTAL KNEE ARTHROPLASTY (Left Knee)  Patient Location: PACU  Anesthesia Type:Spinal  Level of Consciousness: awake, alert  and oriented  Airway & Oxygen Therapy: Patient connected to face mask oxygen  Post-op Assessment: Report given to RN and Post -op Vital signs reviewed and stable  Post vital signs: Reviewed and stable  Last Vitals:  Vitals Value Taken Time  BP 120/70 03/29/19 0958  Temp    Pulse 89 03/29/19 0959  Resp 18 03/29/19 0959  SpO2 98 % 03/29/19 0959  Vitals shown include unvalidated device data.  Last Pain:  Vitals:   03/29/19 0637  TempSrc: Tympanic  PainSc: 3          Complications: No apparent anesthesia complications

## 2019-03-29 NOTE — Discharge Instructions (Addendum)
Total Knee Replacement, Care After This sheet gives you information about how to care for yourself after your procedure. Your doctor may also give you more specific instructions. If you have problems or questions, contact your doctor. What can I expect after the procedure? After the procedure, it is common to have:  Pain.  Swelling.  A small amount of blood coming from your cut from surgery (incision).  Clear fluid coming from your cut from surgery.  Limited movement of your knee. Follow these instructions at home: Medicines  Take over-the-counter and prescription medicines only as told by your doctor.  If you were prescribed a blood thinner (anticoagulant), take it as told by your doctor.  Ask your doctor if the medicine prescribed to you: ? Requires you to avoid driving or using heavy machinery. ? Can cause trouble pooping (constipation). You may need to take steps to prevent or treat trouble pooping:  Drink enough fluid to keep your pee (urine) pale yellow.  Take over-the-counter or prescription medicines.  Eat foods that are high in fiber. These include beans, whole grains, and fresh fruits and vegetables.  Limit foods that are high in fat and sugar. These include fried or sweet foods. Bathing  Do not take baths, swim, or use a hot tub until your doctor approves. Ask your doctor if you may take showers. You may only be allowed to take sponge baths.  Keep your bandage (dressing) dry until your doctor says it can be taken off. Incision care and drain care   Follow instructions from your doctor about how to take care of your cut from surgery. Make sure you: ? Wash your hands with soap and water before and after you change your bandage. If you cannot use soap and water, use hand sanitizer. ? Change your bandage as told by your doctor. ? Leave stitches (sutures), skin glue, or skin tape (adhesive) strips in place. They may need to stay in place for 2 weeks or  longer. If tape strips get loose and curl up, you may trim the loose edges. Do not remove tape strips completely unless your doctor says it is okay.  Check your cut from surgery and your drain site every day for signs of infection. Check for: ? More redness, swelling, or pain. ? More fluid or blood. ? Warmth. ? Pus or a bad smell.  If you have a drain, follow instructions from your doctor about caring for it. Managing pain, stiffness, and swelling      If told, put ice on your knee. ? Put ice in a plastic bag or use the icing device (cold flow pad or cryocuff) that you were given. Follow your doctor's directions about how to use the icing device. ? Place a towel between your skin and the bag or between your skin and the icing device. ? Leave the ice on for 20 minutes, 2-3 times per day.  If told, put heat on your knee before you exercise. Use the heat source that your doctor recommends, such as a moist heat pack or a heating pad. ? Place a towel between your skin and the heat source. ? Leave the heat on for 20-30 minutes. ? Remove the heat if your skin turns bright red. This is very important if you are unable to feel pain, heat, or cold. You may have a greater risk of getting burned.  Move your toes often.  Raise (elevate) your knee above the level of your  heart while you are sitting or lying down. ? Use several pillows to keep your leg straight. ? Do not put a pillow just under the knee. If the knee is bent for a long time, this may make the knee stiff.  Wear elastic knee support as told by your doctor. Activity  Rest as told by your doctor.  Do not sit for a long time without moving. Get up to take short walks every 1-2 hours. This is important. Ask for help if you feel weak or unsteady.  Ask your doctor what activities are safe for you.  Avoid activities that put stress on your knees. These include running, jumping rope, and jumping jacks.  Do not play contact sports  until your doctor says it is okay.  Do exercises as told by your physical therapist.  If you have been sent home with a knee joint motion machine (continuous passive motion machine), use it as told by your doctor. Safety   Do not use your leg to support your body weight until your doctor says that you can. Use crutches or a walker as told by your doctor.  Do not drive until your doctor says it is okay. Ask your doctor when it is safe to drive. General instructions  Do not use any products that contain nicotine or tobacco, such as cigarettes, e-cigarettes, and chewing tobacco. These can delay healing. If you need help quitting, ask your doctor.  Wear special socks (compression stockings) as told by your doctor.  Tell your doctor if you plan to have dental work. Also: ? Tell your dentist about your joint replacement. ? Ask your doctor if there are instructions you need to follow before dental care and routine cleanings.  Keep all follow-up visits as told by your doctor. This is important. Contact a doctor if:  You have more redness, swelling, or pain around your cut from surgery or your drain.  You have more fluid or blood coming from your cut from surgery or your drain.  You have pus or a bad smell coming from your cut from surgery or your drain.  Your cut from surgery or your drain area feels warm to the touch.  You have a fever.  Your cut breaks open.  You have knee pain that does not go away.  The movement of your knee is getting worse.  Your new joint feels loose. Get help right away if you have:  Pain in your calf or thigh.  Swelling in your calf or thigh.  Shortness of breath.  Trouble breathing.  Chest pain. Summary  After the procedure, it is common to have pain and swelling, blood or fluid coming from your cut from surgery, and trouble moving your knee.  Follow instructions from your doctor about how to take care of your cut from surgery.  Use  crutches or a walker as told by your doctor.  If you were prescribed a blood thinner, take it as told by your doctor.  Keep all follow-up visits as told by your doctor. This is important. This information is not intended to replace advice given to you by your health care provider. Make sure you discuss any questions you have with your health care provider. Document Revised: 07/05/2018 Document Reviewed: 10/08/2017 Elsevier Patient Education  2020 [employer] Orthopedic discharge instructions: May shower with intact op-site dressing.  Apply ice frequently to knee or use Polar Care. Take oxycodone as prescribed when needed.  May supplement with ES Tylenol if necessary.  Start Eliquis on Wednesday morning, 03/30/2019. May weight-bear as tolerated on the left leg - use rolling walker for balance and support. Follow-up in 10-14 days or as scheduled.

## 2019-03-29 NOTE — Anesthesia Preprocedure Evaluation (Signed)
Anesthesia Evaluation  Patient identified by MRN, date of birth, ID band Patient awake    Reviewed: Allergy & Precautions, NPO status , Patient's Chart, lab work & pertinent test results  History of Anesthesia Complications (+) PONV and history of anesthetic complications  Airway Mallampati: I       Dental   Pulmonary asthma , sleep apnea and Continuous Positive Airway Pressure Ventilation , neg COPD, Not current smoker,           Cardiovascular hypertension, Pt. on medications (-) Past MI and (-) CHF (-) dysrhythmias (-) Valvular Problems/Murmurs     Neuro/Psych neg Seizures    GI/Hepatic Neg liver ROS, GERD  Medicated and Controlled,  Endo/Other  neg diabetes  Renal/GU negative Renal ROS     Musculoskeletal   Abdominal   Peds  Hematology   Anesthesia Other Findings   Reproductive/Obstetrics                             Anesthesia Physical Anesthesia Plan  ASA: II  Anesthesia Plan: Spinal   Post-op Pain Management:    Induction:   PONV Risk Score and Plan:   Airway Management Planned:   Additional Equipment:   Intra-op Plan:   Post-operative Plan:   Informed Consent: I have reviewed the patients History and Physical, chart, labs and discussed the procedure including the risks, benefits and alternatives for the proposed anesthesia with the patient or authorized representative who has indicated his/her understanding and acceptance.       Plan Discussed with:   Anesthesia Plan Comments:         Anesthesia Quick Evaluation

## 2019-03-29 NOTE — H&P (Signed)
H&P to be done by Horris Latino, PA-C, this morning prior to surgery.

## 2019-03-29 NOTE — OR Nursing (Signed)
Patient worked with PT and tolerated well including doing stairs. Pt. Reports decrease pain level to 3/10 even after working with PT. Discharge instructions reviewed with patient and his wife. They report good understanding. D/c to home via wheelchair with rolling walker and bedside commode.

## 2019-03-29 NOTE — Op Note (Signed)
03/29/2019  9:50 AM  Patient:   [name] [name]  Pre-Op Diagnosis:   Degenerative joint disease, left knee.  Post-Op Diagnosis:   Same  Procedure:   Left TKA using all-cemented [employer] Vanguard system with a 70 mm PCR femur, a 75 mm tibial tray with a 12 mm anterior stabilized E-poly insert, and a 34 x 8.5 mm all-poly 3-pegged domed patella.  Surgeon:   Maryagnes Amos, MD  Assistant:   Horris Latino, PA-C; Barrie Dunker, PA-S  Anesthesia:   Spinal  Findings:   As above  Complications:   None  EBL:   5 cc  Fluids:   1500 cc crystalloid  UOP:   None  TT:   95 minutes at 300 mmHg  Drains:   None  Closure:   Staples  Implants:   As above  Brief Clinical Note:   The patient is a 64 year old male with a long history of progressively worsening left knee pain. The patient's symptoms have progressed despite medications, activity modification, injections, etc. The patient's history and examination were consistent with advanced degenerative joint disease of the left knee confirmed by plain radiographs. The patient presents at this time for a left total knee arthroplasty.  Procedure:   The patient was brought into the operating room. After adequate spinal anesthesia was obtained, the patient was lain in the supine position. The left lower extremity was prepped with ChloraPrep solution and draped sterilely. Preoperative antibiotics were administered. After verifying the proper laterality with a surgical timeout, the limb was exsanguinated with an Esmarch and the tourniquet inflated to 300 mmHg. A standard anterior approach to the knee was made through an approximately 7 inch incision. The incision was carried down through the subcutaneous tissues to expose superficial retinaculum. This was split the length of the incision and the medial flap elevated sufficiently to expose the medial retinaculum. The medial retinaculum was incised, leaving a 3-4 mm cuff of tissue on the patella. This  was extended distally along the medial border of the patellar tendon and proximally through the medial third of the quadriceps tendon. A subtotal fat pad excision was performed before the soft tissues were elevated off the anteromedial and anterolateral aspects of the proximal tibia to the level of the collateral ligaments. The anterior portions of the medial and lateral menisci were removed, as was the anterior cruciate ligament. With the knee flexed to 90, the external tibial guide was positioned and the appropriate proximal tibial cut made. This piece was taken to the back table where it was measured and found to be optimally replicated by a 75 mm component.  Attention was directed to the distal femur. The intramedullary canal was accessed through a 3/8" drill hole. The intramedullary guide was inserted and positioned in order to obtain a neutral flexion gap. The intercondylar block was positioned with care taken to avoid notching the anterior cortex of the femur. The appropriate cut was made. Next, the distal cutting block was placed at 5 of valgus alignment. Using the 9 mm slot, the distal cut was made. The distal femur was measured and found to be optimally replicated by the 70 mm component. The 70 mm 4-in-1 cutting block was positioned and first the posterior, then the posterior chamfer, the anterior chamfer, and finally the anterior cuts were made. At this point, the posterior portions medial and lateral menisci were removed. A trial reduction was performed using the appropriate femoral and tibial components with the 10 mm and 12 mm inserts. The  12 mm insert demonstrated excellent stability to varus and valgus stressing both in flexion and extension while permitting full extension. Patella tracking was assessed and found to be excellent. Therefore, the tibial guide position was marked on the proximal tibia. The patella thickness was measured and found to be 17 mm. Therefore, the appropriate cut was made.  The patellar surface was measured and found to be optimally replicated by the 34 mm component. The three peg holes were drilled in place before the trial button was inserted. Patella tracking was assessed and found to be excellent, passing the "no thumb test". The lug holes were drilled into the distal femur before the trial component was removed, leaving only the tibial tray. The keel was then created using the appropriate tower, reamer, and punch.  The bony surfaces were prepared for cementing by irrigating them thoroughly with bacitracin saline solution via the jet lavage system. A bone plug was fashioned from some of the bone that had been removed previously and used to plug the distal femoral canal. In addition, 20 cc of Exparel diluted out to 60 cc with normal saline and 30 cc of 0.5% Sensorcaine were injected into the postero-medial and postero-lateral aspects of the knee, the medial and lateral gutter regions, and the peri-incisional tissues to help with postoperative analgesia. Meanwhile, the cement was being mixed on the back table. When it was ready, the tibial tray was cemented in first. The excess cement was removed using [employer] fast streamer. Next, the femoral component was impacted into place. Again, the excess cement was removed using [employer] fast streamer. The 12 mm trial insert was positioned and the knee brought into extension while the cement hardened. Finally, the patella was cemented into place and secured using the patellar clamp. Again, the excess cement was removed using [employer] fast streamer. Once the cement had hardened, the knee was placed through a range of motion with the findings as described above. Therefore, the trial insert was removed and, after verifying that no cement had been retained posteriorly, the permanent 12 mm anterior stabilized E-polyethylene insert was positioned and secured using the appropriate key locking mechanism. Again the knee was placed through a range of motion with the  findings as described above.  The wound was copiously irrigated with sterile saline solution using the jet lavage system before the quadriceps tendon and retinacular layer were reapproximated using #0 Vicryl interrupted sutures. The superficial retinacular layer also was closed using a running #0 Vicryl suture. A total of 10 cc of transexemic acid (TXA) was injected intra-articularly before the subcutaneous tissues were closed in several layers using 2-0 Vicryl interrupted sutures. The skin was closed using staples. A sterile honeycomb dressing was applied to the skin before the leg was wrapped with an Ace wrap to accommodate the Polar Care device. The patient was then awakened and returned to the recovery room in satisfactory condition after tolerating the procedure well.

## 2019-03-29 NOTE — Progress Notes (Signed)
Physical Therapy Treatment Patient Details Name: [name] [name] MRN: [id number] DOB: Jan 05, 1956 Today's Date: 03/29/2019    History of Present Illness Pt is a 64 year old M s/p L TKA.  PMH includes PONV, htn and asthma.    PT Comments    Pt nauseated and using emesis bag when PT arrived.  Time allowed for pt to recover but he was determined to complete gait and step training at this time.  He ended up doing well with bed mobility, transfers and ambulation (50 ft) with RW.  Pt was able to complete stair training with min A and will have his son present at home to assist as the PT did during treatment.  Pt reporting lower pain level of 3/10 now.  He demonstrated familiarity of HEP exercises and time taken to review all handouts and answer questions.  Pt will continue to benefit from skilled PT with focus on strength, pain management, tolerance to activity, knee ROM and functional mobility.  Follow Up Recommendations  Home health PT;Supervision for mobility/OOB     Equipment Recommendations  Rolling walker with 5" wheels;3in1 (PT)    Recommendations for Other Services       Precautions / Restrictions Precautions Precautions: Knee Precaution Booklet Issued: Yes (comment) Restrictions Weight Bearing Restrictions: Yes LLE Weight Bearing: Weight bearing as tolerated    Mobility  Bed Mobility Overal bed mobility: Needs Assistance Bed Mobility: Supine to Sit;Sit to Supine     Supine to sit: Min assist Sit to supine: Min assist   General bed mobility comments: Assisted in bringing L LE over EOB.  Transfers Overall transfer level: Needs assistance Equipment used: Rolling walker (2 wheeled) Transfers: Sit to/from Stand Sit to Stand: Min guard         General transfer comment: VC's for use of RW and WBAT status.  Pt able to rise on first attempt several times throughout treatment.  Ambulation/Gait Ambulation/Gait assistance: Min guard Gait Distance (Feet): 50  Feet Assistive device: Rolling walker (2 wheeled)     Gait velocity interpretation: 1.31 - 2.62 ft/sec, indicative of limited community ambulator General Gait Details: Antalgic gait, VC's for RW placement and increased step length as gait progressed.   Stairs Stairs: Yes Stairs assistance: Min assist Stair Management: No rails Number of Stairs: 8 General stair comments: Pt able to ascend/descend 4 steps with bilateral handrails with no difficulty.   Practiced ascending with pt's arm around PT's shoulders on R side and folded RW on L side as pt does not have hand rails at home.  Pt was able to do so safely.   Wheelchair Mobility    Modified Rankin (Stroke Patients Only)       Balance Overall balance assessment: Needs assistance Sitting-balance support: Feet supported Sitting balance-Leahy Scale: Normal     Standing balance support: Bilateral upper extremity supported Standing balance-Leahy Scale: Fair                              Cognition Arousal/Alertness: Awake/alert Behavior During Therapy: WFL for tasks assessed/performed Overall Cognitive Status: Within Functional Limits for tasks assessed                                 General Comments: Pt A&Ox4      Exercises Total Joint Exercises Ankle Circles/Pumps: 20 reps;Both;Supine Quad Sets: Right;Left;10 reps;Supine Short Arc Quad: AAROM;Both;10 reps;Supine Heel Slides:  Strengthening;Right;10 reps;Supine Hip ABduction/ADduction: Strengthening;Right;10 reps;Supine Straight Leg Raises: Strengthening;Both;10 reps;Supine(Pain limited on L side.) Goniometric ROM: L knee ext/flex: 6-78 degrees Other Exercises Other Exercises: Education: HEP, Polar Care, step training, HEP.  Issued and adjusted RW and BSC to pt's height.    General Comments        Pertinent Vitals/Pain Pain Assessment: 0-10 Pain Score: 3  Pain Location: L knee Pain Descriptors / Indicators: Aching Pain Intervention(s):  Limited activity within patient's tolerance;Monitored during session;Premedicated before session    Home Living Family/patient expects to be discharged to:: Private residence Living Arrangements: Spouse/significant other Available Help at Discharge: Family;Available 24 hours/day Type of Home: House Home Access: Stairs to enter Entrance Stairs-Rails: None Home Layout: Two level;Able to live on main level with bedroom/bathroom Home Equipment: None      Prior Function Level of Independence: Independent      Comments: Drives, community ambulator without AD.   PT Goals (current goals can now be found in the care plan section) Acute Rehab PT Goals Patient Stated Goal: To return to generally active lifestyle. PT Goal Formulation: With patient Time For Goal Achievement: 04/12/19 Potential to Achieve Goals: Good Progress towards PT goals: Progressing toward goals    Frequency    BID      PT Plan      Co-evaluation              AM-PAC PT "6 Clicks" Mobility   Outcome Measure  Help needed turning from your back to your side while in a flat bed without using bedrails?: A Little Help needed moving from lying on your back to sitting on the side of a flat bed without using bedrails?: A Little Help needed moving to and from a bed to a chair (including a wheelchair)?: A Lot Help needed standing up from a chair using your arms (e.g., wheelchair or bedside chair)?: A Lot Help needed to walk in hospital room?: A Lot Help needed climbing 3-5 steps with a railing? : A Lot 6 Click Score: 14    End of Session   Activity Tolerance: Patient limited by pain(Limited by hypotension/nausea and vomiting.) Patient left: in bed;with call bell/phone within reach;with bed alarm set Nurse Communication: Mobility status(RN present) PT Visit Diagnosis: Unsteadiness on feet (R26.81);Muscle weakness (generalized) (M62.81);Other abnormalities of gait and mobility (R26.89);Pain Pain - Right/Left:  Left Pain - part of body: Knee     Time: 1346-1430 PT Time Calculation (min) (ACUTE ONLY): 44 min  Charges:  $Therapeutic Exercise: 23-37 mins $Therapeutic Activity: 8-22 mins                     Glenetta Hew, PT, DPT   Glenetta Hew 03/29/2019, 5:10 PM

## 2019-03-29 NOTE — Anesthesia Procedure Notes (Signed)
Spinal  Patient location during procedure: OR Staffing Performed: other anesthesia staff  Other anesthesia staff: Staci Acosta, CRNA Preanesthetic Checklist Completed: patient identified, IV checked, site marked, risks and benefits discussed, surgical consent, monitors and equipment checked, pre-op evaluation and timeout performed Spinal Block Patient position: sitting Prep: ChloraPrep Patient monitoring: heart rate, continuous pulse ox, blood pressure and cardiac monitor Approach: midline Location: L4-5 Injection technique: single-shot Needle Needle type: Whitacre and Introducer  Needle gauge: 24 G Needle length: 9 cm Assessment Sensory level: T4 Additional Notes Negative paresthesia. Negative blood return. Positive free-flowing CSF. Expiration date of kit checked and confirmed. Patient tolerated procedure well, without complications.

## 2019-03-29 NOTE — Anesthesia Postprocedure Evaluation (Signed)
Anesthesia Post Note  Patient: [name] [name]  Procedure(s) Performed: TOTAL KNEE ARTHROPLASTY (Left Knee)  Anesthesia Type: Spinal     Last Vitals:  Vitals:   03/29/19 0637  BP: (!) 154/86  Pulse: 79  Resp: 16  Temp: 37.1 C  SpO2: 98%    Last Pain:  Vitals:   03/29/19 0637  TempSrc: Tympanic  PainSc: 3                  [name] [name]

## 2019-04-01 ENCOUNTER — Other Ambulatory Visit: Payer: Self-pay | Admitting: Surgery

## 2019-04-01 ENCOUNTER — Ambulatory Visit
Admission: RE | Admit: 2019-04-01 | Discharge: 2019-04-01 | Disposition: A | Discharge status: Home / Self Care | Payer: BC Managed Care – PPO | Source: Ambulatory Visit | Attending: Surgery | Admitting: Surgery

## 2019-04-01 ENCOUNTER — Other Ambulatory Visit: Payer: Self-pay

## 2019-04-01 DIAGNOSIS — M7989 Other specified soft tissue disorders: Secondary | ICD-10-CM

## 2019-04-01 DIAGNOSIS — Z96652 Presence of left artificial knee joint: Secondary | ICD-10-CM

## 2019-04-01 HISTORY — DX: Presence of left artificial knee joint: Z96.652

## 2019-04-04 ENCOUNTER — Other Ambulatory Visit
Admission: RE | Admit: 2019-04-04 | Discharge: 2019-04-04 | Disposition: A | Discharge status: Home / Self Care | Payer: BC Managed Care – PPO | Source: Ambulatory Visit | Attending: Student | Admitting: Student

## 2019-04-04 DIAGNOSIS — Z96652 Presence of left artificial knee joint: Secondary | ICD-10-CM | POA: Insufficient documentation

## 2019-04-04 LAB — SYNOVIAL CELL COUNT + DIFF, W/ CRYSTALS
Crystals, Fluid: NONE SEEN
Eosinophils-Synovial: 0 %
Lymphocytes-Synovial Fld: 0 %
Monocyte-Macrophage-Synovial Fluid: 0 %
Neutrophil, Synovial: 100 %
Other Cells-SYN: 0
WBC, Synovial: 12557 /mm3 — ABNORMAL HIGH (ref 0–200)

## 2019-04-10 ENCOUNTER — Other Ambulatory Visit: Payer: Self-pay | Admitting: Family Medicine

## 2019-05-20 ENCOUNTER — Telehealth: Payer: Self-pay | Admitting: Family Medicine

## 2019-05-20 NOTE — Telephone Encounter (Signed)
A PA was started from Cover [employer]. I cannot complete the form because there is no record of the 2 morning Low Testosterone labs. I spoke to patient and last year when the request needed to be submitted he had his HR director call and get the authorization for the medication. Patient states he will have them contact the insurance company again this year.

## 2019-06-28 NOTE — Progress Notes (Signed)
Established patient visit    Patient: [name] [name]   DOB: 02-07-1956   64 y.o. Male  MRN: [id number] Visit Date: 06/29/2019  Today's healthcare provider: Megan Mans, MD   Chief Complaint  Patient presents with  . Follow-up    Blood Clot   Subjective    HPI   Patient had total knee replacement surgery on 04/04/2019, and the next day he found out he had a blood clot. Patient is currently taking Eliquis.  As above.  Patient had TKR was found to have a DVT of peroneal and popliteal vessel on left.  He has been on Eliquis since then.  His surgeon told him 3 to 75-month him to follow-up here.  He is still having some swelling but the pain is much better and his knee.  No chest pain or shortness of breath.  No one expected bleeding. Since the last visit the patient is also had sinus surgery which helped him breathing a lot, helped him with his CPAP at night and helped him with his coughing.  He says it helped all the above. Past Medical History:  Diagnosis Date  . Allergy   . Asthma   . BP (high blood pressure) 11/07/2014   Should stop combination of Tekturna (DRI)  and ARB   . Hypertension   . HYPERTENSION, SEVERE 03/15/2007   Qualifier: Diagnosis of  By: Vernie Murders    . Prostate enlargement   . Sleep apnea         Medications: Outpatient Medications Prior to Visit  Medication Sig  . albuterol (PROAIR HFA) 108 (90 Base) MCG/ACT inhaler Inhale 1-2 puffs into the lungs every 6 (six) hours as needed for wheezing or shortness of breath.   Marland Kitchen apixaban (ELIQUIS) 2.5 MG TABS tablet Take 1 tablet (2.5 mg total) by mouth 2 (two) times daily.  . Azelastine HCl 0.15 % SOLN Place 1-2 sprays into both nostrils 2 (two) times daily.   . budesonide (PULMICORT) 1 MG/2ML nebulizer solution Inhale 1 mg into the lungs 2 (two) times daily.   . budesonide-formoterol (SYMBICORT) 160-4.5 MCG/ACT inhaler Inhale 2 puffs into the lungs 2 (two) times daily.  . Calcium Carb-Cholecalciferol  (CALCIUM 600+D) 600-800 MG-UNIT TABS Take 1 tablet by mouth daily.  . Cholecalciferol (D 2000) 2000 UNITS TABS Take 4,000 Units by mouth daily.   Marland Kitchen DEXILANT 60 MG capsule TAKE ONE CAPSULE BY MOUTH DAILY (Patient taking differently: Take 60 mg by mouth daily. )  . dutasteride (AVODART) 0.5 MG capsule Take 1 capsule (0.5 mg total) by mouth daily. (Patient taking differently: Take 0.5 mg by mouth at bedtime. )  . famotidine (PEPCID) 40 MG tablet TAKE ONE TABLET BY MOUTH DAILY (Patient taking differently: Take 40 mg by mouth at bedtime. )  . hydrochlorothiazide (HYDRODIURIL) 25 MG tablet TAKE ONE TABLET BY MOUTH DAILY (Patient taking differently: Take 25 mg by mouth daily. )  . ketotifen (ALAWAY) 0.025 % ophthalmic solution Place 1 drop into both eyes daily.   Marland Kitchen losartan (COZAAR) 100 MG tablet TAKE ONE TABLET BY MOUTH DAILY (Patient taking differently: Take 100 mg by mouth daily. )  . Misc Natural Products (GLUCOSAMINE CHONDROITIN TRIPLE) TABS Take 1 tablet by mouth 2 (two) times daily.  . Multiple Vitamin (MULTI-VITAMINS) TABS Take 1 tablet by mouth daily.   Illa Level 80 MCG/ACT AERS Place 1 spray into both nostrils daily.   . tadalafil (CIALIS) 5 MG tablet TAKE 1 TABLET BY MOUTH EVERY DAY AS  NEEDED ERECTILE DYSFUNCTION (Patient taking differently: Take 5 mg by mouth at bedtime. )  . tamsulosin (FLOMAX) 0.4 MG CAPS capsule Take 1 capsule (0.4 mg total) by mouth daily.  . Testosterone (AXIRON) 30 MG/ACT SOLN One swipe per axilla daily (Patient taking differently: Apply 1 application topically See admin instructions. One swipe under each arm once daily)  . Turmeric 500 MG CAPS Take 500 mg by mouth 2 (two) times daily.   . verapamil (CALAN-SR) 240 MG CR tablet TAKE ONE TABLET BY MOUTH TWICE A DAY  . calcium carbonate (TUMS - DOSED IN MG ELEMENTAL CALCIUM) 500 MG chewable tablet Chew 500 mg by mouth daily as needed for indigestion or heartburn.  . dextromethorphan-guaiFENesin (MUCINEX DM) 30-600 MG 12hr  tablet Take 1 tablet by mouth 2 (two) times daily as needed for cough.  . Multiple Vitamins-Minerals (PRESERVISION AREDS 2) CAPS Take 1 capsule by mouth 2 (two) times daily.  Marland Kitchen oxyCODONE (ROXICODONE) 5 MG immediate release tablet Take 1-2 tablets (5-10 mg total) by mouth every 4 (four) hours as needed for moderate pain or severe pain.   No facility-administered medications prior to visit.    Review of Systems  Constitutional: Negative.   HENT: Negative.   Eyes: Negative.   Respiratory: Negative.   Cardiovascular: Negative.   Gastrointestinal: Negative.   Endocrine: Negative.   Musculoskeletal: Positive for arthralgias.  Allergic/Immunologic: Negative.   Neurological: Negative.   Hematological: Negative.   Psychiatric/Behavioral: Negative.         Objective    BP 127/76 (BP Location: Right Arm, Patient Position: Sitting, Cuff Size: Large)   Pulse 82   Temp (!) 97.1 F (36.2 C) (Temporal)   Ht 6' (1.829 m)   Wt 201 lb 6.4 oz (91.4 kg)   SpO2 97%   BMI 27.31 kg/m  BP Readings from Last 3 Encounters:  06/29/19 127/76  03/29/19 139/69  02/24/19 (!) 141/75   Wt Readings from Last 3 Encounters:  06/29/19 201 lb 6.4 oz (91.4 kg)  03/29/19 206 lb (93.4 kg)  02/24/19 214 lb (97.1 kg)      Physical Exam Vitals reviewed.  Constitutional:      Appearance: He is well-developed.     Comments: Mild  obesity  HENT:     Head: Normocephalic and atraumatic.     Right Ear: External ear normal.     Left Ear: External ear normal.     Nose: Nose normal.  Eyes:     General: No scleral icterus.    Conjunctiva/sclera: Conjunctivae normal.     Pupils: Pupils are equal, round, and reactive to light.  Neck:     Thyroid: No thyromegaly.  Cardiovascular:     Rate and Rhythm: Normal rate and regular rhythm.     Heart sounds: Normal heart sounds.  Pulmonary:     Effort: Pulmonary effort is normal.     Breath sounds: Normal breath sounds.  Abdominal:     Palpations: Abdomen is  soft.  Genitourinary:    Penis: Normal.   Musculoskeletal:     Comments: He has some swelling of the left knee associated with his TKR.  He has no calf swelling but has swelling below the calf with 1+ pedal edema just above the ankle and in the left foot.  Lymphadenopathy:     Cervical: No cervical adenopathy.  Skin:    General: Skin is warm and dry.  Neurological:     General: No focal deficit present.     Mental  Status: He is alert and oriented to person, place, and time.  Psychiatric:        Behavior: Behavior normal.        Thought Content: Thought content normal.        Judgment: Judgment normal.       No results found for any visits on 06/29/19.   Assessment & Plan     1. Obstructive apnea Doing well on CPAP, this is improved since nasal surgery.  He had a deviated septum repair and turbinates reduced  2. Seasonal allergic rhinitis due to pollen   3. COUGH, CHRONIC Improved since above surgery  4. Acute deep vein thrombosis (DVT) of popliteal vein of left lower extremity (HCC) Patient developed left DVT status post left total knee replacement.  I would consider this a provoked DVT.  To the swelling I would repeat his left leg Doppler continue Eliquis 1 more month.  I like to see him then.  No follow-ups on file.      I, Megan Mans, MD, have reviewed all documentation for this visit. The documentation on 06/30/19 for the exam, diagnosis, procedures, and orders are all accurate and complete.    Azlynn Mitnick Wendelyn Breslow, MD  [facility] [phone] (phone) [phone] (fax)  [facility]

## 2019-06-29 ENCOUNTER — Other Ambulatory Visit: Payer: Self-pay

## 2019-06-29 ENCOUNTER — Encounter: Payer: Self-pay | Admitting: Family Medicine

## 2019-06-29 ENCOUNTER — Ambulatory Visit (INDEPENDENT_AMBULATORY_CARE_PROVIDER_SITE_OTHER): Payer: BC Managed Care – PPO | Admitting: Family Medicine

## 2019-06-29 VITALS — BP 127/76 | HR 82 | Temp 97.1°F | Ht 72.0 in | Wt 201.4 lb

## 2019-06-29 DIAGNOSIS — J301 Allergic rhinitis due to pollen: Secondary | ICD-10-CM

## 2019-06-29 DIAGNOSIS — G4733 Obstructive sleep apnea (adult) (pediatric): Secondary | ICD-10-CM

## 2019-06-29 DIAGNOSIS — R059 Cough, unspecified: Secondary | ICD-10-CM

## 2019-06-29 DIAGNOSIS — R05 Cough: Secondary | ICD-10-CM

## 2019-06-29 DIAGNOSIS — I82432 Acute embolism and thrombosis of left popliteal vein: Secondary | ICD-10-CM | POA: Diagnosis not present

## 2019-06-30 ENCOUNTER — Other Ambulatory Visit: Payer: Self-pay

## 2019-06-30 DIAGNOSIS — I82432 Acute embolism and thrombosis of left popliteal vein: Secondary | ICD-10-CM

## 2019-06-30 NOTE — Progress Notes (Signed)
Called to let patient know that Dr. Sullivan Lone wanted him to have his left leg US/doppler repeated and to continue taking Eliquis for 1 more month. Patient advised and scheduled for 1 month f/u.

## 2019-07-04 ENCOUNTER — Ambulatory Visit (INDEPENDENT_AMBULATORY_CARE_PROVIDER_SITE_OTHER): Payer: BC Managed Care – PPO

## 2019-07-04 ENCOUNTER — Other Ambulatory Visit: Payer: Self-pay

## 2019-07-04 DIAGNOSIS — I82432 Acute embolism and thrombosis of left popliteal vein: Secondary | ICD-10-CM | POA: Diagnosis not present

## 2019-07-05 ENCOUNTER — Telehealth: Payer: Self-pay

## 2019-07-05 NOTE — Telephone Encounter (Signed)
Patient advised.

## 2019-07-05 NOTE — Telephone Encounter (Signed)
-----   Message from Maple Hudson., MD sent at 07/05/2019  3:31 PM EDT ----- Ultrasound good.  Stay on the Eliquis until next visit.  It should be next month.

## 2019-07-09 ENCOUNTER — Other Ambulatory Visit: Payer: Self-pay | Admitting: Family Medicine

## 2019-07-09 DIAGNOSIS — K219 Gastro-esophageal reflux disease without esophagitis: Secondary | ICD-10-CM

## 2019-07-09 NOTE — Telephone Encounter (Signed)
Requested Prescriptions  Pending Prescriptions Disp Refills  . DEXILANT 60 MG capsule [Pharmacy Med Name: DEXILANT DR 60 MG CAPSULE] 90 capsule 1    Sig: TAKE ONE CAPSULE BY MOUTH DAILY     Gastroenterology: Proton Pump Inhibitors Passed - 07/09/2019  8:00 AM      Passed - Valid encounter within last 12 months    Recent Outpatient Visits          1 week ago Obstructive apnea   [facility] Maple Hudson., MD   6 months ago Annual physical exam   [facility] Maple Hudson., MD   1 year ago Annual physical exam   [facility] Maple Hudson., MD   2 years ago Annual physical exam   [facility] Maple Hudson., MD   3 years ago Essential (primary) hypertension   [facility] Maple Hudson., MD      Future Appointments            In 1 week Stoioff, Verna Czech, MD [facility]   In 3 weeks Maple Hudson., MD [facility]   In 1 month Maple Hudson., MD [facility]

## 2019-07-15 ENCOUNTER — Other Ambulatory Visit: Payer: Self-pay

## 2019-07-15 ENCOUNTER — Other Ambulatory Visit: Payer: BC Managed Care – PPO

## 2019-07-15 DIAGNOSIS — E291 Testicular hypofunction: Secondary | ICD-10-CM

## 2019-07-15 DIAGNOSIS — N401 Enlarged prostate with lower urinary tract symptoms: Secondary | ICD-10-CM

## 2019-07-16 LAB — PSA: Prostate Specific Ag, Serum: 0.8 ng/mL (ref 0.0–4.0)

## 2019-07-16 LAB — TESTOSTERONE: Testosterone: 528 ng/dL (ref 264–916)

## 2019-07-16 LAB — HEMATOCRIT: Hematocrit: 42.3 % (ref 37.5–51.0)

## 2019-07-18 ENCOUNTER — Telehealth: Payer: Self-pay | Admitting: Family Medicine

## 2019-07-18 ENCOUNTER — Other Ambulatory Visit: Payer: Self-pay | Admitting: Family Medicine

## 2019-07-18 DIAGNOSIS — I82432 Acute embolism and thrombosis of left popliteal vein: Secondary | ICD-10-CM

## 2019-07-18 MED ORDER — APIXABAN 2.5 MG PO TABS: 2.5000 mg | ORAL_TABLET | Freq: Two times a day (BID) | ORAL | 0 refills | Status: DC

## 2019-07-18 MED ORDER — APIXABAN 2.5 MG PO TABS: 2.5000 mg | ORAL_TABLET | Freq: Two times a day (BID) | ORAL | 2 refills | Status: DC

## 2019-07-18 NOTE — Addendum Note (Signed)
Addended by: Lonia Farber E on: 07/18/2019 11:46 AM   Modules accepted: Orders

## 2019-07-18 NOTE — Telephone Encounter (Signed)
Sent patient's refills in for medications.

## 2019-07-18 NOTE — Telephone Encounter (Signed)
Ok to refill for several months.

## 2019-07-18 NOTE — Telephone Encounter (Signed)
Refill resent electronically- was previous refilled by prnt

## 2019-07-18 NOTE — Telephone Encounter (Signed)
Requested medication (s) are due for refill today: yes  Requested medication (s) are on the active medication list: yes  Last refill:  03/29/19  Future visit scheduled: yes  Notes to clinic:  Med was prescribed at discharge from  Hospital   Requested Prescriptions  Pending Prescriptions Disp Refills   apixaban (ELIQUIS) 2.5 MG TABS tablet 30 tablet 0    Sig: Take 1 tablet (2.5 mg total) by mouth 2 (two) times daily.      Hematology:  Anticoagulants Passed - 07/18/2019  9:16 AM      Passed - HGB in normal range and within 360 days    Hemoglobin  Date Value Ref Range Status  03/29/2019 14.7 13.0 - 17.0 g/dL Final  09/73/5329 92.4 13.0 - 17.7 g/dL Final          Passed - PLT in normal range and within 360 days    Platelets  Date Value Ref Range Status  03/29/2019 200 150 - 400 K/uL Final  01/04/2019 203 150 - 450 x10E3/uL Final          Passed - HCT in normal range and within 360 days    Hematocrit  Date Value Ref Range Status  07/15/2019 42.3 37.5 - 51.0 % Final          Passed - Cr in normal range and within 360 days    Creatinine, Ser  Date Value Ref Range Status  03/29/2019 0.89 0.61 - 1.24 mg/dL Final          Passed - Valid encounter within last 12 months    Recent Outpatient Visits           2 weeks ago Obstructive apnea   [facility] Maple Hudson., MD   6 months ago Annual physical exam   [facility] Maple Hudson., MD   1 year ago Annual physical exam   [facility] Maple Hudson., MD   2 years ago Annual physical exam   [facility] Maple Hudson., MD   3 years ago Essential (primary) hypertension   [facility] Maple Hudson., MD       Future Appointments             In 2 days Stoioff, Verna Czech, MD [facility]   In 2 weeks Maple Hudson., MD [facility]   In 1 month Maple Hudson., MD [facility]

## 2019-07-18 NOTE — Telephone Encounter (Signed)
Copied from CRM [id number]. Topic: Quick Communication - Rx Refill/Question >> Jul 18, 2019  9:08 AM Jaquita Rector A wrote: Medication: apixaban (ELIQUIS) 2.5 MG TABS tablet  Has the patient contacted their pharmacy? Yes.   (Agent: If no, request that the patient contact the pharmacy for the refill.) (Agent: If yes, when and what did the pharmacy advise?)  Preferred Pharmacy (with phone number or street name): [facility] [address] - [place], [place] - [address]  Phone:  [phone] Fax:  [phone]     Agent: Please be advised that RX refills may take up to 3 business days. We ask that you follow-up with your pharmacy.

## 2019-07-18 NOTE — Telephone Encounter (Signed)
Pt was rescheduled for his 2 month f/u from June 22  to July 12 due to Dr. Sullivan Lone schedule change and pt's vacation.  Pt states he will need more than 1 refill of  apixaban (ELIQUIS) 2.5 MG TABS tablet to last until July 12th appt.  Please advise.  Thanks, [employer]

## 2019-07-19 ENCOUNTER — Ambulatory Visit: Payer: BLUE CROSS/BLUE SHIELD | Admitting: Urology

## 2019-07-20 ENCOUNTER — Ambulatory Visit (INDEPENDENT_AMBULATORY_CARE_PROVIDER_SITE_OTHER): Payer: BC Managed Care – PPO | Admitting: Urology

## 2019-07-20 ENCOUNTER — Other Ambulatory Visit: Payer: Self-pay

## 2019-07-20 ENCOUNTER — Encounter: Payer: Self-pay | Admitting: Urology

## 2019-07-20 VITALS — BP 133/79 | HR 99 | Ht 72.0 in | Wt 196.0 lb

## 2019-07-20 DIAGNOSIS — N401 Enlarged prostate with lower urinary tract symptoms: Secondary | ICD-10-CM

## 2019-07-20 DIAGNOSIS — E291 Testicular hypofunction: Secondary | ICD-10-CM

## 2019-07-20 NOTE — Progress Notes (Signed)
07/20/19 8:07 AM   [name] [name] 1955-11-23 [id number]  Referring provider: Jerrol [name]., MD [address] [place],  [place] [address] Chief Complaint  Patient presents with  . Follow-up    Urologic history: 1.  Hypogonadism -On topical TRT  2.  BPH with LUTS -Dutasteride/tamsulosin  3.  Erectile dysfunction -Tadalafil    HPI: [name] [name] is a 64 y.o. white male who presents for annual follow-up of hypogonadism and BPH.   -Good energy level and libido -No bothersome LUTS -Denies dysuria, gross hematuria -No flank, abdominal or pelvic pain -Remains on Axiron, tadalafil, dutasteride and tamsulosin  Labs: 07/15/2019 PSA 0.8/testosterone 528/hematocrit 42.3   PMH: Past Medical History:  Diagnosis Date  . Allergy   . Asthma   . BP (high blood pressure) 11/07/2014   Should stop combination of Tekturna (DRI)  and ARB   . Hypertension   . HYPERTENSION, SEVERE 03/15/2007   Qualifier: Diagnosis of  By: Tilden Dome    . Prostate enlargement   . Sleep apnea     Surgical History: Past Surgical History:  Procedure Laterality Date  . APPENDECTOMY  1990   Dr Bary Castilla  . COLONOSCOPY  2008   Dr Bary Castilla  . COLONOSCOPY WITH PROPOFOL N/A 08/13/2016   Procedure: COLONOSCOPY WITH PROPOFOL;  Surgeon: Robert Bellow, MD;  Location: [facility];  Service: Endoscopy;  Laterality: N/A;  . HEMORRHOID SURGERY    . HERNIA REPAIR  44/81/85   umbilical hernia  . KNEE ARTHROSCOPY Left   . KNEE SURGERY Left 1986  . NASAL SEPTUM SURGERY    . TONSILLECTOMY    . TOTAL KNEE ARTHROPLASTY Left 03/29/2019   Procedure: TOTAL KNEE ARTHROPLASTY;  Surgeon: Corky Mull, MD;  Location: [facility] ORS;  Service: Orthopedics;  Laterality: Left;  Marland Kitchen VASECTOMY      Home Medications:  Allergies as of 07/20/2019      Reactions   Gluten Meal Diarrhea   bloating   Hydralazine Hives   Chest tightness, severe headache, dyspnea   Moxifloxacin Hives   Avelox    Pseudoephedrine Other (See Comments)   Confusion, passed out, numbness   Sulfa Antibiotics Hives   Sulfonamide Derivatives Hives      Medication List       Accurate as of Jul 20, 2019 11:59 PM. If you have any questions, ask your nurse or doctor.        Alaway 0.025 % ophthalmic solution Generic drug: ketotifen Place 1 drop into both eyes daily.   apixaban 2.5 MG Tabs tablet Commonly known as: Eliquis Take 1 tablet (2.5 mg total) by mouth 2 (two) times daily.   Azelastine HCl 0.15 % Soln Place 1-2 sprays into both nostrils 2 (two) times daily.   budesonide 1 MG/2ML nebulizer solution Commonly known as: PULMICORT Inhale 1 mg into the lungs 2 (two) times daily.   Calcium 600+D 600-800 MG-UNIT Tabs Generic drug: Calcium Carb-Cholecalciferol Take 1 tablet by mouth daily.   calcium carbonate 500 MG chewable tablet Commonly known as: TUMS - dosed in mg elemental calcium Chew 500 mg by mouth daily as needed for indigestion or heartburn.   D 2000 50 MCG (2000 UT) Tabs Generic drug: Cholecalciferol Take 4,000 Units by mouth daily.   Dexilant 60 MG capsule Generic drug: dexlansoprazole TAKE ONE CAPSULE BY MOUTH DAILY   dextromethorphan-guaiFENesin 30-600 MG 12hr tablet Commonly known as: MUCINEX DM Take 1 tablet by mouth 2 (two) times daily as needed for cough.  dutasteride 0.5 MG capsule Commonly known as: AVODART Take 1 capsule (0.5 mg total) by mouth daily. What changed: when to take this   famotidine 40 MG tablet Commonly known as: PEPCID TAKE ONE TABLET BY MOUTH DAILY What changed: when to take this   Glucosamine Chondroitin Triple Tabs Take 1 tablet by mouth 2 (two) times daily.   hydrochlorothiazide 25 MG tablet Commonly known as: HYDRODIURIL TAKE ONE TABLET BY MOUTH DAILY   losartan 100 MG tablet Commonly known as: COZAAR TAKE ONE TABLET BY MOUTH DAILY   Multi-Vitamins Tabs Take 1 tablet by mouth daily.   oxyCODONE 5 MG immediate release  tablet Commonly known as: Roxicodone Take 1-2 tablets (5-10 mg total) by mouth every 4 (four) hours as needed for moderate pain or severe pain.   PreserVision AREDS 2 Caps Take 1 capsule by mouth 2 (two) times daily.   ProAir HFA 108 (90 Base) MCG/ACT inhaler Generic drug: albuterol Inhale 1-2 puffs into the lungs every 6 (six) hours as needed for wheezing or shortness of breath.   Qnasl 80 MCG/ACT Aers Generic drug: Beclomethasone Dipropionate Place 1 spray into both nostrils daily.   Symbicort 160-4.5 MCG/ACT inhaler Generic drug: budesonide-formoterol Inhale 2 puffs into the lungs 2 (two) times daily.   tadalafil 5 MG tablet Commonly known as: Cialis TAKE 1 TABLET BY MOUTH EVERY DAY AS NEEDED ERECTILE DYSFUNCTION What changed:   how much to take  how to take this  when to take this  additional instructions   tamsulosin 0.4 MG Caps capsule Commonly known as: FLOMAX Take 1 capsule (0.4 mg total) by mouth daily.   Testosterone 30 MG/ACT Soln Commonly known as: Axiron One swipe per axilla daily What changed:   how much to take  how to take this  when to take this  additional instructions   Turmeric 500 MG Caps Take 500 mg by mouth 2 (two) times daily.   verapamil 240 MG CR tablet Commonly known as: CALAN-SR TAKE ONE TABLET BY MOUTH TWICE A DAY       Allergies:  Allergies  Allergen Reactions  . Gluten Meal Diarrhea    bloating  . Hydralazine Hives    Chest tightness, severe headache, dyspnea  . Moxifloxacin Hives    Avelox  . Pseudoephedrine Other (See Comments)    Confusion, passed out, numbness  . Sulfa Antibiotics Hives  . Sulfonamide Derivatives Hives    Family History: Family History  Problem Relation Age of Onset  . Cancer Mother        ovarian  . Glaucoma Mother   . Cancer Father        prostate  . Hypertension Sister   . Asthma Sister   . Alcohol abuse Paternal Uncle   . Liver disease Paternal Uncle   . Cancer - Prostate  Paternal Uncle   . Cancer Maternal Uncle        lung; two mat uncles  . Cancer Paternal Grandmother        breast  . Cancer Paternal Aunt        lung cancer in a smoker    Social History:  reports that he has never smoked. He has never used smokeless tobacco. He reports current alcohol use. He reports that he does not use drugs.   Physical Exam: BP 133/79   Pulse 99   Ht 6' (1.829 m)   Wt 196 lb (88.9 kg)   BMI 26.58 kg/m   Constitutional:  Alert and oriented, No  acute distress. HEENT: [place] AT, moist mucus membranes.  Trachea midline, no masses. Cardiovascular: No clubbing, cyanosis, or edema. Respiratory: Normal respiratory effort, no increased work of breathing. Skin: No rashes, bruises or suspicious lesions. Neurologic: Grossly intact, no focal deficits, moving all 4 extremities. Psychiatric: Normal mood and affect. Prostate: 40 grams, smooth with no nodules   Assessment & Plan:    -Hypogonadism -Stable symptoms on TRT - 6 month follow up lab visit for testosterone/hematocrit - continue annual follow-up -Axiron refilled  -BPH with LUTS -Stable on tamsulosin/dutasteride -Refill sent  - Erectile dysfunction -Stable on tadalafil   [facility] [address], [address] [place], [place] [address] [phone]  I, Francina Ames Peace, am acting as a Neurosurgeon for Dr. Lorin Picket C. Phyllis Abelson.  I have reviewed the above documentation for accuracy and completeness, and I agree with the above.   Riki Altes, MD

## 2019-07-22 ENCOUNTER — Encounter: Payer: Self-pay | Admitting: Urology

## 2019-07-22 MED ORDER — DUTASTERIDE 0.5 MG PO CAPS: 0.5000 mg | ORAL_CAPSULE | Freq: Every day | ORAL | 3 refills | Status: DC

## 2019-07-22 MED ORDER — TADALAFIL 5 MG PO TABS: 5.0000 mg | ORAL_TABLET | Freq: Every day | ORAL | 3 refills | Status: DC

## 2019-07-22 MED ORDER — TESTOSTERONE 30 MG/ACT TD SOLN: TRANSDERMAL | 3 refills | Status: DC

## 2019-07-22 MED ORDER — TAMSULOSIN HCL 0.4 MG PO CAPS: 0.4000 mg | ORAL_CAPSULE | Freq: Every day | ORAL | 3 refills | Status: DC

## 2019-07-28 NOTE — Progress Notes (Signed)
Established patient visit  I,April Miller,acting as a scribe for Megan Mans, MD.,have documented all relevant documentation on the behalf of Megan Mans, MD,as directed by  Megan Mans, MD while in the presence of Megan Mans, MD.   Patient: [name] [name]   DOB: June 22, 1955   64 y.o. Male  MRN: [id number] Visit Date: 08/02/2019  Today's healthcare provider: Megan Mans, MD   Chief Complaint  Patient presents with  . Follow-up    DVT   Subjective    HPI  Patient doing well.  Swelling in the leg is improving. Acute deep vein thrombosis (DVT) of popliteal vein of left lower extremity (HCC) From 06/29/2019-Patient developed left DVT status post left total knee replacement.  I would consider this a provoked DVT. To the swelling I would repeat his left leg Doppler continue Eliquis 1 more month. I like to see him then.        Medications: Outpatient Medications Prior to Visit  Medication Sig  . albuterol (PROAIR HFA) 108 (90 Base) MCG/ACT inhaler Inhale 1-2 puffs into the lungs every 6 (six) hours as needed for wheezing or shortness of breath.   Marland Kitchen apixaban (ELIQUIS) 2.5 MG TABS tablet Take 1 tablet (2.5 mg total) by mouth 2 (two) times daily.  . Azelastine HCl 0.15 % SOLN Place 1-2 sprays into both nostrils 2 (two) times daily.   . budesonide (PULMICORT) 1 MG/2ML nebulizer solution Inhale 1 mg into the lungs 2 (two) times daily.   . budesonide-formoterol (SYMBICORT) 160-4.5 MCG/ACT inhaler Inhale 2 puffs into the lungs 2 (two) times daily.  . Calcium Carb-Cholecalciferol (CALCIUM 600+D) 600-800 MG-UNIT TABS Take 1 tablet by mouth daily.  . calcium carbonate (TUMS - DOSED IN MG ELEMENTAL CALCIUM) 500 MG chewable tablet Chew 500 mg by mouth daily as needed for indigestion or heartburn.  . Cholecalciferol (D 2000) 2000 UNITS TABS Take 4,000 Units by mouth daily.   Marland Kitchen DEXILANT 60 MG capsule TAKE ONE CAPSULE BY MOUTH DAILY  .  dextromethorphan-guaiFENesin (MUCINEX DM) 30-600 MG 12hr tablet Take 1 tablet by mouth 2 (two) times daily as needed for cough.  . dutasteride (AVODART) 0.5 MG capsule Take 1 capsule (0.5 mg total) by mouth daily.  . famotidine (PEPCID) 40 MG tablet TAKE ONE TABLET BY MOUTH DAILY (Patient taking differently: Take 40 mg by mouth at bedtime. )  . hydrochlorothiazide (HYDRODIURIL) 25 MG tablet TAKE ONE TABLET BY MOUTH DAILY (Patient taking differently: Take 25 mg by mouth daily. )  . ketotifen (ALAWAY) 0.025 % ophthalmic solution Place 1 drop into both eyes daily.   Marland Kitchen losartan (COZAAR) 100 MG tablet TAKE ONE TABLET BY MOUTH DAILY (Patient taking differently: Take 100 mg by mouth daily. )  . Misc Natural Products (GLUCOSAMINE CHONDROITIN TRIPLE) TABS Take 1 tablet by mouth 2 (two) times daily.  . Multiple Vitamin (MULTI-VITAMINS) TABS Take 1 tablet by mouth daily.   . Multiple Vitamins-Minerals (PRESERVISION AREDS 2) CAPS Take 1 capsule by mouth 2 (two) times daily.  Illa Level 80 MCG/ACT AERS Place 1 spray into both nostrils daily.   . tadalafil (CIALIS) 5 MG tablet Take 1 tablet (5 mg total) by mouth at bedtime.  . tamsulosin (FLOMAX) 0.4 MG CAPS capsule Take 1 capsule (0.4 mg total) by mouth daily.  . Testosterone (AXIRON) 30 MG/ACT SOLN One swipe per axilla daily  . Turmeric 500 MG CAPS Take 500 mg by mouth 2 (two) times daily.   . verapamil (  CALAN-SR) 240 MG CR tablet TAKE ONE TABLET BY MOUTH TWICE A DAY  . oxyCODONE (ROXICODONE) 5 MG immediate release tablet Take 1-2 tablets (5-10 mg total) by mouth every 4 (four) hours as needed for moderate pain or severe pain.   No facility-administered medications prior to visit.    Review of Systems  Constitutional: Negative for appetite change, chills and fever.  HENT: Negative.   Eyes: Negative.   Respiratory: Negative for chest tightness, shortness of breath and wheezing.   Cardiovascular: Negative for chest pain and palpitations.  Gastrointestinal:  Negative for abdominal pain, nausea and vomiting.  Endocrine: Negative.   Allergic/Immunologic: Negative.   Neurological: Negative.   Hematological: Negative.   Psychiatric/Behavioral: Negative.        Objective    There were no vitals taken for this visit. BP Readings from Last 3 Encounters:  08/02/19 134/76  07/20/19 133/79  06/29/19 127/76   Wt Readings from Last 3 Encounters:  08/02/19 207 lb (93.9 kg)  07/20/19 196 lb (88.9 kg)  06/29/19 201 lb 6.4 oz (91.4 kg)      Physical Exam Vitals reviewed.  Constitutional:      Appearance: He is well-developed.     Comments: Mild  obesity  HENT:     Head: Normocephalic and atraumatic.     Right Ear: External ear normal.     Left Ear: External ear normal.     Nose: Nose normal.  Eyes:     General: No scleral icterus.    Conjunctiva/sclera: Conjunctivae normal.     Pupils: Pupils are equal, round, and reactive to light.  Neck:     Thyroid: No thyromegaly.  Cardiovascular:     Rate and Rhythm: Normal rate and regular rhythm.     Heart sounds: Normal heart sounds.  Pulmonary:     Effort: Pulmonary effort is normal.     Breath sounds: Normal breath sounds.  Abdominal:     Palpations: Abdomen is soft.  Genitourinary:    Penis: Normal.   Musculoskeletal:     Comments: He has some swelling of the left knee associated with his TKR.  He has no calf swelling but has swelling below the calf with 1+ pedal edema just above the ankle and in the left foot.  Lymphadenopathy:     Cervical: No cervical adenopathy.  Skin:    General: Skin is warm and dry.  Neurological:     General: No focal deficit present.     Mental Status: He is alert and oriented to person, place, and time.  Psychiatric:        Behavior: Behavior normal.        Thought Content: Thought content normal.        Judgment: Judgment normal.       No results found for any visits on 08/02/19.  Assessment & Plan     1. Acute deep vein thrombosis (DVT) of  popliteal vein of left lower extremity (HCC) Finish 1 more week of Eliquis then stop.  This was a provoked DVT and has resolved, 2. Mild intermittent asthma, unspecified whether complicated Markedly improved since sinus surgery.  3. Obstructive apnea On CPAP.   No follow-ups on file.      I, Wilhemena Durie, MD, have reviewed all documentation for this visit. The documentation on 08/06/19 for the exam, diagnosis, procedures, and orders are all accurate and complete.    Emanuele Mcwhirter Cranford Mon, MD  [facility] [phone] (phone) [phone] (fax)  [facility]

## 2019-08-02 ENCOUNTER — Other Ambulatory Visit: Payer: Self-pay

## 2019-08-02 ENCOUNTER — Encounter: Payer: Self-pay | Admitting: Family Medicine

## 2019-08-02 ENCOUNTER — Ambulatory Visit (INDEPENDENT_AMBULATORY_CARE_PROVIDER_SITE_OTHER): Payer: BC Managed Care – PPO | Admitting: Family Medicine

## 2019-08-02 VITALS — BP 134/76 | HR 90 | Temp 96.9°F | Resp 16 | Ht 72.0 in | Wt 207.0 lb

## 2019-08-02 DIAGNOSIS — J4520 Mild intermittent asthma, uncomplicated: Secondary | ICD-10-CM

## 2019-08-02 DIAGNOSIS — I82432 Acute embolism and thrombosis of left popliteal vein: Secondary | ICD-10-CM

## 2019-08-02 DIAGNOSIS — G4733 Obstructive sleep apnea (adult) (pediatric): Secondary | ICD-10-CM | POA: Diagnosis not present

## 2019-08-30 ENCOUNTER — Ambulatory Visit: Payer: Self-pay | Admitting: Family Medicine

## 2019-09-19 ENCOUNTER — Ambulatory Visit: Payer: Self-pay | Admitting: Family Medicine

## 2019-10-08 ENCOUNTER — Other Ambulatory Visit: Payer: Self-pay | Admitting: Family Medicine

## 2019-10-08 DIAGNOSIS — I10 Essential (primary) hypertension: Secondary | ICD-10-CM

## 2019-10-08 NOTE — Telephone Encounter (Signed)
Requested Prescriptions  Pending Prescriptions Disp Refills   losartan (COZAAR) 100 MG tablet [Pharmacy Med Name: LOSARTAN POTASSIUM 100 MG TAB] 90 tablet 1    Sig: TAKE ONE TABLET BY MOUTH DAILY     Cardiovascular:  Angiotensin Receptor Blockers Failed - 10/08/2019 12:33 PM      Failed - Cr in normal range and within 180 days    Creatinine, Ser  Date Value Ref Range Status  03/29/2019 0.89 0.61 - 1.24 mg/dL Final         Failed - K in normal range and within 180 days    Potassium  Date Value Ref Range Status  03/29/2019 3.2 (L) 3.5 - 5.1 mmol/L Final         Passed - Patient is not pregnant      Passed - Last BP in normal range    BP Readings from Last 1 Encounters:  08/02/19 134/76         Passed - Valid encounter within last 6 months    Recent Outpatient Visits          2 months ago Acute deep vein thrombosis (DVT) of popliteal vein of left lower extremity [facility])   [facility] Maple Hudson., MD   3 months ago Obstructive apnea   [facility] Maple Hudson., MD   9 months ago Annual physical exam   [facility] Maple Hudson., MD   1 year ago Annual physical exam   [facility] Maple Hudson., MD   2 years ago Annual physical exam   [facility] Maple Hudson., MD      Future Appointments            In 2 months Maple Hudson., MD [facility]   In 9 months Stoioff, Verna Czech, MD [facility]

## 2019-10-23 ENCOUNTER — Other Ambulatory Visit: Payer: Self-pay | Admitting: Family Medicine

## 2019-10-23 NOTE — Telephone Encounter (Signed)
Requested Prescriptions  Pending Prescriptions Disp Refills   hydrochlorothiazide (HYDRODIURIL) 25 MG tablet [Pharmacy Med Name: hydroCHLOROthiazide 25 MG TABLET] 90 tablet 3    Sig: TAKE ONE TABLET BY MOUTH DAILY     Cardiovascular: Diuretics - Thiazide Failed - 10/23/2019 12:18 PM      Failed - K in normal range and within 360 days    Potassium  Date Value Ref Range Status  03/29/2019 3.2 (L) 3.5 - 5.1 mmol/L Final         Passed - Ca in normal range and within 360 days    Calcium  Date Value Ref Range Status  03/29/2019 9.0 8.9 - 10.3 mg/dL Final         Passed - Cr in normal range and within 360 days    Creatinine, Ser  Date Value Ref Range Status  03/29/2019 0.89 0.61 - 1.24 mg/dL Final         Passed - Na in normal range and within 360 days    Sodium  Date Value Ref Range Status  03/29/2019 142 135 - 145 mmol/L Final  01/04/2019 141 134 - 144 mmol/L Final         Passed - Last BP in normal range    BP Readings from Last 1 Encounters:  08/02/19 134/76         Passed - Valid encounter within last 6 months    Recent Outpatient Visits          2 months ago Acute deep vein thrombosis (DVT) of popliteal vein of left lower extremity [facility])   [facility] Maple Hudson., MD   3 months ago Obstructive apnea   [facility] Maple Hudson., MD   9 months ago Annual physical exam   [facility] Maple Hudson., MD   1 year ago Annual physical exam   [facility] Maple Hudson., MD   2 years ago Annual physical exam   [facility] Maple Hudson., MD      Future Appointments            In 2 months Maple Hudson., MD [facility]   In 9 months Stoioff, Verna Czech, MD [facility]

## 2019-12-30 NOTE — Progress Notes (Signed)
Complete physical exam   Patient: [name] [name]   DOB: 02/01/1956   64 y.o. Male  MRN: [id number] Visit Date: 01/02/2020  Today's healthcare provider: Megan Mansichard Jone Panebianco Jr, MD   Chief Complaint  Patient presents with  . Annual Exam  137 Subjective    [name] HawsChristopher D Fear is a 64 y.o. male who presents today for a complete physical exam.  He reports consuming a general diet. The patient does not participate in regular exercise at present. He generally feels fairly well. He reports sleeping well. He does have additional problems to discuss today.  He is now expecting his first grandchild in March 2022 HPI  Patient reports that he may have bronchitis again. He was just seen by Dr. Ellie LunchLugar Pulmonologist. He was placed on the Spiriva.  His cough is returned and he is undergoing work-up for refluxing per pulmonologist.  3 days ago his cough got worse and he now has wheezing and the cough is productive.    Past Medical History:  Diagnosis Date  . Allergy   . Asthma   . BP (high blood pressure) 11/07/2014   Should stop combination of Tekturna (DRI)  and ARB   . Hypertension   . HYPERTENSION, SEVERE 03/15/2007   Qualifier: Diagnosis of  By: Vernie Murdersaskin, Leslie    . Prostate enlargement   . Sleep apnea    Past Surgical History:  Procedure Laterality Date  . APPENDECTOMY  1990   Dr Lemar LivingsByrnett  . COLONOSCOPY  2008   Dr Lemar LivingsByrnett  . COLONOSCOPY WITH PROPOFOL N/A 08/13/2016   Procedure: COLONOSCOPY WITH PROPOFOL;  Surgeon: [name], [name], MD;  Location: [facility];  Service: Endoscopy;  Laterality: N/A;  . HEMORRHOID SURGERY    . HERNIA REPAIR  01/20/14   umbilical hernia  . KNEE ARTHROSCOPY Left   . KNEE SURGERY Left 1986  . NASAL SEPTUM SURGERY    . TONSILLECTOMY    . TOTAL KNEE ARTHROPLASTY Left 03/29/2019   Procedure: TOTAL KNEE ARTHROPLASTY;  Surgeon: Christena FlakePoggi, John J, MD;  Location: [facility] ORS;  Service: Orthopedics;  Laterality: Left;  Marland Kitchen. VASECTOMY     Social History    Socioeconomic History  . Marital status: Married    Spouse name: Not on file  . Number of children: Not on file  . Years of education: Not on file  . Highest education level: Not on file  Occupational History  . Not on file  Tobacco Use  . Smoking status: Never Smoker  . Smokeless tobacco: Never Used  . Tobacco comment: Exposed to second hand smoke as a child  Substance and Sexual Activity  . Alcohol use: Yes    Alcohol/week: 0.0 standard drinks    Comment: occasionally  . Drug use: No  . Sexual activity: Not on file  Other Topics Concern  . Not on file  Social History Narrative  . Not on file   Social Determinants of Health   Financial Resource Strain:   . Difficulty of Paying Living Expenses: Not on file  Food Insecurity:   . Worried About Programme researcher, broadcasting/film/videounning Out of Food in the Last Year: Not on file  . Ran Out of Food in the Last Year: Not on file  Transportation Needs:   . Lack of Transportation (Medical): Not on file  . Lack of Transportation (Non-Medical): Not on file  Physical Activity:   . Days of Exercise per Week: Not on file  . Minutes of Exercise per Session: Not on file  Stress:   . Feeling of Stress : Not on file  Social Connections:   . Frequency of Communication with Friends and Family: Not on file  . Frequency of Social Gatherings with Friends and Family: Not on file  . Attends Religious Services: Not on file  . Active Member of Clubs or Organizations: Not on file  . Attends Banker Meetings: Not on file  . Marital Status: Not on file  Intimate Partner Violence:   . Fear of Current or Ex-Partner: Not on file  . Emotionally Abused: Not on file  . Physically Abused: Not on file  . Sexually Abused: Not on file   Family Status  Relation Name Status  . Mother  Deceased at age 54  . Father  Deceased at age 36  . Sister  Alive  . Sister  Alive  . Oneal Grout  Deceased  . Sister  Alive  . Sister  Alive  . Oneal Grout  (Not Specified)  . Mat Uncle   (Not Specified)  . PGM  (Not Specified)  . Emelda Brothers  (Not Specified)   Family History  Problem Relation Age of Onset  . Cancer Mother        ovarian  . Glaucoma Mother   . Cancer Father        prostate  . Hypertension Sister   . Asthma Sister   . Alcohol abuse Paternal Uncle   . Liver disease Paternal Uncle   . Cancer - Prostate Paternal Uncle   . Cancer Maternal Uncle        lung; two mat uncles  . Cancer Paternal Grandmother        breast  . Cancer Paternal Aunt        lung cancer in a smoker   Allergies  Allergen Reactions  . Gluten Meal Diarrhea    bloating  . Hydralazine Hives    Chest tightness, severe headache, dyspnea  . Moxifloxacin Hives    Avelox  . Pseudoephedrine Other (See Comments)    Confusion, passed out, numbness  . Sulfa Antibiotics Hives  . Sulfonamide Derivatives Hives    Patient Care Team: Maple Hudson., MD as PCP - General (Family Medicine) Maple Hudson., MD (Family Medicine) Lemar Livings Merrily Pew, MD (General Surgery)   Medications: Outpatient Medications Prior to Visit  Medication Sig  . albuterol (PROAIR HFA) 108 (90 Base) MCG/ACT inhaler Inhale 1-2 puffs into the lungs every 6 (six) hours as needed for wheezing or shortness of breath.   Marland Kitchen apixaban (ELIQUIS) 2.5 MG TABS tablet Take 1 tablet (2.5 mg total) by mouth 2 (two) times daily.  . Azelastine HCl 0.15 % SOLN Place 1-2 sprays into both nostrils 2 (two) times daily.   . budesonide-formoterol (SYMBICORT) 160-4.5 MCG/ACT inhaler Inhale 2 puffs into the lungs 2 (two) times daily.  . Calcium Carb-Cholecalciferol (CALCIUM 600+D) 600-800 MG-UNIT TABS Take 1 tablet by mouth daily.  . calcium carbonate (TUMS - DOSED IN MG ELEMENTAL CALCIUM) 500 MG chewable tablet Chew 500 mg by mouth daily as needed for indigestion or heartburn.  . Cholecalciferol (D 2000) 2000 UNITS TABS Take 4,000 Units by mouth daily.   Marland Kitchen DEXILANT 60 MG capsule TAKE ONE CAPSULE BY MOUTH DAILY  .  dextromethorphan-guaiFENesin (MUCINEX DM) 30-600 MG 12hr tablet Take 1 tablet by mouth 2 (two) times daily as needed for cough.  . dutasteride (AVODART) 0.5 MG capsule Take 1 capsule (0.5 mg total) by mouth daily.  Marland Kitchen  famotidine (PEPCID) 40 MG tablet TAKE ONE TABLET BY MOUTH DAILY (Patient taking differently: Take 40 mg by mouth at bedtime. )  . hydrochlorothiazide (HYDRODIURIL) 25 MG tablet TAKE ONE TABLET BY MOUTH DAILY  . ketotifen (ALAWAY) 0.025 % ophthalmic solution Place 1 drop into both eyes daily.   Marland Kitchen losartan (COZAAR) 100 MG tablet TAKE ONE TABLET BY MOUTH DAILY  . Misc Natural Products (GLUCOSAMINE CHONDROITIN TRIPLE) TABS Take 1 tablet by mouth 2 (two) times daily.  . Multiple Vitamin (MULTI-VITAMINS) TABS Take 1 tablet by mouth daily.   . Multiple Vitamins-Minerals (PRESERVISION AREDS 2) CAPS Take 1 capsule by mouth 2 (two) times daily.  Illa Level 80 MCG/ACT AERS Place 1 spray into both nostrils daily.   Marland Kitchen SPIRIVA RESPIMAT 1.25 MCG/ACT AERS SMARTSIG:2 Puff(s) Via Inhaler Daily  . tadalafil (CIALIS) 5 MG tablet Take 1 tablet (5 mg total) by mouth at bedtime.  . tamsulosin (FLOMAX) 0.4 MG CAPS capsule Take 1 capsule (0.4 mg total) by mouth daily.  . Testosterone (AXIRON) 30 MG/ACT SOLN One swipe per axilla daily  . Turmeric 500 MG CAPS Take 500 mg by mouth 2 (two) times daily.   . verapamil (CALAN-SR) 240 MG CR tablet TAKE ONE TABLET BY MOUTH TWICE A DAY  . budesonide (PULMICORT) 1 MG/2ML nebulizer solution Inhale 1 mg into the lungs 2 (two) times daily.   Marland Kitchen oxyCODONE (ROXICODONE) 5 MG immediate release tablet Take 1-2 tablets (5-10 mg total) by mouth every 4 (four) hours as needed for moderate pain or severe pain.   No facility-administered medications prior to visit.    Review of Systems  Constitutional: Negative.   HENT: Positive for congestion and postnasal drip.   Eyes: Negative.   Respiratory: Positive for apnea, cough and wheezing. Negative for chest tightness and shortness of  breath.   Cardiovascular: Negative.   Gastrointestinal: Negative.   Endocrine: Negative.   Genitourinary: Negative.   Musculoskeletal: Negative.   Skin: Negative.   Allergic/Immunologic: Negative.   Neurological: Negative.   Hematological: Negative.   Psychiatric/Behavioral: Negative.   All other systems reviewed and are negative.      Objective    BP 137/79 (BP Location: Right Arm, Patient Position: Sitting, Cuff Size: Large)   Pulse 92   Temp 98.6 F (37 C) (Oral)   Resp 16   Ht 6' (1.829 m)   Wt 213 lb (96.6 kg)   SpO2 96%   BMI 28.89 kg/m  BP Readings from Last 3 Encounters:  01/02/20 137/79  08/02/19 134/76  07/20/19 133/79   Wt Readings from Last 3 Encounters:  01/02/20 213 lb (96.6 kg)  08/02/19 207 lb (93.9 kg)  07/20/19 196 lb (88.9 kg)      Physical Exam Vitals reviewed.  Constitutional:      Appearance: He is well-developed. He is obese.     Comments: Mild  Obesity--trunkal.  HENT:     Head: Normocephalic and atraumatic.     Right Ear: External ear normal.     Left Ear: External ear normal.     Nose: Nose normal.  Eyes:     General: No scleral icterus.    Conjunctiva/sclera: Conjunctivae normal.     Pupils: Pupils are equal, round, and reactive to light.  Neck:     Thyroid: No thyromegaly.  Cardiovascular:     Rate and Rhythm: Normal rate and regular rhythm.     Heart sounds: Normal heart sounds.  Pulmonary:     Effort: Pulmonary effort is normal.  Breath sounds: Rhonchi present.     Comments: Diffuse rhonchi both lungs, worse in the bases. Abdominal:     Palpations: Abdomen is soft.  Genitourinary:    Penis: Normal.      Rectum: Guaiac result negative.  Lymphadenopathy:     Cervical: No cervical adenopathy.  Skin:    General: Skin is warm and dry.  Neurological:     Mental Status: He is alert and oriented to person, place, and time.  Psychiatric:        Behavior: Behavior normal.        Thought Content: Thought content normal.         Judgment: Judgment normal.       Last depression screening scores PHQ 2/9 Scores 01/02/2020 12/29/2018 12/23/2017  PHQ - 2 Score 0 0 0  PHQ- 9 Score - 0 -   Last fall risk screening Fall Risk  12/23/2017  Falls in the past year? No   Last Audit-C alcohol use screening Alcohol Use Disorder Test (AUDIT) 01/02/2020  1. How often do you have a drink containing alcohol? 2  2. How many drinks containing alcohol do you have on a typical day when you are drinking? 0  3. How often do you have six or more drinks on one occasion? 0  AUDIT-C Score 2  Alcohol Brief Interventions/Follow-up AUDIT Score <7 follow-up not indicated   A score of 3 or more in women, and 4 or more in men indicates increased risk for alcohol abuse, EXCEPT if all of the points are from question 1   No results found for any visits on 01/02/20.  Assessment & Plan    Routine Health Maintenance and Physical Exam  Exercise Activities and Dietary recommendations Goals   None     Immunization History  Administered Date(s) Administered  . H1N1 01/18/2008  . Influenza Split 01/12/2010  . Influenza,inj,Quad PF,6+ Mos 11/28/2014  . Influenza-Unspecified 12/01/2016, 12/01/2017, 11/23/2019  . PFIZER SARS-COV-2 Vaccination 05/17/2019, 06/07/2019  . Pneumococcal Polysaccharide-23 09/04/1998  . Td 11/15/2003  . Tdap 10/11/2013    Health Maintenance  Topic Date Due  . HIV Screening  Never done  . TETANUS/TDAP  10/12/2023  . COLONOSCOPY  08/14/2026  . INFLUENZA VACCINE  Completed  . COVID-19 Vaccine  Completed  . Hepatitis C Screening  Completed    Discussed health benefits of physical activity, and encouraged him to engage in regular exercise appropriate for his age and condition.    No follow-ups on file.     1. Annual physical exam  - Lipid panel - CBC w/Diff/Platelet - Comprehensive Metabolic Panel (CMET) - TSH  2. Essential (primary) hypertension   3. Obstructive apnea   4. Mild  intermittent asthma, unspecified whether complicated   5. Seasonal allergic rhinitis due to pollen   6. Hypogonadism in male PSA per urology  7. Benign prostatic hyperplasia with lower urinary tract symptoms, symptom details unspecified   8. Mild intermittent asthmatic bronchitis with acute exacerbation  - cefdinir (OMNICEF) 300 MG capsule; Take 1 capsule (300 mg total) by mouth 2 (two) times daily.  Dispense: 20 capsule; Refill: 1    Kaden Daughdrill Wendelyn Breslow, MD  [facility] [phone] (phone) [phone] (fax)  [facility]

## 2020-01-02 ENCOUNTER — Ambulatory Visit (INDEPENDENT_AMBULATORY_CARE_PROVIDER_SITE_OTHER): Payer: BC Managed Care – PPO | Admitting: Family Medicine

## 2020-01-02 ENCOUNTER — Other Ambulatory Visit: Payer: Self-pay

## 2020-01-02 ENCOUNTER — Encounter: Payer: Self-pay | Admitting: Family Medicine

## 2020-01-02 VITALS — BP 137/79 | HR 92 | Temp 98.6°F | Resp 16 | Ht 72.0 in | Wt 213.0 lb

## 2020-01-02 DIAGNOSIS — G4733 Obstructive sleep apnea (adult) (pediatric): Secondary | ICD-10-CM

## 2020-01-02 DIAGNOSIS — I10 Essential (primary) hypertension: Secondary | ICD-10-CM

## 2020-01-02 DIAGNOSIS — J4520 Mild intermittent asthma, uncomplicated: Secondary | ICD-10-CM

## 2020-01-02 DIAGNOSIS — J4521 Mild intermittent asthma with (acute) exacerbation: Secondary | ICD-10-CM

## 2020-01-02 DIAGNOSIS — E291 Testicular hypofunction: Secondary | ICD-10-CM

## 2020-01-02 DIAGNOSIS — N401 Enlarged prostate with lower urinary tract symptoms: Secondary | ICD-10-CM

## 2020-01-02 DIAGNOSIS — Z0000 Encounter for general adult medical examination without abnormal findings: Secondary | ICD-10-CM

## 2020-01-02 DIAGNOSIS — J301 Allergic rhinitis due to pollen: Secondary | ICD-10-CM

## 2020-01-02 MED ORDER — CEFDINIR 300 MG PO CAPS: 300.0000 mg | ORAL_CAPSULE | Freq: Two times a day (BID) | ORAL | 1 refills | Status: DC

## 2020-01-04 LAB — CBC WITH DIFFERENTIAL/PLATELET
Basophils Absolute: 0 10*3/uL (ref 0.0–0.2)
Basos: 0 %
EOS (ABSOLUTE): 0 10*3/uL (ref 0.0–0.4)
Eos: 1 %
Hematocrit: 39.7 % (ref 37.5–51.0)
Hemoglobin: 13.6 g/dL (ref 13.0–17.7)
Immature Grans (Abs): 0 10*3/uL (ref 0.0–0.1)
Immature Granulocytes: 0 %
Lymphocytes Absolute: 1.1 10*3/uL (ref 0.7–3.1)
Lymphs: 15 %
MCH: 32.5 pg (ref 26.6–33.0)
MCHC: 34.3 g/dL (ref 31.5–35.7)
MCV: 95 fL (ref 79–97)
Monocytes Absolute: 0.5 10*3/uL (ref 0.1–0.9)
Monocytes: 7 %
Neutrophils Absolute: 5.6 10*3/uL (ref 1.4–7.0)
Neutrophils: 77 %
Platelets: 191 10*3/uL (ref 150–450)
RBC: 4.19 x10E6/uL (ref 4.14–5.80)
RDW: 12.2 % (ref 11.6–15.4)
WBC: 7.3 10*3/uL (ref 3.4–10.8)

## 2020-01-04 LAB — COMPREHENSIVE METABOLIC PANEL
ALT: 11 IU/L (ref 0–44)
AST: 9 IU/L (ref 0–40)
Albumin/Globulin Ratio: 2.3 — ABNORMAL HIGH (ref 1.2–2.2)
Albumin: 4.2 g/dL (ref 3.8–4.8)
Alkaline Phosphatase: 78 IU/L (ref 44–121)
BUN/Creatinine Ratio: 10 (ref 10–24)
BUN: 10 mg/dL (ref 8–27)
Bilirubin Total: 0.7 mg/dL (ref 0.0–1.2)
CO2: 26 mmol/L (ref 20–29)
Calcium: 9.2 mg/dL (ref 8.6–10.2)
Chloride: 104 mmol/L (ref 96–106)
Creatinine, Ser: 1.01 mg/dL (ref 0.76–1.27)
GFR calc Af Amer: 90 mL/min/{1.73_m2} (ref >59)
GFR calc non Af Amer: 78 mL/min/{1.73_m2} (ref >59)
Globulin, Total: 1.8 g/dL (ref 1.5–4.5)
Glucose: 96 mg/dL (ref 65–99)
Potassium: 4 mmol/L (ref 3.5–5.2)
Sodium: 144 mmol/L (ref 134–144)
Total Protein: 6 g/dL (ref 6.0–8.5)

## 2020-01-04 LAB — TSH: TSH: 1.45 u[IU]/mL (ref 0.450–4.500)

## 2020-01-04 LAB — LIPID PANEL
Chol/HDL Ratio: 3.1 ratio (ref 0.0–5.0)
Cholesterol, Total: 160 mg/dL (ref 100–199)
HDL: 52 mg/dL (ref >39)
LDL Chol Calc (NIH): 95 mg/dL (ref 0–99)
Triglycerides: 66 mg/dL (ref 0–149)
VLDL Cholesterol Cal: 13 mg/dL (ref 5–40)

## 2020-01-10 ENCOUNTER — Other Ambulatory Visit: Payer: Self-pay | Admitting: Family Medicine

## 2020-01-10 DIAGNOSIS — K219 Gastro-esophageal reflux disease without esophagitis: Secondary | ICD-10-CM

## 2020-01-12 ENCOUNTER — Other Ambulatory Visit: Payer: Self-pay | Admitting: Family Medicine

## 2020-01-13 MED ORDER — VERAPAMIL HCL ER 240 MG PO TBCR
240.0000 mg | EXTENDED_RELEASE_TABLET | Freq: Two times a day (BID) | ORAL | 0 refills | Status: DC
Start: 2020-01-13 — End: 2020-07-10

## 2020-01-19 ENCOUNTER — Other Ambulatory Visit: Payer: Self-pay

## 2020-01-19 DIAGNOSIS — E291 Testicular hypofunction: Secondary | ICD-10-CM

## 2020-01-20 ENCOUNTER — Other Ambulatory Visit: Payer: Self-pay

## 2020-01-20 ENCOUNTER — Other Ambulatory Visit: Payer: BC Managed Care – PPO

## 2020-01-20 DIAGNOSIS — E291 Testicular hypofunction: Secondary | ICD-10-CM

## 2020-01-21 ENCOUNTER — Other Ambulatory Visit: Payer: Self-pay | Admitting: Family Medicine

## 2020-01-21 LAB — TESTOSTERONE: Testosterone: 565 ng/dL (ref 264–916)

## 2020-01-21 LAB — HEMATOCRIT: Hematocrit: 43 % (ref 37.5–51.0)

## 2020-01-21 NOTE — Telephone Encounter (Signed)
Requested Prescriptions  Pending Prescriptions Disp Refills  . hydrochlorothiazide (HYDRODIURIL) 25 MG tablet [Pharmacy Med Name: hydroCHLOROthiazide 25 MG TABLET] 90 tablet 1    Sig: TAKE ONE TABLET BY MOUTH DAILY     Cardiovascular: Diuretics - Thiazide Passed - 01/21/2020  4:10 PM      Passed - Ca in normal range and within 360 days    Calcium  Date Value Ref Range Status  01/03/2020 9.2 8.6 - 10.2 mg/dL Final         Passed - Cr in normal range and within 360 days    Creatinine, Ser  Date Value Ref Range Status  01/03/2020 1.01 0.76 - 1.27 mg/dL Final         Passed - K in normal range and within 360 days    Potassium  Date Value Ref Range Status  01/03/2020 4.0 3.5 - 5.2 mmol/L Final         Passed - Na in normal range and within 360 days    Sodium  Date Value Ref Range Status  01/03/2020 144 134 - 144 mmol/L Final         Passed - Last BP in normal range    BP Readings from Last 1 Encounters:  01/02/20 137/79         Passed - Valid encounter within last 6 months    Recent Outpatient Visits          2 weeks ago Annual physical exam   [facility] Maple Hudson., MD   5 months ago Acute deep vein thrombosis (DVT) of popliteal vein of left lower extremity [facility])   [facility] Maple Hudson., MD   6 months ago Obstructive apnea   [facility] Maple Hudson., MD   1 year ago Annual physical exam   [facility] Maple Hudson., MD   2 years ago Annual physical exam   [facility] Maple Hudson., MD      Future Appointments            In 5 months Maple Hudson., MD [facility]   In 6 months Stoioff, Verna Czech, MD [facility]

## 2020-02-26 ENCOUNTER — Other Ambulatory Visit: Payer: Self-pay | Admitting: Urology

## 2020-02-26 DIAGNOSIS — E291 Testicular hypofunction: Secondary | ICD-10-CM

## 2020-03-01 ENCOUNTER — Encounter: Payer: Self-pay | Admitting: Internal Medicine

## 2020-03-01 ENCOUNTER — Ambulatory Visit (INDEPENDENT_AMBULATORY_CARE_PROVIDER_SITE_OTHER): Payer: BC Managed Care – PPO | Admitting: Internal Medicine

## 2020-03-01 ENCOUNTER — Other Ambulatory Visit: Payer: Self-pay

## 2020-03-01 VITALS — BP 160/84 | HR 80 | Temp 97.2°F | Resp 16 | Ht 72.0 in | Wt 210.8 lb

## 2020-03-01 DIAGNOSIS — J302 Other seasonal allergic rhinitis: Secondary | ICD-10-CM

## 2020-03-01 DIAGNOSIS — Z7189 Other specified counseling: Secondary | ICD-10-CM | POA: Diagnosis not present

## 2020-03-01 DIAGNOSIS — Z9989 Dependence on other enabling machines and devices: Secondary | ICD-10-CM | POA: Diagnosis not present

## 2020-03-01 DIAGNOSIS — G4733 Obstructive sleep apnea (adult) (pediatric): Secondary | ICD-10-CM | POA: Diagnosis not present

## 2020-03-01 NOTE — Progress Notes (Signed)
[place], [place] [address]  Pulmonary Sleep Medicine   Office Visit Note  Patient Name: [name] [name] DOB: 03-30-1955 MRN [id number]  Date of Service: 03/01/2020  Complaints/HPI: Patient is here for routine pulmonary follow-up Followed for OSA on CPAP therapy Has not had recent download with representative, reviewed most recent values from download obtained during today's visit 100% compliance, AHI remains well controlled 1.1, no evidence of mask leak Denies any issues or complications with CPAP Cleaning machine by hand Allergies have remained well controlled this season--no recent exacerbations or flares  ROS  General: (-) fever, (-) chills, (-) night sweats, (-) weakness Skin: (-) rashes, (-) itching,. Eyes: (-) visual changes, (-) redness, (-) itching. Nose and Sinuses: (-) nasal stuffiness or itchiness, (-) postnasal drip, (-) nosebleeds, (-) sinus trouble. Mouth and Throat: (-) sore throat, (-) hoarseness. Neck: (-) swollen glands, (-) enlarged thyroid, (-) neck pain. Respiratory: - cough, (-) bloody sputum, - shortness of breath, - wheezing. Cardiovascular: - ankle swelling, (-) chest pain. Lymphatic: (-) lymph node enlargement. Neurologic: (-) numbness, (-) tingling. Psychiatric: (-) anxiety, (-) depression   Current Medication: Outpatient Encounter Medications as of 03/01/2020  Medication Sig  . albuterol (PROAIR HFA) 108 (90 Base) MCG/ACT inhaler Inhale 1-2 puffs into the lungs every 6 (six) hours as needed for wheezing or shortness of breath.   Marland Kitchen apixaban (ELIQUIS) 2.5 MG TABS tablet Take 1 tablet (2.5 mg total) by mouth 2 (two) times daily. (Patient not taking: Reported on 01/02/2020)  . Azelastine HCl 0.15 % SOLN Place 1-2 sprays into both nostrils 2 (two) times daily.   . budesonide (PULMICORT) 1 MG/2ML nebulizer solution Inhale 1 mg into the lungs 2 (two) times daily.   . budesonide-formoterol (SYMBICORT) 160-4.5  MCG/ACT inhaler Inhale 2 puffs into the lungs 2 (two) times daily.  . Calcium Carb-Cholecalciferol (CALCIUM 600+D) 600-800 MG-UNIT TABS Take 1 tablet by mouth daily.  . calcium carbonate (TUMS - DOSED IN MG ELEMENTAL CALCIUM) 500 MG chewable tablet Chew 500 mg by mouth daily as needed for indigestion or heartburn.  . cefdinir (OMNICEF) 300 MG capsule Take 1 capsule (300 mg total) by mouth 2 (two) times daily.  . Cholecalciferol (D 2000) 2000 UNITS TABS Take 4,000 Units by mouth daily.   Marland Kitchen DEXILANT 60 MG capsule TAKE ONE CAPSULE BY MOUTH DAILY  . dextromethorphan-guaiFENesin (MUCINEX DM) 30-600 MG 12hr tablet Take 1 tablet by mouth 2 (two) times daily as needed for cough.  . dutasteride (AVODART) 0.5 MG capsule Take 1 capsule (0.5 mg total) by mouth daily.  . famotidine (PEPCID) 40 MG tablet TAKE ONE TABLET BY MOUTH DAILY (Patient taking differently: Take 40 mg by mouth at bedtime. )  . hydrochlorothiazide (HYDRODIURIL) 25 MG tablet TAKE ONE TABLET BY MOUTH DAILY  . ketotifen (ALAWAY) 0.025 % ophthalmic solution Place 1 drop into both eyes daily.   Marland Kitchen losartan (COZAAR) 100 MG tablet TAKE ONE TABLET BY MOUTH DAILY  . Misc Natural Products (GLUCOSAMINE CHONDROITIN TRIPLE) TABS Take 1 tablet by mouth 2 (two) times daily.  . Multiple Vitamin (MULTI-VITAMINS) TABS Take 1 tablet by mouth daily.   . Multiple Vitamins-Minerals (PRESERVISION AREDS 2) CAPS Take 1 capsule by mouth 2 (two) times daily.  Marland Kitchen oxyCODONE (ROXICODONE) 5 MG immediate release tablet Take 1-2 tablets (5-10 mg total) by mouth every 4 (four) hours as needed for moderate pain or severe pain.  Norvel Richards 80 MCG/ACT AERS Place 1 spray into both nostrils daily.   Marland Kitchen  SPIRIVA RESPIMAT 1.25 MCG/ACT AERS SMARTSIG:2 Puff(s) Via Inhaler Daily  . tadalafil (CIALIS) 5 MG tablet Take 1 tablet (5 mg total) by mouth at bedtime.  . tamsulosin (FLOMAX) 0.4 MG CAPS capsule Take 1 capsule (0.4 mg total) by mouth daily.  . Testosterone 30 MG/ACT SOLN APPLY ONE  SWIPE TO EACH AXILA DAILY  . Turmeric 500 MG CAPS Take 500 mg by mouth 2 (two) times daily.   . verapamil (CALAN-SR) 240 MG CR tablet Take 1 tablet (240 mg total) by mouth 2 (two) times daily.   No facility-administered encounter medications on file as of 03/01/2020.    Surgical History: Past Surgical History:  Procedure Laterality Date  . APPENDECTOMY  1990   Dr Bary Castilla  . COLONOSCOPY  2008   Dr Bary Castilla  . COLONOSCOPY WITH PROPOFOL N/A 08/13/2016   Procedure: COLONOSCOPY WITH PROPOFOL;  Surgeon: Robert Bellow, MD;  Location: [facility] ENDOSCOPY;  Service: Endoscopy;  Laterality: N/A;  . HEMORRHOID SURGERY    . HERNIA REPAIR  19/75/88   umbilical hernia  . KNEE ARTHROSCOPY Left   . KNEE SURGERY Left 1986  . NASAL SEPTUM SURGERY    . TONSILLECTOMY    . TOTAL KNEE ARTHROPLASTY Left 03/29/2019   Procedure: TOTAL KNEE ARTHROPLASTY;  Surgeon: Corky Mull, MD;  Location: [facility] ORS;  Service: Orthopedics;  Laterality: Left;  Marland Kitchen VASECTOMY      Medical History: Past Medical History:  Diagnosis Date  . Allergy   . Asthma   . BP (high blood pressure) 11/07/2014   Should stop combination of [place] (DRI)  and ARB   . Hypertension   . HYPERTENSION, SEVERE 03/15/2007   Qualifier: Diagnosis of  By: Tilden Dome    . Prostate enlargement   . Sleep apnea     Family History: Family History  Problem Relation Age of Onset  . Cancer Mother        ovarian  . Glaucoma Mother   . Cancer Father        prostate  . Hypertension Sister   . Asthma Sister   . Alcohol abuse Paternal Uncle   . Liver disease Paternal Uncle   . Cancer - Prostate Paternal Uncle   . Cancer Maternal Uncle        lung; two mat uncles  . Cancer Paternal Grandmother        breast  . Cancer Paternal Aunt        lung cancer in a smoker    Social History: Social History   Socioeconomic History  . Marital status: Married    Spouse name: Not on file  . Number of children: Not on file  . Years of education: Not  on file  . Highest education level: Not on file  Occupational History  . Not on file  Tobacco Use  . Smoking status: Never Smoker  . Smokeless tobacco: Never Used  . Tobacco comment: Exposed to second hand smoke as a child  Substance and Sexual Activity  . Alcohol use: Yes    Alcohol/week: 0.0 standard drinks    Comment: occasionally  . Drug use: No  . Sexual activity: Not on file  Other Topics Concern  . Not on file  Social History Narrative  . Not on file   Social Determinants of Health   Financial Resource Strain: Not on file  Food Insecurity: Not on file  Transportation Needs: Not on file  Physical Activity: Not on file  Stress: Not on file  Social Connections: Not on file  Intimate Partner Violence: Not on file    Vital Signs: Blood pressure (!) 160/84, pulse 80, temperature (!) 97.2 F (36.2 C), resp. rate 16, height 6' (1.829 m), weight 210 lb 12.8 oz (95.6 kg), SpO2 99 %.  Examination: General Appearance: The patient is well-developed, well-nourished, and in no distress. Skin: Gross inspection of skin unremarkable. Head: normocephalic, no gross deformities. Eyes: no gross deformities noted. ENT: ears appear grossly normal no exudates. Neck: Supple. No thyromegaly. No LAD. Respiratory: Clear throughout, no rhonchi, rales or wheezing noted. Cardiovascular: Normal S1 and S2 without murmur or rub. Extremities: No cyanosis. pulses are equal. Neurologic: Alert and oriented. No involuntary movements.  LABS: Recent Results (from the past 2160 hour(s))  Lipid panel     Status: None   Collection Time: 01/03/20  8:23 AM  Result Value Ref Range   Cholesterol, Total 160 100 - 199 mg/dL   Triglycerides 66 0 - 149 mg/dL   HDL 52 >39 mg/dL   VLDL Cholesterol Cal 13 5 - 40 mg/dL   LDL Chol Calc (NIH) 95 0 - 99 mg/dL   Chol/HDL Ratio 3.1 0.0 - 5.0 ratio    Comment:                                   T. Chol/HDL Ratio                                             Men   Women                               1/2 Avg.Risk  3.4    3.3                                   Avg.Risk  5.0    4.4                                2X Avg.Risk  9.6    7.1                                3X Avg.Risk 23.4   11.0   CBC w/Diff/Platelet     Status: None   Collection Time: 01/03/20  8:23 AM  Result Value Ref Range   WBC 7.3 3.4 - 10.8 x10E3/uL   RBC 4.19 4.14 - 5.80 x10E6/uL   Hemoglobin 13.6 13.0 - 17.7 g/dL   Hematocrit 39.7 37.5 - 51.0 %   MCV 95 79 - 97 fL   MCH 32.5 26.6 - 33.0 pg   MCHC 34.3 31.5 - 35.7 g/dL   RDW 12.2 11.6 - 15.4 %   Platelets 191 150 - 450 x10E3/uL   Neutrophils 77 Not Estab. %   Lymphs 15 Not Estab. %   Monocytes 7 Not Estab. %   Eos 1 Not Estab. %   Basos 0 Not Estab. %   Neutrophils Absolute 5.6 1.4 - 7.0 x10E3/uL   Lymphocytes Absolute 1.1 0.7 - 3.1 x10E3/uL   Monocytes Absolute 0.5  0.1 - 0.9 x10E3/uL   EOS (ABSOLUTE) 0.0 0.0 - 0.4 x10E3/uL   Basophils Absolute 0.0 0.0 - 0.2 x10E3/uL   Immature Granulocytes 0 Not Estab. %   Immature Grans (Abs) 0.0 0.0 - 0.1 x10E3/uL  Comprehensive Metabolic Panel (CMET)     Status: Abnormal   Collection Time: 01/03/20  8:23 AM  Result Value Ref Range   Glucose 96 65 - 99 mg/dL   BUN 10 8 - 27 mg/dL   Creatinine, Ser 1.01 0.76 - 1.27 mg/dL   GFR calc non Af Amer 78 >59 mL/min/1.73   GFR calc Af Amer 90 >59 mL/min/1.73    Comment: **In accordance with recommendations from the NKF-ASN Task force,**   [facility] is in the process of updating its eGFR calculation to the   2021 CKD-EPI creatinine equation that estimates kidney function   without a race variable.    BUN/Creatinine Ratio 10 10 - 24   Sodium 144 134 - 144 mmol/L   Potassium 4.0 3.5 - 5.2 mmol/L   Chloride 104 96 - 106 mmol/L   CO2 26 20 - 29 mmol/L   Calcium 9.2 8.6 - 10.2 mg/dL   Total Protein 6.0 6.0 - 8.5 g/dL   Albumin 4.2 3.8 - 4.8 g/dL   Globulin, Total 1.8 1.5 - 4.5 g/dL   Albumin/Globulin Ratio 2.3 (H) 1.2 - 2.2   Bilirubin  Total 0.7 0.0 - 1.2 mg/dL   Alkaline Phosphatase 78 44 - 121 IU/L    Comment:               **Please note reference interval change**   AST 9 0 - 40 IU/L   ALT 11 0 - 44 IU/L  TSH     Status: None   Collection Time: 01/03/20  8:23 AM  Result Value Ref Range   TSH 1.450 0.450 - 4.500 uIU/mL  Hematocrit     Status: None   Collection Time: 01/20/20 11:11 AM  Result Value Ref Range   Hematocrit 43.0 37.5 - 51.0 %  Testosterone     Status: None   Collection Time: 01/20/20 11:11 AM  Result Value Ref Range   Testosterone 565 264 - 916 ng/dL    Comment: Adult male reference interval is based on a population of healthy nonobese males (BMI <30) between 78 and 44 years old. [place], [place] [phone]. PMID: 75916384.     Radiology: No results found.  No results found.  No results found.    Assessment and Plan: Patient Active Problem List   Diagnosis Date Noted  . Erectile dysfunction due to arterial insufficiency 07/15/2018  . Synovitis of elbow 08/17/2017  . Thrombosed external hemorrhoid 05/10/2015  . Allergic rhinitis 11/07/2014  . Arthralgia of hand 11/07/2014  . Airway hyperreactivity 11/07/2014  . Benign prostatic hyperplasia with lower urinary tract symptoms 11/07/2014  . Impotence of organic origin 11/07/2014  . Acid reflux 11/07/2014  . BP (high blood pressure) 11/07/2014  . Hypogonadism in male 11/07/2014  . Obstructive apnea 11/07/2014  . Plantar fasciitis 11/07/2014  . 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 Testicular hypofunction 10/26/2013  . Cellulitis 10/26/2013  . Essential (primary) hypertension 10/26/2013  . Other specified counseling 10/26/2013  . Febrile 10/26/2013  . Pain in soft tissues of limb 10/26/2013  . Palpitations 10/26/2013  . Personal history of perinatal problems 10/26/2013  . Encounter for screening for other viral diseases 09/29/2012  . ASTHMATIC BRONCHITIS, ACUTE 03/25/2007  . HYPERTENSION,  SEVERE 03/15/2007  .  RHINITIS 03/15/2007  . Asthma 03/15/2007  . ACID REFLUX DISEASE 03/15/2007  . COUGH, CHRONIC 03/15/2007    1. OSA on CPAP Continue with nightly CPAP use  2. CPAP use counseling Discussed importance of adequate CPAP use as well as proper care and cleaning techniques of machine and all supplies.  3. Seasonal allergies Symptoms remain stable at this time, continue current management, will continue to montior  General Counseling: I have discussed the findings of the evaluation and examination with [name] [name].  I have also discussed any further diagnostic evaluation thatmay be needed or ordered today. [name] [name] verbalizes understanding of the findings of todays visit. We also reviewed his medications today and discussed drug interactions and side effects including but not limited excessive drowsiness and altered mental states. We also discussed that there is always a risk not just to him but also people around him. he has been encouraged to call the office with any questions or concerns that should arise related to todays visit.     Time spent: 30  I have personally obtained a history, examined the patient, evaluated laboratory and imaging results, formulated the assessment and plan and placed orders. This patient was seen by Casey Burkitt AGNP-C in Collaboration with Dr. Devona Konig as a part of collaborative care agreement.    Allyne Gee, MD [facility] Pulmonary and Critical Care Sleep medicine

## 2020-03-09 ENCOUNTER — Encounter: Payer: Self-pay | Admitting: Internal Medicine

## 2020-04-07 ENCOUNTER — Other Ambulatory Visit: Payer: Self-pay | Admitting: Family Medicine

## 2020-04-07 DIAGNOSIS — I10 Essential (primary) hypertension: Secondary | ICD-10-CM

## 2020-04-19 ENCOUNTER — Ambulatory Visit: Payer: Self-pay | Admitting: *Deleted

## 2020-04-19 DIAGNOSIS — M79605 Pain in left leg: Secondary | ICD-10-CM

## 2020-04-19 DIAGNOSIS — Z86718 Personal history of other venous thrombosis and embolism: Secondary | ICD-10-CM

## 2020-04-19 DIAGNOSIS — M7989 Other specified soft tissue disorders: Secondary | ICD-10-CM

## 2020-04-19 NOTE — Telephone Encounter (Signed)
C/o  Left ankle hurting since yesterday evening. Slight swelling and redness and pain to touch noted. Patient reports wearing compression socks today to see if that would help with the pain. Hx blood clot before. Denies difficulty breathing, fever, foot cool to touch. C/o pain walking and requires to limp. No available appt at this time. Encouraged patient to go to [facility] or ED. Patient reports he has been to [facility] clinic in the past for ultrasound by PCP and would like to see if that is possible again instead of required ED visit. Care advise given. Patient verbalized understanding of care advise and to call back or go to ED if symptoms worsen.   Reason for Disposition . [1] Redness of the skin AND [2] no fever  Answer Assessment - Initial Assessment Questions 1. ONSET: "When did the pain start?"      Yesterday evening  2. LOCATION: "Where is the pain located?"      Left ankle  3. PAIN: "How bad is the pain?"    (Scale 1-10; or mild, moderate, severe)  - MILD (1-3): doesn't interfere with normal activities.   - MODERATE (4-7): interferes with normal activities (e.g., work or school) or awakens from sleep, limping.   - SEVERE (8-10): excruciating pain, unable to do any normal activities, unable to walk.      Moderate  4. WORK OR EXERCISE: "Has there been any recent work or exercise that involved this part of the body?"      No  5. CAUSE: "What do you think is causing the ankle pain?"     Unsure  6. OTHER SYMPTOMS: "Do you have any other symptoms?" (e.g., calf pain, rash, fever, swelling)     Redness , slight swelling, pain to touch  7. PREGNANCY: "Is there any chance you are pregnant?" "When was your last menstrual period?"     na  Protocols used: ANKLE PAIN-A-AH

## 2020-04-20 ENCOUNTER — Ambulatory Visit
Admission: RE | Admit: 2020-04-20 | Discharge: 2020-04-20 | Disposition: A | Discharge status: Home / Self Care | Payer: BC Managed Care – PPO | Source: Ambulatory Visit | Attending: Family Medicine | Admitting: Family Medicine

## 2020-04-20 ENCOUNTER — Other Ambulatory Visit: Payer: Self-pay

## 2020-04-20 DIAGNOSIS — Z86718 Personal history of other venous thrombosis and embolism: Secondary | ICD-10-CM | POA: Insufficient documentation

## 2020-04-20 DIAGNOSIS — M79605 Pain in left leg: Secondary | ICD-10-CM | POA: Diagnosis present

## 2020-04-20 DIAGNOSIS — M7989 Other specified soft tissue disorders: Secondary | ICD-10-CM | POA: Insufficient documentation

## 2020-04-23 ENCOUNTER — Encounter: Payer: Self-pay | Admitting: Family Medicine

## 2020-04-23 ENCOUNTER — Ambulatory Visit
Admission: RE | Admit: 2020-04-23 | Discharge: 2020-04-23 | Disposition: A | Discharge status: Home / Self Care | Payer: BC Managed Care – PPO | Source: Ambulatory Visit | Attending: Family Medicine | Admitting: Family Medicine

## 2020-04-23 ENCOUNTER — Other Ambulatory Visit: Payer: Self-pay

## 2020-04-23 ENCOUNTER — Ambulatory Visit: Payer: BC Managed Care – PPO | Admitting: Family Medicine

## 2020-04-23 VITALS — BP 147/76 | HR 71 | Temp 98.2°F | Ht 72.0 in | Wt 217.0 lb

## 2020-04-23 DIAGNOSIS — M25572 Pain in left ankle and joints of left foot: Secondary | ICD-10-CM

## 2020-04-23 DIAGNOSIS — M109 Gout, unspecified: Secondary | ICD-10-CM | POA: Diagnosis not present

## 2020-04-23 MED ORDER — COLCHICINE 0.6 MG PO TABS: 0.6000 mg | ORAL_TABLET | Freq: Two times a day (BID) | ORAL | 0 refills | Status: DC

## 2020-04-23 NOTE — Patient Instructions (Addendum)
Take 1/2 tablet of Verapamil while taking colchicine 0.6mg .

## 2020-04-23 NOTE — Progress Notes (Signed)
Established patient visit   Patient: [name] [name]   DOB: 1956/01/07   65 y.o. Male  MRN: [id number] Visit Date: 04/23/2020  Today's healthcare provider: Megan Mans, MD   Chief Complaint  Patient presents with  . Leg Pain   Subjective    Leg Pain  There was no injury mechanism. The pain is present in the left leg and left ankle. The pain is moderate. The pain has been constant since onset. Associated symptoms include an inability to bear weight. Pertinent negatives include no numbness. He reports no foreign bodies present. The symptoms are aggravated by movement and palpation. He has tried nothing for the symptoms.  Pain started about 2 weeks ago.  No known trauma.  It seemed to get worse about 5 days ago.  The pain is in the area of the left lateral malleolus and is sensitive even to light touch.  No fever and minimal swelling.  It really hurts to bear weight. Patient feels that he may have a DVT. He reports that he had one last year, and he is having the same symptoms.       Medications: Outpatient Medications Prior to Visit  Medication Sig  . albuterol (PROAIR HFA) 108 (90 Base) MCG/ACT inhaler Inhale 1-2 puffs into the lungs every 6 (six) hours as needed for wheezing or shortness of breath.   Marland Kitchen apixaban (ELIQUIS) 2.5 MG TABS tablet Take 1 tablet (2.5 mg total) by mouth 2 (two) times daily. (Patient not taking: Reported on 01/02/2020)  . Azelastine HCl 0.15 % SOLN Place 1-2 sprays into both nostrils 2 (two) times daily.   . budesonide (PULMICORT) 1 MG/2ML nebulizer solution Inhale 1 mg into the lungs 2 (two) times daily.   . budesonide-formoterol (SYMBICORT) 160-4.5 MCG/ACT inhaler Inhale 2 puffs into the lungs 2 (two) times daily.  . Calcium Carb-Cholecalciferol (CALCIUM 600+D) 600-800 MG-UNIT TABS Take 1 tablet by mouth daily.  . calcium carbonate (TUMS - DOSED IN MG ELEMENTAL CALCIUM) 500 MG chewable tablet Chew 500 mg by mouth daily as needed for  indigestion or heartburn.  . cefdinir (OMNICEF) 300 MG capsule Take 1 capsule (300 mg total) by mouth 2 (two) times daily.  . Cholecalciferol (D 2000) 2000 UNITS TABS Take 4,000 Units by mouth daily.   Marland Kitchen DEXILANT 60 MG capsule TAKE ONE CAPSULE BY MOUTH DAILY  . dextromethorphan-guaiFENesin (MUCINEX DM) 30-600 MG 12hr tablet Take 1 tablet by mouth 2 (two) times daily as needed for cough.  . dutasteride (AVODART) 0.5 MG capsule Take 1 capsule (0.5 mg total) by mouth daily.  . famotidine (PEPCID) 40 MG tablet TAKE ONE TABLET BY MOUTH DAILY (Patient taking differently: Take 40 mg by mouth at bedtime. )  . hydrochlorothiazide (HYDRODIURIL) 25 MG tablet TAKE ONE TABLET BY MOUTH DAILY  . ketotifen (ALAWAY) 0.025 % ophthalmic solution Place 1 drop into both eyes daily.   Marland Kitchen losartan (COZAAR) 100 MG tablet TAKE ONE TABLET BY MOUTH DAILY  . Misc Natural Products (GLUCOSAMINE CHONDROITIN TRIPLE) TABS Take 1 tablet by mouth 2 (two) times daily.  . Multiple Vitamin (MULTI-VITAMINS) TABS Take 1 tablet by mouth daily.   . Multiple Vitamins-Minerals (PRESERVISION AREDS 2) CAPS Take 1 capsule by mouth 2 (two) times daily.  Marland Kitchen oxyCODONE (ROXICODONE) 5 MG immediate release tablet Take 1-2 tablets (5-10 mg total) by mouth every 4 (four) hours as needed for moderate pain or severe pain.  Illa Level 80 MCG/ACT AERS Place 1 spray into both nostrils  daily.   Marland Kitchen SPIRIVA RESPIMAT 1.25 MCG/ACT AERS SMARTSIG:2 Puff(s) Via Inhaler Daily  . tadalafil (CIALIS) 5 MG tablet Take 1 tablet (5 mg total) by mouth at bedtime.  . tamsulosin (FLOMAX) 0.4 MG CAPS capsule Take 1 capsule (0.4 mg total) by mouth daily.  . Testosterone 30 MG/ACT SOLN APPLY ONE SWIPE TO EACH AXILA DAILY  . Turmeric 500 MG CAPS Take 500 mg by mouth 2 (two) times daily.   . verapamil (CALAN-SR) 240 MG CR tablet Take 1 tablet (240 mg total) by mouth 2 (two) times daily.   No facility-administered medications prior to visit.    Review of Systems  Constitutional:  Negative.   Cardiovascular: Negative for leg swelling.  Musculoskeletal: Positive for arthralgias and myalgias.  Neurological: Negative for dizziness, weakness, numbness and headaches.        Objective    BP (!) 147/76   Pulse 71   Temp 98.2 F (36.8 C)   Ht 6' (1.829 m)   Wt 217 lb (98.4 kg)   BMI 29.43 kg/m  BP Readings from Last 3 Encounters:  04/23/20 (!) 147/76  03/01/20 (!) 160/84  01/02/20 137/79   Wt Readings from Last 3 Encounters:  04/23/20 217 lb (98.4 kg)  03/01/20 210 lb 12.8 oz (95.6 kg)  01/02/20 213 lb (96.6 kg)       Physical Exam Vitals reviewed.  Constitutional:      Appearance: Normal appearance.  Musculoskeletal:        General: Tenderness present.     Comments: He has trace ankle and pedal edema but nothing above the ankle to suggest any calf swelling or DVT. Dopplers few days ago were negative. No warmth to the touch he does have tenderness directly over the lateral left lateral malleolus and just inferior and anterior to this.  There is no significant erythema or induration  Skin:    General: Skin is warm and dry.  Neurological:     General: No focal deficit present.     Mental Status: He is alert and oriented to person, place, and time.  Psychiatric:        Mood and Affect: Mood normal.        Behavior: Behavior normal.        Thought Content: Thought content normal.        Judgment: Judgment normal.       No results found for any visits on 04/23/20.  Assessment & Plan     1. Acute left ankle pain Patient denies any known trauma but due to significant pain to direct palpation will x-ray ankle. - DG Ankle Complete Left; Future  2. Gout, unspecified cause, unspecified chronicity, unspecified site Clinically this most likely is gout but he has minimal physical findings.  Work-up and treatment as below.  Follow-up if he worsens.  May stop the colchicine once this flare is resolved - CBC with Differential/Platelet - Sedimentation  rate - Uric acid - colchicine 0.6 MG tablet; Take 1 tablet (0.6 mg total) by mouth 2 (two) times daily.  Dispense: 60 tablet; Refill: 0   No follow-ups on file.      I, Megan Mans, MD, have reviewed all documentation for this visit. The documentation on 04/26/20 for the exam, diagnosis, procedures, and orders are all accurate and complete.    Ashlie Mcmenamy Wendelyn Breslow, MD  [facility] [phone] (phone) [phone] (fax)  [facility]

## 2020-04-24 ENCOUNTER — Ambulatory Visit: Payer: Self-pay | Admitting: Family Medicine

## 2020-04-24 ENCOUNTER — Telehealth: Payer: Self-pay | Admitting: Family Medicine

## 2020-04-24 ENCOUNTER — Other Ambulatory Visit: Payer: Self-pay | Admitting: *Deleted

## 2020-04-24 DIAGNOSIS — M25572 Pain in left ankle and joints of left foot: Secondary | ICD-10-CM

## 2020-04-24 DIAGNOSIS — S82832A Other fracture of upper and lower end of left fibula, initial encounter for closed fracture: Secondary | ICD-10-CM

## 2020-04-24 LAB — CBC WITH DIFFERENTIAL/PLATELET
Basophils Absolute: 0 10*3/uL (ref 0.0–0.2)
Basos: 0 %
EOS (ABSOLUTE): 0 10*3/uL (ref 0.0–0.4)
Eos: 0 %
Hematocrit: 40.1 % (ref 37.5–51.0)
Hemoglobin: 13.7 g/dL (ref 13.0–17.7)
Immature Grans (Abs): 0 10*3/uL (ref 0.0–0.1)
Immature Granulocytes: 0 %
Lymphocytes Absolute: 1.2 10*3/uL (ref 0.7–3.1)
Lymphs: 17 %
MCH: 32.1 pg (ref 26.6–33.0)
MCHC: 34.2 g/dL (ref 31.5–35.7)
MCV: 94 fL (ref 79–97)
Monocytes Absolute: 0.4 10*3/uL (ref 0.1–0.9)
Monocytes: 6 %
Neutrophils Absolute: 5.2 10*3/uL (ref 1.4–7.0)
Neutrophils: 77 %
Platelets: 204 10*3/uL (ref 150–450)
RBC: 4.27 x10E6/uL (ref 4.14–5.80)
RDW: 12.8 % (ref 11.6–15.4)
WBC: 6.8 10*3/uL (ref 3.4–10.8)

## 2020-04-24 LAB — URIC ACID: Uric Acid: 4.7 mg/dL (ref 3.8–8.4)

## 2020-04-24 LAB — SEDIMENTATION RATE: Sed Rate: 4 mm/hr (ref 0–30)

## 2020-04-24 NOTE — Telephone Encounter (Signed)
Please advise? Patient is aware of results.

## 2020-04-24 NOTE — Telephone Encounter (Signed)
Pt states his xray of the ankle shows a non displaced fracture of the distal left fibula . Pt wants to know what Dr Sullivan Lone plan of action will be. If he is going to refer him to an orthopedic He prefers to see Dr Joice Lofts at [facility].  Pt states his labs show no gout, so he should stop gout medication?

## 2020-04-25 NOTE — Telephone Encounter (Signed)
That is almost certainly all he will need.

## 2020-04-25 NOTE — Telephone Encounter (Signed)
Patient was advised. Patient stated he went to [facility] because Dr. Binnie Rail office is three weeks out on appointments. Patient stated [facility] has already put a boot on his foot.

## 2020-04-25 NOTE — Telephone Encounter (Signed)
Would definitely refer to Dr. Joice Lofts then at [facility]

## 2020-05-08 ENCOUNTER — Ambulatory Visit: Payer: Self-pay | Admitting: Family Medicine

## 2020-05-18 ENCOUNTER — Telehealth: Payer: Self-pay

## 2020-05-18 DIAGNOSIS — E291 Testicular hypofunction: Secondary | ICD-10-CM

## 2020-05-18 MED ORDER — TESTOSTERONE 30 MG/ACT TD SOLN
TRANSDERMAL | 2 refills | Status: DC
Start: 2020-05-18 — End: 2020-12-31

## 2020-05-18 NOTE — Telephone Encounter (Signed)
Incoming call from pt on triage line, he states that he has transferred all of his prescriptions from [facility] to [facility] and needs his Testosterone RX sent into them, pharmacist could not transfer as it is a controlled substance.

## 2020-06-21 ENCOUNTER — Telehealth: Payer: Self-pay | Admitting: Family Medicine

## 2020-06-21 DIAGNOSIS — K219 Gastro-esophageal reflux disease without esophagitis: Secondary | ICD-10-CM

## 2020-06-21 MED ORDER — DEXLANSOPRAZOLE 60 MG PO CPDR: 1.0000 | DELAYED_RELEASE_CAPSULE | Freq: Every day | ORAL | 1 refills | Status: DC

## 2020-06-21 NOTE — Telephone Encounter (Signed)
Medication sent into the pharmacy. 

## 2020-06-21 NOTE — Telephone Encounter (Signed)
[facility] faxed refill request for the following medications:  DEXILANT 60 MG capsule  Please advise. Thanks TNP

## 2020-06-26 ENCOUNTER — Other Ambulatory Visit: Payer: Self-pay | Admitting: *Deleted

## 2020-06-26 DIAGNOSIS — N401 Enlarged prostate with lower urinary tract symptoms: Secondary | ICD-10-CM

## 2020-06-26 DIAGNOSIS — E291 Testicular hypofunction: Secondary | ICD-10-CM

## 2020-07-02 ENCOUNTER — Ambulatory Visit: Payer: BC Managed Care – PPO | Admitting: Family Medicine

## 2020-07-02 ENCOUNTER — Other Ambulatory Visit: Payer: Self-pay

## 2020-07-02 ENCOUNTER — Encounter: Payer: Self-pay | Admitting: Family Medicine

## 2020-07-02 VITALS — BP 137/66 | HR 74 | Temp 97.9°F | Resp 16 | Ht 72.0 in | Wt 221.0 lb

## 2020-07-02 DIAGNOSIS — K219 Gastro-esophageal reflux disease without esophagitis: Secondary | ICD-10-CM | POA: Diagnosis not present

## 2020-07-02 DIAGNOSIS — J310 Chronic rhinitis: Secondary | ICD-10-CM

## 2020-07-02 DIAGNOSIS — J4520 Mild intermittent asthma, uncomplicated: Secondary | ICD-10-CM

## 2020-07-02 DIAGNOSIS — I10 Essential (primary) hypertension: Secondary | ICD-10-CM | POA: Diagnosis not present

## 2020-07-02 DIAGNOSIS — R059 Cough, unspecified: Secondary | ICD-10-CM

## 2020-07-02 NOTE — Progress Notes (Signed)
Established patient visit   Patient: [name] [name]   DOB: 1955/06/27   65 y.o. Male  MRN: [id number] Visit Date: 07/02/2020  Today's healthcare provider: Megan Mans, MD   Chief Complaint  Patient presents with  . Hypertension   Subjective    HPI  Overall patient is feeling well. He has plans for a complete ENT GI and pulmonary work-up for his cough and reflux.  This will be done at [facility].  He plans to retire in about a year. Hypertension, follow-up  BP Readings from Last 3 Encounters:  07/02/20 137/66  04/23/20 (!) 147/76  03/01/20 (!) 160/84   Wt Readings from Last 3 Encounters:  07/02/20 221 lb (100.2 kg)  04/23/20 217 lb (98.4 kg)  03/01/20 210 lb 12.8 oz (95.6 kg)     He was last seen for hypertension 6 months ago.  BP at that visit was 137/79. Management since that visit includes; on losartan and HCTZ. He reports good compliance with treatment. He is not having side effects.  He is not exercising. He is adherent to low salt diet.   Outside blood pressures are checked occasionally.  He does not smoke.  Use of agents associated with hypertension: none.       Medications: Outpatient Medications Prior to Visit  Medication Sig  . albuterol (PROAIR HFA) 108 (90 Base) MCG/ACT inhaler Inhale 1-2 puffs into the lungs every 6 (six) hours as needed for wheezing or shortness of breath.   . Azelastine HCl 0.15 % SOLN Place 1-2 sprays into both nostrils 2 (two) times daily.   . budesonide (PULMICORT) 1 MG/2ML nebulizer solution Inhale 1 mg into the lungs 2 (two) times daily.   . budesonide-formoterol (SYMBICORT) 160-4.5 MCG/ACT inhaler Inhale 2 puffs into the lungs 2 (two) times daily.  . Calcium Carb-Cholecalciferol (CALCIUM 600+D) 600-800 MG-UNIT TABS Take 1 tablet by mouth daily.  . calcium carbonate (TUMS - DOSED IN MG ELEMENTAL CALCIUM) 500 MG chewable tablet Chew 500 mg by mouth daily as needed for indigestion or heartburn.  . cefdinir  (OMNICEF) 300 MG capsule Take 1 capsule (300 mg total) by mouth 2 (two) times daily.  . Cholecalciferol (D 2000) 2000 UNITS TABS Take 4,000 Units by mouth daily.   . colchicine 0.6 MG tablet Take 1 tablet (0.6 mg total) by mouth 2 (two) times daily.  Marland Kitchen dexlansoprazole (DEXILANT) 60 MG capsule Take 1 capsule (60 mg total) by mouth daily.  Marland Kitchen dextromethorphan-guaiFENesin (MUCINEX DM) 30-600 MG 12hr tablet Take 1 tablet by mouth 2 (two) times daily as needed for cough.  . dutasteride (AVODART) 0.5 MG capsule Take 1 capsule (0.5 mg total) by mouth daily.  . famotidine (PEPCID) 40 MG tablet TAKE ONE TABLET BY MOUTH DAILY (Patient taking differently: Take 40 mg by mouth at bedtime. )  . hydrochlorothiazide (HYDRODIURIL) 25 MG tablet TAKE ONE TABLET BY MOUTH DAILY  . ketotifen (ALAWAY) 0.025 % ophthalmic solution Place 1 drop into both eyes daily.   Marland Kitchen losartan (COZAAR) 100 MG tablet TAKE ONE TABLET BY MOUTH DAILY  . Misc Natural Products (GLUCOSAMINE CHONDROITIN TRIPLE) TABS Take 1 tablet by mouth 2 (two) times daily.  . Multiple Vitamin (MULTI-VITAMINS) TABS Take 1 tablet by mouth daily.   . Multiple Vitamins-Minerals (PRESERVISION AREDS 2) CAPS Take 1 capsule by mouth 2 (two) times daily.  Illa Level 80 MCG/ACT AERS Place 1 spray into both nostrils daily.   Marland Kitchen SPIRIVA RESPIMAT 1.25 MCG/ACT AERS SMARTSIG:2 Puff(s) Via Inhaler  Daily  . tadalafil (CIALIS) 5 MG tablet Take 1 tablet (5 mg total) by mouth at bedtime.  . tamsulosin (FLOMAX) 0.4 MG CAPS capsule Take 1 capsule (0.4 mg total) by mouth daily.  . Testosterone 30 MG/ACT SOLN APPLY ONE SWIPE TO EACH AXILA DAILY  . Turmeric 500 MG CAPS Take 500 mg by mouth 2 (two) times daily.   . verapamil (CALAN-SR) 240 MG CR tablet Take 1 tablet (240 mg total) by mouth 2 (two) times daily.   No facility-administered medications prior to visit.    Review of Systems  Constitutional: Negative for appetite change, chills and fever.  Respiratory: Negative for chest  tightness, shortness of breath and wheezing.   Cardiovascular: Negative for chest pain and palpitations.  Gastrointestinal: Negative for abdominal pain, nausea and vomiting.        Objective    BP 137/66   Pulse 74   Temp 97.9 F (36.6 C)   Resp 16   Ht 6' (1.829 m)   Wt 221 lb (100.2 kg)   BMI 29.97 kg/m  BP Readings from Last 3 Encounters:  07/02/20 137/66  04/23/20 (!) 147/76  03/01/20 (!) 160/84   Wt Readings from Last 3 Encounters:  07/02/20 221 lb (100.2 kg)  04/23/20 217 lb (98.4 kg)  03/01/20 210 lb 12.8 oz (95.6 kg)       Physical Exam Vitals reviewed.  Constitutional:      Appearance: He is well-developed.     Comments: Mild  trunkal obesity  HENT:     Head: Normocephalic and atraumatic.     Right Ear: External ear normal.     Left Ear: External ear normal.     Nose: Nose normal.  Eyes:     General: No scleral icterus.    Conjunctiva/sclera: Conjunctivae normal.     Pupils: Pupils are equal, round, and reactive to light.  Neck:     Thyroid: No thyromegaly.  Cardiovascular:     Rate and Rhythm: Normal rate and regular rhythm.     Heart sounds: Normal heart sounds.  Pulmonary:     Effort: Pulmonary effort is normal.     Breath sounds: Normal breath sounds.  Abdominal:     Palpations: Abdomen is soft.  Lymphadenopathy:     Cervical: No cervical adenopathy.  Skin:    General: Skin is warm and dry.  Neurological:     General: No focal deficit present.     Mental Status: He is alert and oriented to person, place, and time.  Psychiatric:        Behavior: Behavior normal.        Thought Content: Thought content normal.        Judgment: Judgment normal.       No results found for any visits on 07/02/20.  Assessment & Plan     1. Essential (primary) hypertension Good control.  No changes in care Follow-up in 6 months for welcome to Medicare visit. 2. Mild intermittent asthma, unspecified whether complicated   3. RHINITIS   4.  Gastroesophageal reflux disease, unspecified whether esophagitis present   5. COUGH, CHRONIC Work-up for the last 3 problems being done at [facility].   No follow-ups on file.      I, Megan Mans, MD, have reviewed all documentation for this visit. The documentation on 07/07/20 for the exam, diagnosis, procedures, and orders are all accurate and complete.    Megan Mans, MD  [facility] [phone] (phone) [phone] (fax)  [place]

## 2020-07-10 ENCOUNTER — Telehealth: Payer: Self-pay

## 2020-07-10 DIAGNOSIS — I10 Essential (primary) hypertension: Secondary | ICD-10-CM

## 2020-07-10 MED ORDER — VERAPAMIL HCL ER 240 MG PO TBCR: 240.0000 mg | EXTENDED_RELEASE_TABLET | Freq: Two times a day (BID) | ORAL | 1 refills | Status: DC

## 2020-07-10 NOTE — Telephone Encounter (Signed)
[facility] faxed refill request for the following medications:  verapamil (CALAN-SR) 240 MG CR tablet   Please advise.

## 2020-07-10 NOTE — Telephone Encounter (Signed)
Rx sent to pharmacy   

## 2020-07-16 ENCOUNTER — Telehealth: Payer: Self-pay | Admitting: Family Medicine

## 2020-07-16 MED ORDER — HYDROCHLOROTHIAZIDE 25 MG PO TABS: 1.0000 | ORAL_TABLET | Freq: Every day | ORAL | 1 refills | Status: DC

## 2020-07-16 NOTE — Telephone Encounter (Signed)
[facility] faxed refill request for the following medications:  hydrochlorothiazide (HYDRODIURIL) 25 MG tablet  Last Rx: 01/21/20 90 day supply with 1 refill LOV: 07/02/20 Please advise. Thanks TNP

## 2020-07-16 NOTE — Telephone Encounter (Signed)
Medication sent into the pharmacy. 

## 2020-07-17 ENCOUNTER — Other Ambulatory Visit: Payer: Self-pay

## 2020-07-18 DIAGNOSIS — M84369A Stress fracture, unspecified tibia and fibula, initial encounter for fracture: Secondary | ICD-10-CM

## 2020-07-18 HISTORY — DX: Stress fracture, unspecified tibia and fibula, initial encounter for fracture: M84.369A

## 2020-07-19 ENCOUNTER — Ambulatory Visit: Payer: Self-pay | Admitting: Urology

## 2020-07-20 ENCOUNTER — Other Ambulatory Visit: Payer: Self-pay

## 2020-07-20 ENCOUNTER — Other Ambulatory Visit: Payer: BC Managed Care – PPO

## 2020-07-20 DIAGNOSIS — E291 Testicular hypofunction: Secondary | ICD-10-CM

## 2020-07-21 LAB — TESTOSTERONE: Testosterone: 696 ng/dL (ref 264–916)

## 2020-07-21 LAB — HEMATOCRIT: Hematocrit: 42 % (ref 37.5–51.0)

## 2020-07-21 LAB — PSA: Prostate Specific Ag, Serum: 0.5 ng/mL (ref 0.0–4.0)

## 2020-07-23 ENCOUNTER — Other Ambulatory Visit: Payer: Self-pay | Admitting: Urology

## 2020-07-23 DIAGNOSIS — M8580 Other specified disorders of bone density and structure, unspecified site: Secondary | ICD-10-CM | POA: Insufficient documentation

## 2020-07-23 DIAGNOSIS — E559 Vitamin D deficiency, unspecified: Secondary | ICD-10-CM | POA: Insufficient documentation

## 2020-07-23 DIAGNOSIS — N401 Enlarged prostate with lower urinary tract symptoms: Secondary | ICD-10-CM

## 2020-07-23 DIAGNOSIS — S8262XA Displaced fracture of lateral malleolus of left fibula, initial encounter for closed fracture: Secondary | ICD-10-CM

## 2020-07-23 DIAGNOSIS — M84362A Stress fracture, left tibia, initial encounter for fracture: Secondary | ICD-10-CM | POA: Insufficient documentation

## 2020-07-23 HISTORY — DX: Displaced fracture of lateral malleolus of left fibula, initial encounter for closed fracture: S82.62XA

## 2020-07-26 ENCOUNTER — Other Ambulatory Visit: Payer: Self-pay | Admitting: Family Medicine

## 2020-07-26 ENCOUNTER — Telehealth: Payer: Self-pay

## 2020-07-26 ENCOUNTER — Other Ambulatory Visit: Payer: Self-pay

## 2020-07-26 DIAGNOSIS — I10 Essential (primary) hypertension: Secondary | ICD-10-CM

## 2020-07-26 MED ORDER — PAXLOVID 20 X 150 MG & 10 X 100MG PO TBPK
3.0000 | ORAL_TABLET | Freq: Two times a day (BID) | ORAL | 0 refills | Status: DC
  Filled 2020-07-26: qty 30, 5d supply, fill #0

## 2020-07-26 NOTE — Telephone Encounter (Signed)
Copied from CRM [id number]. Topic: General - Call Back - No Documentation >> Jul 26, 2020 11:02 AM Randol Kern wrote: Reason for CRM: Pt and his wife have both tested postivie for covid 19  Congestion, cough, achy schoulders, loss of taste, scratchy throat, headache  Requesting "anti viral pill" specifically -please advise  [facility] [address] [place] - [place], [place] - [address] AT [address] [place] [address] Phone: [phone] Fax: [phone]   Best contact [phone]

## 2020-07-27 ENCOUNTER — Other Ambulatory Visit: Payer: Self-pay

## 2020-07-27 ENCOUNTER — Ambulatory Visit: Payer: Self-pay | Admitting: Urology

## 2020-07-27 LAB — RENAL FUNCTION PANEL
Albumin: 4.4 g/dL (ref 3.8–4.8)
BUN/Creatinine Ratio: 13 (ref 10–24)
BUN: 13 mg/dL (ref 8–27)
CO2: 23 mmol/L (ref 20–29)
Calcium: 9 mg/dL (ref 8.6–10.2)
Chloride: 100 mmol/L (ref 96–106)
Creatinine, Ser: 0.97 mg/dL (ref 0.76–1.27)
Glucose: 136 mg/dL — ABNORMAL HIGH (ref 65–99)
Phosphorus: 2.8 mg/dL (ref 2.8–4.1)
Potassium: 3.4 mmol/L — ABNORMAL LOW (ref 3.5–5.2)
Sodium: 140 mmol/L (ref 134–144)
eGFR: 87 mL/min/{1.73_m2} (ref >59)

## 2020-08-08 LAB — HM DEXA SCAN

## 2020-09-01 ENCOUNTER — Other Ambulatory Visit: Payer: Self-pay | Admitting: Urology

## 2020-09-01 DIAGNOSIS — N401 Enlarged prostate with lower urinary tract symptoms: Secondary | ICD-10-CM

## 2020-09-28 ENCOUNTER — Other Ambulatory Visit: Payer: Self-pay

## 2020-09-28 ENCOUNTER — Ambulatory Visit (INDEPENDENT_AMBULATORY_CARE_PROVIDER_SITE_OTHER): Payer: BC Managed Care – PPO | Admitting: Urology

## 2020-09-28 ENCOUNTER — Encounter: Payer: Self-pay | Admitting: Urology

## 2020-09-28 VITALS — BP 172/75 | HR 78 | Ht 72.0 in | Wt 206.0 lb

## 2020-09-28 DIAGNOSIS — E291 Testicular hypofunction: Secondary | ICD-10-CM

## 2020-09-28 DIAGNOSIS — N5201 Erectile dysfunction due to arterial insufficiency: Secondary | ICD-10-CM

## 2020-09-28 DIAGNOSIS — N401 Enlarged prostate with lower urinary tract symptoms: Secondary | ICD-10-CM

## 2020-09-28 NOTE — Progress Notes (Signed)
09/28/2020 11:55 AM   [name] [name] 06/06/1955 [id number]  Referring provider: Maple Hudson., MD [address] [place],  [place] [address]  Chief Complaint  Patient presents with   Hypogonadism    Urologic history: 1.  Hypogonadism -On topical TRT   2.  BPH with LUTS -Dutasteride/tamsulosin   3.  Erectile dysfunction -Tadalafil  HPI: 65 y.o. male presents for follow-up.  Last visit 07/20/2019 Remains on generic [facility] 07/20/2020: Testosterone 696 ng/dL; PSA 0.5; HCT 42 Moderate LUTS with most bothersome symptoms weak stream and urinary frequency; IPSS 19/35 Potentially interested in minimally invasive BPH procedures Recently with mild perineal discomfort Remains on tadalafil for ED     PMH: Past Medical History:  Diagnosis Date   Allergy    Asthma    BP (high blood pressure) 11/07/2014   Should stop combination of Tekturna (DRI)  and ARB    Hypertension    HYPERTENSION, SEVERE 03/15/2007   Qualifier: Diagnosis of  By: Vernie Murders     Prostate enlargement    Sleep apnea     Surgical History: Past Surgical History:  Procedure Laterality Date   APPENDECTOMY  1990   Dr Lemar Livings   COLONOSCOPY  2008   Dr Lemar Livings   COLONOSCOPY WITH PROPOFOL N/A 08/13/2016   Procedure: COLONOSCOPY WITH PROPOFOL;  Surgeon: Earline Mayotte, MD;  Location: [facility] ENDOSCOPY;  Service: Endoscopy;  Laterality: N/A;   HEMORRHOID SURGERY     HERNIA REPAIR  01/20/14   umbilical hernia   KNEE ARTHROSCOPY Left    KNEE SURGERY Left 1986   NASAL SEPTUM SURGERY     TONSILLECTOMY     TOTAL KNEE ARTHROPLASTY Left 03/29/2019   Procedure: TOTAL KNEE ARTHROPLASTY;  Surgeon: Christena Flake, MD;  Location: [facility] ORS;  Service: Orthopedics;  Laterality: Left;   VASECTOMY      Home Medications:  Allergies as of 09/28/2020       Reactions   Hydralazine Hives, Shortness Of Breath   Chest tightness, severe headache, dyspnea Other reaction(s): Chest Pain,  Headache, Respiratory Distress   Gluten Meal Diarrhea   bloating   Moxifloxacin Hives   Avelox   Pseudoephedrine Other (See Comments)   Confusion, passed out, numbness   Sulfa Antibiotics Hives   Sulfonamide Derivatives Hives        Medication List        Accurate as of September 28, 2020 11:55 AM. If you have any questions, ask your nurse or doctor.          STOP taking these medications    cefdinir 300 MG capsule Commonly known as: OMNICEF Stopped by: Riki Altes, MD   colchicine 0.6 MG tablet Stopped by: Riki Altes, MD   Paxlovid 20 x 150 MG & 10 x 100MG  Tbpk Generic drug: Nirmatrelvir & Ritonavir Stopped by: , MD   Spiriva Respimat 1.25 MCG/ACT Aers Generic drug: Tiotropium Bromide Monohydrate Stopped by: Riki Altes, MD       TAKE these medications    albuterol 108 (90 Base) MCG/ACT inhaler Commonly known as: VENTOLIN HFA Inhale 1-2 puffs into the lungs every 6 (six) hours as needed for wheezing or shortness of breath.   Alvesco 160 MCG/ACT inhaler Generic drug: ciclesonide SMARTSIG:1 Inhalation Via Inhaler Twice Daily   Azelastine HCl 0.15 % Soln Place into the nose.   budesonide 1 MG/2ML nebulizer solution Commonly known as: PULMICORT Inhale 1 mg into the lungs 2 (two) times  daily.   budesonide-formoterol 160-4.5 MCG/ACT inhaler Commonly known as: SYMBICORT Inhale 2 puffs into the lungs 2 (two) times daily.   Calcium 600+D 600-800 MG-UNIT Tabs Generic drug: Calcium Carb-Cholecalciferol Take 1 tablet by mouth daily.   calcium carbonate 500 MG chewable tablet Commonly known as: TUMS - dosed in mg elemental calcium Chew 500 mg by mouth daily as needed for indigestion or heartburn.   Cholecalciferol 50 MCG (2000 UT) Tabs Take 4,000 Units by mouth daily.   dexlansoprazole 60 MG capsule Commonly known as: Dexilant Take 1 capsule (60 mg total) by mouth daily.   dextromethorphan-guaiFENesin 30-600 MG 12hr  tablet Commonly known as: MUCINEX DM Take 1 tablet by mouth 2 (two) times daily as needed for cough.   dutasteride 0.5 MG capsule Commonly known as: AVODART TAKE ONE CAPSULE BY MOUTH ONE TIME DAILY   Eliquis 2.5 MG Tabs tablet Generic drug: apixaban Take 2.5 mg by mouth 2 (two) times daily.   famotidine 40 MG tablet Commonly known as: PEPCID TAKE ONE TABLET BY MOUTH DAILY What changed: when to take this   Glucosamine Chondroitin Triple Tabs Take 1 tablet by mouth 2 (two) times daily.   hydrochlorothiazide 25 MG tablet Commonly known as: HYDRODIURIL Take 1 tablet (25 mg total) by mouth daily.   ketotifen 0.025 % ophthalmic solution Commonly known as: ZADITOR Place 1 drop into both eyes daily.   losartan 100 MG tablet Commonly known as: COZAAR TAKE ONE TABLET BY MOUTH DAILY   Multi-Vitamins Tabs Take 1 tablet by mouth daily.   PreserVision AREDS 2 Caps Take 1 capsule by mouth 2 (two) times daily.   Qnasl 80 MCG/ACT Aers Generic drug: Beclomethasone Dipropionate Place 1 spray into both nostrils daily.   tadalafil 5 MG tablet Commonly known as: CIALIS TAKE ONE TABLET BY MOUTH AT BEDTIME   tamsulosin 0.4 MG Caps capsule Commonly known as: FLOMAX Take 1 capsule (0.4 mg total) by mouth daily.   Testosterone 30 MG/ACT Soln APPLY ONE SWIPE TO EACH AXILA DAILY   Turmeric 500 MG Caps Take 500 mg by mouth 2 (two) times daily.   verapamil 240 MG CR tablet Commonly known as: CALAN-SR Take 1 tablet (240 mg total) by mouth 2 (two) times daily.        Allergies:  Allergies  Allergen Reactions   Hydralazine Hives and Shortness Of Breath    Chest tightness, severe headache, dyspnea Other reaction(s): Chest Pain, Headache, Respiratory Distress   Gluten Meal Diarrhea    bloating   Moxifloxacin Hives    Avelox   Pseudoephedrine Other (See Comments)    Confusion, passed out, numbness   Sulfa Antibiotics Hives   Sulfonamide Derivatives Hives    Family  History: Family History  Problem Relation Age of Onset   Cancer Mother        ovarian   Glaucoma Mother    Cancer Father        prostate   Hypertension Sister    Asthma Sister    Alcohol abuse Paternal Uncle    Liver disease Paternal Uncle    Cancer - Prostate Paternal Uncle    Cancer Maternal Uncle        lung; two mat uncles   Cancer Paternal Grandmother        breast   Cancer Paternal Aunt        lung cancer in a smoker    Social History:  reports that he has never smoked. He has never used smokeless tobacco. He  reports current alcohol use. He reports that he does not use drugs.   Physical Exam: BP (!) 172/75 (BP Location: Left Arm, Patient Position: Sitting, Cuff Size: Normal)   Pulse 78   Ht 6' (1.829 m)   Wt 206 lb (93.4 kg)   BMI 27.94 kg/m   Constitutional:  Alert and oriented, No acute distress. HEENT: [place] AT, moist mucus membranes.  Trachea midline, no masses. Cardiovascular: No clubbing, cyanosis, or edema. Respiratory: Normal respiratory effort, no increased work of breathing. GU: Prostate 45 g, smooth without nodules Neurologic: Grossly intact, no focal deficits, moving all 4 extremities. Psychiatric: Normal mood and affect.   Assessment & Plan:    1.  Hypogonadism Stable symptoms on TRT Monitoring labs look good Office visit 6 months testosterone/hematocrit and 1 year follow-up with testosterone, hematocrit, PSA  2.  BPH with moderate LUTS We discussed UroLift and he may be interested in pursuing Was provided literature  3.  Erectile dysfunction Stable on tadalafil  He was going to have his pharmacy send refill request for his medications   Riki Altes, MD  [facility] [address], [address] [place], [place] [address] [phone]

## 2020-10-01 DIAGNOSIS — S82399A Other fracture of lower end of unspecified tibia, initial encounter for closed fracture: Secondary | ICD-10-CM | POA: Insufficient documentation

## 2020-10-01 LAB — URINALYSIS, COMPLETE
Bilirubin, UA: NEGATIVE
Glucose, UA: NEGATIVE
Ketones, UA: NEGATIVE
Leukocytes,UA: NEGATIVE
Nitrite, UA: NEGATIVE
Protein,UA: NEGATIVE
RBC, UA: NEGATIVE
Specific Gravity, UA: 1.015 (ref 1.005–1.030)
Urobilinogen, Ur: 0.2 mg/dL (ref 0.2–1.0)
pH, UA: 7.5 (ref 5.0–7.5)

## 2020-10-01 LAB — MICROSCOPIC EXAMINATION
Bacteria, UA: NONE SEEN
Epithelial Cells (non renal): NONE SEEN /hpf (ref 0–10)
RBC, Urine: NONE SEEN /hpf (ref 0–2)

## 2020-10-06 ENCOUNTER — Other Ambulatory Visit: Payer: Self-pay | Admitting: Family Medicine

## 2020-10-06 ENCOUNTER — Other Ambulatory Visit: Payer: Self-pay | Admitting: Urology

## 2020-10-06 DIAGNOSIS — I10 Essential (primary) hypertension: Secondary | ICD-10-CM

## 2020-10-06 DIAGNOSIS — N401 Enlarged prostate with lower urinary tract symptoms: Secondary | ICD-10-CM

## 2020-10-06 NOTE — Telephone Encounter (Signed)
Requested Prescriptions  Pending Prescriptions Disp Refills  . losartan (COZAAR) 100 MG tablet [Pharmacy Med Name: LOSARTAN 100 MG TAB[*]] 90 tablet 0    Sig: TAKE ONE TABLET BY MOUTH ONE TIME DAILY     Cardiovascular:  Angiotensin Receptor Blockers Failed - 10/06/2020  1:23 PM      Failed - K in normal range and within 180 days    Potassium  Date Value Ref Range Status  07/26/2020 3.4 (L) 3.5 - 5.2 mmol/L Final         Failed - Last BP in normal range    BP Readings from Last 1 Encounters:  09/28/20 (!) 172/75         Passed - Cr in normal range and within 180 days    Creatinine, Ser  Date Value Ref Range Status  07/26/2020 0.97 0.76 - 1.27 mg/dL Final         Passed - Patient is not pregnant      Passed - Valid encounter within last 6 months    Recent Outpatient Visits          3 months ago Essential (primary) hypertension   [facility] Maple Hudson., MD   5 months ago Acute left ankle pain   [facility] Maple Hudson., MD   9 months ago Annual physical exam   [facility] Maple Hudson., MD   1 year ago Acute deep vein thrombosis (DVT) of popliteal vein of left lower extremity [facility])   [facility] Maple Hudson., MD   1 year ago Obstructive apnea   [facility] Maple Hudson., MD      Future Appointments            In 1 month Maple Hudson., MD [facility]   In 2 months Maple Hudson., MD [facility]   In 12 months Stoioff, Verna Czech, MD [facility]

## 2020-10-09 ENCOUNTER — Other Ambulatory Visit: Payer: Self-pay | Admitting: *Deleted

## 2020-10-09 DIAGNOSIS — N401 Enlarged prostate with lower urinary tract symptoms: Secondary | ICD-10-CM

## 2020-10-09 MED ORDER — TAMSULOSIN HCL 0.4 MG PO CAPS: 0.4000 mg | ORAL_CAPSULE | Freq: Every day | ORAL | 3 refills | Status: DC

## 2020-10-21 ENCOUNTER — Other Ambulatory Visit: Payer: Self-pay | Admitting: Urology

## 2020-10-21 DIAGNOSIS — N401 Enlarged prostate with lower urinary tract symptoms: Secondary | ICD-10-CM

## 2020-11-21 ENCOUNTER — Telehealth: Payer: Self-pay

## 2020-11-21 NOTE — Telephone Encounter (Signed)
Copied from CRM [id number]. Topic: General - Other >> Nov 21, 2020  1:16 PM Jaquita Rector A wrote: Reason for CRM: Patient called in asking for Dr Sullivan Lone to clarify if he need to come in for his visit on 11/26/20 since he only have Medicare part A and other insurance. Would like a call back at Ph# [phone]

## 2020-11-22 NOTE — Telephone Encounter (Signed)
Advised patient that he will need to still come at that visit on 11/26/20.

## 2020-11-26 ENCOUNTER — Ambulatory Visit (INDEPENDENT_AMBULATORY_CARE_PROVIDER_SITE_OTHER): Payer: BC Managed Care – PPO | Admitting: Family Medicine

## 2020-11-26 ENCOUNTER — Other Ambulatory Visit: Payer: Self-pay

## 2020-11-26 ENCOUNTER — Encounter: Payer: Self-pay | Admitting: Family Medicine

## 2020-11-26 VITALS — BP 143/79 | HR 70 | Temp 98.3°F | Resp 16 | Ht 72.0 in | Wt 220.0 lb

## 2020-11-26 DIAGNOSIS — I10 Essential (primary) hypertension: Secondary | ICD-10-CM

## 2020-11-26 DIAGNOSIS — Z0000 Encounter for general adult medical examination without abnormal findings: Secondary | ICD-10-CM | POA: Diagnosis not present

## 2020-11-26 DIAGNOSIS — K219 Gastro-esophageal reflux disease without esophagitis: Secondary | ICD-10-CM | POA: Diagnosis not present

## 2020-11-26 DIAGNOSIS — G4733 Obstructive sleep apnea (adult) (pediatric): Secondary | ICD-10-CM | POA: Diagnosis not present

## 2020-11-26 NOTE — Progress Notes (Signed)
I,[name] [name],acting as a scribe for [name] Mans, MD.,have documented all relevant documentation on the behalf of [name] Mans, MD,as directed by  [name] Mans, MD while in the presence of [name] Mans, MD.   Complete physical exam   Patient: [name] [name]   DOB: Nov 17, 1955   65 y.o. Male  MRN: [id number] Visit Date: 11/26/2020  Today's healthcare provider: Megan Mans, MD   Chief Complaint  Patient presents with   Annual Exam   Subjective    [name] [name] is a 65 y.o. male who presents today for a complete physical exam.  He reports consuming a general and gluten free  diet. The patient does not participate in regular exercise at present. He generally feels fairly well. He reports sleeping fairly well. He does not have additional problems to discuss today.  He is planning to retire next May HPI    Past Medical History:  Diagnosis Date   Allergy    Asthma    BP (high blood pressure) 11/07/2014   Should stop combination of Tekturna (DRI)  and ARB    Hypertension    HYPERTENSION, SEVERE 03/15/2007   Qualifier: Diagnosis of  By: Vernie Murders     Prostate enlargement    Sleep apnea    Past Surgical History:  Procedure Laterality Date   APPENDECTOMY  1990   Dr Lemar Livings   COLONOSCOPY  2008   Dr Lemar Livings   COLONOSCOPY WITH PROPOFOL N/A 08/13/2016   Procedure: COLONOSCOPY WITH PROPOFOL;  Surgeon: Earline Mayotte, MD;  Location: [facility] ENDOSCOPY;  Service: Endoscopy;  Laterality: N/A;   HEMORRHOID SURGERY     HERNIA REPAIR  01/20/14   umbilical hernia   KNEE ARTHROSCOPY Left    KNEE SURGERY Left 1986   NASAL SEPTUM SURGERY     TONSILLECTOMY     TOTAL KNEE ARTHROPLASTY Left 03/29/2019   Procedure: TOTAL KNEE ARTHROPLASTY;  Surgeon: Christena Flake, MD;  Location: [facility] ORS;  Service: Orthopedics;  Laterality: Left;   VASECTOMY     Social History   Socioeconomic History   Marital status: Married    Spouse name: Not on file    Number of children: Not on file   Years of education: Not on file   Highest education level: Not on file  Occupational History   Not on file  Tobacco Use   Smoking status: Never   Smokeless tobacco: Never   Tobacco comments:    Exposed to second hand smoke as a child  Substance and Sexual Activity   Alcohol use: Yes    Alcohol/week: 0.0 standard drinks    Comment: occasionally   Drug use: No   Sexual activity: Not on file  Other Topics Concern   Not on file  Social History Narrative   Not on file   Social Determinants of Health   Financial Resource Strain: Not on file  Food Insecurity: Not on file  Transportation Needs: Not on file  Physical Activity: Not on file  Stress: Not on file  Social Connections: Not on file  Intimate Partner Violence: Not on file   Family Status  Relation Name Status   Mother  Deceased at age 42   Father  Deceased at age 28   Sister  Alive   Sister  Alive   Nutritional therapist  Deceased   Sister  Alive   Sister  Alive   Nutritional therapist  (Not Specified)   Nurse, mental health  (Not  Specified)   PGM  (Not Specified)   [employer]  (Not Specified)   Family History  Problem Relation Age of Onset   Cancer Mother        ovarian   Glaucoma Mother    Cancer Father        prostate   Hypertension Sister    Asthma Sister    Alcohol abuse Paternal Uncle    Liver disease Paternal Uncle    Cancer - Prostate Paternal Uncle    Cancer Maternal Uncle        lung; two mat uncles   Cancer Paternal Grandmother        breast   Cancer Paternal Aunt        lung cancer in a smoker   Allergies  Allergen Reactions   Hydralazine Hives and Shortness Of Breath    Chest tightness, severe headache, dyspnea Other reaction(s): Chest Pain, Headache, Respiratory Distress   Gluten Meal Diarrhea    bloating   Moxifloxacin Hives    Avelox   Pseudoephedrine Other (See Comments)    Confusion, passed out, numbness   Sulfa Antibiotics Hives   Sulfonamide Derivatives Hives    Patient  Care Team: Maple Hudson., MD as PCP - General (Family Medicine) Maple Hudson., MD (Family Medicine) Lemar Livings Merrily Pew, MD (General Surgery)   Medications: Outpatient Medications Prior to Visit  Medication Sig   albuterol (VENTOLIN HFA) 108 (90 Base) MCG/ACT inhaler Inhale 1-2 puffs into the lungs every 6 (six) hours as needed for wheezing or shortness of breath.    ALVESCO 160 MCG/ACT inhaler SMARTSIG:1 Inhalation Via Inhaler Twice Daily   Azelastine HCl 0.15 % SOLN Place into the nose.   budesonide (PULMICORT) 1 MG/2ML nebulizer solution Inhale 1 mg into the lungs 2 (two) times daily.    budesonide-formoterol (SYMBICORT) 160-4.5 MCG/ACT inhaler Inhale 2 puffs into the lungs 2 (two) times daily.   Calcium Carb-Cholecalciferol (CALCIUM 600+D) 600-800 MG-UNIT TABS Take 1 tablet by mouth daily.   calcium carbonate (TUMS - DOSED IN MG ELEMENTAL CALCIUM) 500 MG chewable tablet Chew 500 mg by mouth daily as needed for indigestion or heartburn.   Cholecalciferol 50 MCG (2000 UT) TABS Take 4,000 Units by mouth daily.    dexlansoprazole (DEXILANT) 60 MG capsule Take 1 capsule (60 mg total) by mouth daily.   dextromethorphan-guaiFENesin (MUCINEX DM) 30-600 MG 12hr tablet Take 1 tablet by mouth 2 (two) times daily as needed for cough.   dutasteride (AVODART) 0.5 MG capsule TAKE ONE CAPSULE BY MOUTH ONE TIME DAILY   famotidine (PEPCID) 40 MG tablet TAKE ONE TABLET BY MOUTH DAILY (Patient taking differently: Take 40 mg by mouth at bedtime.)   hydrochlorothiazide (HYDRODIURIL) 25 MG tablet Take 1 tablet (25 mg total) by mouth daily.   ketotifen (ZADITOR) 0.025 % ophthalmic solution Place 1 drop into both eyes daily.    losartan (COZAAR) 100 MG tablet TAKE ONE TABLET BY MOUTH ONE TIME DAILY   Misc Natural Products (GLUCOSAMINE CHONDROITIN TRIPLE) TABS Take 1 tablet by mouth 2 (two) times daily.   Multiple Vitamin (MULTI-VITAMINS) TABS Take 1 tablet by mouth daily.    Multiple  Vitamins-Minerals (PRESERVISION AREDS 2) CAPS Take 1 capsule by mouth 2 (two) times daily.   QNASL 80 MCG/ACT AERS Place 1 spray into both nostrils daily.    tadalafil (CIALIS) 5 MG tablet TAKE ONE TABLET BY MOUTH AT BEDTIME   tamsulosin (FLOMAX) 0.4 MG CAPS capsule Take 1 capsule (0.4 mg  total) by mouth daily.   Testosterone 30 MG/ACT SOLN APPLY ONE SWIPE TO EACH AXILA DAILY   Turmeric 500 MG CAPS Take 500 mg by mouth 2 (two) times daily.    verapamil (CALAN-SR) 240 MG CR tablet Take 1 tablet (240 mg total) by mouth 2 (two) times daily.   [DISCONTINUED] ELIQUIS 2.5 MG TABS tablet Take 2.5 mg by mouth 2 (two) times daily.   No facility-administered medications prior to visit.    Review of Systems  Respiratory:  Positive for apnea and cough.   All other systems reviewed and are negative.     Objective    BP (!) 143/79 (BP Location: Right Arm, Patient Position: Sitting, Cuff Size: Large)   Pulse 70   Temp 98.3 F (36.8 C) (Oral)   Resp 16   Ht 6' (1.829 m)   Wt 220 lb (99.8 kg)   SpO2 97%   BMI 29.84 kg/m  BP Readings from Last 3 Encounters:  11/26/20 (!) 143/79  09/28/20 (!) 172/75  07/02/20 137/66   Wt Readings from Last 3 Encounters:  11/26/20 220 lb (99.8 kg)  09/28/20 206 lb (93.4 kg)  07/02/20 221 lb (100.2 kg)      Physical Exam Vitals reviewed.  Constitutional:      Appearance: He is well-developed. He is obese.     Comments: Mild  Obesity--trunkal.  HENT:     Head: Normocephalic and atraumatic.     Right Ear: External ear normal.     Left Ear: External ear normal.     Nose: Nose normal.  Eyes:     General: No scleral icterus.    Conjunctiva/sclera: Conjunctivae normal.     Pupils: Pupils are equal, round, and reactive to light.  Neck:     Thyroid: No thyromegaly.  Cardiovascular:     Rate and Rhythm: Normal rate and regular rhythm.     Heart sounds: Normal heart sounds.  Pulmonary:     Effort: Pulmonary effort is normal.  Abdominal:      Palpations: Abdomen is soft.  Genitourinary:    Penis: Normal.      Testes: Normal.     Rectum: Guaiac result negative.  Lymphadenopathy:     Cervical: No cervical adenopathy.  Skin:    General: Skin is warm and dry.  Neurological:     General: No focal deficit present.     Mental Status: He is alert and oriented to person, place, and time.  Psychiatric:        Behavior: Behavior normal.        Thought Content: Thought content normal.        Judgment: Judgment normal.      Last depression screening scores PHQ 2/9 Scores 11/26/2020 01/02/2020 12/29/2018  PHQ - 2 Score 0 0 0  PHQ- 9 Score 0 - 0   Last fall risk screening Fall Risk  11/26/2020  Falls in the past year? 0  Number falls in past yr: 0  Injury with Fall? 0  Risk for fall due to : No Fall Risks  Follow up Falls evaluation completed   Last Audit-C alcohol use screening Alcohol Use Disorder Test (AUDIT) 11/26/2020  1. How often do you have a drink containing alcohol? 2  2. How many drinks containing alcohol do you have on a typical day when you are drinking? 0  3. How often do you have six or more drinks on one occasion? 0  AUDIT-C Score 2  Alcohol Brief Interventions/Follow-up -  A score of 3 or more in women, and 4 or more in men indicates increased risk for alcohol abuse, EXCEPT if all of the points are from question 1   No results found for any visits on 11/26/20.  Assessment & Plan    Routine Health Maintenance and Physical Exam  Exercise Activities and Dietary recommendations  Goals   None     Immunization History  Administered Date(s) Administered   H1N1 01/18/2008   Influenza Split 01/12/2010   Influenza,inj,Quad PF,6+ Mos 11/28/2014, 11/11/2018   Influenza-Unspecified 12/01/2016, 12/01/2017, 11/23/2019, 11/22/2020   Moderna SARS-COV2 Booster Vaccination 09/25/2020   PFIZER(Purple Top)SARS-COV-2 Vaccination 05/17/2019, 06/07/2019, 12/08/2019   Pneumococcal Polysaccharide-23 09/04/1998   Td  11/15/2003   Tdap 10/11/2013    Health Maintenance  Topic Date Due   HIV Screening  Never done   Zoster Vaccines- Shingrix (1 of 2) Never done   TETANUS/TDAP  10/12/2023   COLONOSCOPY (Pts 45-24yrs Insurance coverage will need to be confirmed)  08/14/2026   INFLUENZA VACCINE  Completed   COVID-19 Vaccine  Completed   Hepatitis C Screening  Completed   HPV VACCINES  Aged Out    Discussed health benefits of physical activity, and encouraged him to engage in regular exercise appropriate for his age and condition.  1. Annual physical exam  - Lipid panel - TSH - CBC w/Diff/Platelet - Comprehensive Metabolic Panel (CMET)  2. Essential hypertension  - Lipid panel - TSH - CBC w/Diff/Platelet - Comprehensive Metabolic Panel (CMET)  3. Obstructive apnea  - Lipid panel - TSH - CBC w/Diff/Platelet - Comprehensive Metabolic Panel (CMET)  4. Gastroesophageal reflux disease, unspecified whether esophagitis present He is scheduled to undergo manometry tomorrow at [facility] - Lipid panel - TSH - CBC w/Diff/Platelet - Comprehensive Metabolic Panel (CMET)   Return in about 6 months (around 05/26/2021).     I, [name] Mans, MD, have reviewed all documentation for this visit. The documentation on 11/29/20 for the exam, diagnosis, procedures, and orders are all accurate and complete.    Brock Mokry Wendelyn Breslow, MD  [facility] [phone] (phone) [phone] (fax)  [facility]

## 2020-12-01 LAB — LIPID PANEL
Chol/HDL Ratio: 3.5 ratio (ref 0.0–5.0)
Cholesterol, Total: 156 mg/dL (ref 100–199)
HDL: 44 mg/dL (ref >39)
LDL Chol Calc (NIH): 92 mg/dL (ref 0–99)
Triglycerides: 107 mg/dL (ref 0–149)
VLDL Cholesterol Cal: 20 mg/dL (ref 5–40)

## 2020-12-01 LAB — COMPREHENSIVE METABOLIC PANEL
ALT: 16 IU/L (ref 0–44)
AST: 17 IU/L (ref 0–40)
Albumin/Globulin Ratio: 2.5 — ABNORMAL HIGH (ref 1.2–2.2)
Albumin: 4.2 g/dL (ref 3.8–4.8)
Alkaline Phosphatase: 83 IU/L (ref 44–121)
BUN/Creatinine Ratio: 11 (ref 10–24)
BUN: 11 mg/dL (ref 8–27)
Bilirubin Total: 0.6 mg/dL (ref 0.0–1.2)
CO2: 24 mmol/L (ref 20–29)
Calcium: 8.6 mg/dL (ref 8.6–10.2)
Chloride: 102 mmol/L (ref 96–106)
Creatinine, Ser: 0.98 mg/dL (ref 0.76–1.27)
Globulin, Total: 1.7 g/dL (ref 1.5–4.5)
Glucose: 93 mg/dL (ref 65–99)
Potassium: 3.3 mmol/L — ABNORMAL LOW (ref 3.5–5.2)
Sodium: 142 mmol/L (ref 134–144)
Total Protein: 5.9 g/dL — ABNORMAL LOW (ref 6.0–8.5)
eGFR: 86 mL/min/{1.73_m2} (ref >59)

## 2020-12-01 LAB — CBC WITH DIFFERENTIAL/PLATELET
Basophils Absolute: 0 10*3/uL (ref 0.0–0.2)
Basos: 1 %
EOS (ABSOLUTE): 0.1 10*3/uL (ref 0.0–0.4)
Eos: 1 %
Hematocrit: 40.3 % (ref 37.5–51.0)
Hemoglobin: 13.7 g/dL (ref 13.0–17.7)
Immature Grans (Abs): 0 10*3/uL (ref 0.0–0.1)
Immature Granulocytes: 0 %
Lymphocytes Absolute: 1.4 10*3/uL (ref 0.7–3.1)
Lymphs: 25 %
MCH: 31.5 pg (ref 26.6–33.0)
MCHC: 34 g/dL (ref 31.5–35.7)
MCV: 93 fL (ref 79–97)
Monocytes Absolute: 0.4 10*3/uL (ref 0.1–0.9)
Monocytes: 7 %
Neutrophils Absolute: 3.7 10*3/uL (ref 1.4–7.0)
Neutrophils: 66 %
Platelets: 174 10*3/uL (ref 150–450)
RBC: 4.35 x10E6/uL (ref 4.14–5.80)
RDW: 12.2 % (ref 11.6–15.4)
WBC: 5.6 10*3/uL (ref 3.4–10.8)

## 2020-12-01 LAB — TSH: TSH: 1.12 u[IU]/mL (ref 0.450–4.500)

## 2020-12-15 ENCOUNTER — Other Ambulatory Visit: Payer: Self-pay | Admitting: Family Medicine

## 2020-12-15 DIAGNOSIS — K219 Gastro-esophageal reflux disease without esophagitis: Secondary | ICD-10-CM

## 2020-12-15 NOTE — Telephone Encounter (Signed)
Requested Prescriptions  Pending Prescriptions Disp Refills  . DEXILANT 60 MG capsule [Pharmacy Med Name: DEXILANT DR 60 MG CAP[*]] 90 capsule 2    Sig: TAKE ONE CAPSULE BY MOUTH ONE TIME DAILY     Gastroenterology: Proton Pump Inhibitors Passed - 12/15/2020 11:49 AM      Passed - Valid encounter within last 12 months    Recent Outpatient Visits          2 weeks ago Annual physical exam   [facility] Maple Hudson., MD   5 months ago Essential (primary) hypertension   [facility] Maple Hudson., MD   7 months ago Acute left ankle pain   [facility] Maple Hudson., MD   11 months ago Annual physical exam   [facility] Maple Hudson., MD   1 year ago Acute deep vein thrombosis (DVT) of popliteal vein of left lower extremity [facility])   [facility] Maple Hudson., MD      Future Appointments            In 2 weeks Maple Hudson., MD [facility]   In 5 months Maple Hudson., MD [facility]   In 9 months Stoioff, Verna Czech, MD [facility]

## 2020-12-29 ENCOUNTER — Other Ambulatory Visit: Payer: Self-pay | Admitting: Family Medicine

## 2020-12-29 ENCOUNTER — Other Ambulatory Visit: Payer: Self-pay | Admitting: Urology

## 2020-12-29 DIAGNOSIS — E291 Testicular hypofunction: Secondary | ICD-10-CM

## 2020-12-29 DIAGNOSIS — I10 Essential (primary) hypertension: Secondary | ICD-10-CM

## 2020-12-30 NOTE — Telephone Encounter (Signed)
Requested Prescriptions  Pending Prescriptions Disp Refills  . losartan (COZAAR) 100 MG tablet [Pharmacy Med Name: LOSARTAN 100 MG TAB[*]] 90 tablet 1    Sig: TAKE ONE TABLET BY MOUTH ONE TIME DAILY     Cardiovascular:  Angiotensin Receptor Blockers Failed - 12/29/2020 12:08 PM      Failed - K in normal range and within 180 days    Potassium  Date Value Ref Range Status  11/30/2020 3.3 (L) 3.5 - 5.2 mmol/L Final         Failed - Last BP in normal range    BP Readings from Last 1 Encounters:  11/26/20 (!) 143/79         Passed - Cr in normal range and within 180 days    Creatinine, Ser  Date Value Ref Range Status  11/30/2020 0.98 0.76 - 1.27 mg/dL Final         Passed - Patient is not pregnant      Passed - Valid encounter within last 6 months    Recent Outpatient Visits          1 month ago Annual physical exam   [facility] Maple Hudson., MD   6 months ago Essential (primary) hypertension   [facility] Maple Hudson., MD   8 months ago Acute left ankle pain   [facility] Maple Hudson., MD   12 months ago Annual physical exam   [facility] Maple Hudson., MD   1 year ago Acute deep vein thrombosis (DVT) of popliteal vein of left lower extremity [facility])   [facility] Maple Hudson., MD      Future Appointments            In 4 months Maple Hudson., MD [facility]   In 9 months Stoioff, Verna Czech, MD [facility]

## 2020-12-31 ENCOUNTER — Other Ambulatory Visit: Payer: Self-pay | Admitting: Family Medicine

## 2020-12-31 DIAGNOSIS — I10 Essential (primary) hypertension: Secondary | ICD-10-CM

## 2020-12-31 NOTE — Telephone Encounter (Signed)
Requested Prescriptions  Pending Prescriptions Disp Refills  . verapamil (CALAN-SR) 240 MG CR tablet [Pharmacy Med Name: VERAPAMIL ER 240 MG TAB] 180 tablet 1    Sig: TAKE ONE TABLET BY MOUTH TWICE A DAY     Cardiovascular:  Calcium Channel Blockers Failed - 12/31/2020 12:13 PM      Failed - Last BP in normal range    BP Readings from Last 1 Encounters:  11/26/20 (!) 143/79         Passed - Valid encounter within last 6 months    Recent Outpatient Visits          1 month ago Annual physical exam   [facility] Maple Hudson., MD   6 months ago Essential (primary) hypertension   [facility] Maple Hudson., MD   8 months ago Acute left ankle pain   [facility] Maple Hudson., MD   12 months ago Annual physical exam   [facility] Maple Hudson., MD   1 year ago Acute deep vein thrombosis (DVT) of popliteal vein of left lower extremity [facility])   [facility] Maple Hudson., MD      Future Appointments            In 4 months Maple Hudson., MD [facility]   In 9 months Stoioff, Verna Czech, MD [facility]

## 2021-01-02 ENCOUNTER — Ambulatory Visit: Payer: Self-pay | Admitting: Family Medicine

## 2021-01-13 ENCOUNTER — Other Ambulatory Visit: Payer: Self-pay | Admitting: Family Medicine

## 2021-01-13 NOTE — Telephone Encounter (Signed)
Requested Prescriptions  Pending Prescriptions Disp Refills  . hydrochlorothiazide (HYDRODIURIL) 25 MG tablet [Pharmacy Med Name: HYDROCHLOROTHIAZIDE 25 MG TAB[*]] 90 tablet 1    Sig: TAKE ONE TABLET BY MOUTH ONE TIME DAILY     Cardiovascular: Diuretics - Thiazide Failed - 01/13/2021 12:08 AM      Failed - K in normal range and within 360 days    Potassium  Date Value Ref Range Status  11/30/2020 3.3 (L) 3.5 - 5.2 mmol/L Final         Failed - Last BP in normal range    BP Readings from Last 1 Encounters:  11/26/20 (!) 143/79         Passed - Ca in normal range and within 360 days    Calcium  Date Value Ref Range Status  11/30/2020 8.6 8.6 - 10.2 mg/dL Final         Passed - Cr in normal range and within 360 days    Creatinine, Ser  Date Value Ref Range Status  11/30/2020 0.98 0.76 - 1.27 mg/dL Final         Passed - Na in normal range and within 360 days    Sodium  Date Value Ref Range Status  11/30/2020 142 134 - 144 mmol/L Final         Passed - Valid encounter within last 6 months    Recent Outpatient Visits          1 month ago Annual physical exam   [facility] Maple Hudson., MD   6 months ago Essential (primary) hypertension   [facility] Maple Hudson., MD   8 months ago Acute left ankle pain   [facility] Maple Hudson., MD   1 year ago Annual physical exam   [facility] Maple Hudson., MD   1 year ago Acute deep vein thrombosis (DVT) of popliteal vein of left lower extremity [facility])   [facility] Maple Hudson., MD      Future Appointments            In 4 months Maple Hudson., MD [facility]   In 8 months Stoioff, Verna Czech, MD [facility]

## 2021-01-30 ENCOUNTER — Other Ambulatory Visit: Payer: Self-pay | Admitting: *Deleted

## 2021-01-30 ENCOUNTER — Ambulatory Visit: Payer: Self-pay | Admitting: *Deleted

## 2021-01-30 MED ORDER — PREDNISONE 20 MG PO TABS: 20.0000 mg | ORAL_TABLET | Freq: Every day | ORAL | 0 refills | Status: DC

## 2021-01-30 MED ORDER — CEFDINIR 300 MG PO CAPS: 300.0000 mg | ORAL_CAPSULE | Freq: Two times a day (BID) | ORAL | 0 refills | Status: DC

## 2021-01-30 NOTE — Telephone Encounter (Signed)
I returned pt's call.   He had called in earlier today.   He was seen at the urgent care in [place] on Saturday and diagnosed with bronchitis.   He was given prednisone and cefdinir.   He will run out on Friday.   He is feeling better but still coughing up green mucus though not as dark as it was last Saturday.    He is requesting another round of prednisone and cefdinir.   "It usually requires 2 rounds for me to get over bronchitis".   I did not go over the care advice since he is already being treated for the bronchitis.   I sent his request to [facility] for Dr. Sullivan Lone to address.    Pt was agreeable to having someone call him back with Dr. Elisabeth Cara decision.  His number is [phone].

## 2021-01-30 NOTE — Telephone Encounter (Signed)
Rx's were sent to pharmacy. Patient was advised.

## 2021-01-30 NOTE — Telephone Encounter (Signed)
Reason for Disposition  [1] Continuous (nonstop) coughing interferes with work or school AND [2] no improvement using cough treatment per Care Advice    Requesting another round of prednisone and cefdinir for bronchitis diagnosed at the urgent care last week.  Still coughing up green mucus.  Answer Assessment - Initial Assessment Questions 1. ONSET: "When did the cough begin?"      He went to the urgent care on Sat. Down in [place].   I was given prednisone and cefdinir which I'm taking.   I'm worried about being sick over the weekend.   I was diagnosed with bronchitis.   I usually need 2 rounds of prednisone and antibiotic My eye is crusted and draining white stuff.   My right eye.   It started yesterday afternoon.    2. SEVERITY: "How bad is the cough today?"      Coughing up green mucus 3. SPUTUM: "Describe the color of your sputum" (none, dry cough; clear, white, yellow, green)     Coughing up green mucus.    4. HEMOPTYSIS: "Are you coughing up any blood?" If so ask: "How much?" (flecks, streaks, tablespoons, etc.)     No 5. DIFFICULTY BREATHING: "Are you having difficulty breathing?" If Yes, ask: "How bad is it?" (e.g., mild, moderate, severe)    - MILD: No SOB at rest, mild SOB with walking, speaks normally in sentences, can lie down, no retractions, pulse < 100.    - MODERATE: SOB at rest, SOB with minimal exertion and prefers to sit, cannot lie down flat, speaks in phrases, mild retractions, audible wheezing, pulse 100-120.    - SEVERE: Very SOB at rest, speaks in single words, struggling to breathe, sitting hunched forward, retractions, pulse > 120      No shortness of breath or wheezing 6. FEVER: "Do you have a fever?" If Yes, ask: "What is your temperature, how was it measured, and when did it start?"     No fever or body aches or chills.   I've done 2 Covid tests yesterday and today they are negative. 7. CARDIAC HISTORY: "Do you have any history of heart disease?" (e.g., heart  attack, congestive heart failure)      Not asked   Already been diagnosed with bronchitis and not getting better. 8. LUNG HISTORY: "Do you have any history of lung disease?"  (e.g., pulmonary embolus, asthma, emphysema)     Gets bronchitis easily and has a hard time getting over it 9. PE RISK FACTORS: "Do you have a history of blood clots?" (or: recent major surgery, recent prolonged travel, bedridden)     Not asked 10. OTHER SYMPTOMS: "Do you have any other symptoms?" (e.g., runny nose, wheezing, chest pain)       He is c/o his right eye being crusty and having a white,clear drainage that started yesterday afternoon. He is concerned about running out of the prednisone and cefdinir Friday and not being better while everyone is closed for Thanksgiving.   He takes his last doses of medication then and he is feeling a little better but not much.   His mucus is not as dark green as it was but still coughing up green mucus.   He mentioned he usually needs 2 rounds of prednisone and antibiotics to get over bronchitis.   11. PREGNANCY: "Is there any chance you are pregnant?" "When was your last menstrual period?"       N/A 12. TRAVEL: "Have you traveled out of the country  in the last month?" (e.g., travel history, exposures)       Traveled to [place] where he was seen at the urgent care while there last week.  Protocols used: Cough - Acute Productive-A-AH

## 2021-02-07 ENCOUNTER — Ambulatory Visit
Admission: RE | Admit: 2021-02-07 | Discharge: 2021-02-07 | Disposition: A | Discharge status: Home / Self Care | Payer: BC Managed Care – PPO | Attending: Internal Medicine | Admitting: Internal Medicine

## 2021-02-07 ENCOUNTER — Encounter: Payer: Self-pay | Admitting: Internal Medicine

## 2021-02-07 ENCOUNTER — Other Ambulatory Visit: Payer: Self-pay

## 2021-02-07 ENCOUNTER — Ambulatory Visit
Admission: RE | Admit: 2021-02-07 | Discharge: 2021-02-07 | Disposition: A | Discharge status: Home / Self Care | Payer: BC Managed Care – PPO | Source: Ambulatory Visit | Attending: Internal Medicine | Admitting: Internal Medicine

## 2021-02-07 ENCOUNTER — Ambulatory Visit (INDEPENDENT_AMBULATORY_CARE_PROVIDER_SITE_OTHER): Payer: BC Managed Care – PPO | Admitting: Internal Medicine

## 2021-02-07 VITALS — BP 140/80 | HR 78 | Temp 97.8°F | Resp 16 | Ht 72.0 in | Wt 218.0 lb

## 2021-02-07 DIAGNOSIS — J4520 Mild intermittent asthma, uncomplicated: Secondary | ICD-10-CM

## 2021-02-07 DIAGNOSIS — Z9989 Dependence on other enabling machines and devices: Secondary | ICD-10-CM

## 2021-02-07 DIAGNOSIS — R0602 Shortness of breath: Secondary | ICD-10-CM | POA: Diagnosis not present

## 2021-02-07 DIAGNOSIS — Z7189 Other specified counseling: Secondary | ICD-10-CM | POA: Diagnosis not present

## 2021-02-07 DIAGNOSIS — J410 Simple chronic bronchitis: Secondary | ICD-10-CM

## 2021-02-07 DIAGNOSIS — G4733 Obstructive sleep apnea (adult) (pediatric): Secondary | ICD-10-CM

## 2021-02-07 MED ORDER — AZITHROMYCIN 250 MG PO TABS: ORAL_TABLET | ORAL | 0 refills | Status: AC

## 2021-02-07 NOTE — Patient Instructions (Signed)
Sleep Apnea ?Sleep apnea affects breathing during sleep. It causes breathing to stop for 10 seconds or more, or to become shallow. People with sleep apnea usually snore loudly. ?It can also increase the risk of: ?Heart attack. ?Stroke. ?Being very overweight (obese). ?Diabetes. ?Heart failure. ?Irregular heartbeat. ?High blood pressure. ?The goal of treatment is to help you breathe normally again. ?What are the causes? ?The most common cause of this condition is a collapsed or blocked airway. ?There are three kinds of sleep apnea: ?Obstructive sleep apnea. This is caused by a blocked or collapsed airway. ?Central sleep apnea. This happens when the brain does not send the right signals to the muscles that control breathing. ?Mixed sleep apnea. This is a combination of obstructive and central sleep apnea. ?What increases the risk? ?Being overweight. ?Smoking. ?Having a small airway. ?Being older. ?Being male. ?Drinking alcohol. ?Taking medicines to calm yourself (sedatives or tranquilizers). ?Having family members with the condition. ?Having a tongue or tonsils that are larger than normal. ?What are the signs or symptoms? ?Trouble staying asleep. ?Loud snoring. ?Headaches in the morning. ?Waking up gasping. ?Dry mouth or sore throat in the morning. ?Being sleepy or tired during the day. ?If you are sleepy or tired during the day, you may also: ?Not be able to focus your mind (concentrate). ?Forget things. ?Get angry a lot and have mood swings. ?Feel sad (depressed). ?Have changes in your personality. ?Have less interest in sex, if you are male. ?Be unable to have an erection, if you are male. ?How is this treated? ? ?Sleeping on your side. ?Using a medicine to get rid of mucus in your nose (decongestant). ?Avoiding the use of alcohol, medicines to help you relax, or certain pain medicines (narcotics). ?Losing weight, if needed. ?Changing your diet. ?Quitting smoking. ?Using a machine to open your airway while you  sleep, such as: ?An oral appliance. This is a mouthpiece that shifts your lower jaw forward. ?A CPAP device. This device blows air through a mask when you breathe out (exhale). ?An EPAP device. This has valves that you put in each nostril. ?A BIPAP device. This device blows air through a mask when you breathe in (inhale) and breathe out. ?Having surgery if other treatments do not work. ?Follow these instructions at home: ?Lifestyle ?Make changes that your doctor recommends. ?Eat a healthy diet. ?Lose weight if needed. ?Avoid alcohol, medicines to help you relax, and some pain medicines. ?Do not smoke or use any products that contain nicotine or tobacco. If you need help quitting, ask your doctor. ?General instructions ?Take over-the-counter and prescription medicines only as told by your doctor. ?If you were given a machine to use while you sleep, use it only as told by your doctor. ?If you are having surgery, make sure to tell your doctor you have sleep apnea. You may need to bring your device with you. ?Keep all follow-up visits. ?Contact a doctor if: ?The machine that you were given to use during sleep bothers you or does not seem to be working. ?You do not get better. ?You get worse. ?Get help right away if: ?Your chest hurts. ?You have trouble breathing in enough air. ?You have an uncomfortable feeling in your back, arms, or stomach. ?You have trouble talking. ?One side of your body feels weak. ?A part of your face is hanging down. ?These symptoms may be an emergency. Get help right away. Call your local emergency services (911 in the [place] ?Do not wait to see if the symptoms   will go away. ?Do not drive yourself to the hospital. ?Summary ?This condition affects breathing during sleep. ?The most common cause is a collapsed or blocked airway. ?The goal of treatment is to help you breathe normally while you sleep. ?This information is not intended to replace advice given to you by your health care provider. Make  sure you discuss any questions you have with your health care provider. ?Document Revised: 10/03/2020 Document Reviewed: 02/03/2020 ?Elsevier Patient Education ? 2022 Elsevier Inc. ? ?

## 2021-02-07 NOTE — Progress Notes (Signed)
[facility] [place], [place] [address]  Pulmonary Sleep Medicine   Office Visit Note  Patient Name: [name] [name] DOB: 1955-08-18 MRN [id number]  Date of Service: 02/07/2021  Complaints/HPI: OSA on CPAP. Patient has been doing well with the CPAP device. Has no issues with the machine. Supplies are up todate. Last compliance 100%. He gets the supplies from [place]. He has had a cough for about 1-2weeks. He was in [place] had negative covid test. Has been taking ceftin. He saw his PCP and got another round and also steroids  ROS  General: (-) fever, (-) chills, (-) night sweats, (-) weakness Skin: (-) rashes, (-) itching,. Eyes: (-) visual changes, (-) redness, (-) itching. Nose and Sinuses: (-) nasal stuffiness or itchiness, (-) postnasal drip, (-) nosebleeds, (-) sinus trouble. Mouth and Throat: (-) sore throat, (-) hoarseness. Neck: (-) swollen glands, (-) enlarged thyroid, (-) neck pain. Respiratory: + cough, (-) bloody sputum, + shortness of breath, + wheezing. Cardiovascular: - ankle swelling, (-) chest pain. Lymphatic: (-) lymph node enlargement. Neurologic: (-) numbness, (-) tingling. Psychiatric: (-) anxiety, (-) depression   Current Medication: Outpatient Encounter Medications as of 02/07/2021  Medication Sig   albuterol (VENTOLIN HFA) 108 (90 Base) MCG/ACT inhaler Inhale 1-2 puffs into the lungs every 6 (six) hours as needed for wheezing or shortness of breath.    ALVESCO 160 MCG/ACT inhaler SMARTSIG:1 Inhalation Via Inhaler Twice Daily   Azelastine HCl 0.15 % SOLN Place into the nose.   budesonide-formoterol (SYMBICORT) 160-4.5 MCG/ACT inhaler Inhale 2 puffs into the lungs 2 (two) times daily.   Calcium Carb-Cholecalciferol (CALCIUM 600+D) 600-800 MG-UNIT TABS Take 1 tablet by mouth daily.   calcium carbonate (TUMS - DOSED IN MG ELEMENTAL CALCIUM) 500 MG chewable tablet Chew 500 mg by mouth daily as needed for indigestion or  heartburn.   DEXILANT 60 MG capsule TAKE ONE CAPSULE BY MOUTH ONE TIME DAILY   dextromethorphan-guaiFENesin (MUCINEX DM) 30-600 MG 12hr tablet Take 1 tablet by mouth 2 (two) times daily as needed for cough.   dutasteride (AVODART) 0.5 MG capsule TAKE ONE CAPSULE BY MOUTH ONE TIME DAILY   famotidine (PEPCID) 40 MG tablet TAKE ONE TABLET BY MOUTH DAILY (Patient taking differently: Take 40 mg by mouth at bedtime.)   hydrochlorothiazide (HYDRODIURIL) 25 MG tablet TAKE ONE TABLET BY MOUTH ONE TIME DAILY   ketotifen (ZADITOR) 0.025 % ophthalmic solution Place 1 drop into both eyes daily.    losartan (COZAAR) 100 MG tablet TAKE ONE TABLET BY MOUTH ONE TIME DAILY   Misc Natural Products (GLUCOSAMINE CHONDROITIN TRIPLE) TABS Take 1 tablet by mouth 2 (two) times daily.   Multiple Vitamin (MULTI-VITAMINS) TABS Take 1 tablet by mouth daily.    Multiple Vitamins-Minerals (PRESERVISION AREDS 2) CAPS Take 1 capsule by mouth 2 (two) times daily.   QNASL 80 MCG/ACT AERS Place 1 spray into both nostrils daily.    tadalafil (CIALIS) 5 MG tablet TAKE ONE TABLET BY MOUTH AT BEDTIME   tamsulosin (FLOMAX) 0.4 MG CAPS capsule Take 1 capsule (0.4 mg total) by mouth daily.   Testosterone 30 MG/ACT SOLN APPLY ONE SWIPE TO EACH AXILA DAILY   Turmeric 500 MG CAPS Take 500 mg by mouth 2 (two) times daily.    verapamil (CALAN-SR) 240 MG CR tablet TAKE ONE TABLET BY MOUTH TWICE A DAY   [DISCONTINUED] cefdinir (OMNICEF) 300 MG capsule Take 1 capsule (300 mg total) by mouth 2 (two) times daily.   [DISCONTINUED] predniSONE (DELTASONE)  20 MG tablet Take 1 tablet (20 mg total) by mouth daily with breakfast.   budesonide (PULMICORT) 1 MG/2ML nebulizer solution Inhale 1 mg into the lungs 2 (two) times daily.    Cholecalciferol 50 MCG (2000 UT) TABS Take 4,000 Units by mouth daily.    No facility-administered encounter medications on file as of 02/07/2021.    Surgical History: Past Surgical History:  Procedure Laterality Date    APPENDECTOMY  1990   Dr Bary Castilla   COLONOSCOPY  2008   Dr Bary Castilla   COLONOSCOPY WITH PROPOFOL N/A 08/13/2016   Procedure: COLONOSCOPY WITH PROPOFOL;  Surgeon: Robert Bellow, MD;  Location: [facility] ENDOSCOPY;  Service: Endoscopy;  Laterality: N/A;   HEMORRHOID SURGERY     HERNIA REPAIR  25/95/63   umbilical hernia   KNEE ARTHROSCOPY Left    KNEE SURGERY Left 1986   NASAL SEPTUM SURGERY     TONSILLECTOMY     TOTAL KNEE ARTHROPLASTY Left 03/29/2019   Procedure: TOTAL KNEE ARTHROPLASTY;  Surgeon: Corky Mull, MD;  Location: [facility] ORS;  Service: Orthopedics;  Laterality: Left;   VASECTOMY      Medical History: Past Medical History:  Diagnosis Date   Allergy    Asthma    BP (high blood pressure) 11/07/2014   Should stop combination of Tekturna (DRI)  and ARB    Hypertension    HYPERTENSION, SEVERE 03/15/2007   Qualifier: Diagnosis of  By: Tilden Dome     Prostate enlargement    Sleep apnea     Family History: Family History  Problem Relation Age of Onset   Cancer Mother        ovarian   Glaucoma Mother    Cancer Father        prostate   Hypertension Sister    Asthma Sister    Alcohol abuse Paternal Uncle    Liver disease Paternal Uncle    Cancer - Prostate Paternal Uncle    Cancer Maternal Uncle        lung; two mat uncles   Cancer Paternal Grandmother        breast   Cancer Paternal Aunt        lung cancer in a smoker    Social History: Social History   Socioeconomic History   Marital status: Married    Spouse name: Not on file   Number of children: Not on file   Years of education: Not on file   Highest education level: Not on file  Occupational History   Not on file  Tobacco Use   Smoking status: Never   Smokeless tobacco: Never   Tobacco comments:    Exposed to second hand smoke as a child  Substance and Sexual Activity   Alcohol use: Yes    Alcohol/week: 0.0 standard drinks    Comment: occasionally   Drug use: No   Sexual activity: Not on file   Other Topics Concern   Not on file  Social History Narrative   Not on file   Social Determinants of Health   Financial Resource Strain: Not on file  Food Insecurity: Not on file  Transportation Needs: Not on file  Physical Activity: Not on file  Stress: Not on file  Social Connections: Not on file  Intimate Partner Violence: Not on file    Vital Signs: Blood pressure 140/80, pulse 78, temperature 97.8 F (36.6 C), resp. rate 16, height 6' (1.829 m), weight 218 lb (98.9 kg), SpO2 97 %.  Examination:  General Appearance: The patient is well-developed, well-nourished, and in no distress. Skin: Gross inspection of skin unremarkable. Head: normocephalic, no gross deformities. Eyes: no gross deformities noted. ENT: ears appear grossly normal no exudates. Neck: Supple. No thyromegaly. No LAD. Respiratory: few rhonchi noted. Cardiovascular: Normal S1 and S2 without murmur or rub. Extremities: No cyanosis. pulses are equal. Neurologic: Alert and oriented. No involuntary movements.  LABS: Recent Results (from the past 2160 hour(s))  Lipid panel     Status: None   Collection Time: 11/30/20  8:25 AM  Result Value Ref Range   Cholesterol, Total 156 100 - 199 mg/dL   Triglycerides 107 0 - 149 mg/dL   HDL 44 >39 mg/dL   VLDL Cholesterol Cal 20 5 - 40 mg/dL   LDL Chol Calc (NIH) 92 0 - 99 mg/dL   Chol/HDL Ratio 3.5 0.0 - 5.0 ratio    Comment:                                   T. Chol/HDL Ratio                                             Men  Women                               1/2 Avg.Risk  3.4    3.3                                   Avg.Risk  5.0    4.4                                2X Avg.Risk  9.6    7.1                                3X Avg.Risk 23.4   11.0   TSH     Status: None   Collection Time: 11/30/20  8:25 AM  Result Value Ref Range   TSH 1.120 0.450 - 4.500 uIU/mL  CBC w/Diff/Platelet     Status: None   Collection Time: 11/30/20  8:25 AM  Result Value Ref Range    WBC 5.6 3.4 - 10.8 x10E3/uL   RBC 4.35 4.14 - 5.80 x10E6/uL   Hemoglobin 13.7 13.0 - 17.7 g/dL   Hematocrit 40.3 37.5 - 51.0 %   MCV 93 79 - 97 fL   MCH 31.5 26.6 - 33.0 pg   MCHC 34.0 31.5 - 35.7 g/dL   RDW 12.2 11.6 - 15.4 %   Platelets 174 150 - 450 x10E3/uL   Neutrophils 66 Not Estab. %   Lymphs 25 Not Estab. %   Monocytes 7 Not Estab. %   Eos 1 Not Estab. %   Basos 1 Not Estab. %   Neutrophils Absolute 3.7 1.4 - 7.0 x10E3/uL   Lymphocytes Absolute 1.4 0.7 - 3.1 x10E3/uL   Monocytes Absolute 0.4 0.1 - 0.9 x10E3/uL   EOS (ABSOLUTE) 0.1 0.0 - 0.4 x10E3/uL   Basophils Absolute 0.0 0.0 - 0.2 x10E3/uL  Immature Granulocytes 0 Not Estab. %   Immature Grans (Abs) 0.0 0.0 - 0.1 x10E3/uL  Comprehensive Metabolic Panel (CMET)     Status: Abnormal   Collection Time: 11/30/20  8:25 AM  Result Value Ref Range   Glucose 93 65 - 99 mg/dL    Comment:                **Effective December 03, 2020 Glucose reference**                  interval will be changing to:                                                             70 - 99    BUN 11 8 - 27 mg/dL   Creatinine, Ser 0.98 0.76 - 1.27 mg/dL   eGFR 86 >59 mL/min/1.73   BUN/Creatinine Ratio 11 10 - 24   Sodium 142 134 - 144 mmol/L   Potassium 3.3 (L) 3.5 - 5.2 mmol/L   Chloride 102 96 - 106 mmol/L   CO2 24 20 - 29 mmol/L   Calcium 8.6 8.6 - 10.2 mg/dL   Total Protein 5.9 (L) 6.0 - 8.5 g/dL   Albumin 4.2 3.8 - 4.8 g/dL   Globulin, Total 1.7 1.5 - 4.5 g/dL   Albumin/Globulin Ratio 2.5 (H) 1.2 - 2.2   Bilirubin Total 0.6 0.0 - 1.2 mg/dL   Alkaline Phosphatase 83 44 - 121 IU/L   AST 17 0 - 40 IU/L   ALT 16 0 - 44 IU/L    Radiology: DG Ankle Complete Left  Result Date: 04/23/2020 CLINICAL DATA:  Ankle pain EXAM: LEFT ANKLE COMPLETE - 3+ VIEW COMPARISON:  None. FINDINGS: There is extensive soft tissue swelling about the ankle. There is a subacute appearing nondisplaced fracture of the distal left fibula. There is a moderate-sized  plantar calcaneal spur. IMPRESSION: 1. Subacute appearing nondisplaced fracture of the distal left fibula. 2. Extensive soft tissue swelling about the ankle. Electronically Signed   By: Constance Holster M.D.   On: 04/23/2020 21:34    No results found.  No results found.    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 SOB (shortness  of breath) Patient is doing better overall from the shortness of breath.  No recent flareups we will continue to monitor closely.  2. OSA on CPAP On CPAP therapy doing well.  Plan is going to continue with current pressures he seems to be tolerating well  3. CPAP use counseling CPAP Counseling: had a lengthy discussion with the patient regarding the importance of PAP therapy in management of the sleep apnea. Patient  appears to understand the risk factor reduction and also understands the risks associated with untreated sleep apnea. Patient will try to make a good faith effort to remain compliant with therapy. Also instructed the patient on proper cleaning of the device including the water must be changed daily if possible and use of distilled water is preferred. Patient understands that the machine should be regularly cleaned with appropriate recommended cleaning solutions that do not damage the PAP machine for example given white vinegar and water rinses. Other methods such as ozone treatment may not be as good as these simple methods to achieve cleaning.   4. Simple chronic bronchitis (HCC) Continues to have some symptoms suggestive.  Given some azithromycin also do a follow-up chest x-ray - DG Chest 2 View; Future - azithromycin (ZITHROMAX) 250 MG tablet; Take 2 tablets on day 1, then 1 tablet daily on days 2 through 5  Dispense: 6 tablet; Refill: 0  5. Mild intermittent asthma without complication Probably having an acute flare at this point.  Azithromycin was given he is received on steroids.  If he does not improve we will do another round   General Counseling: I have discussed the findings of the evaluation and examination with Harrell Gave.  I have also discussed any further diagnostic evaluation thatmay be needed or ordered today. Lain verbalizes understanding of the findings of todays visit. We also reviewed his medications today and discussed drug interactions and side effects including but not limited excessive drowsiness and altered mental states. We also discussed that there is always a risk not just to him but also people around him. he has been encouraged to call the office with any questions or concerns that should arise related to todays visit.  No orders of the defined types were placed in this encounter.    Time spent: 56  I have personally obtained a history, examined the patient,  evaluated laboratory and imaging results, formulated the assessment and plan and placed orders.    Allyne Gee, MD [facility] Pulmonary and Critical Care Sleep medicine

## 2021-04-01 ENCOUNTER — Other Ambulatory Visit: Payer: BC Managed Care – PPO

## 2021-04-01 ENCOUNTER — Other Ambulatory Visit: Payer: Self-pay

## 2021-04-01 DIAGNOSIS — E291 Testicular hypofunction: Secondary | ICD-10-CM

## 2021-04-02 LAB — HEMATOCRIT: Hematocrit: 41.8 % (ref 37.5–51.0)

## 2021-04-02 LAB — TESTOSTERONE: Testosterone: 640 ng/dL (ref 264–916)

## 2021-04-29 ENCOUNTER — Ambulatory Visit: Payer: BC Managed Care – PPO | Admitting: Family Medicine

## 2021-04-29 ENCOUNTER — Telehealth: Payer: BC Managed Care – PPO

## 2021-04-29 ENCOUNTER — Encounter: Payer: Self-pay | Admitting: Family Medicine

## 2021-04-29 ENCOUNTER — Other Ambulatory Visit: Payer: Self-pay

## 2021-04-29 ENCOUNTER — Telehealth (INDEPENDENT_AMBULATORY_CARE_PROVIDER_SITE_OTHER): Payer: BC Managed Care – PPO | Admitting: Family Medicine

## 2021-04-29 DIAGNOSIS — J4541 Moderate persistent asthma with (acute) exacerbation: Secondary | ICD-10-CM | POA: Diagnosis not present

## 2021-04-29 DIAGNOSIS — J0140 Acute pansinusitis, unspecified: Secondary | ICD-10-CM

## 2021-04-29 MED ORDER — AMOXICILLIN-POT CLAVULANATE 875-125 MG PO TABS: 1.0000 | ORAL_TABLET | Freq: Two times a day (BID) | ORAL | 0 refills | Status: DC

## 2021-04-29 MED ORDER — PREDNISONE 20 MG PO TABS: 40.0000 mg | ORAL_TABLET | Freq: Every day | ORAL | 0 refills | Status: AC

## 2021-04-29 NOTE — Progress Notes (Signed)
MyChart Video Visit    Virtual Visit via Video Note   This visit type was conducted due to national recommendations for restrictions regarding the COVID-19 Pandemic (e.g. social distancing) in an effort to limit this patient's exposure and mitigate transmission in our community. This patient is at least at moderate risk for complications without adequate follow up. This format is felt to be most appropriate for this patient at this time. Physical exam was limited by quality of the video and audio technology used for the visit.    Patient location: home [facility] Persons involved in the visit: patient, provider  I discussed the limitations of evaluation and management by telemedicine and the availability of in person appointments. The patient expressed understanding and agreed to proceed.  Patient: [name] HawsChristopher D Gillette   DOB: 10/27/1955   66 y.o. Male  MRN: [id number] Visit Date: 04/29/2021  Today's healthcare provider: Shirlee LatchAngela Lawarence Meek, MD   I,[name],acting as a scribe for Shirlee LatchAngela Mayley Lish, MD.,have documented all relevant documentation on the behalf of Shirlee LatchAngela Phinneas Shakoor, MD,as directed by  Shirlee LatchAngela Kerrie Latour, MD while in the presence of Shirlee LatchAngela Viney Acocella, MD.   Chief Complaint  Patient presents with   URI   Subjective    URI  Associated symptoms include congestion, coughing, sinus pain, sneezing and a sore throat.  HPI     URI   Associated symptoms inlclude congestion, cough, headache, sinus pain, sneezing and sore throat.  Recent episode started in the past 7 days.  The problem has been gradually worsening since onset.  The temperature has been with in normal range.  Patient  is drinking plenty of fluids.  Past hisotry is significant for  occational episodes of bronchitis.  Patient is not a smoker.      Last edited by Marjie Skiffosas, Joseline E, CMA on 04/29/2021  3:46 PM.      H/o asthma - takes controller inhalers Sinus  congestion and green sputum Negative home COVID test Does not feel like the flu  Breathing ok with SOB Wheezing at night   Medications: Outpatient Medications Prior to Visit  Medication Sig   albuterol (VENTOLIN HFA) 108 (90 Base) MCG/ACT inhaler Inhale 1-2 puffs into the lungs every 6 (six) hours as needed for wheezing or shortness of breath.    ALVESCO 160 MCG/ACT inhaler SMARTSIG:1 Inhalation Via Inhaler Twice Daily   Azelastine HCl 0.15 % SOLN Place into the nose.   budesonide-formoterol (SYMBICORT) 160-4.5 MCG/ACT inhaler Inhale 2 puffs into the lungs 2 (two) times daily.   Calcium Carb-Cholecalciferol (CALCIUM 600+D) 600-800 MG-UNIT TABS Take 1 tablet by mouth daily.   calcium carbonate (TUMS - DOSED IN MG ELEMENTAL CALCIUM) 500 MG chewable tablet Chew 500 mg by mouth daily as needed for indigestion or heartburn.   DEXILANT 60 MG capsule TAKE ONE CAPSULE BY MOUTH ONE TIME DAILY   dextromethorphan-guaiFENesin (MUCINEX DM) 30-600 MG 12hr tablet Take 1 tablet by mouth 2 (two) times daily as needed for cough.   dutasteride (AVODART) 0.5 MG capsule TAKE ONE CAPSULE BY MOUTH ONE TIME DAILY   famotidine (PEPCID) 40 MG tablet TAKE ONE TABLET BY MOUTH DAILY (Patient taking differently: Take 40 mg by mouth at bedtime.)   hydrochlorothiazide (HYDRODIURIL) 25 MG tablet TAKE ONE TABLET BY MOUTH ONE TIME DAILY   ketotifen (ZADITOR) 0.025 % ophthalmic solution Place 1 drop into both eyes daily.    losartan (COZAAR) 100 MG tablet TAKE ONE TABLET BY MOUTH ONE TIME DAILY   Misc  Natural Products (GLUCOSAMINE CHONDROITIN TRIPLE) TABS Take 1 tablet by mouth 2 (two) times daily.   Multiple Vitamin (MULTI-VITAMINS) TABS Take 1 tablet by mouth daily.    Multiple Vitamins-Minerals (PRESERVISION AREDS 2) CAPS Take 1 capsule by mouth 2 (two) times daily.   QNASL 80 MCG/ACT AERS Place 1 spray into both nostrils daily.    tadalafil (CIALIS) 5 MG tablet TAKE ONE TABLET BY MOUTH AT BEDTIME   tamsulosin (FLOMAX)  0.4 MG CAPS capsule Take 1 capsule (0.4 mg total) by mouth daily.   Testosterone 30 MG/ACT SOLN APPLY ONE SWIPE TO EACH AXILA DAILY   Turmeric 500 MG CAPS Take 500 mg by mouth 2 (two) times daily.    verapamil (CALAN-SR) 240 MG CR tablet TAKE ONE TABLET BY MOUTH TWICE A DAY   [DISCONTINUED] budesonide (PULMICORT) 1 MG/2ML nebulizer solution Inhale 1 mg into the lungs 2 (two) times daily.    [DISCONTINUED] Cholecalciferol 50 MCG (2000 UT) TABS Take 4,000 Units by mouth daily.    No facility-administered medications prior to visit.    Review of Systems  Constitutional:  Negative for fever.  HENT:  Positive for congestion, postnasal drip, sinus pressure, sinus pain, sneezing and sore throat. Negative for trouble swallowing.   Respiratory:  Positive for cough.      Objective    There were no vitals taken for this visit.   Physical Exam Constitutional:      General: He is not in acute distress.    Appearance: Normal appearance. He is not diaphoretic.  HENT:     Head: Normocephalic.  Eyes:     Conjunctiva/sclera: Conjunctivae normal.  Pulmonary:     Effort: Pulmonary effort is normal. No respiratory distress.     Comments: +cough, audible wheeze Neurological:     Mental Status: He is alert and oriented to person, place, and time. Mental status is at baseline.       Assessment & Plan     1. Moderate persistent asthma with exacerbation - continue controller inhalers - increased wheezing c/w asthmatic bronchitis - no respiratory distress - prednisone 40mg  daily x5d - albuterol more frequently Return precautions discussed  2. Acute non-recurrent pansinusitis - symptoms and exam c/w sinusitis   - no evidence of AOM, CAP, strep pharyngitis, or other infection - given duration of symptoms, suspect bacterial etiology - will treat with Augmentin x7d - discussed symptomatic management (flonase, decongestants, etc), natural course, and return precautions     Return if symptoms  worsen or fail to improve.     I discussed the assessment and treatment plan with the patient. The patient was provided an opportunity to ask questions and all were answered. The patient agreed with the plan and demonstrated an understanding of the instructions.   The patient was advised to call back or seek an in-person evaluation if the symptoms worsen or if the condition fails to improve as anticipated.  I, Lavon Paganini, MD, have reviewed all documentation for this visit. The documentation on 04/29/21 for the exam, diagnosis, procedures, and orders are all accurate and complete.   Tahj Lindseth, Dionne Bucy, MD, MPH [facility]

## 2021-05-09 ENCOUNTER — Other Ambulatory Visit: Payer: Self-pay

## 2021-05-09 ENCOUNTER — Ambulatory Visit: Payer: Self-pay | Admitting: *Deleted

## 2021-05-09 MED ORDER — AMOXICILLIN-POT CLAVULANATE 875-125 MG PO TABS: 1.0000 | ORAL_TABLET | Freq: Two times a day (BID) | ORAL | 0 refills | Status: AC

## 2021-05-09 NOTE — Telephone Encounter (Signed)
Summary: coughing up green phlegm  ? Pt called in say still coughing up green stuff. He has already had an appt and prescribed antibiotics. He Is asking for another antibiotic. Please call back. Pharmacy is [facility] [address] - [place], [place] - [address] AT [address]  ?Phone: [phone]  ?Fax: [phone]   ?  ? ?Reason for Disposition ?? [1] Finished taking antibiotics AND [2] symptoms are BETTER but [3] not completely gone ? ?Answer Assessment - Initial Assessment Questions ?1. INFECTION: "What infection is the antibiotic being given for?" ?    Sinus infection treatment- still has bronchitis with cough ?2. ANTIBIOTIC: "What antibiotic are you taking" "How many times per day?" ?    Augmentin- finished ?3. DURATION: "When was the antibiotic started?" ?    Started 2/20- 7 days ?4. MAIN CONCERN OR SYMPTOM:  "What is your main concern right now?" ?    Bronchitis-cough-wet productive with green sputum ?5. BETTER-SAME-WORSE: "Are you getting better, staying the same, or getting worse compared to when you first started the antibiotics?" If getting worse, ask: "In what way?"  ?    Better- sinus infection, cough improved but still wet productive with green sputum ?6. FEVER: "Do you have a fever?" If Yes, ask: "What is your temperature, how was it measured, and when did it start?" ?    No- 98.3 ?7. SYMPTOMS: "Are there any other symptoms you're concerned about?" If Yes, ask: "When did it start?" ?    No other symptoms- cough ?8. FOLLOW-UP APPOINTMENT: "Do you have a follow-up appointment with your doctor?" ?    Yes- 3/20 ? ?Protocols used: Infection on Antibiotic Follow-up Call-A-AH ? ?

## 2021-05-09 NOTE — Telephone Encounter (Signed)
?  Chief Complaint: request antibiotic- bronchitis ?Symptoms: productive cough with green sputum ?Frequency: 2/20- diagnosed URI ?Pertinent Negatives: Patient denies fever, sinus symptoms ?Disposition: [] ED /[] Urgent Care (no appt availability in office) / [] Appointment(In office/virtual)/ []  [place] [facility]/ [] Home Care/ [] Refused Recommended Disposition /[] [place] [facility]/ [x]  Follow-up with PCP ?Additional Notes: Patient states he usually has to have 2 courses of antibiotics and his provider is aware of this. Patient advised last visit states if no better or worse have another appointment- will send request for review- but may be required to be seen. Patient again states this is normal for him to require follow up antibiotic dosing. Patient does have appointment  scheduled 3/20. Patient uses [facility] as pharmacy. Advised would send request for review.  ?

## 2021-05-16 ENCOUNTER — Telehealth: Payer: BC Managed Care – PPO | Admitting: Family Medicine

## 2021-05-24 NOTE — Progress Notes (Signed)
?  ? ?I,Elena D DeSanto,acting as a scribe for Wilhemena Durie, MD.,have documented all relevant documentation on the behalf of Wilhemena Durie, MD,as directed by  Wilhemena Durie, MD while in the presence of Wilhemena Durie, MD. ? ? ?Established patient visit ? ? ?Patient: [name] [name]   DOB: 04-Sep-1955   66 y.o. Male  MRN: [id number] ?Visit Date: 05/27/2021 ? ?Today's healthcare provider: Wilhemena Durie, MD  ? ?No chief complaint on file. ? ?Subjective  ?  ?HPI  ?Patient comes in today for follow-up.  He had a significant sinus infection for which she received amoxicillin for 2 different prescriptions.  He feels much better but continues to have some postnasal drainage and a deep cough that is markedly improved.  He has no shortness of breath no hemoptysis no fever ?He is retiring at the end of May of this year.  He now has 1 grandson.  He is excited about retirement. ?Hypertension, follow-up ? ?BP Readings from Last 3 Encounters:  ?05/27/21 (!) 160/78  ?02/07/21 140/80  ?11/26/20 (!) 143/79  ? Wt Readings from Last 3 Encounters:  ?05/27/21 221 lb (100.2 kg)  ?02/07/21 218 lb (98.9 kg)  ?11/26/20 220 lb (99.8 kg)  ?  ? ?He was last seen for hypertension 6 months ago.  ?BP at that visit was 143/79. ?Management since that visit includes; on HCTZ, losartan, and verapamil. ?He reports good compliance with treatment. ?He is not having side effects.  ?He is not exercising. ?He is adherent to low salt diet.   ?Outside blood pressures are 130's over 80's. ? ?He does not smoke. ? ?Use of agents associated with hypertension: none.  ? ?--------------------------------------------------------------------------------------------------- ?Patient also complains of continued cough.  He states that he has been on 2 rounds of antibiotics.  His cough is still productive but is cleat to white at this time. ? ?Medications: ?Outpatient Medications Prior to Visit  ?Medication Sig  ? albuterol (VENTOLIN HFA) 108  (90 Base) MCG/ACT inhaler Inhale 1-2 puffs into the lungs every 6 (six) hours as needed for wheezing or shortness of breath.   ? ALVESCO 160 MCG/ACT inhaler SMARTSIG:1 Inhalation Via Inhaler Twice Daily  ? Azelastine HCl 0.15 % SOLN Place into the nose.  ? budesonide-formoterol (SYMBICORT) 160-4.5 MCG/ACT inhaler Inhale 2 puffs into the lungs 2 (two) times daily.  ? Calcium Carb-Cholecalciferol (CALCIUM 600+D) 600-800 MG-UNIT TABS Take 1 tablet by mouth daily.  ? calcium carbonate (TUMS - DOSED IN MG ELEMENTAL CALCIUM) 500 MG chewable tablet Chew 500 mg by mouth daily as needed for indigestion or heartburn.  ? DEXILANT 60 MG capsule TAKE ONE CAPSULE BY MOUTH ONE TIME DAILY  ? dextromethorphan-guaiFENesin (MUCINEX DM) 30-600 MG 12hr tablet Take 1 tablet by mouth 2 (two) times daily as needed for cough.  ? dutasteride (AVODART) 0.5 MG capsule TAKE ONE CAPSULE BY MOUTH ONE TIME DAILY  ? famotidine (PEPCID) 40 MG tablet TAKE ONE TABLET BY MOUTH DAILY (Patient taking differently: Take 40 mg by mouth at bedtime.)  ? hydrochlorothiazide (HYDRODIURIL) 25 MG tablet TAKE ONE TABLET BY MOUTH ONE TIME DAILY  ? ketotifen (ZADITOR) 0.025 % ophthalmic solution Place 1 drop into both eyes daily.   ? losartan (COZAAR) 100 MG tablet TAKE ONE TABLET BY MOUTH ONE TIME DAILY  ? Misc Natural Products (GLUCOSAMINE CHONDROITIN TRIPLE) TABS Take 1 tablet by mouth 2 (two) times daily.  ? Multiple Vitamin (MULTI-VITAMINS) TABS Take 1 tablet by mouth daily.   ? Multiple Vitamins-Minerals (  PRESERVISION AREDS 2) CAPS Take 1 capsule by mouth 2 (two) times daily.  ? QNASL 80 MCG/ACT AERS Place 1 spray into both nostrils daily.   ? tadalafil (CIALIS) 5 MG tablet TAKE ONE TABLET BY MOUTH AT BEDTIME  ? tamsulosin (FLOMAX) 0.4 MG CAPS capsule Take 1 capsule (0.4 mg total) by mouth daily.  ? Testosterone 30 MG/ACT SOLN APPLY ONE SWIPE TO EACH AXILA DAILY  ? Turmeric 500 MG CAPS Take 500 mg by mouth 2 (two) times daily.   ? verapamil (CALAN-SR) 240 MG  CR tablet TAKE ONE TABLET BY MOUTH TWICE A DAY  ? ?No facility-administered medications prior to visit.  ? ? ?Review of Systems  ?Constitutional:  Negative for appetite change, chills and fever.  ?Respiratory:  Positive for cough and wheezing. Negative for chest tightness and shortness of breath.   ?Cardiovascular:  Positive for leg swelling (some). Negative for chest pain and palpitations.  ?Gastrointestinal:  Negative for abdominal pain, nausea and vomiting.  ? ?Last metabolic panel ?Lab Results  ?Component Value Date  ? GLUCOSE 93 11/30/2020  ? NA 142 11/30/2020  ? K 3.3 (L) 11/30/2020  ? CL 102 11/30/2020  ? CO2 24 11/30/2020  ? BUN 11 11/30/2020  ? CREATININE 0.98 11/30/2020  ? EGFR 86 11/30/2020  ? CALCIUM 8.6 11/30/2020  ? PHOS 2.8 07/26/2020  ? PROT 5.9 (L) 11/30/2020  ? ALBUMIN 4.2 11/30/2020  ? LABGLOB 1.7 11/30/2020  ? AGRATIO 2.5 (H) 11/30/2020  ? BILITOT 0.6 11/30/2020  ? ALKPHOS 83 11/30/2020  ? AST 17 11/30/2020  ? ALT 16 11/30/2020  ? ANIONGAP 11 03/29/2019  ? ?  ?  Objective  ?  ?BP (!) 160/78 (BP Location: Right Arm, Patient Position: Sitting, Cuff Size: Large)   Pulse 78   Temp 98.3 ?F (36.8 ?C) (Oral)   Wt 221 lb (100.2 kg)   SpO2 98%   BMI 29.97 kg/m?  ?BP Readings from Last 3 Encounters:  ?05/27/21 (!) 160/78  ?02/07/21 140/80  ?11/26/20 (!) 143/79  ? ?Wt Readings from Last 3 Encounters:  ?05/27/21 221 lb (100.2 kg)  ?02/07/21 218 lb (98.9 kg)  ?11/26/20 220 lb (99.8 kg)  ? ?  ? ?Physical Exam ?Vitals reviewed.  ?Constitutional:   ?   Appearance: He is well-developed.  ?   Comments: Mild  trunkal obesity  ?HENT:  ?   Head: Normocephalic and atraumatic.  ?   Right Ear: External ear normal.  ?   Left Ear: External ear normal.  ?   Nose: Nose normal.  ?Eyes:  ?   General: No scleral icterus. ?   Conjunctiva/sclera: Conjunctivae normal.  ?   Pupils: Pupils are equal, round, and reactive to light.  ?Neck:  ?   Thyroid: No thyromegaly.  ?Cardiovascular:  ?   Rate and Rhythm: Normal rate and  regular rhythm.  ?   Heart sounds: Normal heart sounds.  ?Pulmonary:  ?   Effort: Pulmonary effort is normal.  ?   Breath sounds: Rhonchi present.  ?   Comments: Rhonchi in both bases ?Abdominal:  ?   Palpations: Abdomen is soft.  ?Lymphadenopathy:  ?   Cervical: No cervical adenopathy.  ?Skin: ?   General: Skin is warm and dry.  ?Neurological:  ?   General: No focal deficit present.  ?   Mental Status: He is alert and oriented to person, place, and time.  ?Psychiatric:     ?   Behavior: Behavior normal.     ?  Thought Content: Thought content normal.     ?   Judgment: Judgment normal.  ?  ? ? ?No results found for any visits on 05/27/21. ? Assessment & Plan  ?  ? ?1. Essential hypertension ?Under fair control.  Patient to work on habits of diet and exercise when he retires in 3 months ?Coat hypertension with home blood pressures been about 138/70 the last few days ? ?2. Sinusitis, unspecified chronicity, unspecified location ?Add [facility] for coverage of ongoing symptomatic issues.  I think this should take care of it. ?- doxycycline (VIBRA-TABS) 100 MG tablet; Take 1 tablet (100 mg total) by mouth 2 (two) times daily.  Dispense: 10 tablet; Refill: 0 ? ?3. Chronic cough ?Doxycycline added.  Discussed possible chest x-ray.  I do not think he needs pulmonary referral. ? ?4. Essential (primary) hypertension ? ? ?5. RHINITIS ?Followed by Dr. Donneta Romberg from allergy ? ?6. Mild intermittent asthma with acute exacerbation ? ? ?7. Hypogonadism in male ? ? ? ?No follow-ups on file.  ?   ? ?I, Wilhemena Durie, MD, have reviewed all documentation for this visit. The documentation on 05/28/21 for the exam, diagnosis, procedures, and orders are all accurate and complete. ? ? ? ?Rether Rison Cranford Mon, MD  ?[facility] ?[phone] (phone) ?[phone] (fax) ? ? [facility] ?

## 2021-05-27 ENCOUNTER — Ambulatory Visit: Payer: BC Managed Care – PPO | Admitting: Family Medicine

## 2021-05-27 ENCOUNTER — Other Ambulatory Visit: Payer: Self-pay

## 2021-05-27 VITALS — BP 138/70 | HR 78 | Temp 98.3°F | Wt 221.0 lb

## 2021-05-27 DIAGNOSIS — I10 Essential (primary) hypertension: Secondary | ICD-10-CM

## 2021-05-27 DIAGNOSIS — R053 Chronic cough: Secondary | ICD-10-CM

## 2021-05-27 DIAGNOSIS — J310 Chronic rhinitis: Secondary | ICD-10-CM | POA: Diagnosis not present

## 2021-05-27 DIAGNOSIS — J329 Chronic sinusitis, unspecified: Secondary | ICD-10-CM | POA: Diagnosis not present

## 2021-05-27 DIAGNOSIS — J4521 Mild intermittent asthma with (acute) exacerbation: Secondary | ICD-10-CM

## 2021-05-27 DIAGNOSIS — E291 Testicular hypofunction: Secondary | ICD-10-CM

## 2021-05-27 MED ORDER — DOXYCYCLINE HYCLATE 100 MG PO TABS: 100.0000 mg | ORAL_TABLET | Freq: Two times a day (BID) | ORAL | 0 refills | Status: DC

## 2021-05-28 ENCOUNTER — Encounter: Payer: Self-pay | Admitting: Family Medicine

## 2021-06-29 ENCOUNTER — Other Ambulatory Visit: Payer: Self-pay | Admitting: Family Medicine

## 2021-06-29 DIAGNOSIS — I10 Essential (primary) hypertension: Secondary | ICD-10-CM

## 2021-07-07 ENCOUNTER — Other Ambulatory Visit: Payer: Self-pay | Admitting: Family Medicine

## 2021-07-07 DIAGNOSIS — I10 Essential (primary) hypertension: Secondary | ICD-10-CM

## 2021-07-13 ENCOUNTER — Other Ambulatory Visit: Payer: Self-pay | Admitting: Family Medicine

## 2021-07-15 NOTE — Telephone Encounter (Signed)
Requested medication (s) are due for refill today: yes ? ?Requested medication (s) are on the active medication list: yes ? ?Last refill:  01/13/21 #90/1 ? ?Future visit scheduled: yes ? ?Notes to clinic: Unable to refill per protocol due to failed labs, no updated results.  ? ? ?  ?Requested Prescriptions  ?Pending Prescriptions Disp Refills  ? hydrochlorothiazide (HYDRODIURIL) 25 MG tablet [Pharmacy Med Name: HYDROCHLOROTHIAZIDE 25 MG TAB[*]] 90 tablet 1  ?  Sig: TAKE ONE TABLET BY MOUTH ONE TIME DAILY  ?  ? Cardiovascular: Diuretics - Thiazide Failed - 07/13/2021  8:42 AM  ?  ?  Failed - Cr in normal range and within 180 days  ?  Creatinine, Ser  ?Date Value Ref Range Status  ?11/30/2020 0.98 0.76 - 1.27 mg/dL Final  ?  ?  ?  ?  Failed - K in normal range and within 180 days  ?  Potassium  ?Date Value Ref Range Status  ?11/30/2020 3.3 (L) 3.5 - 5.2 mmol/L Final  ?  ?  ?  ?  Failed - Na in normal range and within 180 days  ?  Sodium  ?Date Value Ref Range Status  ?11/30/2020 142 134 - 144 mmol/L Final  ?  ?  ?  ?  Passed - Last BP in normal range  ?  BP Readings from Last 1 Encounters:  ?05/28/21 138/70  ?  ?  ?  ?  Passed - Valid encounter within last 6 months  ?  Recent Outpatient Visits   ? ?      ? 1 month ago Essential hypertension  ? [facility] Maple Hudson., MD  ? 2 months ago Moderate persistent asthma with exacerbation  ? [facility] [place], Marzella Schlein, MD  ? 7 months ago Annual physical exam  ? [facility] Maple Hudson., MD  ? 1 year ago Essential (primary) hypertension  ? [facility] Maple Hudson., MD  ? 1 year ago Acute left ankle pain  ? [facility] Maple Hudson., MD  ? ?  ?  ?Future Appointments   ? ?        ? In 2 months Stoioff, Verna Czech, MD [facility]  ? In 3 months Maple Hudson., MD [facility]  ? ?  ? ? ?  ?  ?  ? ?

## 2021-09-06 DIAGNOSIS — H353121 Nonexudative age-related macular degeneration, left eye, early dry stage: Secondary | ICD-10-CM | POA: Diagnosis not present

## 2021-09-12 ENCOUNTER — Other Ambulatory Visit: Payer: Self-pay | Admitting: *Deleted

## 2021-09-12 DIAGNOSIS — E291 Testicular hypofunction: Secondary | ICD-10-CM

## 2021-09-12 DIAGNOSIS — N401 Enlarged prostate with lower urinary tract symptoms: Secondary | ICD-10-CM

## 2021-09-15 ENCOUNTER — Other Ambulatory Visit: Payer: Self-pay | Admitting: Family Medicine

## 2021-09-15 DIAGNOSIS — K219 Gastro-esophageal reflux disease without esophagitis: Secondary | ICD-10-CM

## 2021-09-17 ENCOUNTER — Other Ambulatory Visit: Payer: Medicare Other

## 2021-09-17 DIAGNOSIS — N401 Enlarged prostate with lower urinary tract symptoms: Secondary | ICD-10-CM

## 2021-09-17 DIAGNOSIS — E291 Testicular hypofunction: Secondary | ICD-10-CM

## 2021-09-18 ENCOUNTER — Encounter: Payer: Self-pay | Admitting: Urology

## 2021-09-18 ENCOUNTER — Ambulatory Visit: Payer: Medicare Other | Admitting: Urology

## 2021-09-18 VITALS — BP 120/71 | HR 75 | Ht 72.0 in | Wt 220.0 lb

## 2021-09-18 DIAGNOSIS — N401 Enlarged prostate with lower urinary tract symptoms: Secondary | ICD-10-CM | POA: Diagnosis not present

## 2021-09-18 DIAGNOSIS — N5201 Erectile dysfunction due to arterial insufficiency: Secondary | ICD-10-CM | POA: Diagnosis not present

## 2021-09-18 DIAGNOSIS — E291 Testicular hypofunction: Secondary | ICD-10-CM

## 2021-09-18 LAB — BLADDER SCAN AMB NON-IMAGING: Scan Result: 40

## 2021-09-18 LAB — HEMATOCRIT: Hematocrit: 42.2 % (ref 37.5–51.0)

## 2021-09-18 LAB — PSA: Prostate Specific Ag, Serum: 0.4 ng/mL (ref 0.0–4.0)

## 2021-09-18 LAB — TESTOSTERONE: Testosterone: 468 ng/dL (ref 264–916)

## 2021-09-18 MED ORDER — TESTOSTERONE 20.25 MG/ACT (1.62%) TD GEL: TRANSDERMAL | 2 refills | Status: DC

## 2021-09-18 MED ORDER — TADALAFIL 5 MG PO TABS: 5.0000 mg | ORAL_TABLET | Freq: Every day | ORAL | 3 refills | Status: DC

## 2021-09-18 MED ORDER — TAMSULOSIN HCL 0.4 MG PO CAPS: 0.4000 mg | ORAL_CAPSULE | Freq: Every day | ORAL | 3 refills | Status: DC

## 2021-09-18 MED ORDER — DUTASTERIDE 0.5 MG PO CAPS: 0.5000 mg | ORAL_CAPSULE | Freq: Every day | ORAL | 3 refills | Status: DC

## 2021-09-18 NOTE — Progress Notes (Signed)
09/18/2021 4:51 PM   [name] [name] [birth year] [id number]  Referring provider: Maple Hudson., MD [address] [place],  [place] [address]  Chief Complaint  Patient presents with   Hypogonadism    Urologic history: 1.  Hypogonadism -On topical TRT   2.  BPH with LUTS -Dutasteride/tamsulosin   3.  Erectile dysfunction -Tadalafil  HPI: 66 y.o. male presents for follow-up.  Retired since his last visit.  Recently changed to Medicare and his testosterone gel was not covered and he recently discontinued. Labs 09/17/2021: Testosterone 468; PSA 0.4; hematocrit 42.2 Stable LUTS on dutasteride/tamsulosin Minimally invasive procedures were discussed at last years visit Remains on tadalafil for ED/BPH   PMH: Past Medical History:  Diagnosis Date   Allergy    Asthma    BP (high blood pressure) 11/07/2014   Should stop combination of [place] (DRI)  and ARB    Hypertension    HYPERTENSION, SEVERE 03/15/2007   Qualifier: Diagnosis of  By: Vernie Murders     Prostate enlargement    Sleep apnea     Surgical History: Past Surgical History:  Procedure Laterality Date   APPENDECTOMY  1990   Dr Lemar Livings   COLONOSCOPY  2008   Dr Lemar Livings   COLONOSCOPY WITH PROPOFOL N/A 08/13/2016   Procedure: COLONOSCOPY WITH PROPOFOL;  Surgeon: Earline Mayotte, MD;  Location: [facility];  Service: Endoscopy;  Laterality: N/A;   HEMORRHOID SURGERY     HERNIA REPAIR  01/20/14   umbilical hernia   KNEE ARTHROSCOPY Left    KNEE SURGERY Left 1986   NASAL SEPTUM SURGERY     TONSILLECTOMY     TOTAL KNEE ARTHROPLASTY Left 03/29/2019   Procedure: TOTAL KNEE ARTHROPLASTY;  Surgeon: Christena Flake, MD;  Location: [facility] ORS;  Service: Orthopedics;  Laterality: Left;   VASECTOMY      Home Medications:  Allergies as of 09/18/2021       Reactions   Hydralazine Hives, Shortness Of Breath   Chest tightness, severe headache, dyspnea Other reaction(s): Chest Pain,  Headache, Respiratory Distress   Gluten Meal Diarrhea   bloating   Moxifloxacin Hives   Avelox   Pseudoephedrine Other (See Comments)   Confusion, passed out, numbness   Sulfa Antibiotics Hives   Sulfonamide Derivatives Hives        Medication List        Accurate as of September 18, 2021  4:51 PM. If you have any questions, ask your nurse or doctor.          albuterol 108 (90 Base) MCG/ACT inhaler Commonly known as: VENTOLIN HFA Inhale 1-2 puffs into the lungs every 6 (six) hours as needed for wheezing or shortness of breath.   Alvesco 160 MCG/ACT inhaler Generic drug: ciclesonide SMARTSIG:1 Inhalation Via Inhaler Twice Daily   budesonide-formoterol 160-4.5 MCG/ACT inhaler Commonly known as: SYMBICORT Inhale 2 puffs into the lungs 2 (two) times daily.   Calcium 600+D 600-20 MG-MCG Tabs Generic drug: Calcium Carb-Cholecalciferol Take 1 tablet by mouth daily.   calcium carbonate 500 MG chewable tablet Commonly known as: TUMS - dosed in mg elemental calcium Chew 500 mg by mouth daily as needed for indigestion or heartburn.   Dexilant 60 MG capsule Generic drug: dexlansoprazole TAKE ONE CAPSULE BY MOUTH ONE TIME DAILY   dextromethorphan-guaiFENesin 30-600 MG 12hr tablet Commonly known as: MUCINEX DM Take 1 tablet by mouth 2 (two) times daily as needed for cough.   doxycycline 100 MG tablet Commonly known  as: VIBRA-TABS Take 1 tablet (100 mg total) by mouth 2 (two) times daily.   dutasteride 0.5 MG capsule Commonly known as: AVODART TAKE ONE CAPSULE BY MOUTH ONE TIME DAILY   famotidine 40 MG tablet Commonly known as: PEPCID TAKE ONE TABLET BY MOUTH DAILY What changed: when to take this   Glucosamine Chondroitin Triple Tabs Take 1 tablet by mouth 2 (two) times daily.   hydrochlorothiazide 25 MG tablet Commonly known as: HYDRODIURIL TAKE ONE TABLET BY MOUTH ONE TIME DAILY   ketotifen 0.025 % ophthalmic solution Commonly known as: ZADITOR Place 1 drop into  both eyes daily.   losartan 100 MG tablet Commonly known as: COZAAR TAKE ONE TABLET BY MOUTH ONE TIME DAILY   Multi-Vitamins Tabs Take 1 tablet by mouth daily.   PreserVision AREDS 2 Caps Take 1 capsule by mouth 2 (two) times daily.   Qnasl 80 MCG/ACT Aers Generic drug: Beclomethasone Dipropionate Place 1 spray into both nostrils daily.   tadalafil 5 MG tablet Commonly known as: CIALIS TAKE ONE TABLET BY MOUTH AT BEDTIME   tamsulosin 0.4 MG Caps capsule Commonly known as: FLOMAX Take 1 capsule (0.4 mg total) by mouth daily.   Testosterone 30 MG/ACT Soln APPLY ONE SWIPE TO EACH AXILA DAILY   Turmeric 500 MG Caps Take 500 mg by mouth 2 (two) times daily.   verapamil 240 MG CR tablet Commonly known as: CALAN-SR TAKE ONE TABLET BY MOUTH TWICE A DAY        Allergies:  Allergies  Allergen Reactions   Hydralazine Hives and Shortness Of Breath    Chest tightness, severe headache, dyspnea Other reaction(s): Chest Pain, Headache, Respiratory Distress   Gluten Meal Diarrhea    bloating   Moxifloxacin Hives    Avelox   Pseudoephedrine Other (See Comments)    Confusion, passed out, numbness   Sulfa Antibiotics Hives   Sulfonamide Derivatives Hives    Family History: Family History  Problem Relation Age of Onset   Cancer Mother        ovarian   Glaucoma Mother    Cancer Father        prostate   Hypertension Sister    Asthma Sister    Alcohol abuse Paternal Uncle    Liver disease Paternal Uncle    Cancer - Prostate Paternal Uncle    Cancer Maternal Uncle        lung; two mat uncles   Cancer Paternal Grandmother        breast   Cancer Paternal Aunt        lung cancer in a smoker    Social History:  reports that he has never smoked. He has never used smokeless tobacco. He reports current alcohol use. He reports that he does not use drugs.   Physical Exam: BP 120/71   Pulse 75   Ht 6' (1.829 m)   Wt 220 lb (99.8 kg)   BMI 29.84 kg/m   Constitutional:   Alert, No acute distress. HEENT: [place] AT Respiratory: Normal respiratory effort, no increased work of breathing. Neurologic: Grossly intact, no focal deficits, moving all 4 extremities. Psychiatric: Normal mood and affect.   Assessment & Plan:    1.  Hypogonadism He has not checked the good Rx price for testosterone He did request Rx be sent and if affordable he would restart Lab visit testosterone/hematocrit 6 months Office visit 1 year testosterone/hematocrit/PSA and DRE  2.  BPH with moderate LUTS Minimal invasive procedures were again reviewed including  prostatic artery embolization He desires to continue medical management PVR today 40 cc Tamsulosin/dutasteride refilled  3.  Erectile dysfunction Stable on tadalafil; refill sent   Riki Altes, MD  [facility] [address] [place], [place] [address] [phone]

## 2021-09-19 ENCOUNTER — Ambulatory Visit: Payer: BC Managed Care – PPO | Admitting: Urology

## 2021-09-26 ENCOUNTER — Encounter: Payer: Self-pay | Admitting: Family Medicine

## 2021-09-26 ENCOUNTER — Ambulatory Visit
Admission: RE | Admit: 2021-09-26 | Discharge: 2021-09-26 | Disposition: A | Discharge status: Home / Self Care | Payer: Medicare Other | Source: Ambulatory Visit | Attending: Family Medicine | Admitting: Family Medicine

## 2021-09-26 ENCOUNTER — Ambulatory Visit (INDEPENDENT_AMBULATORY_CARE_PROVIDER_SITE_OTHER): Payer: Medicare Other | Admitting: Family Medicine

## 2021-09-26 ENCOUNTER — Ambulatory Visit
Admission: RE | Admit: 2021-09-26 | Discharge: 2021-09-26 | Disposition: A | Discharge status: Home / Self Care | Payer: Medicare Other | Attending: Family Medicine | Admitting: Family Medicine

## 2021-09-26 VITALS — BP 136/76 | HR 74 | Temp 98.3°F | Resp 16 | Wt 220.0 lb

## 2021-09-26 DIAGNOSIS — J4541 Moderate persistent asthma with (acute) exacerbation: Secondary | ICD-10-CM

## 2021-09-26 DIAGNOSIS — R059 Cough, unspecified: Secondary | ICD-10-CM | POA: Diagnosis not present

## 2021-09-26 DIAGNOSIS — J4550 Severe persistent asthma, uncomplicated: Secondary | ICD-10-CM | POA: Insufficient documentation

## 2021-09-26 MED ORDER — LEVOFLOXACIN 500 MG PO TABS: 500.0000 mg | ORAL_TABLET | Freq: Every day | ORAL | 0 refills | Status: AC

## 2021-09-26 MED ORDER — ALVESCO 160 MCG/ACT IN AERS: 1.0000 | INHALATION_SPRAY | Freq: Two times a day (BID) | RESPIRATORY_TRACT | 11 refills | Status: DC

## 2021-09-26 MED ORDER — PREDNISONE 10 MG PO TABS: ORAL_TABLET | ORAL | 0 refills | Status: DC

## 2021-09-26 MED ORDER — SODIUM CHLORIDE 3 % IN NEBU: INHALATION_SOLUTION | RESPIRATORY_TRACT | 12 refills | Status: DC | PRN

## 2021-09-26 NOTE — Assessment & Plan Note (Signed)
Acute on chronic, last on steroids and Abx in March Repeat CXR  Refer to pulm Encourage chest PT Denies sick contacts Denies exposure to COVID Seek emergent contact if necessary

## 2021-09-26 NOTE — Progress Notes (Signed)
I,Sulibeya S Dimas,acting as a Neurosurgeon for Jacky Kindle, FNP.,have documented all relevant documentation on the behalf of Jacky Kindle, FNP,as directed by  Jacky Kindle, FNP while in the presence of Jacky Kindle, FNP.   Established patient visit   Patient: [name] [name]   DOB: [birth year]   66 y.o. Male  MRN: [id number] Visit Date: 09/26/2021  Today's healthcare provider: Jacky Kindle, FNP  Introduced to nurse practitioner role and practice setting.  All questions answered.  Discussed provider/patient relationship and expectations.   Chief Complaint  Patient presents with   Cough   Subjective    Cough Associated symptoms include shortness of breath and wheezing. Pertinent negatives include no chills, ear pain, rhinorrhea or sore throat.    Upper respiratory symptoms He complains of productive cough with  green colored sputum.with no fever, chills, night sweats or weight loss. Onset of symptoms was a few days ago and rapidly worsening.He is drinking plenty of fluids.  Past history is significant for asthma and occasional episodes of bronchitis. Patient is non-smoker  ---------------------------------------------------------------------------------------------------   Medications: Outpatient Medications Prior to Visit  Medication Sig   albuterol (VENTOLIN HFA) 108 (90 Base) MCG/ACT inhaler Inhale 1-2 puffs into the lungs every 6 (six) hours as needed for wheezing or shortness of breath.    budesonide-formoterol (SYMBICORT) 160-4.5 MCG/ACT inhaler Inhale 2 puffs into the lungs 2 (two) times daily.   Calcium Carb-Cholecalciferol (CALCIUM 600+D) 600-800 MG-UNIT TABS Take 1 tablet by mouth daily.   calcium carbonate (TUMS - DOSED IN MG ELEMENTAL CALCIUM) 500 MG chewable tablet Chew 500 mg by mouth daily as needed for indigestion or heartburn.   DEXILANT 60 MG capsule TAKE ONE CAPSULE BY MOUTH ONE TIME DAILY   dextromethorphan-guaiFENesin (MUCINEX DM) 30-600 MG 12hr  tablet Take 1 tablet by mouth 2 (two) times daily as needed for cough.   dutasteride (AVODART) 0.5 MG capsule Take 1 capsule (0.5 mg total) by mouth daily.   famotidine (PEPCID) 40 MG tablet TAKE ONE TABLET BY MOUTH DAILY (Patient taking differently: Take 40 mg by mouth at bedtime.)   hydrochlorothiazide (HYDRODIURIL) 25 MG tablet TAKE ONE TABLET BY MOUTH ONE TIME DAILY   ketotifen (ZADITOR) 0.025 % ophthalmic solution Place 1 drop into both eyes daily.    losartan (COZAAR) 100 MG tablet TAKE ONE TABLET BY MOUTH ONE TIME DAILY   Misc Natural Products (GLUCOSAMINE CHONDROITIN TRIPLE) TABS Take 1 tablet by mouth 2 (two) times daily.   Multiple Vitamin (MULTI-VITAMINS) TABS Take 1 tablet by mouth daily.    Multiple Vitamins-Minerals (PRESERVISION AREDS 2) CAPS Take 1 capsule by mouth 2 (two) times daily.   QNASL 80 MCG/ACT AERS Place 1 spray into both nostrils daily.    tadalafil (CIALIS) 5 MG tablet Take 1 tablet (5 mg total) by mouth at bedtime.   tamsulosin (FLOMAX) 0.4 MG CAPS capsule Take 1 capsule (0.4 mg total) by mouth daily.   Testosterone 20.25 MG/ACT (1.62%) GEL 1 pump applied to each shoulder daily   Turmeric 500 MG CAPS Take 500 mg by mouth 2 (two) times daily.    verapamil (CALAN-SR) 240 MG CR tablet TAKE ONE TABLET BY MOUTH TWICE A DAY   ALVESCO 160 MCG/ACT inhaler SMARTSIG:1 Inhalation Via Inhaler Twice Daily (Patient not taking: Reported on 09/26/2021)   [DISCONTINUED] doxycycline (VIBRA-TABS) 100 MG tablet Take 1 tablet (100 mg total) by mouth 2 (two) times daily.   No facility-administered medications prior to visit.  Review of Systems  Constitutional:  Negative for appetite change, chills and fatigue.  HENT:  Negative for ear pain, rhinorrhea, sinus pressure, sinus pain and sore throat.   Respiratory:  Positive for cough, chest tightness, shortness of breath and wheezing.   Gastrointestinal:  Negative for abdominal pain, constipation, diarrhea, nausea and vomiting.         Objective    BP 136/76 (BP Location: Right Arm, Patient Position: Sitting, Cuff Size: Large)   Pulse 74   Temp 98.3 F (36.8 C) (Oral)   Resp 16   Wt 220 lb (99.8 kg)   SpO2 98%   BMI 29.84 kg/m  BP Readings from Last 3 Encounters:  09/26/21 136/76  09/18/21 120/71  05/28/21 138/70   Wt Readings from Last 3 Encounters:  09/26/21 220 lb (99.8 kg)  09/18/21 220 lb (99.8 kg)  05/27/21 221 lb (100.2 kg)      Physical Exam Vitals and nursing note reviewed.  Constitutional:      Appearance: Normal appearance. He is obese.  HENT:     Head: Normocephalic and atraumatic.  Eyes:     Pupils: Pupils are equal, round, and reactive to light.  Cardiovascular:     Rate and Rhythm: Normal rate and regular rhythm.     Pulses: Normal pulses.     Heart sounds: Normal heart sounds.  Pulmonary:     Effort: Pulmonary effort is normal.     Breath sounds: Wheezing and rhonchi present.  Chest:     Chest wall: Tenderness present.  Musculoskeletal:        General: Normal range of motion.     Cervical back: Normal range of motion.  Skin:    General: Skin is warm and dry.     Capillary Refill: Capillary refill takes less than 2 seconds.  Neurological:     General: No focal deficit present.     Mental Status: He is alert and oriented to person, place, and time. Mental status is at baseline.  Psychiatric:        Mood and Affect: Mood normal.        Behavior: Behavior normal.        Thought Content: Thought content normal.        Judgment: Judgment normal.      No results found for any visits on 09/26/21.  Assessment & Plan     Problem List Items Addressed This Visit       Respiratory   Moderate persistent asthma with exacerbation - Primary    Acute on chronic, last on steroids and Abx in March Repeat CXR  Refer to pulm Encourage chest PT Denies sick contacts Denies exposure to COVID Seek emergent contact if necessary       Relevant Medications   ciclesonide (ALVESCO)  160 MCG/ACT inhaler   predniSONE (DELTASONE) 10 MG tablet   Other Relevant Orders   DG Chest 2 View   Ambulatory referral to Pulmonology     Return if symptoms worsen or fail to improve.       Jacky Kindle, FNP  [facility] [phone] (phone) [phone] (fax)  [facility]

## 2021-09-30 ENCOUNTER — Other Ambulatory Visit: Payer: Self-pay

## 2021-09-30 NOTE — Progress Notes (Signed)
Xray is normal. Heart and lungs are stable.  Jacky Kindle, FNP  [facility] [address] [place], [place] [address] [phone] (phone) [phone] (fax) [facility]

## 2021-10-03 ENCOUNTER — Ambulatory Visit: Payer: Self-pay | Admitting: Urology

## 2021-10-28 ENCOUNTER — Ambulatory Visit: Payer: BC Managed Care – PPO | Admitting: Family Medicine

## 2021-11-08 ENCOUNTER — Ambulatory Visit: Payer: Self-pay

## 2021-11-08 ENCOUNTER — Telehealth: Payer: Self-pay | Admitting: Family Medicine

## 2021-11-08 MED ORDER — ALBUTEROL SULFATE HFA 108 (90 BASE) MCG/ACT IN AERS: 1.0000 | INHALATION_SPRAY | Freq: Four times a day (QID) | RESPIRATORY_TRACT | 0 refills | Status: DC | PRN

## 2021-11-08 NOTE — Telephone Encounter (Signed)
Done

## 2021-11-08 NOTE — Telephone Encounter (Signed)
Medication Refill - Medication: Pro air rescue inhaler  Has the patient contacted their pharmacy? No. Pt states he used to get from a [facility] dr, but does not see that dr anymore. He said he saw Robynn Pane last and she asked him if he needed a rescue inhaler, but he said no.  But now he does.  And wants it today.  Preferred Pharmacy (with phone number or street name): [facility] [address] [facility] - [place], [place] - [address] AT [address] Has the patient been seen for an appointment in the last year OR does the patient have an upcoming appointment? Yes.    Agent: Please be advised that RX refills may take up to 3 business days. We ask that you follow-up with your pharmacy.

## 2021-11-08 NOTE — Patient Outreach (Signed)
  Care Coordination   Initial Visit Note   11/08/2021 Name: [name] [name] MRN: [id number] DOB: 1955-10-24  MACDONALD RIGOR is a 66 y.o. year old male who sees Maple Hudson., MD for primary care. I spoke with  Precious Haws by phone today.  What matters to the patients health and wellness today?  Preventing exacerbations of  his Asthma and feels bronchitis is sill present    Goals Addressed             This Visit's Progress    COMPLETED: RNCM: Effective Management of Asthma       Care Coordination Interventions: Provided patient with basic written and verbal Asthma education on self care/management/and exacerbation prevention. The patient will see pulmonology on 11-19-2021. The patient has sent a message to the office asking about a rescue inhaler as he is having some sx and sx. He states he had bronchitis a while back and it still seems to be lingering. Education and support given Advised patient to track and manage Asthma triggers. Feels that the weather changes and possible need for hiatal hernia repair due to bad reflux could be also impacting his respiratory status Provided instruction about proper use of medications used for management of Asthma including inhalers. The patient has sent a message today asking for assistance with getting a rescue inhaler. Advised the patient it may take up to several days due to the holiday weekend coming up. The patient verbalized understanding. Advised patient to self assesses Asthma action plan zone and make appointment with provider if in the yellow zone for 48 hours without improvement. Has an appointment on 11-19-2021 with pulmonologist Provided education about and advised patient to utilize infection prevention strategies to reduce risk of respiratory infection Screening for signs and symptoms of depression related to chronic disease state  Assessed social determinant of health barriers Provided education about the  care coordination program and the [facility] being available to assist with any questions, concerns, or needs with chronic conditions and effective management of health and well being           SDOH assessments and interventions completed:  Yes  SDOH Interventions Today    Flowsheet Row Most Recent Value  SDOH Interventions   Housing Interventions Intervention Not Indicated  Transportation Interventions Intervention Not Indicated        Care Coordination Interventions Activated:  Yes  Care Coordination Interventions:  Yes, provided   Follow up plan: No further intervention required.   Encounter Outcome:  Pt. Visit Completed  Alto Denver RN, MSN, CCM Community Care Coordinator [facility]  [facility] Mobile: [phone]

## 2021-11-08 NOTE — Patient Instructions (Signed)
Visit Information  Thank you for taking time to visit with me today. Please don't hesitate to contact me if I can be of assistance to you.   Following are the goals we discussed today:   Goals Addressed             This Visit's Progress    COMPLETED: RNCM: Effective Management of Asthma       Care Coordination Interventions: Provided patient with basic written and verbal Asthma education on self care/management/and exacerbation prevention. The patient will see pulmonology on 11-19-2021. The patient has sent a message to the office asking about a rescue inhaler as he is having some sx and sx. He states he had bronchitis a while back and it still seems to be lingering. Education and support given Advised patient to track and manage Asthma triggers. Feels that the weather changes and possible need for hiatal hernia repair due to bad reflux could be also impacting his respiratory status Provided instruction about proper use of medications used for management of Asthma including inhalers. The patient has sent a message today asking for assistance with getting a rescue inhaler. Advised the patient it may take up to several days due to the holiday weekend coming up. The patient verbalized understanding. Advised patient to self assesses Asthma action plan zone and make appointment with provider if in the yellow zone for 48 hours without improvement. Has an appointment on 11-19-2021 with pulmonologist Provided education about and advised patient to utilize infection prevention strategies to reduce risk of respiratory infection Screening for signs and symptoms of depression related to chronic disease state  Assessed social determinant of health barriers Provided education about the care coordination program and the [facility] being available to assist with any questions, concerns, or needs with chronic conditions and effective management of health and well being             Please call the care guide  team at [phone] if you need to schedule an appointment.   If you are experiencing a Mental Health or Behavioral Health Crisis or need someone to talk to, please call the Suicide and Crisis Lifeline: 988 call the [place] National Suicide Prevention Lifeline: 2696223792 or TTY: 6691503478 TTY 385-122-7362) to talk to a trained counselor call 1-800-273-TALK (toll free, 24 hour hotline)  Patient verbalizes understanding of instructions and care plan provided today and agrees to view in MyChart. Active MyChart status and patient understanding of how to access instructions and care plan via MyChart confirmed with patient.     No further follow up required: the patient knows to call the [facility] for new needs  Alto Denver RN, MSN, CCM Community Care Coordinator [facility]  [facility] Mobile: [phone]    Asthma Action Plan, Adult An asthma action plan helps you understand how to manage your asthma and what to do when you have an asthma attack. The action plan is a color-coded plan that lists the symptoms that indicate whether or not your condition is under control and what actions to take. If you have symptoms in the green zone, you are doing well. If you have symptoms in the yellow zone, you are having problems. If you have symptoms in the red zone, you need medical care right away. Follow the plan that you and your health care provider develop. Review your plan with your health care provider at each visit. What triggers your asthma? Knowing the things that can trigger an asthma attack or make your asthma symptoms  worse is very important. Talk to your health care provider about your asthma triggers and how to avoid them. Record your known asthma triggers here: _______________ What is your personal best peak flow reading? If you use a peak flow meter, determine your personal best reading. Record it here: _______________ [place] zone This zone means that your asthma is under  control. You may not have any symptoms while you are in the green zone. This means that you: Have no coughing or wheezing, even while you are working or playing. Sleep through the night. Are breathing well. Have a peak flow reading that is above __________ (80% of your personal best or greater). If you are in the green zone, continue to manage your asthma as directed. Take these medicines every day: Controller medicine and dosage: _______________ Controller medicine and dosage: _______________ Controller medicine and dosage: _______________ Controller medicine and dosage: _______________ Before exercise, use this reliever or rescue medicine: _______________ Call your health care provider if you are using a reliever or rescue medicine more than 2-3 times a week. Yellow zone Symptoms in this zone mean that your condition may be getting worse. You may have symptoms that interfere with exercise, are noticeably worse after exposure to triggers, or are worse at the first sign of a cold (upper respiratory infection). These may include: Waking from sleep. Coughing, especially at night or first thing in the morning. Mild wheezing. Chest tightness. A peak flow reading that is __________ to __________ (50-79% of your personal best). If you have any of these symptoms: Add the following medicine to the ones that you use daily: Reliever or rescue medicine and dosage: _______________ Additional medicine and dosage: _______________ Call your health care provider if: You remain in the yellow zone for __________ hours. You are using a reliever or rescue medicine more than 2-3 times a week. Red zone Symptoms in this zone mean that you should get medical help right away. You will likely feel distressed and have symptoms at rest that restrict your activity. You are in the red zone if: You are breathing hard and quickly. Your nose opens wide, your ribs show, and your neck muscles become visible when you  breathe in. Your lips, fingers, or toes are a bluish color. You have trouble speaking in full sentences. Your peak flow reading is less than __________ (less than 50% of your personal best). Your symptoms do not improve within 15-20 minutes after you use your reliever or rescue medicine (bronchodilator). If you have any of these symptoms: These symptoms represent a serious problem that is an emergency. Do not wait to see if the symptoms will go away. Get medical help right away. Call your local emergency services (911 in the [place] Do not drive yourself to the hospital. Use your reliever or rescue medicine. Start a nebulizer treatment or take 2-4 puffs from a metered-dose inhaler with a spacer. Repeat this action every 15-20 minutes until help arrives. Where to find more information You can find more information about asthma from: Centers for Disease Control and Prevention: [employer]Exhibition.com.br American Lung Association: www.lung.org This information is not intended to replace advice given to you by your health care provider. Make sure you discuss any questions you have with your health care provider. Document Revised: 04/19/2020 Document Reviewed: 04/19/2020 Elsevier Patient Education  2023 [employer].

## 2021-11-14 ENCOUNTER — Ambulatory Visit (INDEPENDENT_AMBULATORY_CARE_PROVIDER_SITE_OTHER): Payer: Medicare Other | Admitting: Family Medicine

## 2021-11-14 ENCOUNTER — Encounter: Payer: Self-pay | Admitting: Family Medicine

## 2021-11-14 VITALS — BP 109/46 | HR 87 | Resp 16 | Wt 213.0 lb

## 2021-11-14 DIAGNOSIS — J189 Pneumonia, unspecified organism: Secondary | ICD-10-CM | POA: Diagnosis not present

## 2021-11-14 DIAGNOSIS — J4521 Mild intermittent asthma with (acute) exacerbation: Secondary | ICD-10-CM | POA: Diagnosis not present

## 2021-11-14 DIAGNOSIS — R053 Chronic cough: Secondary | ICD-10-CM | POA: Diagnosis not present

## 2021-11-14 MED ORDER — DOXYCYCLINE HYCLATE 100 MG PO TABS: 100.0000 mg | ORAL_TABLET | Freq: Two times a day (BID) | ORAL | 1 refills | Status: DC

## 2021-11-14 MED ORDER — PREDNISONE 10 MG (21) PO TBPK: ORAL_TABLET | ORAL | 1 refills | Status: DC

## 2021-11-14 NOTE — Progress Notes (Unsigned)
Atypical      Established patient visit  I,April Miller,acting as a scribe for Megan Mans, MD.,have documented all relevant documentation on the behalf of Megan Mans, MD,as directed by  Megan Mans, MD while in the presence of Megan Mans, MD.   Patient: [name] [name]   DOB: 07-16-1955   66 y.o. Male  MRN: [id number] Visit Date: 11/14/2021  Today's healthcare provider: Megan Mans, MD   Chief Complaint  Patient presents with   Cough   Subjective    Cough Pertinent negatives include no chest pain, chills, fever, shortness of breath or wheezing.    Patient states he had bronchitis a month ago. Patient states his cough has lingered. Cough is now producing green mucous. No fever.  Medications: Outpatient Medications Prior to Visit  Medication Sig   albuterol (VENTOLIN HFA) 108 (90 Base) MCG/ACT inhaler Inhale 1-2 puffs into the lungs every 6 (six) hours as needed for wheezing or shortness of breath.   budesonide-formoterol (SYMBICORT) 160-4.5 MCG/ACT inhaler Inhale 2 puffs into the lungs 2 (two) times daily.   Calcium Carb-Cholecalciferol (CALCIUM 600+D) 600-800 MG-UNIT TABS Take 1 tablet by mouth daily.   calcium carbonate (TUMS - DOSED IN MG ELEMENTAL CALCIUM) 500 MG chewable tablet Chew 500 mg by mouth daily as needed for indigestion or heartburn.   DEXILANT 60 MG capsule TAKE ONE CAPSULE BY MOUTH ONE TIME DAILY   dextromethorphan-guaiFENesin (MUCINEX DM) 30-600 MG 12hr tablet Take 1 tablet by mouth 2 (two) times daily as needed for cough.   dutasteride (AVODART) 0.5 MG capsule Take 1 capsule (0.5 mg total) by mouth daily.   famotidine (PEPCID) 40 MG tablet TAKE ONE TABLET BY MOUTH DAILY (Patient taking differently: Take 40 mg by mouth at bedtime.)   hydrochlorothiazide (HYDRODIURIL) 25 MG tablet TAKE ONE TABLET BY MOUTH ONE TIME DAILY   ketotifen (ZADITOR) 0.025 % ophthalmic solution Place 1 drop into both eyes daily.    losartan  (COZAAR) 100 MG tablet TAKE ONE TABLET BY MOUTH ONE TIME DAILY   Misc Natural Products (GLUCOSAMINE CHONDROITIN TRIPLE) TABS Take 1 tablet by mouth 2 (two) times daily.   Multiple Vitamin (MULTI-VITAMINS) TABS Take 1 tablet by mouth daily.    Multiple Vitamins-Minerals (PRESERVISION AREDS 2) CAPS Take 1 capsule by mouth 2 (two) times daily.   tadalafil (CIALIS) 5 MG tablet Take 1 tablet (5 mg total) by mouth at bedtime.   tamsulosin (FLOMAX) 0.4 MG CAPS capsule Take 1 capsule (0.4 mg total) by mouth daily.   Testosterone 20.25 MG/ACT (1.62%) GEL 1 pump applied to each shoulder daily   Turmeric 500 MG CAPS Take 500 mg by mouth 2 (two) times daily.    verapamil (CALAN-SR) 240 MG CR tablet TAKE ONE TABLET BY MOUTH TWICE A DAY   [DISCONTINUED] ALVESCO 160 MCG/ACT inhaler SMARTSIG:1 Inhalation Via Inhaler Twice Daily (Patient not taking: Reported on 09/26/2021)   [DISCONTINUED] ciclesonide (ALVESCO) 160 MCG/ACT inhaler Inhale 1 puff into the lungs 2 (two) times daily. (Patient not taking: Reported on 11/14/2021)   [DISCONTINUED] predniSONE (DELTASONE) 10 MG tablet Day 1 take 6 tablets Day 2 take 6 tablets Day 3 take 5 tablets Day 4 take 5 tablets Day 5 take 4 tablets Day 6 take 4 tablets Day 7 take 3 tablets Day 8 take 3 tablets Day 9 take 2 tablets Day 10 take 2 tablets Day 11 take 1 tablet Day 12 take 1 tablet Day 13 take 1/2 tablet Day 14 take 1/2  tablet (Patient not taking: Reported on 11/14/2021)   [DISCONTINUED] QNASL 80 MCG/ACT AERS Place 1 spray into both nostrils daily.  (Patient not taking: Reported on 11/14/2021)   [DISCONTINUED] sodium chloride HYPERTONIC 3 % nebulizer solution Take by nebulization as needed for cough. (Patient not taking: Reported on 11/14/2021)   No facility-administered medications prior to visit.    Review of Systems  Constitutional:  Negative for appetite change, chills and fever.  HENT:  Positive for congestion.   Respiratory:  Positive for cough. Negative for chest  tightness, shortness of breath and wheezing.   Cardiovascular:  Negative for chest pain and palpitations.  Gastrointestinal:  Negative for abdominal pain, nausea and vomiting.    {Labs  Heme  Chem  Endocrine  Serology  Results Review (optional):23779}   Objective    BP (!) 109/46 (BP Location: Right Arm, Patient Position: Sitting, Cuff Size: Large)   Pulse 87   Resp 16   Wt 213 lb (96.6 kg)   SpO2 96%   BMI 28.89 kg/m  {Show previous vital signs (optional):23777}  Physical Exam  ***  No results found for any visits on 11/14/21.  Assessment & Plan     ***  No follow-ups on file.      {provider attestation***:1}   Megan Mans, MD  [facility] [phone] (phone) [phone] (fax)  [facility]

## 2021-11-19 ENCOUNTER — Ambulatory Visit: Payer: Medicare Other | Admitting: Student in an Organized Health Care Education/Training Program

## 2021-11-19 ENCOUNTER — Encounter: Payer: Self-pay | Admitting: Student in an Organized Health Care Education/Training Program

## 2021-11-19 VITALS — BP 132/78 | HR 74 | Temp 98.0°F | Ht 72.0 in | Wt 213.0 lb

## 2021-11-19 DIAGNOSIS — J4550 Severe persistent asthma, uncomplicated: Secondary | ICD-10-CM | POA: Diagnosis not present

## 2021-11-19 MED ORDER — FLUTICASONE PROPIONATE 50 MCG/ACT NA SUSP: 1.0000 | Freq: Every day | NASAL | 11 refills | Status: DC

## 2021-11-19 MED ORDER — FLUTICASONE-SALMETEROL 250-50 MCG/ACT IN AEPB: 1.0000 | INHALATION_SPRAY | Freq: Two times a day (BID) | RESPIRATORY_TRACT | 11 refills | Status: DC

## 2021-11-19 NOTE — Progress Notes (Signed)
Synopsis: Referred in for shortness of breath by [name] Sprout, FNP  Assessment & Plan:   1. Severe persistent asthma, unspecified whether complicated  [name] [name] is presenting for the evaluation of shortness of breath, cough and wheeze that I suspect is due to poorly controlled asthma. He has had two courses of prednisone recently and many more in the months prior. I am concerned that his Symbicort is not providing sufficient inhaled steroid exposure and will step up his therapy. I will start fluticasone-salmeterol 250-50 two puffs twice daily, and have asked him to continue using his flonase. Should his symptoms quickly improve, we will consider stepping down his therapy quickly.  As for workup, my differential includes, in addition to asthma, allergic bronchopulmonary aspergillosis/mycosis and bronchiectasis. I will obtain a CT scan of the chest without contrast, a CBC with differential, an allergen panel, IgE levels, and aspergillus specific antigens. He has never had eosinophilia and previous notes mention negative allergen testing so I suspect his asthma would lean towards a Th1 response. Should ICS therapy fail, biologics (Tezepelumab) could be an option for [name] [name].  Finally, I encouraged him to follow up re his reflux as this is likely also contributing to his poorly controlled asthma.  - CT CHEST WO CONTRAST; Future - Allergen Panel (27) + IGE - Aspergillus IgE Panel - Aspergillus fumigatus IgG - CBC w/Diff - fluticasone-salmeterol (ADVAIR DISKUS) 250-50 MCG/ACT AEPB; Inhale 1 puff into the lungs in the morning and at bedtime.  Dispense: 60 each; Refill: 11 - fluticasone (FLONASE) 50 MCG/ACT nasal spray; Place 1 spray into both nostrils daily.  Dispense: 18.2 mL; Refill: 11   Return in about 6 weeks (around 12/31/2021).  I spent 60 minutes caring for this patient today, including preparing to see the patient, obtaining and/or reviewing separately obtained history,  performing a medically appropriate examination and/or evaluation, counseling and educating the patient/family/caregiver, ordering medications, tests, or procedures, and documenting clinical information in the electronic health record  [name] Reichert, MD [name] [name] 11/19/2021 6:23 PM    End of visit medications:  Meds ordered this encounter  Medications   fluticasone-salmeterol (ADVAIR DISKUS) 250-50 MCG/ACT AEPB    Sig: Inhale 1 puff into the lungs in the morning and at bedtime.    Dispense:  60 each    Refill:  11   fluticasone (FLONASE) 50 MCG/ACT nasal spray    Sig: Place 1 spray into both nostrils daily.    Dispense:  18.2 mL    Refill:  11     Current Outpatient Medications:    albuterol (VENTOLIN HFA) 108 (90 Base) MCG/ACT inhaler, Inhale 1-2 puffs into the lungs every 6 (six) hours as needed for wheezing or shortness of breath., Disp: 18 g, Rfl: 0   Calcium Carb-Cholecalciferol (CALCIUM 600+D) 600-800 MG-UNIT TABS, Take 1 tablet by mouth daily., Disp: , Rfl:    calcium carbonate (TUMS - DOSED IN MG ELEMENTAL CALCIUM) 500 MG chewable tablet, Chew 500 mg by mouth daily as needed for indigestion or heartburn., Disp: , Rfl:    DEXILANT 60 MG capsule, TAKE ONE CAPSULE BY MOUTH ONE TIME DAILY, Disp: 90 capsule, Rfl: 2   dextromethorphan-guaiFENesin (MUCINEX DM) 30-600 MG 12hr tablet, Take 1 tablet by mouth 2 (two) times daily as needed for cough., Disp: , Rfl:    dutasteride (AVODART) 0.5 MG capsule, Take 1 capsule (0.5 mg total) by mouth daily., Disp: 90 capsule, Rfl: 3   famotidine (PEPCID) 40 MG tablet, TAKE ONE  TABLET BY MOUTH DAILY (Patient taking differently: Take 40 mg by mouth at bedtime.), Disp: 30 tablet, Rfl: 11   fluticasone (FLONASE) 50 MCG/ACT nasal spray, Place 1 spray into both nostrils daily., Disp: 18.2 mL, Rfl: 11   fluticasone-salmeterol (ADVAIR DISKUS) 250-50 MCG/ACT AEPB, Inhale 1 puff into the lungs in the morning and at bedtime., Disp: 60  each, Rfl: 11   hydrochlorothiazide (HYDRODIURIL) 25 MG tablet, TAKE ONE TABLET BY MOUTH ONE TIME DAILY, Disp: 90 tablet, Rfl: 1   ketotifen (ZADITOR) 0.025 % ophthalmic solution, Place 1 drop into both eyes daily. , Disp: , Rfl:    losartan (COZAAR) 100 MG tablet, TAKE ONE TABLET BY MOUTH ONE TIME DAILY, Disp: 90 tablet, Rfl: 1   Misc Natural Products (GLUCOSAMINE CHONDROITIN TRIPLE) TABS, Take 1 tablet by mouth 2 (two) times daily., Disp: , Rfl:    Multiple Vitamin (MULTI-VITAMINS) TABS, Take 1 tablet by mouth daily. , Disp: , Rfl:    Multiple Vitamins-Minerals (PRESERVISION AREDS 2) CAPS, Take 1 capsule by mouth 2 (two) times daily., Disp: , Rfl:    tadalafil (CIALIS) 5 MG tablet, Take 1 tablet (5 mg total) by mouth at bedtime., Disp: 90 tablet, Rfl: 3   tamsulosin (FLOMAX) 0.4 MG CAPS capsule, Take 1 capsule (0.4 mg total) by mouth daily., Disp: 90 capsule, Rfl: 3   Testosterone 20.25 MG/ACT (1.62%) GEL, 1 pump applied to each shoulder daily, Disp: 75 g, Rfl: 2   Turmeric 500 MG CAPS, Take 500 mg by mouth 2 (two) times daily. , Disp: , Rfl:    verapamil (CALAN-SR) 240 MG CR tablet, TAKE ONE TABLET BY MOUTH TWICE A DAY, Disp: 180 tablet, Rfl: 1   Subjective:   PATIENT ID: [name] [name] GENDER: male DOB: September 03, 1955, MRN: [id number]  Chief Complaint  Patient presents with   pulmonary consult    Hx of asthma--completed course of prednisone and abx today for bronchitis. C/o prod cough with white sputum and wheezing.     HPI  [name] [name] is a pleasant 66 year old male presenting to clinic for the evaluation of shortness of breath and cough.  He is coming in with symptoms of cough productive of whitish (sometimes yellow) sputum, associated with shortness of breath, exertional dyspnea, and wheezing. His symptoms have been present for months. He tells me he was diagnosed with asthma many years ago by Dr. Franklin [name]. He thinks there might be a temporal association with the installation of  new floors in his bedroom, but is not certain. He has had multiple exacerbations of his asthma, requiring antibiotics and steroids. He never seems to recover fully. His exacerbations come about without associated URTI symptoms. He has not had any sick contacts. He does not have any pets and is a non-smoker (thou reports previous second hand exposure). He does not have any chest pain or tightness, nor does he have any fevers, chills, night sweats, or weight loss. He does have a runny nose, a problem that is chronic for him. He occasionally uses Flonase but has not used allergy pills as they are too sedating (including Zyrtec). He has always had heartburn, with his cough made worse at night.  He tells me he has a history of asthma as well as cough, and had previously been seen at a [facility]. Work up at [facility] was initiated by Renee Harder, MD. At that time, he was referred for low immunoglobulin levels. A high resolution CT chest in 2020 at [facility] suggested possible early bronchiectasis. He  was referred to ENT and GI for further workup of sinusitis and reflux disease. Given concern for silent aspiration triggering his asthma, further workup was pursued. He was eventually referred to thoracic surgery after esophageal manometry showed a medium to large sized hiatal hernia. Barium swallow in January of 2023 didn't show a hiatal hernia but did show severe GERD. He was offered robot assisted laparoscopic repair of his paraesophageal hernia and fundoplication which he would like to proceed with in the fall.  He is a never smoker, but reports second hand exposure. He used to work at [employer] and retired recently. He continues to be active in his church. He reports significant mold exposure at [facility], and suspects there might be mold exposure at church. He does not have any pets.  Ancillary information including prior medications, full medical/surgical/family/social histories, and PFTs (when available) are listed below  and have been reviewed.   Review of Systems  Constitutional:  Negative for chills and fever.  Respiratory:  Positive for cough, sputum production, shortness of breath and wheezing.   Cardiovascular:  Negative for chest pain.  Gastrointestinal:  Positive for heartburn.  Skin:  Negative for rash.     Objective:   Vitals:   11/19/21 1454  BP: 132/78  Pulse: 74  Temp: 98 F (36.7 C)  TempSrc: Temporal  SpO2: 96%  Weight: 213 lb (96.6 kg)  Height: 6' (1.829 m)   96% on RA BMI Readings from Last 3 Encounters:  11/19/21 28.89 kg/m  11/14/21 28.89 kg/m  09/26/21 29.84 kg/m   Wt Readings from Last 3 Encounters:  11/19/21 213 lb (96.6 kg)  11/14/21 213 lb (96.6 kg)  09/26/21 220 lb (99.8 kg)    Physical Exam Constitutional:      Appearance: Normal appearance. He is normal weight.  HENT:     Head: Normocephalic.     Nose: Congestion present.     Mouth/Throat:     Mouth: Mucous membranes are moist.  Cardiovascular:     Rate and Rhythm: Normal rate and regular rhythm.     Pulses: Normal pulses.     Heart sounds: Normal heart sounds.  Pulmonary:     Effort: Pulmonary effort is normal.     Breath sounds: Wheezing (diffuse scattered wheezes) and rhonchi (scattered) present.  Musculoskeletal:        General: Normal range of motion.     Cervical back: Normal range of motion and neck supple.  Skin:    General: Skin is warm.  Neurological:     General: No focal deficit present.     Mental Status: He is alert and oriented to person, place, and time.       Ancillary Information    Past Medical History:  Diagnosis Date   Allergy    Asthma    BP (high blood pressure) 11/07/2014   Should stop combination of Tekturna (DRI)  and ARB    Hypertension    HYPERTENSION, SEVERE 03/15/2007   Qualifier: Diagnosis of  By: Vernie Murders     Prostate enlargement    Sleep apnea      Family History  Problem Relation Age of Onset   Cancer Mother        ovarian   Glaucoma  Mother    Cancer Father        prostate   Hypertension Sister    Asthma Sister    Alcohol abuse Paternal Uncle    Liver disease Paternal Uncle    Cancer -  Prostate Paternal Uncle    Cancer Maternal Uncle        lung; two mat uncles   Cancer Paternal Grandmother        breast   Cancer Paternal Aunt        lung cancer in a smoker     Past Surgical History:  Procedure Laterality Date   APPENDECTOMY  1990   Dr Bary Castilla   COLONOSCOPY  2008   Dr Bary Castilla   COLONOSCOPY WITH PROPOFOL N/A 08/13/2016   Procedure: COLONOSCOPY WITH PROPOFOL;  Surgeon: Robert Bellow, MD;  Location: [facility] ENDOSCOPY;  Service: Endoscopy;  Laterality: N/A;   HEMORRHOID SURGERY     HERNIA REPAIR  [id number]   umbilical hernia   KNEE ARTHROSCOPY Left    KNEE SURGERY Left 1986   NASAL SEPTUM SURGERY     TONSILLECTOMY     TOTAL KNEE ARTHROPLASTY Left 03/29/2019   Procedure: TOTAL KNEE ARTHROPLASTY;  Surgeon: Corky Mull, MD;  Location: [facility] ORS;  Service: Orthopedics;  Laterality: Left;   VASECTOMY      Social History   Socioeconomic History   Marital status: Married    Spouse name: Not on file   Number of children: Not on file   Years of education: Not on file   Highest education level: Not on file  Occupational History   Not on file  Tobacco Use   Smoking status: Never   Smokeless tobacco: Never   Tobacco comments:    Exposed to second hand smoke as a child  Substance and Sexual Activity   Alcohol use: Yes    Alcohol/week: 0.0 standard drinks of alcohol    Comment: occasionally   Drug use: No   Sexual activity: Not on file  Other Topics Concern   Not on file  Social History Narrative   Not on file   Social Determinants of Health   Financial Resource Strain: Not on file  Food Insecurity: Not on file  Transportation Needs: No Transportation Needs (11/08/2021)   PRAPARE - Hydrologist (Medical): No    Lack of Transportation (Non-Medical): No  Physical  Activity: Not on file  Stress: Not on file  Social Connections: Not on file  Intimate Partner Violence: Not on file     Allergies  Allergen Reactions   Hydralazine Hives and Shortness Of Breath    Chest tightness, severe headache, dyspnea Other reaction(s): Chest Pain, Headache, Respiratory Distress   Gluten Meal Diarrhea    bloating   Moxifloxacin Hives    Avelox   Pseudoephedrine Other (See Comments)    Confusion, passed out, numbness   Sulfa Antibiotics Hives   Sulfonamide Derivatives Hives     CBC    Component Value Date/Time   WBC 5.6 11/30/2020 0825   WBC 7.3 03/29/2019 0639   RBC 4.35 11/30/2020 0825   RBC 4.52 03/29/2019 0639   HGB 13.7 11/30/2020 0825   HCT 42.2 09/17/2021 0916   PLT 174 11/30/2020 0825   MCV 93 11/30/2020 0825   MCH 31.5 11/30/2020 0825   MCH 32.5 03/29/2019 0639   MCHC 34.0 11/30/2020 0825   MCHC 34.6 03/29/2019 0639   RDW 12.2 11/30/2020 0825   LYMPHSABS 1.4 11/30/2020 0825   MONOABS 0.6 03/29/2019 0639   EOSABS 0.1 11/30/2020 0825   BASOSABS 0.0 11/30/2020 0825    Pulmonary Functions Testing Results:     No data to display          Outpatient  Medications Prior to Visit  Medication Sig Dispense Refill   albuterol (VENTOLIN HFA) 108 (90 Base) MCG/ACT inhaler Inhale 1-2 puffs into the lungs every 6 (six) hours as needed for wheezing or shortness of breath. 18 g 0   Calcium Carb-Cholecalciferol (CALCIUM 600+D) 600-800 MG-UNIT TABS Take 1 tablet by mouth daily.     calcium carbonate (TUMS - DOSED IN MG ELEMENTAL CALCIUM) 500 MG chewable tablet Chew 500 mg by mouth daily as needed for indigestion or heartburn.     DEXILANT 60 MG capsule TAKE ONE CAPSULE BY MOUTH ONE TIME DAILY 90 capsule 2   dextromethorphan-guaiFENesin (MUCINEX DM) 30-600 MG 12hr tablet Take 1 tablet by mouth 2 (two) times daily as needed for cough.     dutasteride (AVODART) 0.5 MG capsule Take 1 capsule (0.5 mg total) by mouth daily. 90 capsule 3   famotidine  (PEPCID) 40 MG tablet TAKE ONE TABLET BY MOUTH DAILY (Patient taking differently: Take 40 mg by mouth at bedtime.) 30 tablet 11   hydrochlorothiazide (HYDRODIURIL) 25 MG tablet TAKE ONE TABLET BY MOUTH ONE TIME DAILY 90 tablet 1   ketotifen (ZADITOR) 0.025 % ophthalmic solution Place 1 drop into both eyes daily.      losartan (COZAAR) 100 MG tablet TAKE ONE TABLET BY MOUTH ONE TIME DAILY 90 tablet 1   Misc Natural Products (GLUCOSAMINE CHONDROITIN TRIPLE) TABS Take 1 tablet by mouth 2 (two) times daily.     Multiple Vitamin (MULTI-VITAMINS) TABS Take 1 tablet by mouth daily.      Multiple Vitamins-Minerals (PRESERVISION AREDS 2) CAPS Take 1 capsule by mouth 2 (two) times daily.     tadalafil (CIALIS) 5 MG tablet Take 1 tablet (5 mg total) by mouth at bedtime. 90 tablet 3   tamsulosin (FLOMAX) 0.4 MG CAPS capsule Take 1 capsule (0.4 mg total) by mouth daily. 90 capsule 3   Testosterone 20.25 MG/ACT (1.62%) GEL 1 pump applied to each shoulder daily 75 g 2   Turmeric 500 MG CAPS Take 500 mg by mouth 2 (two) times daily.      verapamil (CALAN-SR) 240 MG CR tablet TAKE ONE TABLET BY MOUTH TWICE A DAY 180 tablet 1   budesonide-formoterol (SYMBICORT) 160-4.5 MCG/ACT inhaler Inhale 2 puffs into the lungs 2 (two) times daily.     doxycycline (VIBRA-TABS) 100 MG tablet Take 1 tablet (100 mg total) by mouth 2 (two) times daily. 10 tablet 1   predniSONE (STERAPRED UNI-PAK 21 TAB) 10 MG (21) TBPK tablet Taper 6-5-4-3-2-1 21 tablet 1   No facility-administered medications prior to visit.

## 2021-11-19 NOTE — Patient Instructions (Addendum)
I have ordered you Advair to be used twice daily (two puffs) in place of the symbicort. I ALSO ordered blood work. You can get them draw at your preferred [facility] draw station. The nearest one to [facility] is at nearby [facility] ([address] [place], [place], [place] [address]). Finally, I ordered a CT scan of the chest. I will see you for follow up in 6 weeks.

## 2021-11-20 ENCOUNTER — Telehealth: Payer: Self-pay | Admitting: Student in an Organized Health Care Education/Training Program

## 2021-11-20 DIAGNOSIS — J4550 Severe persistent asthma, uncomplicated: Secondary | ICD-10-CM | POA: Diagnosis not present

## 2021-11-20 MED ORDER — FLUTICASONE-SALMETEROL 250-50 MCG/ACT IN AEPB: 2.0000 | INHALATION_SPRAY | Freq: Two times a day (BID) | RESPIRATORY_TRACT | 11 refills | Status: DC

## 2021-11-20 NOTE — Telephone Encounter (Signed)
Patient is aware of below message and voiced his understanding.  Nothing further needed.   

## 2021-11-20 NOTE — Telephone Encounter (Signed)
Spoke to patient. He stated that he received advair Rx and instructions are 1 puff BID. He was under the impression that he should double up and take 2 puffs BID. One inhaler will not last a full month if he doubles up.   Dr. Aundria Rud, please advise. Thanks

## 2021-11-21 ENCOUNTER — Ambulatory Visit
Admission: RE | Admit: 2021-11-21 | Discharge: 2021-11-21 | Disposition: A | Discharge status: Home / Self Care | Payer: Medicare Other | Source: Ambulatory Visit | Attending: Student in an Organized Health Care Education/Training Program | Admitting: Student in an Organized Health Care Education/Training Program

## 2021-11-21 DIAGNOSIS — R918 Other nonspecific abnormal finding of lung field: Secondary | ICD-10-CM | POA: Diagnosis not present

## 2021-11-21 DIAGNOSIS — R911 Solitary pulmonary nodule: Secondary | ICD-10-CM | POA: Diagnosis not present

## 2021-11-21 DIAGNOSIS — R053 Chronic cough: Secondary | ICD-10-CM | POA: Diagnosis not present

## 2021-11-21 DIAGNOSIS — J4550 Severe persistent asthma, uncomplicated: Secondary | ICD-10-CM | POA: Insufficient documentation

## 2021-11-26 LAB — CBC WITH DIFFERENTIAL/PLATELET
Basophils Absolute: 0.1 x10E3/uL (ref 0.0–0.2)
Basos: 1 %
EOS (ABSOLUTE): 0.1 x10E3/uL (ref 0.0–0.4)
Eos: 1 %
Hematocrit: 41.1 % (ref 37.5–51.0)
Hemoglobin: 14.4 g/dL (ref 13.0–17.7)
Immature Grans (Abs): 0.2 x10E3/uL — ABNORMAL HIGH (ref 0.0–0.1)
Immature Granulocytes: 2 %
Lymphocytes Absolute: 2.2 x10E3/uL (ref 0.7–3.1)
Lymphs: 23 %
MCH: 32.7 pg (ref 26.6–33.0)
MCHC: 35 g/dL (ref 31.5–35.7)
MCV: 93 fL (ref 79–97)
Monocytes Absolute: 0.7 x10E3/uL (ref 0.1–0.9)
Monocytes: 7 %
Neutrophils Absolute: 6.4 x10E3/uL (ref 1.4–7.0)
Neutrophils: 66 %
Platelets: 243 x10E3/uL (ref 150–450)
RBC: 4.4 x10E6/uL (ref 4.14–5.80)
RDW: 12.5 % (ref 11.6–15.4)
WBC: 9.6 x10E3/uL (ref 3.4–10.8)

## 2021-11-26 LAB — ALLERGEN PANEL (27) + IGE
Alternaria Alternata IgE: <0.1 kU/L
Aspergillus Fumigatus IgE: <0.1 kU/L
Bahia Grass IgE: <0.1 kU/L
Bermuda Grass IgE: <0.1 kU/L
Cat Dander IgE: <0.1 kU/L
Cedar, Mountain IgE: <0.1 kU/L
Cladosporium Herbarum IgE: <0.1 kU/L
Cocklebur IgE: <0.1 kU/L
Cockroach, American IgE: <0.1 kU/L
Common Silver Birch IgE: <0.1 kU/L
D Farinae IgE: <0.1 kU/L
D Pteronyssinus IgE: <0.1 kU/L
Dog Dander IgE: <0.1 kU/L
Elm, American IgE: <0.1 kU/L
Hickory, White IgE: <0.1 kU/L
IgE (Immunoglobulin E), Serum: 45 IU/mL (ref 6–495)
Johnson Grass IgE: <0.1 kU/L
Kentucky Bluegrass IgE: <0.1 kU/L
Maple/Box Elder IgE: <0.1 kU/L
Mucor Racemosus IgE: <0.1 kU/L
Oak, White IgE: <0.1 kU/L
Penicillium Chrysogen IgE: <0.1 kU/L
Pigweed, Rough IgE: <0.1 kU/L
Plantain, English IgE: <0.1 kU/L
Ragweed, Short IgE: <0.1 kU/L
Setomelanomma Rostrat: <0.1 kU/L
Timothy Grass IgE: <0.1 kU/L
White Mulberry IgE: <0.1 kU/L

## 2021-11-26 LAB — ASPERGILLUS IGE PANEL
A. Amstel/Glaucu Class Interp: 0
A. Flavus Class Interp: 0
A. Fumigatus Class Interp: 0
A. Nidulans Class Interp: 0
A. Niger Class Interp: 0
A. Versicolor Class Interp: 0
Aspergillus amstel/glaucu IgE*: <0.35 kU/L
Aspergillus flavus IgE: <0.35 kU/L
Aspergillus fumigatus IgE: <0.1 kU/L
Aspergillus nidulans IgE: <0.35 kU/L
Aspergillus niger IgE: <0.1 kU/L
Aspergillus versicolor IgE: <0.1 kU/L

## 2021-11-26 LAB — ASPERGILLUS FUMIGATUS IGG: Aspergillus fumigatus IgG: 21.9 mg/L (ref 0.0–50.6)

## 2021-11-27 ENCOUNTER — Ambulatory Visit: Payer: BC Managed Care – PPO | Admitting: Family Medicine

## 2021-12-05 ENCOUNTER — Telehealth: Payer: Self-pay | Admitting: Student in an Organized Health Care Education/Training Program

## 2021-12-05 DIAGNOSIS — J4550 Severe persistent asthma, uncomplicated: Secondary | ICD-10-CM

## 2021-12-06 MED ORDER — FLUTICASONE-SALMETEROL 500-50 MCG/ACT IN AEPB: 1.0000 | INHALATION_SPRAY | Freq: Two times a day (BID) | RESPIRATORY_TRACT | 11 refills | Status: DC

## 2021-12-06 NOTE — Telephone Encounter (Signed)
Patient is aware of below message and voiced his understanding.  Nothing further needed.   

## 2021-12-06 NOTE — Telephone Encounter (Signed)
Dr. Genia Harold, please advise if advair can be changed to 54mcg or if you like PA started for 281mcg? Thanks

## 2021-12-11 ENCOUNTER — Encounter: Payer: Medicare Other | Admitting: Family Medicine

## 2021-12-19 ENCOUNTER — Encounter: Payer: Self-pay | Admitting: Family Medicine

## 2021-12-19 ENCOUNTER — Ambulatory Visit (INDEPENDENT_AMBULATORY_CARE_PROVIDER_SITE_OTHER): Payer: Medicare Other | Admitting: Family Medicine

## 2021-12-19 VITALS — BP 123/74 | HR 91 | Temp 98.0°F | Resp 16 | Ht 72.0 in | Wt 214.5 lb

## 2021-12-19 DIAGNOSIS — Z0000 Encounter for general adult medical examination without abnormal findings: Secondary | ICD-10-CM | POA: Diagnosis not present

## 2021-12-19 DIAGNOSIS — J4550 Severe persistent asthma, uncomplicated: Secondary | ICD-10-CM

## 2021-12-19 DIAGNOSIS — I10 Essential (primary) hypertension: Secondary | ICD-10-CM | POA: Diagnosis not present

## 2021-12-19 DIAGNOSIS — K219 Gastro-esophageal reflux disease without esophagitis: Secondary | ICD-10-CM

## 2021-12-19 DIAGNOSIS — N401 Enlarged prostate with lower urinary tract symptoms: Secondary | ICD-10-CM

## 2021-12-19 DIAGNOSIS — R350 Frequency of micturition: Secondary | ICD-10-CM

## 2021-12-19 DIAGNOSIS — R053 Chronic cough: Secondary | ICD-10-CM

## 2021-12-19 NOTE — Assessment & Plan Note (Signed)
Chronic, controlled He will continue hydrochlorothiazide 25 mg daily and losartan 100 mg daily and verapamil 240 mg twice daily Blood pressure within goal range today

## 2021-12-19 NOTE — Assessment & Plan Note (Signed)
Chronic, stable  He follows with Urology  He will continue flomax 0.4mg  and avodart 0.5mg  daily

## 2021-12-19 NOTE — Assessment & Plan Note (Addendum)
Reviewed patient's medications, medical history, surgical history and social history EKG was completed today  Indication/Reason for EKG: Welcome to Medicare Comparison to prior EKG: No changes Findings: Sinus rhythm, no signs of ischemia, normal intervals Conclusion: Normal EKG Date: 12/19/21 Recommended shingles vaccination Patient has completed recommendations for COVID-vaccine, influenza vaccine, he will return in 2 weeks for pneumonia vaccination  He is up to date with colon cancer screening   Reviewed patient's fall risk Negative screening for depression Patient completing ADLs without difficulty Discussed recommendations for regular physical activity and well-balanced diet

## 2021-12-19 NOTE — Assessment & Plan Note (Addendum)
Chronic, stable Patient has appointment with general surgery to schedule surgery to address this problem We will continue Dexilant 60 mg daily and famotidine 40 mg daily

## 2021-12-19 NOTE — Progress Notes (Addendum)
I,[name],acting as a scribe for [employer], MD.,have documented all relevant documentation on the behalf of [name], MD,as directed by  [name], MD while in the presence of [name], MD.   Medicare Initial Preventative Physical Exam    Patient: [name] [name], Male    DOB: [birth year], 66 y.o.   MRN: [id number] Visit Date: 12/19/2021  Today's Provider: [name], MD   Chief Complaint  Patient presents with   Medicare Wellness   Subjective    Medicare Initial Preventative Physical Exam [name] [name] is a 66 y.o. male who presents today for his Initial Preventative Physical Exam.   HPI  Social History   Socioeconomic History   Marital status: Married    Spouse name: Not on file   Number of children: Not on file   Years of education: Not on file   Highest education level: Not on file  Occupational History   Not on file  Tobacco Use   Smoking status: Never   Smokeless tobacco: Never   Tobacco comments:    Exposed to second hand smoke as a child  Substance and Sexual Activity   Alcohol use: Yes    Alcohol/week: 0.0 standard drinks of alcohol    Comment: occasionally   Drug use: No   Sexual activity: Not on file  Other Topics Concern   Not on file  Social History Narrative   Not on file   Social Determinants of Health   Financial Resource Strain: Not on file  Food Insecurity: Not on file  Transportation Needs: No Transportation Needs (11/08/2021)   PRAPARE - Administrator, Civil Service (Medical): No    Lack of Transportation (Non-Medical): No  Physical Activity: Not on file  Stress: Not on file  Social Connections: Not on file  Intimate Partner Violence: Not on file    Past Medical History:  Diagnosis Date   Allergy    Asthma    BP (high blood pressure) 11/07/2014   Should stop combination of Tekturna (DRI)  and ARB    Hypertension     HYPERTENSION, SEVERE 03/15/2007   Qualifier: Diagnosis of  By: Vernie Murders     Prostate enlargement    Sleep apnea      Patient Active Problem List   Diagnosis Date Noted   Welcome to Medicare preventive visit 12/19/2021   Essential hypertension 12/19/2021   Severe persistent asthma 09/26/2021   Erectile dysfunction due to arterial insufficiency 07/15/2018   Synovitis of elbow 08/17/2017   Thrombosed external hemorrhoid 05/10/2015   Allergic rhinitis 11/07/2014   Arthralgia of hand 11/07/2014   Benign prostatic hyperplasia with lower urinary tract symptoms 11/07/2014   Impotence of organic origin 11/07/2014   Acid reflux 11/07/2014   Hypogonadism in male 11/07/2014   Obstructive apnea 11/07/2014   Plantar fasciitis 11/07/2014   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   Testicular hypofunction 10/26/2013   Pain in soft tissues of limb 10/26/2013   Palpitations 10/26/2013   Personal history of perinatal problems 10/26/2013   ACID REFLUX DISEASE 03/15/2007   COUGH, CHRONIC 03/15/2007    Past Surgical History:  Procedure Laterality Date   APPENDECTOMY  1990   Dr Lemar Livings   COLONOSCOPY  2008   Dr Lemar Livings   COLONOSCOPY WITH PROPOFOL N/A 08/13/2016   Procedure: COLONOSCOPY WITH PROPOFOL;  Surgeon: Earline Mayotte, MD;  Location: [facility] ENDOSCOPY;  Service: Endoscopy;  Laterality: N/A;   HEMORRHOID SURGERY  HERNIA REPAIR  01/20/14   umbilical hernia   KNEE ARTHROSCOPY Left    KNEE SURGERY Left 1986   NASAL SEPTUM SURGERY     TONSILLECTOMY     TOTAL KNEE ARTHROPLASTY Left 03/29/2019   Procedure: TOTAL KNEE ARTHROPLASTY;  Surgeon: Christena Flake, MD;  Location: [facility] ORS;  Service: Orthopedics;  Laterality: Left;   VASECTOMY      His family history includes Alcohol abuse in his paternal uncle; Asthma in his sister; Cancer in his father, maternal uncle, mother, paternal aunt, and paternal grandmother; Cancer - Prostate in his paternal uncle; Glaucoma in his  mother; Hypertension in his sister; Liver disease in his paternal uncle.   Current Outpatient Medications:    albuterol (VENTOLIN HFA) 108 (90 Base) MCG/ACT inhaler, Inhale 1-2 puffs into the lungs every 6 (six) hours as needed for wheezing or shortness of breath., Disp: 18 g, Rfl: 0   Calcium Carb-Cholecalciferol (CALCIUM 600+D) 600-800 MG-UNIT TABS, Take 1 tablet by mouth daily., Disp: , Rfl:    DEXILANT 60 MG capsule, TAKE ONE CAPSULE BY MOUTH ONE TIME DAILY, Disp: 90 capsule, Rfl: 2   dextromethorphan-guaiFENesin (MUCINEX DM) 30-600 MG 12hr tablet, Take 1 tablet by mouth 2 (two) times daily as needed for cough., Disp: , Rfl:    dutasteride (AVODART) 0.5 MG capsule, Take 1 capsule (0.5 mg total) by mouth daily., Disp: 90 capsule, Rfl: 3   famotidine (PEPCID) 40 MG tablet, TAKE ONE TABLET BY MOUTH DAILY (Patient taking differently: Take 40 mg by mouth at bedtime.), Disp: 30 tablet, Rfl: 11   fluticasone (FLONASE) 50 MCG/ACT nasal spray, Place 1 spray into both nostrils daily., Disp: 18.2 mL, Rfl: 11   fluticasone-salmeterol (ADVAIR DISKUS) 500-50 MCG/ACT AEPB, Inhale 1 puff into the lungs in the morning and at bedtime., Disp: 1 each, Rfl: 11   hydrochlorothiazide (HYDRODIURIL) 25 MG tablet, TAKE ONE TABLET BY MOUTH ONE TIME DAILY, Disp: 90 tablet, Rfl: 1   ketotifen (ZADITOR) 0.025 % ophthalmic solution, Place 1 drop into both eyes daily. , Disp: , Rfl:    Misc Natural Products (GLUCOSAMINE CHONDROITIN TRIPLE) TABS, Take 1 tablet by mouth 2 (two) times daily., Disp: , Rfl:    Multiple Vitamin (MULTI-VITAMINS) TABS, Take 1 tablet by mouth daily. , Disp: , Rfl:    Multiple Vitamins-Minerals (PRESERVISION AREDS 2) CAPS, Take 1 capsule by mouth 2 (two) times daily., Disp: , Rfl:    tadalafil (CIALIS) 5 MG tablet, Take 1 tablet (5 mg total) by mouth at bedtime., Disp: 90 tablet, Rfl: 3   tamsulosin (FLOMAX) 0.4 MG CAPS capsule, Take 1 capsule (0.4 mg total) by mouth daily., Disp: 90 capsule, Rfl: 3    Testosterone 20.25 MG/ACT (1.62%) GEL, 1 pump applied to each shoulder daily, Disp: 75 g, Rfl: 2   Turmeric 500 MG CAPS, Take 500 mg by mouth 2 (two) times daily. , Disp: , Rfl:    verapamil (CALAN-SR) 240 MG CR tablet, TAKE ONE TABLET BY MOUTH TWICE A DAY, Disp: 180 tablet, Rfl: 1   losartan (COZAAR) 100 MG tablet, TAKE ONE TABLET BY MOUTH ONE TIME DAILY, Disp: 90 tablet, Rfl: 1   Patient Care Team: [name], MD as PCP - General (Family Medicine) Maple Hudson., MD (Family Medicine) Lemar Livings, Merrily Pew, MD (General Surgery)  Review of Systems  Respiratory:  Positive for apnea, cough and wheezing.   All other systems reviewed and are negative.     Objective    Vitals: BP 123/74 (BP  Location: Right Arm, Patient Position: Sitting, Cuff Size: Large)   Pulse 91   Temp 98 F (36.7 C) (Oral)   Resp 16   Ht 6' (1.829 m)   Wt 214 lb 8 oz (97.3 kg)   BMI 29.09 kg/m  Vision Screening - Comments:: Just had an eye exam done  09/06/2021 Dr. Faylene Kurtz Physical Exam Vitals reviewed.  Constitutional:      General: He is not in acute distress.    Appearance: Normal appearance. He is not ill-appearing, toxic-appearing or diaphoretic.  HENT:     Head: Normocephalic and atraumatic.     Right Ear: Tympanic membrane and external ear normal.     Left Ear: Tympanic membrane and external ear normal.     Nose: Nose normal.     Mouth/Throat:     Mouth: Mucous membranes are moist.     Pharynx: No oropharyngeal exudate or posterior oropharyngeal erythema.  Eyes:     General: No scleral icterus.    Extraocular Movements: Extraocular movements intact.     Conjunctiva/sclera: Conjunctivae normal.     Pupils: Pupils are equal, round, and reactive to light.  Cardiovascular:     Rate and Rhythm: Normal rate and regular rhythm.     Pulses: Normal pulses.     Heart sounds: Normal heart sounds. No murmur heard. Pulmonary:     Effort: Pulmonary effort is normal. No respiratory  distress.     Breath sounds: No stridor. No wheezing or rales.     Comments: Coarse breath sounds throughout right lung field greater than left Abdominal:     General: Bowel sounds are normal. There is no distension.     Palpations: Abdomen is soft.     Tenderness: There is no abdominal tenderness.  Musculoskeletal:        General: No swelling, tenderness or signs of injury. Normal range of motion.     Cervical back: Normal range of motion and neck supple.     Comments: Trace BLE edema   Skin:    General: Skin is warm and dry.     Capillary Refill: Capillary refill takes less than 2 seconds.     Findings: No erythema or rash.  Neurological:     Mental Status: He is alert and oriented to person, place, and time.     Motor: No weakness.     Gait: Gait normal.  Psychiatric:        Mood and Affect: Mood normal.        Behavior: Behavior normal.     Activities of Daily Living    12/19/2021    8:21 AM 09/26/2021    3:49 PM  In your present state of health, do you have any difficulty performing the following activities:  Hearing? 0 0  Vision? 0 0  Difficulty concentrating or making decisions? 0 0  Walking or climbing stairs? 0 0  Dressing or bathing? 0 0  Doing errands, shopping? 0 0    Fall Risk Assessment    12/19/2021    8:20 AM 09/26/2021    3:48 PM 05/27/2021    8:51 AM 11/26/2020   10:25 AM 01/02/2020    9:08 AM  Fall Risk   Falls in the past year? 0 0 0 0 0  Number falls in past yr: 0 0  0 0  Injury with Fall? 0 0  0 0  Risk for fall due to : No Fall Risks No Fall Risks  No Fall Risks No Fall  Risks  Follow up  Falls evaluation completed  Falls evaluation completed Falls evaluation completed     Depression Screen    12/19/2021    8:21 AM 09/26/2021    3:48 PM 05/27/2021    8:51 AM 11/26/2020   10:26 AM  PHQ 2/9 Scores  PHQ - 2 Score 0 0 0 0  PHQ- 9 Score  0 0 0       12/19/2021    8:28 AM  6CIT Screen  What Year? 0 points  What month? 0 points  What  time? 0 points  Count back from 20 0 points  Months in reverse 0 points  Repeat phrase 2 points  Total Score 2 points    No results found for any visits on 12/19/21.  Assessment & Plan      Initial Preventative Physical Exam  Reviewed patient's Family Medical History Reviewed and updated list of patient's medical providers Assessment of cognitive impairment was done Assessed patient's functional ability Established a written schedule for health screening services Health Risk Assessent Completed and Reviewed  Exercise Activities and Dietary recommendations  Goals   None     Immunization History  Administered Date(s) Administered   H1N1 01/18/2008   Influenza Split 01/12/2010   Influenza,inj,Quad PF,6+ Mos 11/28/2014, 11/11/2018   Influenza-Unspecified 12/01/2016, 12/01/2017, 11/23/2019, 11/22/2020   Moderna SARS-COV2 Booster Vaccination 09/25/2020   Moderna Sars-Covid-2 Vaccination 02/15/2021   PFIZER(Purple Top)SARS-COV-2 Vaccination 05/17/2019, 06/07/2019, 12/08/2019   Pneumococcal Polysaccharide-23 09/04/1998   Td 11/15/2003   Tdap 10/11/2013    Health Maintenance  Topic Date Due   Zoster Vaccines- Shingrix (1 of 2) Never done   COVID-19 Vaccine (5 - Pfizer series) 04/12/2021   INFLUENZA VACCINE  06/08/2022 (Originally 10/08/2021)   Pneumonia Vaccine 465+ Years old (2 - PCV) 11/20/2022 (Originally 11/11/2020)   Medicare Annual Wellness (AWV)  01/19/2023   TETANUS/TDAP  10/12/2023   COLONOSCOPY (Pts 45-4760yrs Insurance coverage will need to be confirmed)  08/14/2026   Hepatitis C Screening  Completed   HPV VACCINES  Aged Out     Discussed health benefits of physical activity, and encouraged him to engage in regular exercise appropriate for his age and condition.   Problem List Items Addressed This Visit       Cardiovascular and Mediastinum   Essential hypertension    Chronic, controlled He will continue hydrochlorothiazide 25 mg daily and losartan 100 mg daily  and verapamil 240 mg twice daily Blood pressure within goal range today        Respiratory   Severe persistent asthma    Chronic, stable  Follows with Dr. Raechel ChuteKhabib Dgayli He will continue albuterol PRN, advair 50-6450mcg twice daily            Digestive   Acid reflux    Chronic, stable Patient has appointment with general surgery to schedule surgery to address this problem We will continue Dexilant 60 mg daily and famotidine 40 mg daily        Genitourinary   Benign prostatic hyperplasia with lower urinary tract symptoms    Chronic, stable  He follows with Urology  He will continue flomax 0.4mg  and avodart 0.5mg  daily          Other   COUGH, CHRONIC   Welcome to Medicare preventive visit - Primary    Reviewed patient's medications, medical history, surgical history and social history EKG was completed today  Indication/Reason for EKG: Welcome to Medicare Comparison to prior EKG: No changes Findings: Sinus rhythm,  no signs of ischemia, normal intervals Conclusion: Normal EKG Date: 12/19/21 Recommended shingles vaccination Patient has completed recommendations for COVID-vaccine, influenza vaccine, he will return in 2 weeks for pneumonia vaccination  He is up to date with colon cancer screening   Reviewed patient's fall risk Negative screening for depression Patient completing ADLs without difficulty Discussed recommendations for regular physical activity and well-balanced diet       Relevant Orders   EKG 12-Lead (Completed)     Return in about 2 weeks (around 01/02/2022) for HTN and PNA vaccine .     I, [name], MD, have reviewed all documentation for this visit. The documentation on 01/02/22 for the exam, diagnosis, procedures, and orders are all accurate and complete.    [name], MD  [facility] [phone] (phone) [phone] (fax)  [place]

## 2021-12-19 NOTE — Assessment & Plan Note (Signed)
Chronic, stable  Follows with Dr. Armando Reichert He will continue albuterol PRN, advair 50-19mcg twice daily

## 2021-12-29 ENCOUNTER — Other Ambulatory Visit: Payer: Self-pay | Admitting: Family Medicine

## 2021-12-29 DIAGNOSIS — I10 Essential (primary) hypertension: Secondary | ICD-10-CM

## 2022-01-02 ENCOUNTER — Encounter: Payer: Self-pay | Admitting: Family Medicine

## 2022-01-02 ENCOUNTER — Ambulatory Visit (INDEPENDENT_AMBULATORY_CARE_PROVIDER_SITE_OTHER): Payer: Medicare Other | Admitting: Family Medicine

## 2022-01-02 VITALS — BP 135/77 | HR 80 | Resp 16 | Wt 207.9 lb

## 2022-01-02 DIAGNOSIS — R053 Chronic cough: Secondary | ICD-10-CM | POA: Diagnosis not present

## 2022-01-02 MED ORDER — PREDNISONE 20 MG PO TABS: ORAL_TABLET | ORAL | 0 refills | Status: AC

## 2022-01-02 NOTE — Progress Notes (Signed)
I,[name],acting as a scribe for [employer], MD.,have documented all relevant documentation on the behalf of [name] Ramp, MD,as directed by  [name] Ramp, MD while in the presence of [name] Ramp, MD.   Established patient visit   Patient: [name] [name]   DOB: [birth year]   66 y.o. Male  MRN: [id number] Visit Date: 01/02/2022  Today's healthcare provider: [name], MD   No chief complaint on file.  Subjective    HPI  Hypertension: Patient here for follow-up of elevated blood pressure.Management from last office visit include continue hydrochlorothiazide 25 mg daily and losartan 100 mg daily and verapamil 240 mg twice daily. He is not exercising and is adherent to low salt diet.  Blood pressure is well controlled at home in the 120-130/70.Cardiac symptoms none. Patient denies chest pain, chest pressure/discomfort, exertional chest pressure/discomfort, fatigue, irregular heart beat, lower extremity edema, near-syncope, palpitations, and syncope.   BP Readings from Last 3 Encounters:  01/02/22 135/77  12/19/21 123/74  11/19/21 132/78     Medications: Outpatient Medications Prior to Visit  Medication Sig   albuterol (VENTOLIN HFA) 108 (90 Base) MCG/ACT inhaler Inhale 1-2 puffs into the lungs every 6 (six) hours as needed for wheezing or shortness of breath.   Calcium Carb-Cholecalciferol (CALCIUM 600+D) 600-800 MG-UNIT TABS Take 1 tablet by mouth daily.   DEXILANT 60 MG capsule TAKE ONE CAPSULE BY MOUTH ONE TIME DAILY   dextromethorphan-guaiFENesin (MUCINEX DM) 30-600 MG 12hr tablet Take 1 tablet by mouth 2 (two) times daily as needed for cough.   dutasteride (AVODART) 0.5 MG capsule Take 1 capsule (0.5 mg total) by mouth daily.   famotidine (PEPCID) 40 MG tablet TAKE ONE TABLET BY MOUTH DAILY (Patient taking differently: Take 40 mg by mouth at bedtime.)   fluticasone (FLONASE) 50 MCG/ACT nasal spray  Place 1 spray into both nostrils daily.   fluticasone-salmeterol (ADVAIR DISKUS) 500-50 MCG/ACT AEPB Inhale 1 puff into the lungs in the morning and at bedtime.   hydrochlorothiazide (HYDRODIURIL) 25 MG tablet TAKE ONE TABLET BY MOUTH ONE TIME DAILY   ketotifen (ZADITOR) 0.025 % ophthalmic solution Place 1 drop into both eyes daily.    losartan (COZAAR) 100 MG tablet TAKE ONE TABLET BY MOUTH ONE TIME DAILY   Misc Natural Products (GLUCOSAMINE CHONDROITIN TRIPLE) TABS Take 1 tablet by mouth 2 (two) times daily.   Multiple Vitamin (MULTI-VITAMINS) TABS Take 1 tablet by mouth daily.    Multiple Vitamins-Minerals (PRESERVISION AREDS 2) CAPS Take 1 capsule by mouth 2 (two) times daily.   tadalafil (CIALIS) 5 MG tablet Take 1 tablet (5 mg total) by mouth at bedtime.   tamsulosin (FLOMAX) 0.4 MG CAPS capsule Take 1 capsule (0.4 mg total) by mouth daily.   Testosterone 20.25 MG/ACT (1.62%) GEL 1 pump applied to each shoulder daily   Turmeric 500 MG CAPS Take 500 mg by mouth 2 (two) times daily.    verapamil (CALAN-SR) 240 MG CR tablet TAKE ONE TABLET BY MOUTH TWICE A DAY   No facility-administered medications prior to visit.    Review of Systems     Objective    BP 135/77 (BP Location: Left Arm, Patient Position: Sitting, Cuff Size: Large)   Pulse 80   Resp 16   Wt 207 lb 14.4 oz (94.3 kg)   SpO2 96%   BMI 28.20 kg/m    Physical Exam Constitutional:      General: He is not in acute distress.    Appearance: He  is not ill-appearing or toxic-appearing.     Comments: Patient is with pleasant demeanor, intermittently coughing throughout exam  Cardiovascular:     Rate and Rhythm: Normal rate and regular rhythm.     Heart sounds: Normal heart sounds. No murmur heard.    No friction rub. No gallop.  Pulmonary:     Effort: No accessory muscle usage or respiratory distress.     Breath sounds: No stridor. Examination of the right-upper field reveals wheezing and rhonchi. Examination of the  left-upper field reveals wheezing and rhonchi. Examination of the right-middle field reveals wheezing and rhonchi. Examination of the left-middle field reveals wheezing and rhonchi. Examination of the right-lower field reveals wheezing and rhonchi. Examination of the left-lower field reveals wheezing and rhonchi. Wheezing and rhonchi present. No rales.  Neurological:     Mental Status: He is alert and oriented to person, place, and time.       No results found for any visits on 01/02/22.  Assessment & Plan     Problem List Items Addressed This Visit       Respiratory   Refractory chronic cough - Primary    Chronic cough that is not responding to Advair, albuterol Patient followed by pulmonology for asthma He has appointment scheduled for next Friday Patient had CT of his chest that showed right lower lobe atypical inflammation concerning for Mycobacterium He will undergo follow-up imaging in the next few weeks when he follows up with pulmonology In the meantime, we will prescribe steroid burst over the course of 5 days I will also test quant gold today based on concerns from recent CT of the chest He will follow-up in 2 months, preferably after he has been able to follow-up with his pulmonologist       Relevant Orders   QuantiFERON-TB Gold Plus     Return in about 2 months (around 03/04/2022) for Call follow-up.     Cain Sieve Simmons-Robinson MD, have reviewed all documentation for this visit. The documentation for the exam, diagnosis, procedures, and orders are all accurate and complete.    [name], MD  [facility] [phone] (phone) [phone] (fax)  [place]

## 2022-01-02 NOTE — Addendum Note (Signed)
Addended by: Adolph Pollack on: 01/02/2022 02:25 PM   Modules accepted: Level of Service

## 2022-01-02 NOTE — Assessment & Plan Note (Signed)
Chronic cough that is not responding to Advair, albuterol Patient followed by pulmonology for asthma He has appointment scheduled for next Friday Patient had CT of his chest that showed right lower lobe atypical inflammation concerning for Mycobacterium He will undergo follow-up imaging in the next few weeks when he follows up with pulmonology In the meantime, we will prescribe steroid burst over the course of 5 days I will also test quant gold today based on concerns from recent CT of the chest He will follow-up in 2 months, preferably after he has been able to follow-up with his pulmonologist

## 2022-01-06 ENCOUNTER — Telehealth: Payer: Self-pay | Admitting: Family Medicine

## 2022-01-06 ENCOUNTER — Encounter (INDEPENDENT_AMBULATORY_CARE_PROVIDER_SITE_OTHER): Payer: Self-pay

## 2022-01-06 DIAGNOSIS — I10 Essential (primary) hypertension: Secondary | ICD-10-CM

## 2022-01-06 LAB — QUANTIFERON-TB GOLD PLUS
QuantiFERON Mitogen Value: >10 IU/mL
QuantiFERON Nil Value: 0 IU/mL
QuantiFERON TB1 Ag Value: 0.02 IU/mL
QuantiFERON TB2 Ag Value: 0 IU/mL
QuantiFERON-TB Gold Plus: NEGATIVE

## 2022-01-06 NOTE — Telephone Encounter (Signed)
[facility] faxed refill request for the following medications:  hydrochlorothiazide (HYDRODIURIL) 25 MG tablet  verapamil (CALAN-SR) 240 MG CR tablet   Please advise.

## 2022-01-07 MED ORDER — VERAPAMIL HCL ER 240 MG PO TBCR: 240.0000 mg | EXTENDED_RELEASE_TABLET | Freq: Two times a day (BID) | ORAL | 0 refills | Status: DC

## 2022-01-07 MED ORDER — HYDROCHLOROTHIAZIDE 25 MG PO TABS: 25.0000 mg | ORAL_TABLET | Freq: Every day | ORAL | 0 refills | Status: DC

## 2022-01-10 ENCOUNTER — Encounter: Payer: Self-pay | Admitting: Student in an Organized Health Care Education/Training Program

## 2022-01-10 ENCOUNTER — Ambulatory Visit: Payer: Medicare Other | Admitting: Student in an Organized Health Care Education/Training Program

## 2022-01-10 VITALS — BP 138/80 | HR 78 | Temp 97.5°F | Ht 72.0 in | Wt 210.2 lb

## 2022-01-10 DIAGNOSIS — R0602 Shortness of breath: Secondary | ICD-10-CM

## 2022-01-10 DIAGNOSIS — J4550 Severe persistent asthma, uncomplicated: Secondary | ICD-10-CM | POA: Diagnosis not present

## 2022-01-10 DIAGNOSIS — R053 Chronic cough: Secondary | ICD-10-CM | POA: Diagnosis not present

## 2022-01-10 LAB — NITRIC OXIDE: Nitric Oxide: 41

## 2022-01-10 NOTE — Progress Notes (Signed)
Synopsis: Follow up on chronic cough and asthma  Assessment & Plan:   #Severe persistent asthma #Shortness of breath #Chronic Cough  [name] [name] is presenting for follow up on chronic cough. On exam today, he has right lower lung field rhonchi but no wheezing in his lung fields. He has not felt that the Advair has changed the trajectory of his symptoms and continues to suffer from the chronic cough. FENO today was mildly elevated at 41 ppb. Workup has included a CBC (no eosinophilia, not present historically either), allergen testing (negative IgE, no allergens detected), and aspergillus testing (negative). Overall, his asthma is likely presenting as a Th1 phenotype, and I do not believe he would benefit from further escalation in ICS doses, especially given the absence of wheezing today.  His chest CT overall does not show parenchymal abnormalities. It is notable for RLL tree in bud nodularity for which the differential includes atypical mycobacterial infections such as MAC (not TB), fungal infections, and recurrent aspiration. He did have a barium swallow test at [facility] (available in care everywhere) that is positive for severe reflux with documented aspiration. This is the most likely explanation behind his cough, RLL tree-in-bud, and difficult to control asthma. He was offered fundoplication at [facility] and will follow up with his surgeon in that regards.  the evaluation of shortness of breath, cough and wheeze that I suspect is due to poorly controlled asthma. He has had two courses of prednisone recently and many more in the months prior. I am concerned that his Symbicort is not providing sufficient inhaled steroid exposure and will step up his therapy. I will start fluticasone-salmeterol 250-50 two puffs twice daily, and have asked him to continue using his flonase. Should his symptoms quickly improve, we will consider stepping down his therapy quickly. I will order sputum induction (for NTM,  fungal, bacterial) in addition to a pulmonary function test to assess for airway obstruction. I have also asked him to continue using his Advair as he is right now. Should ICS therapy fail, biologics (Tezepelumab) could be an option for [name] [name].  - Pulmonary Function Test [facility] Only; Future - Sputum induction; Standing - AFB culture with smear; Standing - Fungus Culture with Smear; Standing - Respiratory or Resp and Sputum Culture; Standing - Nitric oxide at 41 ppb today   Return in about 3 months (around 04/12/2022).  I spent 30 minutes caring for this patient today, including preparing to see the patient, obtaining and/or reviewing separately obtained history, performing a medically appropriate examination and/or evaluation, counseling and educating the patient/family/caregiver, ordering medications, tests, or procedures, documenting clinical information in the electronic health record, and independently interpreting results (not separately reported/billed) and communicating results to the patient/family/caregiver  Armando Reichert, MD [facility] 01/10/2022 2:22 PM    End of visit medications:  No orders of the defined types were placed in this encounter.    Current Outpatient Medications:    albuterol (VENTOLIN HFA) 108 (90 Base) MCG/ACT inhaler, Inhale 1-2 puffs into the lungs every 6 (six) hours as needed for wheezing or shortness of breath., Disp: 18 g, Rfl: 0   Calcium Carb-Cholecalciferol (CALCIUM 600+D) 600-800 MG-UNIT TABS, Take 1 tablet by mouth daily., Disp: , Rfl:    DEXILANT 60 MG capsule, TAKE ONE CAPSULE BY MOUTH ONE TIME DAILY, Disp: 90 capsule, Rfl: 2   dextromethorphan-guaiFENesin (MUCINEX DM) 30-600 MG 12hr tablet, Take 1 tablet by mouth 2 (two) times daily as needed for cough., Disp: , Rfl:  dutasteride (AVODART) 0.5 MG capsule, Take 1 capsule (0.5 mg total) by mouth daily., Disp: 90 capsule, Rfl: 3   famotidine (PEPCID) 40 MG tablet, TAKE ONE  TABLET BY MOUTH DAILY (Patient taking differently: Take 40 mg by mouth at bedtime.), Disp: 30 tablet, Rfl: 11   fluticasone (FLONASE) 50 MCG/ACT nasal spray, Place 1 spray into both nostrils daily., Disp: 18.2 mL, Rfl: 11   fluticasone-salmeterol (ADVAIR DISKUS) 500-50 MCG/ACT AEPB, Inhale 1 puff into the lungs in the morning and at bedtime., Disp: 1 each, Rfl: 11   hydrochlorothiazide (HYDRODIURIL) 25 MG tablet, Take 1 tablet (25 mg total) by mouth daily., Disp: 90 tablet, Rfl: 0   ketotifen (ZADITOR) 0.025 % ophthalmic solution, Place 1 drop into both eyes daily. , Disp: , Rfl:    losartan (COZAAR) 100 MG tablet, TAKE ONE TABLET BY MOUTH ONE TIME DAILY, Disp: 90 tablet, Rfl: 1   Misc Natural Products (GLUCOSAMINE CHONDROITIN TRIPLE) TABS, Take 1 tablet by mouth 2 (two) times daily., Disp: , Rfl:    Multiple Vitamin (MULTI-VITAMINS) TABS, Take 1 tablet by mouth daily. , Disp: , Rfl:    Multiple Vitamins-Minerals (PRESERVISION AREDS 2) CAPS, Take 1 capsule by mouth 2 (two) times daily., Disp: , Rfl:    tadalafil (CIALIS) 5 MG tablet, Take 1 tablet (5 mg total) by mouth at bedtime., Disp: 90 tablet, Rfl: 3   tamsulosin (FLOMAX) 0.4 MG CAPS capsule, Take 1 capsule (0.4 mg total) by mouth daily., Disp: 90 capsule, Rfl: 3   Testosterone 20.25 MG/ACT (1.62%) GEL, 1 pump applied to each shoulder daily, Disp: 75 g, Rfl: 2   Turmeric 500 MG CAPS, Take 500 mg by mouth 2 (two) times daily. , Disp: , Rfl:    verapamil (CALAN-SR) 240 MG CR tablet, Take 1 tablet (240 mg total) by mouth 2 (two) times daily., Disp: 180 tablet, Rfl: 0   Subjective:   PATIENT ID: [name] [name] GENDER: male DOB: [birth year], MRN: [id number]  Chief Complaint  Patient presents with   Follow-up    Asthma. Constant SOB and wheezing. Cough with white sputum.    HPI  [name] [name] is a pleasant 66 year old male presenting to clinic for follow up on cough and shortness of breath.  He continues to suffer from the cough,  productive of whitish sputum and associated with wheezing. The cough is made worse at night. He does not have any fevers or chills. He was seen by his PCP and a course of prednisone prescribed without much improvement. QuantGOLD sent and was negative.  I last saw [name] [name] on 11/19/2021 for cough. At that time, he reported symptoms of cough productive of whitish (sometimes yellow) sputum, associated with shortness of breath, exertional dyspnea, and wheezing. His symptoms had been present for months. He tells me he was diagnosed with asthma many years ago by Dr. Donneta Romberg. He has had multiple exacerbations of his asthma, requiring antibiotics and steroids. He was recently seen by his PCP and given a course of prednisone that he did not feel was helpful. He is using the Advair we prescribed during our last visit but hasn't felt much change with it. His exacerbations come about without associated URTI symptoms. He has not had any sick contacts. He does not have any pets and is a non-smoker (thou reports previous second hand exposure). He does not have any chest pain or tightness, nor does he have any fevers, chills, night sweats, or weight loss. He does have a runny nose,  a problem that is chronic for him. He occasionally uses Flonase but has not used allergy pills as they are too sedating (including Zyrtec). He has always had heartburn, with his cough is worse at night.   He tells me he has a history of asthma as well as cough, and had previously been seen at a [facility]. Work up at [facility] was initiated by Achilles Dunk, MD. At that time, he was referred for low immunoglobulin levels. A high resolution CT chest in 2020 at [facility] suggested possible early bronchiectasis. He was referred to ENT and GI for further workup of sinusitis and reflux disease. Given concern for silent aspiration triggering his asthma, further workup was pursued. He was eventually referred to thoracic surgery after esophageal manometry showed a  medium to large sized hiatal hernia. Barium swallow in January of 2023 didn't show a hiatal hernia but did show severe GERD with signs of aspiration. He was offered robot assisted laparoscopic repair of his paraesophageal hernia and fundoplication which he would like to proceed with in the fall.  He is a never smoker, but reports second hand exposure. He used to work at [employer] and retired recently. He continues to be active in his church. He reports significant mold exposure at [facility], and suspects there might be mold exposure at church. He does not have any pets.  Ancillary information including prior medications, full medical/surgical/family/social histories, and PFTs (when available) are listed below and have been reviewed.   Review of Systems  Constitutional:  Negative for chills and fever.  Respiratory:  Positive for cough, sputum production, shortness of breath and wheezing.   Cardiovascular:  Negative for chest pain.  Gastrointestinal:  Positive for heartburn.  Skin:  Negative for rash.     Objective:   Vitals:   01/10/22 1356  BP: 138/80  Pulse: 78  Temp: (!) 97.5 F (36.4 C)  SpO2: 98%  Weight: 210 lb 3.2 oz (95.3 kg)  Height: 6' (1.829 m)   98% on RA  BMI Readings from Last 3 Encounters:  01/10/22 28.51 kg/m  01/02/22 28.20 kg/m  12/19/21 29.09 kg/m   Wt Readings from Last 3 Encounters:  01/10/22 210 lb 3.2 oz (95.3 kg)  01/02/22 207 lb 14.4 oz (94.3 kg)  12/19/21 214 lb 8 oz (97.3 kg)    Physical Exam Constitutional:      Appearance: Normal appearance. He is normal weight.  HENT:     Head: Normocephalic.     Mouth/Throat:     Mouth: Mucous membranes are moist.  Cardiovascular:     Rate and Rhythm: Normal rate and regular rhythm.     Pulses: Normal pulses.     Heart sounds: Normal heart sounds.  Pulmonary:     Effort: Pulmonary effort is normal.     Breath sounds: Rhonchi (right lower lung field) present. No wheezing (diffuse scattered wheezes)  or rales.  Musculoskeletal:        General: Normal range of motion.     Cervical back: Normal range of motion and neck supple.  Skin:    General: Skin is warm.  Neurological:     General: No focal deficit present.     Mental Status: He is alert and oriented to person, place, and time.       Ancillary Information    Past Medical History:  Diagnosis Date   Allergy    Asthma    BP (high blood pressure) 11/07/2014   Should stop combination of Tekturna (DRI)  and ARB    Hypertension    HYPERTENSION, SEVERE 03/15/2007   Qualifier: Diagnosis of  By: Tilden Dome     Prostate enlargement    Sleep apnea      Family History  Problem Relation Age of Onset   Cancer Mother        ovarian   Glaucoma Mother    Cancer Father        prostate   Hypertension Sister    Asthma Sister    Alcohol abuse Paternal Uncle    Liver disease Paternal Uncle    Cancer - Prostate Paternal Uncle    Cancer Maternal Uncle        lung; two mat uncles   Cancer Paternal Grandmother        breast   Cancer Paternal Aunt        lung cancer in a smoker     Past Surgical History:  Procedure Laterality Date   APPENDECTOMY  1990   Dr Bary Castilla   COLONOSCOPY  2008   Dr Bary Castilla   COLONOSCOPY WITH PROPOFOL N/A 08/13/2016   Procedure: COLONOSCOPY WITH PROPOFOL;  Surgeon: Robert Bellow, MD;  Location: [facility] ENDOSCOPY;  Service: Endoscopy;  Laterality: N/A;   HEMORRHOID SURGERY     HERNIA REPAIR  51/76/16   umbilical hernia   KNEE ARTHROSCOPY Left    KNEE SURGERY Left 1986   NASAL SEPTUM SURGERY     TONSILLECTOMY     TOTAL KNEE ARTHROPLASTY Left 03/29/2019   Procedure: TOTAL KNEE ARTHROPLASTY;  Surgeon: Corky Mull, MD;  Location: [facility] ORS;  Service: Orthopedics;  Laterality: Left;   VASECTOMY      Social History   Socioeconomic History   Marital status: Married    Spouse name: Not on file   Number of children: Not on file   Years of education: Not on file   Highest education level: Not on  file  Occupational History   Not on file  Tobacco Use   Smoking status: Never   Smokeless tobacco: Never   Tobacco comments:    Exposed to second hand smoke as a child  Substance and Sexual Activity   Alcohol use: Yes    Alcohol/week: 0.0 standard drinks of alcohol    [name]: occasionally   Drug use: No   Sexual activity: Not on file  Other Topics Concern   Not on file  Social History Narrative   Not on file   Social Determinants of Health   Financial Resource Strain: Not on file  Food Insecurity: Not on file  Transportation Needs: No Transportation Needs (11/08/2021)   PRAPARE - Hydrologist (Medical): No    Lack of Transportation (Non-Medical): No  Physical Activity: Not on file  Stress: Not on file  Social Connections: Not on file  Intimate Partner Violence: Not on file     Allergies  Allergen Reactions   Hydralazine Hives and Shortness Of Breath    Chest tightness, severe headache, dyspnea Other reaction(s): Chest Pain, Headache, Respiratory Distress   Gluten Meal Diarrhea    bloating   Moxifloxacin Hives    Avelox   Pseudoephedrine Other (See Comments)    Confusion, passed out, numbness   Sulfa Antibiotics Hives   Sulfonamide Derivatives Hives     CBC    Component Value Date/Time   WBC 9.6 11/20/2021 0850   WBC 7.3 03/29/2019 0639   RBC 4.40 11/20/2021 0850   RBC 4.52 03/29/2019 0737  HGB 14.4 11/20/2021 0850   HCT 41.1 11/20/2021 0850   PLT 243 11/20/2021 0850   MCV 93 11/20/2021 0850   MCH 32.7 11/20/2021 0850   MCH 32.5 03/29/2019 0639   MCHC 35.0 11/20/2021 0850   MCHC 34.6 03/29/2019 0639   RDW 12.5 11/20/2021 0850   LYMPHSABS 2.2 11/20/2021 0850   MONOABS 0.6 03/29/2019 0639   EOSABS 0.1 11/20/2021 0850   BASOSABS 0.1 11/20/2021 0850    Pulmonary Functions Testing Results:     No data to display          Outpatient Medications Prior to Visit  Medication Sig Dispense Refill   albuterol (VENTOLIN HFA)  108 (90 Base) MCG/ACT inhaler Inhale 1-2 puffs into the lungs every 6 (six) hours as needed for wheezing or shortness of breath. 18 g 0   Calcium Carb-Cholecalciferol (CALCIUM 600+D) 600-800 MG-UNIT TABS Take 1 tablet by mouth daily.     DEXILANT 60 MG capsule TAKE ONE CAPSULE BY MOUTH ONE TIME DAILY 90 capsule 2   dextromethorphan-guaiFENesin (MUCINEX DM) 30-600 MG 12hr tablet Take 1 tablet by mouth 2 (two) times daily as needed for cough.     dutasteride (AVODART) 0.5 MG capsule Take 1 capsule (0.5 mg total) by mouth daily. 90 capsule 3   famotidine (PEPCID) 40 MG tablet TAKE ONE TABLET BY MOUTH DAILY (Patient taking differently: Take 40 mg by mouth at bedtime.) 30 tablet 11   fluticasone (FLONASE) 50 MCG/ACT nasal spray Place 1 spray into both nostrils daily. 18.2 mL 11   fluticasone-salmeterol (ADVAIR DISKUS) 500-50 MCG/ACT AEPB Inhale 1 puff into the lungs in the morning and at bedtime. 1 each 11   hydrochlorothiazide (HYDRODIURIL) 25 MG tablet Take 1 tablet (25 mg total) by mouth daily. 90 tablet 0   ketotifen (ZADITOR) 0.025 % ophthalmic solution Place 1 drop into both eyes daily.      losartan (COZAAR) 100 MG tablet TAKE ONE TABLET BY MOUTH ONE TIME DAILY 90 tablet 1   Misc Natural Products (GLUCOSAMINE CHONDROITIN TRIPLE) TABS Take 1 tablet by mouth 2 (two) times daily.     Multiple Vitamin (MULTI-VITAMINS) TABS Take 1 tablet by mouth daily.      Multiple Vitamins-Minerals (PRESERVISION AREDS 2) CAPS Take 1 capsule by mouth 2 (two) times daily.     tadalafil (CIALIS) 5 MG tablet Take 1 tablet (5 mg total) by mouth at bedtime. 90 tablet 3   tamsulosin (FLOMAX) 0.4 MG CAPS capsule Take 1 capsule (0.4 mg total) by mouth daily. 90 capsule 3   Testosterone 20.25 MG/ACT (1.62%) GEL 1 pump applied to each shoulder daily 75 g 2   Turmeric 500 MG CAPS Take 500 mg by mouth 2 (two) times daily.      verapamil (CALAN-SR) 240 MG CR tablet Take 1 tablet (240 mg total) by mouth 2 (two) times daily. 180  tablet 0   No facility-administered medications prior to visit.

## 2022-01-15 ENCOUNTER — Other Ambulatory Visit
Admission: RE | Admit: 2022-01-15 | Discharge: 2022-01-15 | Disposition: A | Discharge status: Home / Self Care | Payer: Medicare Other | Attending: Student in an Organized Health Care Education/Training Program | Admitting: Student in an Organized Health Care Education/Training Program

## 2022-01-15 DIAGNOSIS — R053 Chronic cough: Secondary | ICD-10-CM | POA: Diagnosis present

## 2022-01-15 DIAGNOSIS — R0602 Shortness of breath: Secondary | ICD-10-CM | POA: Diagnosis present

## 2022-01-15 DIAGNOSIS — J4550 Severe persistent asthma, uncomplicated: Secondary | ICD-10-CM | POA: Insufficient documentation

## 2022-01-15 LAB — EXPECTORATED SPUTUM ASSESSMENT W GRAM STAIN, RFLX TO RESP C

## 2022-01-15 MED ORDER — SODIUM CHLORIDE 3 % IN NEBU: 4.0000 mL | INHALATION_SOLUTION | Freq: Once | RESPIRATORY_TRACT | Status: DC

## 2022-01-15 MED ORDER — SODIUM CHLORIDE 3 % IN NEBU
4.0000 mL | INHALATION_SOLUTION | Freq: Once | RESPIRATORY_TRACT | Status: AC
  Administered 2022-01-15: 4 mL via RESPIRATORY_TRACT
  Filled 2022-01-15 (×2): qty 4

## 2022-01-16 LAB — FUNGUS CULTURE WITH STAIN

## 2022-01-18 LAB — CULTURE, RESPIRATORY W GRAM STAIN: Culture: NORMAL

## 2022-01-18 LAB — ACID FAST SMEAR (AFB, MYCOBACTERIA): Acid Fast Smear: NEGATIVE

## 2022-01-29 ENCOUNTER — Ambulatory Visit
Admission: EM | Admit: 2022-01-29 | Discharge: 2022-01-29 | Disposition: A | Discharge status: Home / Self Care | Payer: Medicare Other | Attending: Family Medicine | Admitting: Family Medicine

## 2022-01-29 DIAGNOSIS — J4541 Moderate persistent asthma with (acute) exacerbation: Secondary | ICD-10-CM | POA: Insufficient documentation

## 2022-01-29 DIAGNOSIS — J069 Acute upper respiratory infection, unspecified: Secondary | ICD-10-CM | POA: Diagnosis present

## 2022-01-29 DIAGNOSIS — Z1152 Encounter for screening for COVID-19: Secondary | ICD-10-CM | POA: Insufficient documentation

## 2022-01-29 MED ORDER — CHERATUSSIN AC 100-10 MG/5ML PO SOLN: 5.0000 mL | Freq: Four times a day (QID) | ORAL | 0 refills | Status: DC | PRN

## 2022-01-29 MED ORDER — TRIAMCINOLONE ACETONIDE 40 MG/ML IJ SUSP: 40.0000 mg | Freq: Once | INTRAMUSCULAR | Status: DC

## 2022-01-29 MED ORDER — DEXAMETHASONE SODIUM PHOSPHATE 10 MG/ML IJ SOLN
20.0000 mg | Freq: Once | INTRAMUSCULAR | Status: AC
  Administered 2022-01-29: 20 mg via INTRAMUSCULAR

## 2022-01-29 NOTE — ED Triage Notes (Addendum)
Patient to Urgent Care with complaints of cough and fevers. Max temp 99.7 temporal. Symptoms started this morning.   Hx of bronchitis/ asthma.   Reports he had been taking cough medications and tylenol. Reports he watched his grandson yesterday who has croup.

## 2022-01-29 NOTE — ED Provider Notes (Addendum)
[name] [name]    CSN: [id number] Arrival date & time: 01/29/22  1821      History   Chief Complaint Chief Complaint  Patient presents with   Cough   Fever    HPI [name] [name] is a 66 y.o. male.    Cough Associated symptoms: fever   Fever Associated symptoms: cough    For cough in postnasal congestion.  He has been wheezing some more than usual.  Temperature has been as high as 99.7.  He was around his grandson who had the croup  Past Medical History:  Diagnosis Date   Allergy    Asthma    BP (high blood pressure) 11/07/2014   Should stop combination of Tekturna (DRI)  and ARB    Hypertension    HYPERTENSION, SEVERE 03/15/2007   Qualifier: Diagnosis of  By: Vernie Murders     Prostate enlargement    Sleep apnea     Patient Active Problem List   Diagnosis Date Noted   Refractory chronic cough 01/02/2022   Welcome to Medicare preventive visit 12/19/2021   Essential hypertension 12/19/2021   Severe persistent asthma 09/26/2021   Erectile dysfunction due to arterial insufficiency 07/15/2018   Synovitis of elbow 08/17/2017   Thrombosed external hemorrhoid 05/10/2015   Allergic rhinitis 11/07/2014   Arthralgia of hand 11/07/2014   Benign prostatic hyperplasia with lower urinary tract symptoms 11/07/2014   Impotence of organic origin 11/07/2014   Acid reflux 11/07/2014   Hypogonadism in male 11/07/2014   Obstructive apnea 11/07/2014   Plantar fasciitis 11/07/2014   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   Testicular hypofunction 10/26/2013   Pain in soft tissues of limb 10/26/2013   Palpitations 10/26/2013   Personal history of perinatal problems 10/26/2013   ACID REFLUX DISEASE 03/15/2007   COUGH, CHRONIC 03/15/2007    Past Surgical History:  Procedure Laterality Date   APPENDECTOMY  1990   Dr Lemar Livings   COLONOSCOPY  2008   Dr Lemar Livings   COLONOSCOPY WITH PROPOFOL N/A 08/13/2016   Procedure: COLONOSCOPY WITH PROPOFOL;   Surgeon: Earline Mayotte, MD;  Location: [facility] ENDOSCOPY;  Service: Endoscopy;  Laterality: N/A;   HEMORRHOID SURGERY     HERNIA REPAIR  01/20/14   umbilical hernia   KNEE ARTHROSCOPY Left    KNEE SURGERY Left 1986   NASAL SEPTUM SURGERY     TONSILLECTOMY     TOTAL KNEE ARTHROPLASTY Left 03/29/2019   Procedure: TOTAL KNEE ARTHROPLASTY;  Surgeon: Christena Flake, MD;  Location: [facility] ORS;  Service: Orthopedics;  Laterality: Left;   VASECTOMY         Home Medications    Prior to Admission medications   Medication Sig Start Date End Date Taking? Authorizing Provider  guaiFENesin-codeine (CHERATUSSIN AC) 100-10 MG/5ML syrup Take 5 mLs by mouth 4 (four) times daily as needed for cough. 01/29/22  Yes Zenia Resides, MD  albuterol (VENTOLIN HFA) 108 (90 Base) MCG/ACT inhaler Inhale 1-2 puffs into the lungs every 6 (six) hours as needed for wheezing or shortness of breath. 11/08/21   Maple Hudson., MD  Calcium Carb-Cholecalciferol (CALCIUM 600+D) 600-800 MG-UNIT TABS Take 1 tablet by mouth daily.    [provider]  DEXILANT 60 MG capsule TAKE ONE CAPSULE BY MOUTH ONE TIME DAILY 09/16/21   Maple Hudson., MD  dextromethorphan-guaiFENesin [facility] DM) 30-600 MG 12hr tablet Take 1 tablet by mouth 2 (two) times daily as needed for cough.  [provider]  dutasteride (AVODART) 0.5 MG capsule Take 1 capsule (0.5 mg total) by mouth daily. 09/18/21   Stoioff, Verna Czech, MD  famotidine (PEPCID) 40 MG tablet TAKE ONE TABLET BY MOUTH DAILY Patient taking differently: Take 40 mg by mouth at bedtime. 04/28/18   Maple Hudson., MD  fluticasone [facility]) 50 MCG/ACT nasal spray Place 1 spray into both nostrils daily. 11/19/21 11/19/22  Raechel Chute, MD  fluticasone-salmeterol (ADVAIR DISKUS) 500-50 MCG/ACT AEPB Inhale 1 puff into the lungs in the morning and at bedtime. 12/06/21   Raechel Chute, MD  hydrochlorothiazide (HYDRODIURIL) 25 MG tablet Take 1 tablet (25 mg  total) by mouth daily. 01/07/22   Simmons-Robinson, Makiera, MD  ketotifen (ZADITOR) 0.025 % ophthalmic solution Place 1 drop into both eyes daily.     [provider]  losartan (COZAAR) 100 MG tablet TAKE ONE TABLET BY MOUTH ONE TIME DAILY 12/30/21   Simmons-Robinson, Tawanna Cooler, MD  Misc Natural Products (GLUCOSAMINE CHONDROITIN TRIPLE) TABS Take 1 tablet by mouth 2 (two) times daily.    [provider]  Multiple Vitamin (MULTI-VITAMINS) TABS Take 1 tablet by mouth daily.     [provider]  Multiple Vitamins-Minerals (PRESERVISION AREDS 2) CAPS Take 1 capsule by mouth 2 (two) times daily.    [provider]  tadalafil (CIALIS) 5 MG tablet Take 1 tablet (5 mg total) by mouth at bedtime. 09/18/21   Stoioff, Verna Czech, MD  tamsulosin (FLOMAX) 0.4 MG CAPS capsule Take 1 capsule (0.4 mg total) by mouth daily. 09/18/21   Stoioff, Verna Czech, MD  Testosterone 20.25 MG/ACT (1.62%) GEL 1 pump applied to each shoulder daily 09/18/21   Stoioff, Verna Czech, MD  Turmeric 500 MG CAPS Take 500 mg by mouth 2 (two) times daily.     [provider]  verapamil (CALAN-SR) 240 MG CR tablet Take 1 tablet (240 mg total) by mouth 2 (two) times daily. 01/07/22   Simmons-Robinson, Tawanna Cooler, MD    Family History Family History  Problem Relation Age of Onset   Cancer Mother        ovarian   Glaucoma Mother    Cancer Father        prostate   Hypertension Sister    Asthma Sister    Alcohol abuse Paternal Uncle    Liver disease Paternal Uncle    Cancer - Prostate Paternal Uncle    Cancer Maternal Uncle        lung; two mat uncles   Cancer Paternal Grandmother        breast   Cancer Paternal Aunt        lung cancer in a smoker    Social History Social History   Tobacco Use   Smoking status: Never   Smokeless tobacco: Never   Tobacco comments:    Exposed to second hand smoke as a child  Substance Use Topics   Alcohol use: Yes    Alcohol/week: 0.0 standard drinks of  alcohol    Comment: occasionally   Drug use: No     Allergies   Hydralazine, Gluten meal, Moxifloxacin, Pseudoephedrine, Sulfa antibiotics, and Sulfonamide derivatives   Review of Systems Review of Systems  Constitutional:  Positive for fever.  Respiratory:  Positive for cough.      Physical Exam Triage Vital Signs ED Triage Vitals  Enc Vitals Group     BP 01/29/22 1904 (!) 146/85     Pulse Rate 01/29/22 1904 94     Resp  01/29/22 1904 18     Temp 01/29/22 1904 98.7 F (37.1 C)     Temp src --      SpO2 01/29/22 1904 94 %     Weight 01/29/22 1907 210 lb 3.2 oz (95.3 kg)     Height 01/29/22 1907 6' (1.829 m)     Head Circumference --      Peak Flow --      Pain Score 01/29/22 1904 6     Pain Loc --      Pain Edu? --      Excl. in GC? --    No data found.  Updated Vital Signs BP (!) 146/85   Pulse 94   Temp 98.7 F (37.1 C)   Resp 18   Ht 6' (1.829 m)   Wt 95.3 kg   SpO2 94%   BMI 28.51 kg/m   Visual Acuity Right Eye Distance:   Left Eye Distance:   Bilateral Distance:    Right Eye Near:   Left Eye Near:    Bilateral Near:     Physical Exam Vitals reviewed.  Constitutional:      General: He is not in acute distress.    Appearance: He is not toxic-appearing.  HENT:     Right Ear: Tympanic membrane and ear canal normal.     Left Ear: Tympanic membrane and ear canal normal.     Nose: Nose normal.     Mouth/Throat:     Mouth: Mucous membranes are moist.     Pharynx: No oropharyngeal exudate or posterior oropharyngeal erythema.  Eyes:     Extraocular Movements: Extraocular movements intact.     Conjunctiva/sclera: Conjunctivae normal.     Pupils: Pupils are equal, round, and reactive to light.  Cardiovascular:     Rate and Rhythm: Normal rate and regular rhythm.     Heart sounds: No murmur heard. Pulmonary:     Breath sounds: No stridor. No rhonchi or rales.     Comments: Does have a tight wheezy cough.  Air movement is good Musculoskeletal:      Cervical back: Neck supple.  Lymphadenopathy:     Cervical: No cervical adenopathy.  Skin:    Capillary Refill: Capillary refill takes less than 2 seconds.     Coloration: Skin is not jaundiced or pale.  Neurological:     General: No focal deficit present.     Mental Status: He is alert and oriented to person, place, and time.  Psychiatric:        Behavior: Behavior normal.      UC Treatments / Results  Labs (all labs ordered are listed, but only abnormal results are displayed) Labs Reviewed  RESP PANEL BY RT-PCR (RSV, FLU A&B, COVID)  RVPGX2    EKG   Radiology No results found.  Procedures Procedures (including critical care time)  Medications Ordered in UC Medications  triamcinolone acetonide (KENALOG-40) injection 40 mg (has no administration in time range)  dexamethasone (DECADRON) injection 20 mg (has no administration in time range)    Initial Impression / Assessment and Plan / UC Course  I have reviewed the triage vital signs and the nursing notes.  Pertinent labs & imaging results that were available during my care of the patient were reviewed by me and considered in my medical decision making (see chart for details).         Final Clinical Impressions(s) / UC Diagnoses   Final diagnoses:  Viral URI with cough  Moderate persistent asthma with acute exacerbation     Discharge Instructions       You have been swabbed for COVID/flu and RSV, and the test will result in the next 24 hours. Our staff will call you if positive. If the COVID test is positive, you should quarantine for 5 days from the start of your symptoms  Robitussin with codeine cough syrup--5 mL by mouth 4 times daily as needed for cough.  Have been given an injection of triamcinolone 40 mg as a steroid      ED Prescriptions     Medication Sig Dispense Auth. Provider   guaiFENesin-codeine (CHERATUSSIN AC) 100-10 MG/5ML syrup Take 5 mLs by mouth 4 (four) times daily as  needed for cough. 120 mL Zenia Resides, MD      I have reviewed the PDMP during this encounter.   Zenia Resides, MD 01/29/22 Serena Croissant    Zenia Resides, MD 01/29/22 Serena Croissant    Zenia Resides, MD 01/29/22 [phone]

## 2022-01-29 NOTE — Discharge Instructions (Addendum)
  You have been swabbed for COVID/flu and RSV, and the test will result in the next 24 hours. Our staff will call you if positive. If the COVID test is positive, you should quarantine for 5 days from the start of your symptoms  Robitussin with codeine cough syrup--5 mL by mouth 4 times daily as needed for cough.  Have been given an injection of decadron 20 mg as a steroid

## 2022-01-31 LAB — RESP PANEL BY RT-PCR (RSV, FLU A&B, COVID)  RVPGX2
Influenza A by PCR: NEGATIVE
Influenza B by PCR: NEGATIVE
Resp Syncytial Virus by PCR: NEGATIVE
SARS Coronavirus 2 by RT PCR: NEGATIVE

## 2022-02-01 ENCOUNTER — Emergency Department: Payer: Medicare Other

## 2022-02-01 ENCOUNTER — Ambulatory Visit
Admission: RE | Admit: 2022-02-01 | Discharge: 2022-02-01 | Disposition: A | Discharge status: Home / Self Care | Payer: Medicare Other | Source: Ambulatory Visit | Attending: Urgent Care | Admitting: Urgent Care

## 2022-02-01 ENCOUNTER — Other Ambulatory Visit: Payer: Self-pay

## 2022-02-01 ENCOUNTER — Emergency Department
Admission: EM | Admit: 2022-02-01 | Discharge: 2022-02-01 | Disposition: A | Discharge status: Home / Self Care | Payer: Medicare Other | Attending: Emergency Medicine | Admitting: Emergency Medicine

## 2022-02-01 VITALS — BP 132/75 | HR 102 | Temp 102.2°F | Resp 18

## 2022-02-01 DIAGNOSIS — J45909 Unspecified asthma, uncomplicated: Secondary | ICD-10-CM | POA: Diagnosis not present

## 2022-02-01 DIAGNOSIS — J189 Pneumonia, unspecified organism: Secondary | ICD-10-CM | POA: Insufficient documentation

## 2022-02-01 DIAGNOSIS — R000 Tachycardia, unspecified: Secondary | ICD-10-CM | POA: Insufficient documentation

## 2022-02-01 DIAGNOSIS — R0602 Shortness of breath: Secondary | ICD-10-CM | POA: Diagnosis present

## 2022-02-01 DIAGNOSIS — D72829 Elevated white blood cell count, unspecified: Secondary | ICD-10-CM | POA: Diagnosis not present

## 2022-02-01 DIAGNOSIS — R509 Fever, unspecified: Secondary | ICD-10-CM | POA: Diagnosis not present

## 2022-02-01 DIAGNOSIS — I10 Essential (primary) hypertension: Secondary | ICD-10-CM | POA: Insufficient documentation

## 2022-02-01 LAB — CBC WITH DIFFERENTIAL/PLATELET
Abs Immature Granulocytes: 0.05 10*3/uL (ref 0.00–0.07)
Basophils Absolute: 0.1 10*3/uL (ref 0.0–0.1)
Basophils Relative: 0 %
Eosinophils Absolute: 0 10*3/uL (ref 0.0–0.5)
Eosinophils Relative: 0 %
HCT: 37.7 % — ABNORMAL LOW (ref 39.0–52.0)
Hemoglobin: 12.7 g/dL — ABNORMAL LOW (ref 13.0–17.0)
Immature Granulocytes: 0 %
Lymphocytes Relative: 5 %
Lymphs Abs: 0.6 10*3/uL — ABNORMAL LOW (ref 0.7–4.0)
MCH: 31.4 pg (ref 26.0–34.0)
MCHC: 33.7 g/dL (ref 30.0–36.0)
MCV: 93.1 fL (ref 80.0–100.0)
Monocytes Absolute: 0.7 10*3/uL (ref 0.1–1.0)
Monocytes Relative: 5 %
Neutro Abs: 10.9 10*3/uL — ABNORMAL HIGH (ref 1.7–7.7)
Neutrophils Relative %: 90 %
Platelets: 201 10*3/uL (ref 150–400)
RBC: 4.05 MIL/uL — ABNORMAL LOW (ref 4.22–5.81)
RDW: 14.1 % (ref 11.5–15.5)
WBC: 12.3 10*3/uL — ABNORMAL HIGH (ref 4.0–10.5)
nRBC: 0 % (ref 0.0–0.2)

## 2022-02-01 LAB — COMPREHENSIVE METABOLIC PANEL
ALT: 16 U/L (ref 0–44)
AST: 17 U/L (ref 15–41)
Albumin: 3.3 g/dL — ABNORMAL LOW (ref 3.5–5.0)
Alkaline Phosphatase: 59 U/L (ref 38–126)
Anion gap: 11 (ref 5–15)
BUN: 19 mg/dL (ref 8–23)
CO2: 23 mmol/L (ref 22–32)
Calcium: 8.4 mg/dL — ABNORMAL LOW (ref 8.9–10.3)
Chloride: 103 mmol/L (ref 98–111)
Creatinine, Ser: 1.15 mg/dL (ref 0.61–1.24)
GFR, Estimated: >60 mL/min (ref >60)
Glucose, Bld: 103 mg/dL — ABNORMAL HIGH (ref 70–99)
Potassium: 3.4 mmol/L — ABNORMAL LOW (ref 3.5–5.1)
Sodium: 137 mmol/L (ref 135–145)
Total Bilirubin: 1.3 mg/dL — ABNORMAL HIGH (ref 0.3–1.2)
Total Protein: 6.1 g/dL — ABNORMAL LOW (ref 6.5–8.1)

## 2022-02-01 MED ORDER — IBUPROFEN 400 MG PO TABS: 400.0000 mg | ORAL_TABLET | Freq: Once | ORAL | Status: DC

## 2022-02-01 MED ORDER — AZITHROMYCIN 500 MG PO TABS
500.0000 mg | ORAL_TABLET | Freq: Once | ORAL | Status: AC
  Administered 2022-02-01: 500 mg via ORAL
  Filled 2022-02-01: qty 1

## 2022-02-01 MED ORDER — AMOXICILLIN-POT CLAVULANATE 875-125 MG PO TABS: 1.0000 | ORAL_TABLET | Freq: Two times a day (BID) | ORAL | 0 refills | Status: AC

## 2022-02-01 MED ORDER — AZITHROMYCIN 250 MG PO TABS: ORAL_TABLET | ORAL | 0 refills | Status: AC

## 2022-02-01 MED ORDER — AMOXICILLIN-POT CLAVULANATE 875-125 MG PO TABS
1.0000 | ORAL_TABLET | Freq: Once | ORAL | Status: AC
  Administered 2022-02-01: 1 via ORAL
  Filled 2022-02-01: qty 1

## 2022-02-01 NOTE — ED Triage Notes (Signed)
Pt. Presents to UC w/ c/o a productive cough, fever, and SOB for the past 3 days. Pt. States he was seen and tested for COVID/FLU/RSV Wednesday and the results were negative.

## 2022-02-01 NOTE — ED Provider Notes (Signed)
[name]    CSN: [id number] Arrival date & time: 02/01/22  0844      History   Chief Complaint Chief Complaint  Patient presents with   Fever    Fever, chills, constant cough that produces green mucus. Hurts to take deep breaths. Fatigue. - Entered by patient   Cough   Shortness of Breath    HPI [name] [name] is a 66 y.o. male.    Fever Associated symptoms: cough   Cough Associated symptoms: fever and shortness of breath   Shortness of Breath Associated symptoms: cough and fever     This urgent care with complaint of productive cough, fever, shortness of breath x 3 days.  Patient says he was seen and tested for COVID/flu/RSV 3 days ago and negative.  Chart indicates patient was treated with an injection of triamcinolone 40 mg during that visit and discharged with Robitussin with codeine cough syrup.  Patient states that he felt better the day after being seen but symptoms acutely worsened at night (Thursday).  He reports that the symptoms of cough are somewhat improved with the Robitussin but he still is unable to sleep at night.  He last took Tylenol at about 2 AM.  Body temperature is 102.2 F in clinic.  Past Medical History:  Diagnosis Date   Allergy    Asthma    BP (high blood pressure) 11/07/2014   Should stop combination of Tekturna (DRI)  and ARB    Hypertension    HYPERTENSION, SEVERE 03/15/2007   Qualifier: Diagnosis of  By: Vernie Murdersaskin, Leslie     Prostate enlargement    Sleep apnea     Patient Active Problem List   Diagnosis Date Noted   Refractory chronic cough 01/02/2022   Welcome to Medicare preventive visit 12/19/2021   Essential hypertension 12/19/2021   Severe persistent asthma 09/26/2021   Erectile dysfunction due to arterial insufficiency 07/15/2018   Synovitis of elbow 08/17/2017   Thrombosed external hemorrhoid 05/10/2015   Allergic rhinitis 11/07/2014   Arthralgia of hand 11/07/2014   Benign prostatic hyperplasia  with lower urinary tract symptoms 11/07/2014   Impotence of organic origin 11/07/2014   Acid reflux 11/07/2014   Hypogonadism in male 11/07/2014   Obstructive apnea 11/07/2014   Plantar fasciitis 11/07/2014   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   Testicular hypofunction 10/26/2013   Pain in soft tissues of limb 10/26/2013   Palpitations 10/26/2013   Personal history of perinatal problems 10/26/2013   ACID REFLUX DISEASE 03/15/2007   COUGH, CHRONIC 03/15/2007    Past Surgical History:  Procedure Laterality Date   APPENDECTOMY  1990   Dr Lemar LivingsByrnett   COLONOSCOPY  2008   Dr Lemar LivingsByrnett   COLONOSCOPY WITH PROPOFOL N/A 08/13/2016   Procedure: COLONOSCOPY WITH PROPOFOL;  Surgeon: [name], [name], MD;  [facility] END[facility]COPY;  Service: Endoscopy;  Laterality: N/A;   HEMORRHOID SURGERY     HERNIA REPAIR  01/20/14   umbilical hernia   KNEE ARTHROSCOPY Left    KNEE SURGERY Left 1986   NASAL SEPTUM SURGERY     TONSILLECTOMY     TOTAL KNEE ARTHROPLASTY Left 03/29/2019   Procedure: TOTAL KNEE ARTHROPLASTY;  Surgeon: Christena FlakePoggi, John J, MD;  Location: [facility] ORS;  Service: Orthopedics;  Laterality: Left;   VASECTOMY         Home Medications    Prior to Admission medications   Medication Sig Start Date End Date Taking? Authorizing Provider  albuterol (VENTOLIN HFA) 108 (  90 Base) MCG/ACT inhaler Inhale 1-2 puffs into the lungs every 6 (six) hours as needed for wheezing or shortness of breath. 11/08/21   Maple Hudson., MD  Calcium Carb-Cholecalciferol (CALCIUM 600+D) 600-800 MG-UNIT TABS Take 1 tablet by mouth daily.    [provider]  DEXILANT 60 MG capsule TAKE ONE CAPSULE BY MOUTH ONE TIME DAILY 09/16/21   Maple Hudson., MD  dextromethorphan-guaiFENesin [facility] DM) 30-600 MG 12hr tablet Take 1 tablet by mouth 2 (two) times daily as needed for cough.    [provider]  dutasteride (AVODART) 0.5 MG capsule Take 1 capsule (0.5 mg total) by  mouth daily. 09/18/21   Stoioff, Verna Czech, MD  famotidine (PEPCID) 40 MG tablet TAKE ONE TABLET BY MOUTH DAILY Patient taking differently: Take 40 mg by mouth at bedtime. 04/28/18   Maple Hudson., MD  fluticasone [facility]) 50 MCG/ACT nasal spray Place 1 spray into both nostrils daily. 11/19/21 11/19/22  Raechel Chute, MD  fluticasone-salmeterol (ADVAIR DISKUS) 500-50 MCG/ACT AEPB Inhale 1 puff into the lungs in the morning and at bedtime. 12/06/21   Raechel Chute, MD  guaiFENesin-codeine (CHERATUSSIN AC) 100-10 MG/5ML syrup Take 5 mLs by mouth 4 (four) times daily as needed for cough. 01/29/22   Zenia Resides, MD  hydrochlorothiazide (HYDRODIURIL) 25 MG tablet Take 1 tablet (25 mg total) by mouth daily. 01/07/22   Simmons-Robinson, Makiera, MD  ketotifen (ZADITOR) 0.025 % ophthalmic solution Place 1 drop into both eyes daily.     [provider]  losartan (COZAAR) 100 MG tablet TAKE ONE TABLET BY MOUTH ONE TIME DAILY 12/30/21   Simmons-Robinson, Tawanna Cooler, MD  Misc Natural Products (GLUCOSAMINE CHONDROITIN TRIPLE) TABS Take 1 tablet by mouth 2 (two) times daily.    [provider]  Multiple Vitamin (MULTI-VITAMINS) TABS Take 1 tablet by mouth daily.     [provider]  Multiple Vitamins-Minerals (PRESERVISION AREDS 2) CAPS Take 1 capsule by mouth 2 (two) times daily.    [provider]  tadalafil (CIALIS) 5 MG tablet Take 1 tablet (5 mg total) by mouth at bedtime. 09/18/21   Stoioff, Verna Czech, MD  tamsulosin (FLOMAX) 0.4 MG CAPS capsule Take 1 capsule (0.4 mg total) by mouth daily. 09/18/21   Stoioff, Verna Czech, MD  Testosterone 20.25 MG/ACT (1.62%) GEL 1 pump applied to each shoulder daily 09/18/21   Stoioff, Verna Czech, MD  Turmeric 500 MG CAPS Take 500 mg by mouth 2 (two) times daily.     [provider]  verapamil (CALAN-SR) 240 MG CR tablet Take 1 tablet (240 mg total) by mouth 2 (two) times daily. 01/07/22   Simmons-Robinson, Tawanna Cooler, MD    Family  History Family History  Problem Relation Age of Onset   Cancer Mother        ovarian   Glaucoma Mother    Cancer Father        prostate   Hypertension Sister    Asthma Sister    Alcohol abuse Paternal Uncle    Liver disease Paternal Uncle    Cancer - Prostate Paternal Uncle    Cancer Maternal Uncle        lung; two mat uncles   Cancer Paternal Grandmother        breast   Cancer Paternal Aunt        lung cancer in a smoker    Social History Social History   Tobacco Use   Smoking status: Never   Smokeless  tobacco: Never   Tobacco comments:    Exposed to second hand smoke as a child  Substance Use Topics   Alcohol use: Yes    Alcohol/week: 0.0 standard drinks of alcohol    Comment: occasionally   Drug use: No     Allergies   Hydralazine, Gluten meal, Moxifloxacin, Pseudoephedrine, Sulfa antibiotics, and Sulfonamide derivatives   Review of Systems Review of Systems  Constitutional:  Positive for fever.  Respiratory:  Positive for cough and shortness of breath.      Physical Exam Triage Vital Signs ED Triage Vitals [02/01/22 0853]  Enc Vitals Group     BP 132/75     Pulse Rate (!) 102     Resp 18     Temp (!) 102.2 F (39 C)     Temp Source Oral     SpO2 92 %     Weight      Height      Head Circumference      Peak Flow      Pain Score 0     Pain Loc      Pain Edu?      Excl. in GC?    No data found.  Updated Vital Signs BP 132/75 (BP Location: Left Arm)   Pulse (!) 102   Temp (!) 102.2 F (39 C) (Oral)   Resp 18   SpO2 92%   Visual Acuity Right Eye Distance:   Left Eye Distance:   Bilateral Distance:    Right Eye Near:   Left Eye Near:    Bilateral Near:     Physical Exam Vitals reviewed.  Constitutional:      Appearance: He is well-developed. He is ill-appearing.  Pulmonary:     Breath sounds: Examination of the right-lower field reveals rales. Examination of the left-lower field reveals rales. Rales present.  Skin:     General: Skin is warm and dry.  Neurological:     General: No focal deficit present.     Mental Status: He is alert and oriented to person, place, and time.  Psychiatric:        Mood and Affect: Mood normal.        Behavior: Behavior normal.      UC Treatments / Results  Labs (all labs ordered are listed, but only abnormal results are displayed) Labs Reviewed - No data to display  EKG   Radiology No results found.  Procedures Procedures (including critical care time)  Medications Ordered in UC Medications - No data to display  Initial Impression / Assessment and Plan / UC Course  I have reviewed the triage vital signs and the nursing notes.  Pertinent labs & imaging results that were available during my care of the patient were reviewed by me and considered in my medical decision making (see chart for details).   Patient is febrile here with last antipyretics (acetaminophen) approx 7 hours ago. Gave ibuprofen 400 mg PO in clinic.  Satting 92% on room air. Overall is very ill appearing, with severe cough. Pulmonary exam somewhat limited d/t patient inability to breath deeply without triggering severe cough. Exam is remarkable for crackles heard in the lower lobes bases.  Given marginal O2 sats, fever and possible PNA, recommended patient be discharged to ED for evaluation including imaging. Patient acknowledged this plan and plans to drive himself.   Final Clinical Impressions(s) / UC Diagnoses   Final diagnoses:  None   Discharge Instructions   None  ED Prescriptions   None    PDMP not reviewed this encounter.   Charma Igo, [place] 02/01/22 [phone]

## 2022-02-01 NOTE — ED Notes (Signed)
Patient is being discharged from the Urgent Care and sent to the Emergency Department via POV . Per Jeannett Senior Immordino, patient is in need of higher level of care due to Providers evaluation. Patient is aware and verbalizes understanding of plan of care.  Vitals:   02/01/22 0853  BP: 132/75  Pulse: (!) 102  Resp: 18  Temp: (!) 102.2 F (39 C)  SpO2: 92%

## 2022-02-01 NOTE — Discharge Instructions (Signed)
Take antibiotics as prescribed.  Take acetaminophen 650 mg and ibuprofen 400 mg every 6 hours for pain.  Take with food.  Use your inhaler 2 puffs every 4-6 hours as needed for shortness of breath and wheezing.  Thank you for choosing [place] for your health care today!  Please see your primary doctor this week for a follow up appointment.   If you do not have a primary doctor call the following clinics to establish care:  If you have insurance:  [facility] [phone] [address] [place] [place] [address]   [facility]  [phone] [address] [place] [place] [address]   If you do not have insurance:  [facility]  [phone] [address] [place] [address]  Sometimes, in the early stages of certain disease courses it is difficult to detect in the emergency department evaluation -- so, it is important that you continue to monitor your symptoms and call your doctor right away or return to the emergency department if you develop any new or worsening symptoms.  It was my pleasure to care for you today.   Daneil Dan Modesto Charon, MD

## 2022-02-01 NOTE — Discharge Instructions (Addendum)
Proceed to ED for evaluation of your symptoms.

## 2022-02-01 NOTE — ED Provider Triage Note (Signed)
Emergency Medicine Provider Triage Evaluation Note  [name] [name] , a 66 y.o. male  was evaluated in triage.  Pt complains of cough,chills, short of breath.  Seen at UC this am and told ? Pneumonia and to come to ED.   Test for covid and flu were negative 3 days ago.   Review of Systems  Positive: + sob, cough, weak.  Negative: No vomiting or diarrhea  Non-smoker  Physical Exam  BP (!) 153/65   Pulse 100   Temp 98.4 F (36.9 C)   Resp 16   Ht 6' (1.829 m)   Wt 95.3 kg   SpO2 94%   BMI 28.48 kg/m  Gen:   Awake, no distress   cough frequently  Resp:  Normal effort  + rales with cough MSK:   Moves extremities without difficulty  Other:    Medical Decision Making  Medically screening exam initiated at 9:41 AM.  Appropriate orders placed.  TAHJAI SCHETTER was informed that the remainder of the evaluation will be completed by another provider, this initial triage assessment does not replace that evaluation, and the importance of remaining in the ED until their evaluation is complete.     Tommi Rumps, PA-C 02/01/22 [phone]

## 2022-02-01 NOTE — ED Provider Notes (Signed)
[facility] Provider Note    Event Date/Time   First MD Initiated Contact with Patient 02/01/22 1035     (approximate)   History   Shortness of Breath and Cough   HPI  [name] [name] is a 66 y.o. male   Past medical history of asthma and hypertension who presents to the emergency department with 3 days of of cough with sputum production and fever.  Had testing at the clinic negative for COVID and flu.  Denies chest pain.  No abdominal pain nausea vomiting or diarrhea.  No urinary symptoms.  History was obtained via the patient.  I also reviewed urgent care note from earlier today for respiratory infectious symptoms where he was advised to come to the emergency department for further evaluation.      Physical Exam   Triage Vital Signs: ED Triage Vitals  Enc Vitals Group     BP 02/01/22 0941 (!) 153/65     Pulse Rate 02/01/22 0941 100     Resp 02/01/22 0941 16     Temp 02/01/22 0941 98.4 F (36.9 C)     Temp src --      SpO2 02/01/22 0941 94 %     Weight 02/01/22 0941 210 lb (95.3 kg)     Height 02/01/22 0941 6' (1.829 m)     Head Circumference --      Peak Flow --      Pain Score 02/01/22 0946 6     Pain Loc --      Pain Edu? --      Excl. in GC? --     Most recent vital signs: Vitals:   02/01/22 0941  BP: (!) 153/65  Pulse: 100  Resp: 16  Temp: 98.4 F (36.9 C)  SpO2: 94%    General: Awake, no distress.  CV:  Good peripheral perfusion.  Resp:  Normal effort.  Rhonchi bilaterally.  No wheezing. Abd:  No distention.  Other:  Nontoxic-appearing, alert and oriented and pleasant.  Hemodynamics reviewed significant only for mild hypertension, no hypoxemia and no fever.  Mild tachycardia 100.   ED Results / Procedures / Treatments   Labs (all labs ordered are listed, but only abnormal results are displayed) Labs Reviewed  COMPREHENSIVE METABOLIC PANEL - Abnormal; Notable for the following components:      Result Value    Potassium 3.4 (*)    Glucose, Bld 103 (*)    Calcium 8.4 (*)    Total Protein 6.1 (*)    Albumin 3.3 (*)    Total Bilirubin 1.3 (*)    All other components within normal limits  CBC WITH DIFFERENTIAL/PLATELET - Abnormal; Notable for the following components:   WBC 12.3 (*)    RBC 4.05 (*)    Hemoglobin 12.7 (*)    HCT 37.7 (*)    Neutro Abs 10.9 (*)    Lymphs Abs 0.6 (*)    All other components within normal limits     I reviewed labs and they are notable for leukocytosis with white blood cell count 12.3.  Hemoglobin is 12.7  EKG  ED ECG REPORT I, Pilar Jarvis, the attending physician, personally viewed and interpreted this ECG.   Date: 02/01/2022  EKG Time: 0951  Rate: 98  Rhythm: normal sinus rhythm  Axis: nl  Intervals:none  ST&T Change: No acute ischemic changes.    RADIOLOGY I independently reviewed and interpreted chest x-ray and see some opacities in the left lower ;  pbe   PROCEDURES:  Critical Care performed: No  Procedures   MEDICATIONS ORDERED IN ED: Medications  amoxicillin-clavulanate (AUGMENTIN) 875-125 MG per tablet 1 tablet (has no administration in time range)  azithromycin (ZITHROMAX) tablet 500 mg (has no administration in time range)    IMPRESSION / MDM / ASSESSMENT AND PLAN / ED COURSE  I reviewed the triage vital signs and the nursing notes.                              Differential diagnosis includes, but is not limited to, respiratory infection, bacterial pneumonia, viral URI, ACS, PE    MDM: Patient with upper respiratory infectious symptoms, lower respiratory exam findings with rhonchi and chest x-ray with opacities concerning for pneumonia.  No other significant risk factors no recent hospitalizations or ventilator, patient appears nontoxic with normal hemodynamics and no hypoxemia in the emergency department.  He will be given antibiotics and I considered admission/observation but since the patient is well-appearing and fully  functional with activities of daily living at home and lives with wife, he is opting for outpatient treatment and I think this is reasonable.   Patient's presentation is most consistent with acute presentation with potential threat to life or bodily function.       FINAL CLINICAL IMPRESSION(S) / ED DIAGNOSES   Final diagnoses:  Community acquired pneumonia of left lower lobe of lung     Rx / DC Orders   ED Discharge Orders          Ordered    azithromycin (ZITHROMAX Z-PAK) 250 MG tablet        02/01/22 1123    amoxicillin-clavulanate (AUGMENTIN) 875-125 MG tablet  2 times daily        02/01/22 1123             Note:  This document was prepared using Dragon voice recognition software and may include unintentional dictation errors.    Pilar Jarvis, MD 02/01/22 [id number]

## 2022-02-01 NOTE — ED Triage Notes (Signed)
Pt to ED for cough, shob, fever, chills since Wednesday.  P[roductive cough noted in triage.

## 2022-02-05 ENCOUNTER — Ambulatory Visit (INDEPENDENT_AMBULATORY_CARE_PROVIDER_SITE_OTHER): Payer: Medicare Other | Admitting: Family Medicine

## 2022-02-05 ENCOUNTER — Encounter: Payer: Self-pay | Admitting: Family Medicine

## 2022-02-05 VITALS — BP 135/70 | HR 83 | Temp 98.4°F | Resp 18 | Wt 203.0 lb

## 2022-02-05 DIAGNOSIS — J189 Pneumonia, unspecified organism: Secondary | ICD-10-CM

## 2022-02-05 MED ORDER — BENZONATATE 200 MG PO CAPS: 200.0000 mg | ORAL_CAPSULE | Freq: Two times a day (BID) | ORAL | 0 refills | Status: DC | PRN

## 2022-02-05 NOTE — Progress Notes (Signed)
   SUBJECTIVE:   CHIEF COMPLAINT / HPI:   ER FOLLOW UP Hospital/facility: [facility] 11/25 Diagnosis: CAP Procedures/tests:  - CXR L>RLL opacities coordinating with LL rhonchi heard on exam - previous negative COVID/RSV/flu at [facility] Consultants:  New medications:  - augmentin, Zpack Discharge instructions:   Status: slightly better - cough slightly better, hurts to cough - still on antibiotics, finishes tomorrow.  - no SOB, though hard to take full breath.    OBJECTIVE:   BP 135/70 (BP Location: Right Arm, Patient Position: Sitting, Cuff Size: Large)   Pulse 83   Temp 98.4 F (36.9 C) (Oral)   Resp 18   Wt 203 lb (92.1 kg)   SpO2 95%   BMI 27.53 kg/m   Gen: well appearing, in NAD Card: RRR Lungs: very faint b/l rhonchi in lower lobes Ext: WWP, no edema   ASSESSMENT/PLAN:   CAP Improved, finishing abx tomorrow. No current need to extend antibiotics given improving symptoms. Also with h/o chronic cough, asthma, GERD which may also complicate picture and contribute to prolonged cough as well as postinfectious. Rx tessalon provided today. Recommend mucinex. RTC for new or worsened symptoms.    Caro Laroche, DO

## 2022-02-06 ENCOUNTER — Ambulatory Visit: Payer: Self-pay | Admitting: Internal Medicine

## 2022-02-07 ENCOUNTER — Other Ambulatory Visit: Payer: Self-pay | Admitting: Family Medicine

## 2022-02-07 ENCOUNTER — Ambulatory Visit: Payer: Self-pay

## 2022-02-07 DIAGNOSIS — J4550 Severe persistent asthma, uncomplicated: Secondary | ICD-10-CM

## 2022-02-07 MED ORDER — ALBUTEROL SULFATE HFA 108 (90 BASE) MCG/ACT IN AERS: 1.0000 | INHALATION_SPRAY | Freq: Four times a day (QID) | RESPIRATORY_TRACT | 1 refills | Status: DC | PRN

## 2022-02-07 NOTE — Telephone Encounter (Signed)
Medication Refill - Medication:  albuterol (VENTOLIN HFA) 108 (90 Base) MCG/ACT inhaler   Has the patient contacted their pharmacy? No.  Preferred Pharmacy (with phone number or street name):  [facility] [address] [facility] - [place], [place] - [address] AT [address] Phone: [phone]  Fax: [phone]     Has the patient been seen for an appointment in the last year OR does the patient have an upcoming appointment? Yes.    Agent: Please be advised that RX refills may take up to 3 business days. We ask that you follow-up with your pharmacy.

## 2022-02-07 NOTE — Telephone Encounter (Signed)
Requested Prescriptions  Pending Prescriptions Disp Refills   albuterol (VENTOLIN HFA) 108 (90 Base) MCG/ACT inhaler 18 g 0    Sig: Inhale 1-2 puffs into the lungs every 6 (six) hours as needed for wheezing or shortness of breath.     Pulmonology:  [facility] 2 Passed - 02/07/2022  4:48 PM      Passed - Last BP in normal range    BP Readings from Last 1 Encounters:  02/05/22 135/70         Passed - Last Heart Rate in normal range    Pulse Readings from Last 1 Encounters:  02/05/22 83         Passed - Valid encounter within last 12 months    Recent Outpatient Visits           2 days ago Community acquired pneumonia, unspecified laterality   [facility] [place], [place], DO   1 month ago Refractory chronic cough   [facility] Simmons-Robinson, [place], MD   1 month ago [employer] preventive visit   [employer], [place], MD   2 months ago Walking pneumonia   [facility] Maple Hudson., MD   4 months ago Moderate persistent asthma with exacerbation   [facility] Jacky Kindle, FNP       Future Appointments             In 2 weeks Simmons-Robinson, Tawanna Cooler, MD [facility]   In 7 months Stoioff, Verna Czech, MD [facility]

## 2022-02-07 NOTE — Telephone Encounter (Signed)
  Chief Complaint: Wheezing - pneumonia Symptoms: Wheezing Frequency: 01/29/2022 Pertinent Negatives: Patient denies Worsening s/s Disposition: [] ED /[] Urgent Care (no appt availability in office) / [] Appointment(In office/virtual)/ []  [facility]/ [] Home Care/ [] Refused Recommended Disposition /[] [facility]/ [x]  Follow-up with PCP Additional Notes: Pt was diagnosed with pneumonia and given 2 antibiotics. Pt has finished those. PT was told to use albuterol inhaler for help with breathing. Pt called because his inhaler is empty. Pt states that breathing is the same or better. PT just requests refill of inhaler.   Reason for Disposition  [1] MILD longstanding difficulty breathing AND [2]  SAME as normal  Answer Assessment - Initial Assessment Questions 1. RESPIRATORY STATUS: "Describe your breathing?" (e.g., wheezing, shortness of breath, unable to speak, severe coughing)      Wheezing. 2. ONSET: "When did this breathing problem begin?"      01/29/2022 3. PATTERN "Does the difficult breathing come and go, or has it been constant since it started?"       4. SEVERITY: "How bad is your breathing?" (e.g., mild, moderate, severe)    - MILD: No SOB at rest, mild SOB with walking, speaks normally in sentences, can lie down, no retractions, pulse < 100.    - MODERATE: SOB at rest, SOB with minimal exertion and prefers to sit, cannot lie down flat, speaks in phrases, mild retractions, audible wheezing, pulse 100-120.    - SEVERE: Very SOB at rest, speaks in single words, struggling to breathe, sitting hunched forward, retractions, pulse > 120       5. RECURRENT SYMPTOM: "Have you had difficulty breathing before?" If Yes, ask: "When was the last time?" and "What happened that time?"      yes 6. CARDIAC HISTORY: "Do you have any history of heart disease?" (e.g., heart attack, angina, bypass surgery, angioplasty)       7. LUNG HISTORY: "Do you have any history of lung disease?"   (e.g., pulmonary embolus, asthma, emphysema)     asthma 8. CAUSE: "What do you think is causing the breathing problem?"      Pneumonia 9. OTHER SYMPTOMS: "Do you have any other symptoms? (e.g., dizziness, runny nose, cough, chest pain, fever)      10. O2 SATURATION MONITOR:  "Do you use an oxygen saturation monitor (pulse oximeter) at home?" If Yes, ask: "What is your reading (oxygen level) today?" "What is your usual oxygen saturation reading?" (e.g., 95%)        11. PREGNANCY: "Is there any chance you are pregnant?" "When was your last menstrual period?"        12. TRAVEL: "Have you traveled out of the country in the last month?" (e.g., travel history, exposures)  Protocols used: Breathing Difficulty-A-AH

## 2022-02-10 LAB — FUNGUS CULTURE WITH STAIN

## 2022-02-10 LAB — FUNGUS CULTURE RESULT

## 2022-02-10 LAB — FUNGAL ORGANISM REFLEX

## 2022-02-10 MED ORDER — ALBUTEROL SULFATE HFA 108 (90 BASE) MCG/ACT IN AERS: 1.0000 | INHALATION_SPRAY | Freq: Four times a day (QID) | RESPIRATORY_TRACT | 3 refills | Status: DC | PRN

## 2022-02-10 MED ORDER — FLUTICASONE-SALMETEROL 500-50 MCG/ACT IN AEPB: 1.0000 | INHALATION_SPRAY | Freq: Two times a day (BID) | RESPIRATORY_TRACT | 2 refills | Status: DC

## 2022-02-10 NOTE — Addendum Note (Signed)
Addended by: Bing Neighbors on: 02/10/2022 04:59 PM   Modules accepted: Orders

## 2022-02-10 NOTE — Telephone Encounter (Signed)
Refills for inhalers sent to pharmacy    [name], MD  [facility]

## 2022-02-25 ENCOUNTER — Telehealth: Payer: Self-pay | Admitting: Family Medicine

## 2022-02-25 NOTE — Telephone Encounter (Signed)
LVM for pt to call back to reschedule appt .Provider is off this week

## 2022-02-26 ENCOUNTER — Ambulatory Visit: Payer: Medicare Other | Admitting: Family Medicine

## 2022-02-26 ENCOUNTER — Ambulatory Visit (INDEPENDENT_AMBULATORY_CARE_PROVIDER_SITE_OTHER): Payer: Medicare Other | Admitting: Family Medicine

## 2022-02-26 ENCOUNTER — Encounter: Payer: Self-pay | Admitting: Family Medicine

## 2022-02-26 VITALS — BP 140/78 | HR 82 | Resp 16 | Wt 198.6 lb

## 2022-02-26 DIAGNOSIS — R053 Chronic cough: Secondary | ICD-10-CM | POA: Diagnosis not present

## 2022-02-26 DIAGNOSIS — G9339 Other post infection and related fatigue syndromes: Secondary | ICD-10-CM | POA: Insufficient documentation

## 2022-02-26 MED ORDER — CHERATUSSIN AC 100-10 MG/5ML PO SOLN: 5.0000 mL | Freq: Four times a day (QID) | ORAL | 0 refills | Status: DC | PRN

## 2022-02-26 NOTE — Assessment & Plan Note (Signed)
Given recent illness, age >74 and chronic cough; I suspect this is related to recovery from illness however given weight loss and persistent decline in energy levels we discussed recommendations for continued oral hydration and obtaining blood work today to include CBC, CMP, TSH and Vitamin B12 levels  Of note, he has lost 12 pounds since his visit 02/01/22 Will plan to follow up in 3 weeks

## 2022-02-26 NOTE — Patient Instructions (Signed)
We will collect blood work today to test for other causes related to decreased energy.   I have also prescribed more cough medicines    Please continue to consume plenty of fluids throughout the day and rest as much as your schedule allows

## 2022-02-26 NOTE — Progress Notes (Signed)
I,[name],acting as a scribe for [employer], MD.,have documented all relevant documentation on the behalf of [name], MD,as directed by  [name], MD while in the presence of [name], MD.   Established patient visit   Patient: [name] [name]   DOB: [birth year]   66 y.o. Male  MRN: [id number] Visit Date: 02/26/2022  Today's healthcare provider: [name], MD   Chief Complaint  Patient presents with   Follow-up   Subjective    HPI   Follow up, Chronic Cough  Patient presents today for 2 month follow up for chronic cough  He has recently been evaluated in the ED for SOB and prescribed abx that he completed on Wednesday after the ED visit 11/26 and the other antibiotic the next day 11/27. He was evaluated by his pulmonologist and prescribed fluticasone-salmeterol 250-50 two puffs twice daily with  flonase.   He was last seen at [facility] for ED follow up and has now completed the prescribed Zpak and Augmentin  Overall, cough is improved  He reports having some improved symptoms with cough syrup and tessalon perles regimen at bedtime  States that he has not noticed any improvement in his symptoms since the inhaler regimen has been changed   Fatigue  Today he reports still feels tired. 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   benzonatate (TESSALON) 200 MG capsule Take 1 capsule (200 mg total) by mouth 2 (two) times daily as needed for cough.   Calcium Carb-Cholecalciferol (CALCIUM 600+D) 600-800 MG-UNIT TABS Take 1 tablet by mouth daily.   DEXILANT 60 MG capsule TAKE ONE CAPSULE BY MOUTH ONE TIME DAILY   dextromethorphan-guaiFENesin (MUCINEX DM) 30-600 MG 12hr tablet Take 1 tablet by mouth 2 (two) times daily as needed for cough.   dutasteride (AVODART) 0.5 MG capsule Take 1 capsule (0.5 mg total) by mouth daily.   famotidine (PEPCID) 40 MG tablet TAKE ONE TABLET BY MOUTH DAILY (Patient taking differently: Take 40 mg by mouth at bedtime.)   fluticasone (FLONASE) 50 MCG/ACT nasal spray Place 1 spray into both nostrils daily.   fluticasone-salmeterol (ADVAIR DISKUS) 500-50 MCG/ACT AEPB Inhale 1 puff into the lungs in the morning and at bedtime.   hydrochlorothiazide (HYDRODIURIL) 25 MG tablet Take 1 tablet (25 mg total) by mouth daily.   ketotifen (ZADITOR) 0.025 % ophthalmic  solution Place 1 drop into both eyes daily.    losartan (COZAAR) 100 MG tablet TAKE ONE TABLET BY MOUTH ONE TIME DAILY   Misc Natural Products (GLUCOSAMINE CHONDROITIN TRIPLE) TABS Take 1 tablet by mouth 2 (two) times daily.   Multiple Vitamin (MULTI-VITAMINS) TABS Take 1 tablet by mouth daily.    Multiple Vitamins-Minerals (PRESERVISION AREDS 2) CAPS Take 1 capsule by mouth 2 (two) times daily.   tadalafil (CIALIS) 5 MG tablet Take 1 tablet (5 mg  total) by mouth at bedtime.   tamsulosin (FLOMAX) 0.4 MG CAPS capsule Take 1 capsule (0.4 mg total) by mouth daily.   Testosterone 20.25 MG/ACT (1.62%) GEL 1 pump applied to each shoulder daily   Turmeric 500 MG CAPS Take 500 mg by mouth 2 (two) times daily.    verapamil (CALAN-SR) 240 MG CR tablet Take 1 tablet (240 mg total) by mouth 2 (two) times daily.   [DISCONTINUED] guaiFENesin-codeine (CHERATUSSIN AC) 100-10 MG/5ML syrup Take 5 mLs by mouth 4 (four) times daily as needed for cough.   No facility-administered medications prior to visit.    Review of Systems     Objective    BP (!) 140/78 (BP Location: Right Arm, Patient Position: Sitting, Cuff Size: Large)   Pulse 82   Resp 16   Wt 198 lb 9.6 oz (90.1 kg)   SpO2 98%   BMI 26.94 kg/m    Physical Exam Constitutional:      General: He is not in acute distress.    Appearance: Normal appearance. He is not ill-appearing, toxic-appearing or diaphoretic.     Interventions: He is not intubated. HENT:     Right Ear: No drainage or tenderness. A middle ear effusion is present. Tympanic membrane is not erythematous.     Left Ear: No drainage or tenderness. A middle ear effusion is present. Tympanic membrane is not erythematous.     Mouth/Throat:     Mouth: Mucous membranes are moist.     Pharynx: No oropharyngeal exudate or posterior oropharyngeal erythema.  Cardiovascular:     Rate and Rhythm: Normal rate and regular rhythm.  Pulmonary:     Effort: Pulmonary effort is normal. No tachypnea, accessory muscle usage or respiratory distress. He is not intubated.     Breath sounds: No stridor or decreased air movement. Examination of the right-middle field reveals rhonchi. Examination of the left-middle field reveals rhonchi. Examination of the right-lower field reveals rhonchi. Examination of the left-lower field reveals rhonchi. Rhonchi present. No decreased breath sounds, wheezing or rales.     Comments: Dry cough intermittently  throughout exam  Neurological:     Mental Status: He is alert.      No results found for any visits on 02/26/22.  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      I, [name], MD, have reviewed all documentation for this visit.  Portions of this information were initially documented by the CMA and reviewed by me for thoroughness and accuracy.      [name], MD  [facility] [phone] (phone) [phone] (fax)  [facility]

## 2022-02-26 NOTE — Assessment & Plan Note (Signed)
Chronic, improved  Followed by pulm  Continue current regimen of inhalers

## 2022-02-27 LAB — COMPREHENSIVE METABOLIC PANEL
ALT: 9 IU/L (ref 0–44)
AST: 16 IU/L (ref 0–40)
Albumin/Globulin Ratio: 1.6 (ref 1.2–2.2)
Albumin: 3.7 g/dL — ABNORMAL LOW (ref 3.9–4.9)
Alkaline Phosphatase: 69 IU/L (ref 44–121)
BUN/Creatinine Ratio: 12 (ref 10–24)
BUN: 10 mg/dL (ref 8–27)
Bilirubin Total: 0.4 mg/dL (ref 0.0–1.2)
CO2: 28 mmol/L (ref 20–29)
Calcium: 9 mg/dL (ref 8.6–10.2)
Chloride: 102 mmol/L (ref 96–106)
Creatinine, Ser: 0.86 mg/dL (ref 0.76–1.27)
Globulin, Total: 2.3 g/dL (ref 1.5–4.5)
Glucose: 105 mg/dL — ABNORMAL HIGH (ref 70–99)
Potassium: 3.8 mmol/L (ref 3.5–5.2)
Sodium: 142 mmol/L (ref 134–144)
Total Protein: 6 g/dL (ref 6.0–8.5)
eGFR: 95 mL/min/{1.73_m2} (ref >59)

## 2022-02-27 LAB — CBC
Hematocrit: 37.3 % — ABNORMAL LOW (ref 37.5–51.0)
Hemoglobin: 12.1 g/dL — ABNORMAL LOW (ref 13.0–17.7)
MCH: 30.2 pg (ref 26.6–33.0)
MCHC: 32.4 g/dL (ref 31.5–35.7)
MCV: 93 fL (ref 79–97)
Platelets: 237 10*3/uL (ref 150–450)
RBC: 4.01 x10E6/uL — ABNORMAL LOW (ref 4.14–5.80)
RDW: 13 % (ref 11.6–15.4)
WBC: 5.8 10*3/uL (ref 3.4–10.8)

## 2022-02-27 LAB — TSH+FREE T4
Free T4: 1.3 ng/dL (ref 0.82–1.77)
TSH: 0.844 u[IU]/mL (ref 0.450–4.500)

## 2022-02-27 LAB — VITAMIN B12: Vitamin B-12: 639 pg/mL (ref 232–1245)

## 2022-03-04 LAB — ACID FAST CULTURE WITH REFLEXED SENSITIVITIES (MYCOBACTERIA): Acid Fast Culture: NEGATIVE

## 2022-03-15 ENCOUNTER — Other Ambulatory Visit: Payer: Self-pay | Admitting: Urology

## 2022-03-19 NOTE — Progress Notes (Signed)
I,[name],acting as a scribe for [employer], MD.,have documented all relevant documentation on the behalf of [name], MD,as directed by  [name], MD while in the presence of [name], MD.   Established patient visit   Patient: [name] [name]   DOB: 09-07-1955   67 y.o. Male  MRN: [id number] Visit Date: 03/20/2022  Today's healthcare provider: [name], MD   Chief Complaint  Patient presents with   Follow-up Fatigue   Subjective    HPI  Follow up for fatigue  The patient was last seen for this 3 weeks ago. Changes made at last visit include none. Patient reports that he is feeling a lot better. Had COVID booster Dec 18 2021    Chronic GERD Patient requesting external referral to cardiothoracic surgeon, Dr. Maryjane Hurter for repair for paraesophageal hernia   Medications: Outpatient Medications Prior to Visit  Medication Sig   albuterol (VENTOLIN HFA) 108 (90 Base) MCG/ACT inhaler Inhale 1-2 puffs into the lungs every 6 (six) hours as needed for wheezing or shortness of breath.   Calcium Carb-Cholecalciferol (CALCIUM 600+D) 600-800 MG-UNIT TABS Take 1 tablet by mouth daily.   DEXILANT 60 MG capsule TAKE ONE CAPSULE BY MOUTH ONE TIME DAILY   dutasteride (AVODART) 0.5 MG capsule Take 1 capsule (0.5 mg total) by mouth daily.   famotidine (PEPCID) 40 MG tablet TAKE ONE TABLET BY MOUTH DAILY (Patient taking differently: Take 40 mg by mouth at bedtime.)   fluticasone (FLONASE) 50 MCG/ACT nasal spray Place 1 spray into both nostrils daily.   fluticasone-salmeterol (ADVAIR DISKUS) 500-50 MCG/ACT AEPB Inhale 1 puff into the lungs in the morning and at bedtime.   hydrochlorothiazide (HYDRODIURIL) 25 MG tablet Take 1 tablet (25 mg total) by mouth daily.   ketotifen (ZADITOR) 0.025 % ophthalmic solution Place 1 drop into both eyes daily.    losartan (COZAAR) 100 MG tablet TAKE ONE TABLET BY MOUTH  ONE TIME DAILY   Misc Natural Products (GLUCOSAMINE CHONDROITIN TRIPLE) TABS Take 1 tablet by mouth 2 (two) times daily.   Multiple Vitamin (MULTI-VITAMINS) TABS Take 1 tablet by mouth daily.    Multiple Vitamins-Minerals (PRESERVISION AREDS 2) CAPS Take 1 capsule by mouth 2 (two) times daily.   tadalafil (CIALIS) 5 MG tablet Take 1 tablet (5 mg total) by mouth at bedtime.   tamsulosin (FLOMAX) 0.4 MG CAPS capsule Take 1 capsule (0.4 mg total) by mouth daily.   Testosterone 1.62 % GEL APPLY ONE PUMP TOPICALLY TO EACH SHOULDER DAILY   Turmeric 500 MG CAPS Take 500 mg by mouth 2 (two) times daily.    verapamil (CALAN-SR) 240 MG CR tablet Take 1 tablet (240 mg total) by mouth 2 (two) times daily.   [DISCONTINUED] benzonatate (TESSALON) 200 MG capsule Take 1 capsule (200 mg total) by mouth 2 (two) times daily as needed for cough.   [DISCONTINUED] dextromethorphan-guaiFENesin (MUCINEX DM) 30-600 MG 12hr tablet Take 1 tablet by mouth 2 (two) times daily as needed for cough.   [DISCONTINUED] guaiFENesin-codeine (CHERATUSSIN AC) 100-10 MG/5ML syrup Take 5 mLs by mouth 4 (four) times daily as needed for cough.   No facility-administered medications prior to visit.    Review of Systems     Objective    BP 138/70 (BP Location: Left Arm, Patient Position: Sitting, Cuff Size: Normal)   Pulse 72   Temp 97.9 F (36.6 C) (Oral)   Resp 16   Wt 202 lb 12.8 oz (92 kg)   SpO2  98%   BMI 27.50 kg/m    Physical Exam Vitals reviewed.  Constitutional:      General: He is not in acute distress.    Appearance: Normal appearance. He is not ill-appearing, toxic-appearing or diaphoretic.  Eyes:     Conjunctiva/sclera: Conjunctivae normal.  Cardiovascular:     Rate and Rhythm: Normal rate and regular rhythm.     Pulses: Normal pulses.     Heart sounds: Normal heart sounds. No murmur heard.    No friction rub. No gallop.  Pulmonary:     Effort: Pulmonary effort is normal. No respiratory distress.      Breath sounds: No stridor. Rhonchi present. No wheezing or rales.  Abdominal:     General: Bowel sounds are normal. There is no distension.     Palpations: Abdomen is soft.     Tenderness: There is no abdominal tenderness.  Musculoskeletal:     Right lower leg: No edema.     Left lower leg: No edema.  Skin:    Findings: No erythema or rash.  Neurological:     Mental Status: He is alert and oriented to person, place, and time.     No results found for any visits on 03/20/22.  Assessment & Plan     Problem List Items Addressed This Visit       Respiratory   Paraesophageal hernia     Chronic GERD symptoms  Has been evaluated by Dr. Maryjane Hurter, hoping to schedule surgery for later this year  Referral submitted       Relevant Orders   Ambulatory referral to Cardiothoracic Surgery     Digestive   ACID REFLUX DISEASE - Primary    Referral for paraesophageal hernia repair submitted       Relevant Orders   Ambulatory referral to Cardiothoracic Surgery   Other Visit Diagnoses     Need for pneumococcal 20-valent conjugate vaccination       Relevant Orders   Pneumococcal conjugate vaccine 20-valent (Prevnar 20) (Completed)      Fatigue is improved, resolved   Return in about 6 months (around 09/18/2022) for chronic conditions .      I, [name], MD, have reviewed all documentation for this visit.  Portions of this information were initially documented by the CMA and reviewed by me for thoroughness and accuracy.      [name], MD  [facility] [phone] (phone) [phone] (fax)  [place]

## 2022-03-20 ENCOUNTER — Ambulatory Visit (INDEPENDENT_AMBULATORY_CARE_PROVIDER_SITE_OTHER): Payer: PPO | Admitting: Family Medicine

## 2022-03-20 ENCOUNTER — Encounter: Payer: Self-pay | Admitting: Family Medicine

## 2022-03-20 VITALS — BP 138/70 | HR 72 | Temp 97.9°F | Resp 16 | Wt 202.8 lb

## 2022-03-20 DIAGNOSIS — K449 Diaphragmatic hernia without obstruction or gangrene: Secondary | ICD-10-CM | POA: Insufficient documentation

## 2022-03-20 DIAGNOSIS — Z23 Encounter for immunization: Secondary | ICD-10-CM

## 2022-03-20 DIAGNOSIS — K219 Gastro-esophageal reflux disease without esophagitis: Secondary | ICD-10-CM | POA: Diagnosis not present

## 2022-03-21 ENCOUNTER — Encounter: Payer: Self-pay | Admitting: Urology

## 2022-03-21 ENCOUNTER — Other Ambulatory Visit: Payer: Medicare Other

## 2022-03-21 NOTE — Assessment & Plan Note (Signed)
Chronic GERD symptoms  Has been evaluated by Dr. Maryjane Hurter, hoping to schedule surgery for later this year  Referral submitted

## 2022-03-21 NOTE — Assessment & Plan Note (Signed)
Referral for paraesophageal hernia repair submitted

## 2022-03-27 DIAGNOSIS — L820 Inflamed seborrheic keratosis: Secondary | ICD-10-CM | POA: Diagnosis not present

## 2022-03-27 DIAGNOSIS — D225 Melanocytic nevi of trunk: Secondary | ICD-10-CM | POA: Diagnosis not present

## 2022-03-27 DIAGNOSIS — D2262 Melanocytic nevi of left upper limb, including shoulder: Secondary | ICD-10-CM | POA: Diagnosis not present

## 2022-03-27 DIAGNOSIS — L570 Actinic keratosis: Secondary | ICD-10-CM | POA: Diagnosis not present

## 2022-03-27 DIAGNOSIS — D2272 Melanocytic nevi of left lower limb, including hip: Secondary | ICD-10-CM | POA: Diagnosis not present

## 2022-03-27 DIAGNOSIS — L298 Other pruritus: Secondary | ICD-10-CM | POA: Diagnosis not present

## 2022-03-27 DIAGNOSIS — Z85828 Personal history of other malignant neoplasm of skin: Secondary | ICD-10-CM | POA: Diagnosis not present

## 2022-03-27 DIAGNOSIS — L538 Other specified erythematous conditions: Secondary | ICD-10-CM | POA: Diagnosis not present

## 2022-04-03 ENCOUNTER — Ambulatory Visit (INDEPENDENT_AMBULATORY_CARE_PROVIDER_SITE_OTHER): Payer: PPO | Admitting: Internal Medicine

## 2022-04-03 ENCOUNTER — Ambulatory Visit (HOSPITAL_BASED_OUTPATIENT_CLINIC_OR_DEPARTMENT_OTHER): Payer: PPO

## 2022-04-03 ENCOUNTER — Ambulatory Visit
Admission: RE | Admit: 2022-04-03 | Discharge: 2022-04-03 | Disposition: A | Discharge status: Home / Self Care | Payer: PPO | Source: Ambulatory Visit | Attending: Internal Medicine | Admitting: Internal Medicine

## 2022-04-03 ENCOUNTER — Ambulatory Visit
Admission: RE | Admit: 2022-04-03 | Discharge: 2022-04-03 | Disposition: A | Discharge status: Home / Self Care | Payer: PPO | Attending: Internal Medicine | Admitting: Internal Medicine

## 2022-04-03 ENCOUNTER — Encounter: Payer: Self-pay | Admitting: Internal Medicine

## 2022-04-03 VITALS — BP 158/78 | HR 77 | Temp 97.9°F | Resp 16 | Ht 72.0 in | Wt 203.8 lb

## 2022-04-03 DIAGNOSIS — J189 Pneumonia, unspecified organism: Secondary | ICD-10-CM | POA: Diagnosis not present

## 2022-04-03 DIAGNOSIS — J4550 Severe persistent asthma, uncomplicated: Secondary | ICD-10-CM

## 2022-04-03 DIAGNOSIS — J181 Lobar pneumonia, unspecified organism: Secondary | ICD-10-CM

## 2022-04-03 DIAGNOSIS — R053 Chronic cough: Secondary | ICD-10-CM

## 2022-04-03 DIAGNOSIS — G4733 Obstructive sleep apnea (adult) (pediatric): Secondary | ICD-10-CM

## 2022-04-03 DIAGNOSIS — J4520 Mild intermittent asthma, uncomplicated: Secondary | ICD-10-CM

## 2022-04-03 DIAGNOSIS — R0602 Shortness of breath: Secondary | ICD-10-CM

## 2022-04-03 LAB — PULMONARY FUNCTION TEST ARMC ONLY
DL/VA % pred: 107 %
DL/VA: 4.39 ml/min/mmHg/L
DLCO unc % pred: 127 %
DLCO unc: 35.99 ml/min/mmHg
FEF 25-75 Post: 6.18 L/sec
FEF 25-75 Pre: 6.04 L/sec
FEF2575-%Change-Post: 2 %
FEF2575-%Pred-Post: 215 %
FEF2575-%Pred-Pre: 211 %
FEV1-%Change-Post: 3 %
FEV1-%Pred-Post: 129 %
FEV1-%Pred-Pre: 126 %
FEV1-Post: 4.75 L
FEV1-Pre: 4.61 L
FEV1FVC-%Change-Post: 0 %
FEV1FVC-%Pred-Pre: 120 %
FEV6-%Change-Post: 2 %
FEV6-%Pred-Post: 112 %
FEV6-%Pred-Pre: 109 %
FEV6-Post: 5.27 L
FEV6-Pre: 5.13 L
FEV6FVC-%Pred-Post: 105 %
FEV6FVC-%Pred-Pre: 105 %
FVC-%Change-Post: 2 %
FVC-%Pred-Post: 107 %
FVC-%Pred-Pre: 104 %
FVC-Post: 5.27 L
FVC-Pre: 5.13 L
Post FEV1/FVC ratio: 90 %
Post FEV6/FVC ratio: 100 %
Pre FEV1/FVC ratio: 90 %
Pre FEV6/FVC Ratio: 100 %
RV % pred: 78 %
RV: 1.94 L
TLC % pred: 91 %
TLC: 6.8 L

## 2022-04-03 MED ORDER — ALBUTEROL SULFATE (2.5 MG/3ML) 0.083% IN NEBU
2.5000 mg | INHALATION_SOLUTION | Freq: Once | RESPIRATORY_TRACT | Status: AC
  Administered 2022-04-03: 2.5 mg via RESPIRATORY_TRACT

## 2022-04-03 NOTE — Patient Instructions (Signed)

## 2022-04-03 NOTE — Progress Notes (Signed)
[facility] [address] [place], [place] [address]  Pulmonary Sleep Medicine   Office Visit Note  Patient Name: [name] [name] DOB: May 28, 1955 MRN [id number]  Date of Service: 04/03/2022  Complaints/HPI: He states that he had pneumonia around thanksgiving. States he lost 22 pounds and has been slowly gaining it back. He continues to use his CPAP as prescribed. Patient states he has no issues with the device. Supplies are up to date. He did not have a follow up cxr so will get that done at this time  ROS  General: (-) fever, (-) chills, (-) night sweats, (-) weakness Skin: (-) rashes, (-) itching,. Eyes: (-) visual changes, (-) redness, (-) itching. Nose and Sinuses: (-) nasal stuffiness or itchiness, (-) postnasal drip, (-) nosebleeds, (-) sinus trouble. Mouth and Throat: (-) sore throat, (-) hoarseness. Neck: (-) swollen glands, (-) enlarged thyroid, (-) neck pain. Respiratory: - cough, (-) bloody sputum, - shortness of breath, - wheezing. Cardiovascular: - ankle swelling, (-) chest pain. Lymphatic: (-) lymph node enlargement. Neurologic: (-) numbness, (-) tingling. Psychiatric: (-) anxiety, (-) depression   Current Medication: Outpatient Encounter Medications as of 04/03/2022  Medication Sig   albuterol (VENTOLIN HFA) 108 (90 Base) MCG/ACT inhaler Inhale 1-2 puffs into the lungs every 6 (six) hours as needed for wheezing or shortness of breath.   Calcium Carb-Cholecalciferol (CALCIUM 600+D) 600-800 MG-UNIT TABS Take 1 tablet by mouth daily.   DEXILANT 60 MG capsule TAKE ONE CAPSULE BY MOUTH ONE TIME DAILY   dutasteride (AVODART) 0.5 MG capsule Take 1 capsule (0.5 mg total) by mouth daily.   famotidine (PEPCID) 40 MG tablet TAKE ONE TABLET BY MOUTH DAILY (Patient taking differently: Take 40 mg by mouth at bedtime.)   fluticasone (FLONASE) 50 MCG/ACT nasal spray Place 1 spray into both nostrils daily.   fluticasone-salmeterol (ADVAIR DISKUS) 500-50 MCG/ACT  AEPB Inhale 1 puff into the lungs in the morning and at bedtime.   hydrochlorothiazide (HYDRODIURIL) 25 MG tablet Take 1 tablet (25 mg total) by mouth daily.   ketotifen (ZADITOR) 0.025 % ophthalmic solution Place 1 drop into both eyes daily.    losartan (COZAAR) 100 MG tablet TAKE ONE TABLET BY MOUTH ONE TIME DAILY   Misc Natural Products (GLUCOSAMINE CHONDROITIN TRIPLE) TABS Take 1 tablet by mouth 2 (two) times daily.   Multiple Vitamin (MULTI-VITAMINS) TABS Take 1 tablet by mouth daily.    Multiple Vitamins-Minerals (PRESERVISION AREDS 2) CAPS Take 1 capsule by mouth 2 (two) times daily.   tadalafil (CIALIS) 5 MG tablet Take 1 tablet (5 mg total) by mouth at bedtime.   tamsulosin (FLOMAX) 0.4 MG CAPS capsule Take 1 capsule (0.4 mg total) by mouth daily.   Testosterone 1.62 % GEL APPLY ONE PUMP TOPICALLY TO EACH SHOULDER DAILY   Turmeric 500 MG CAPS Take 500 mg by mouth 2 (two) times daily.    verapamil (CALAN-SR) 240 MG CR tablet Take 1 tablet (240 mg total) by mouth 2 (two) times daily.   No facility-administered encounter medications on file as of 04/03/2022.    Surgical History: Past Surgical History:  Procedure Laterality Date   APPENDECTOMY  1990   Dr Lemar Livings   COLONOSCOPY  2008   Dr Lemar Livings   COLONOSCOPY WITH PROPOFOL N/A 08/13/2016   Procedure: COLONOSCOPY WITH PROPOFOL;  Surgeon: Earline Mayotte, MD;  Location: [facility] ENDOSCOPY;  Service: Endoscopy;  Laterality: N/A;   HEMORRHOID SURGERY     HERNIA REPAIR  01/20/14   umbilical hernia  KNEE ARTHROSCOPY Left    KNEE SURGERY Left 1986   NASAL SEPTUM SURGERY     TONSILLECTOMY     TOTAL KNEE ARTHROPLASTY Left 03/29/2019   Procedure: TOTAL KNEE ARTHROPLASTY;  Surgeon: Corky Mull, MD;  Location: [facility] ORS;  Service: Orthopedics;  Laterality: Left;   VASECTOMY      Medical History: Past Medical History:  Diagnosis Date   Allergy    Asthma    BP (high blood pressure) 11/07/2014   Should stop combination of Tekturna (DRI)   and ARB    Hypertension    HYPERTENSION, SEVERE 03/15/2007   Qualifier: Diagnosis of  By: Tilden Dome     Prostate enlargement    Sleep apnea     Family History: Family History  Problem Relation Age of Onset   Cancer Mother        ovarian   Glaucoma Mother    Cancer Father        prostate   Hypertension Sister    Asthma Sister    Alcohol abuse Paternal Uncle    Liver disease Paternal Uncle    Cancer - Prostate Paternal Uncle    Cancer Maternal Uncle        lung; two mat uncles   Cancer Paternal Grandmother        breast   Cancer Paternal Aunt        lung cancer in a smoker    Social History: Social History   Socioeconomic History   Marital status: Married    Spouse name: Not on file   Number of children: Not on file   Years of education: Not on file   Highest education level: Not on file  Occupational History   Not on file  Tobacco Use   Smoking status: Never   Smokeless tobacco: Never   Tobacco comments:    Exposed to second hand smoke as a child  Substance and Sexual Activity   Alcohol use: Yes    Alcohol/week: 0.0 standard drinks of alcohol    Comment: occasionally   Drug use: No   Sexual activity: Not on file  Other Topics Concern   Not on file  Social History Narrative   Not on file   Social Determinants of Health   Financial Resource Strain: Not on file  Food Insecurity: Not on file  Transportation Needs: No Transportation Needs (11/08/2021)   PRAPARE - Hydrologist (Medical): No    Lack of Transportation (Non-Medical): No  Physical Activity: Not on file  Stress: Not on file  Social Connections: Not on file  Intimate Partner Violence: Not on file    Vital Signs: Blood pressure (!) 158/78, pulse 77, temperature 97.9 F (36.6 C), resp. rate 16, height 6' (1.829 m), weight 203 lb 12.8 oz (92.4 kg), SpO2 98 %.  Examination: General Appearance: The patient is well-developed, well-nourished, and in no  distress. Skin: Gross inspection of skin unremarkable. Head: normocephalic, no gross deformities. Eyes: no gross deformities noted. ENT: ears appear grossly normal no exudates. Neck: Supple. No thyromegaly. No LAD. Respiratory: No rhonchi no rales are noted. Cardiovascular: Normal S1 and S2 without murmur or rub. Extremities: No cyanosis. pulses are equal. Neurologic: Alert and oriented. No involuntary movements.  LABS: Recent Results (from the past 2160 hour(s))  Nitric oxide     Status: None   Collection Time: 01/10/22  2:21 PM  Result Value Ref Range   Nitric Oxide 41   Expectorated Sputum  Assessment w Gram Stain, Rflx to Resp Cult     Status: None   Collection Time: 01/15/22 10:15 AM   Specimen: Expectorated Sputum  Result Value Ref Range   Specimen Description EXPECTORATED SPUTUM    Special Requests NONE    Sputum evaluation      THIS SPECIMEN IS ACCEPTABLE FOR SPUTUM CULTURE Performed at [facility], [address] [place], [place] [address]    Report Status 01/15/2022 FINAL   Fungus Culture With Stain     Status: None   Collection Time: 01/15/22 10:15 AM  Result Value Ref Range   Fungus Stain DUPLICATE     Comment: (NOTE) Duplicate procedure ordered. Please refer to the following specimen for additional lab results.      [id number]    Fungus (Mycology) Culture DUPLICATE     Comment: (NOTE) Duplicate procedure ordered. Please refer to the following specimen for additional lab results.      [phone] Performed At: [facility] [address] [place], [place] [id number] Rush Farmer MD TO:[id number]    Fungal Source SPUTUM     Comment: Performed at [facility], [address] [place], [place] [address]  Culture, Respiratory w Gram Stain     Status: None   Collection Time: 01/15/22 10:15 AM  Result Value Ref Range   Specimen Description      EXPECTORATED SPUTUM Performed at [facility], [address]  [place], [place] [address]    Special Requests      NONE Reflexed from [id number] Performed at [facility], [place], [place] [address]    Gram Stain      ABUNDANT SQUAMOUS EPITHELIAL CELLS PRESENT ABUNDANT GRAM POSITIVE COCCI IN CHAINS IN CLUSTERS FEW GRAM NEGATIVE RODS RARE BUDDING YEAST SEEN    Culture      MODERATE Normal respiratory flora-no Staph aureus or Pseudomonas seen Performed at [facility], [place] [address], [place], [place] [address]    Report Status 01/18/2022 FINAL   Acid Fast Smear (AFB)     Status: None   Collection Time: 01/15/22 10:16 AM   Specimen: Sputum  Result Value Ref Range   AFB Specimen Processing Concentration    Acid Fast Smear Negative     Comment: (NOTE) Performed At: [facility] [address] [place], [place] [id number] Rush Farmer MD AS:[id number]    Source (AFB) EXPECTORATED SPUTUM     Comment: Performed at [facility], [address] [place], [place] [address]  Acid Fast Culture with reflexed sensitivities     Status: None   Collection Time: 01/15/22 10:16 AM   Specimen: Sputum  Result Value Ref Range   Acid Fast Culture Negative     Comment: (NOTE) No acid fast bacilli isolated after 6 weeks. Performed At: [facility] [place], [place] [id number] Rush Farmer MD KG:[id number]    Source of Sample EXPECTORATED SPUTUM     Comment: Performed at [facility], [address] [place], [place] [address]  Fungus Culture With Stain     Status: Abnormal   Collection Time: 01/15/22 10:16 AM  Result Value Ref Range   Fungus Stain Final report (A)    Fungus (Mycology) Culture Final report (A)     Comment: (NOTE) Performed At: [facility] [address] [place], [place] [id number] Rush Farmer MD AJ:[id number] CORRECTED ON 12/04 AT 1435: PREVIOUSLY REPORTED AS Preliminary report    Fungal Source PENDING   Fungus  Culture Result     Status: Abnormal    Collection Time: 01/15/22 10:16 AM  Result Value Ref Range   Result 1 Yeast observed (A)     Comment: (NOTE) Performed At: [facility] [address] [place], [place] [id number] Jolene Schimke MD SW:[id number]   Fungal organism reflex     Status: Abnormal   Collection Time: 01/15/22 10:16 AM  Result Value Ref Range   Fungal result 1 Candida albicans (A)     Comment: (NOTE) Scant growth Performed At: [facility] [address] [place], [place] [id number] Jolene Schimke MD WC:[id number]   Resp panel by RT-PCR (RSV, Flu A&B, Covid) Anterior Nasal Swab     Status: None   Collection Time: 01/29/22  7:32 PM   Specimen: Anterior Nasal Swab  Result Value Ref Range   SARS Coronavirus 2 by RT PCR NEGATIVE NEGATIVE    Comment: (NOTE) SARS-CoV-2 target nucleic acids are NOT DETECTED.  The SARS-CoV-2 RNA is generally detectable in upper respiratory specimens during the acute phase of infection. The lowest concentration of SARS-CoV-2 viral copies this assay can detect is 138 copies/mL. A negative result does not preclude SARS-Cov-2 infection and should not be used as the sole basis for treatment or other patient management decisions. A negative result may occur with  improper specimen collection/handling, submission of specimen other than nasopharyngeal swab, presence of viral mutation(s) within the areas targeted by this assay, and inadequate number of viral copies(<138 copies/mL). A negative result must be combined with clinical observations, patient history, and epidemiological information. The expected result is Negative.  Fact Sheet for Patients:  BloggerCourse.com  Fact Sheet for Healthcare Providers:  SeriousBroker.it  This test is no t yet approved or cleared by the [place] FDA and  has been authorized for detection and/or diagnosis of SARS-CoV-2 by FDA under an Emergency Use Authorization (EUA). This  EUA will remain  in effect (meaning this test can be used) for the duration of the COVID-19 declaration under Section 564(b)(1) of the Act, 21 U.S.C.section 360bbb-3(b)(1), unless the authorization is terminated  or revoked sooner.       Influenza A by PCR NEGATIVE NEGATIVE   Influenza B by PCR NEGATIVE NEGATIVE    Comment: (NOTE) The Xpert Xpress SARS-CoV-2/FLU/RSV plus assay is intended as an aid in the diagnosis of influenza from Nasopharyngeal swab specimens and should not be used as a sole basis for treatment. Nasal washings and aspirates are unacceptable for Xpert Xpress SARS-CoV-2/FLU/RSV testing.  Fact Sheet for Patients: BloggerCourse.com  Fact Sheet for Healthcare Providers: SeriousBroker.it  This test is not yet approved or cleared by the [place] FDA and has been authorized for detection and/or diagnosis of SARS-CoV-2 by FDA under an Emergency Use Authorization (EUA). This EUA will remain in effect (meaning this test can be used) for the duration of the COVID-19 declaration under Section 564(b)(1) of the Act, 21 U.S.C. section 360bbb-3(b)(1), unless the authorization is terminated or revoked.     Resp Syncytial Virus by PCR NEGATIVE NEGATIVE    Comment: (NOTE) Fact Sheet for Patients: BloggerCourse.com  Fact Sheet for Healthcare Providers: SeriousBroker.it  This test is not yet approved or cleared by the [place] FDA and has been authorized for detection and/or diagnosis of SARS-CoV-2 by FDA under an Emergency Use Authorization (EUA). This EUA will remain in effect (meaning this test can be used) for the duration of the COVID-19 declaration under Section 564(b)(1) of the Act, 21 U.S.C. section 360bbb-3(b)(1), unless the authorization  is terminated or revoked.  Performed at [facility], [address], [place], [place] [address]    Comprehensive metabolic panel     Status: Abnormal   Collection Time: 02/01/22  9:44 AM  Result Value Ref Range   Sodium 137 135 - 145 mmol/L   Potassium 3.4 (L) 3.5 - 5.1 mmol/L   Chloride 103 98 - 111 mmol/L   CO2 23 22 - 32 mmol/L   Glucose, Bld 103 (H) 70 - 99 mg/dL    Comment: Glucose reference range applies only to samples taken after fasting for at least 8 hours.   BUN 19 8 - 23 mg/dL   Creatinine, Ser 5.46 0.61 - 1.24 mg/dL   Calcium 8.4 (L) 8.9 - 10.3 mg/dL   Total Protein 6.1 (L) 6.5 - 8.1 g/dL   Albumin 3.3 (L) 3.5 - 5.0 g/dL   AST 17 15 - 41 U/L   ALT 16 0 - 44 U/L   Alkaline Phosphatase 59 38 - 126 U/L   Total Bilirubin 1.3 (H) 0.3 - 1.2 mg/dL   GFR, Estimated >27 >03 mL/min    Comment: (NOTE) Calculated using the CKD-EPI Creatinine Equation (2021)    Anion gap 11 5 - 15    Comment: Performed at [facility], [address] [place], [place] [address]  CBC with Differential     Status: Abnormal   Collection Time: 02/01/22  9:44 AM  Result Value Ref Range   WBC 12.3 (H) 4.0 - 10.5 K/uL   RBC 4.05 (L) 4.22 - 5.81 MIL/uL   Hemoglobin 12.7 (L) 13.0 - 17.0 g/dL   HCT 81.8 (L) 29.9 - 37.1 %   MCV 93.1 80.0 - 100.0 fL   MCH 31.4 26.0 - 34.0 pg   MCHC 33.7 30.0 - 36.0 g/dL   RDW 69.6 78.9 - 38.1 %   Platelets 201 150 - 400 K/uL   nRBC 0.0 0.0 - 0.2 %   Neutrophils Relative % 90 %   Neutro Abs 10.9 (H) 1.7 - 7.7 K/uL   Lymphocytes Relative 5 %   Lymphs Abs 0.6 (L) 0.7 - 4.0 K/uL   Monocytes Relative 5 %   Monocytes Absolute 0.7 0.1 - 1.0 K/uL   Eosinophils Relative 0 %   Eosinophils Absolute 0.0 0.0 - 0.5 K/uL   Basophils Relative 0 %   Basophils Absolute 0.1 0.0 - 0.1 K/uL   RBC Morphology MORPHOLOGY UNREMARKABLE    Smear Review MORPHOLOGY UNREMARKABLE    Immature Granulocytes 0 %   Abs Immature Granulocytes 0.05 0.00 - 0.07 K/uL    Comment: Performed at [facility], [address] [place], [place] [address]  TSH + free T4     Status:  None   Collection Time: 02/26/22 10:03 AM  Result Value Ref Range   TSH 0.844 0.450 - 4.500 uIU/mL   Free T4 1.30 0.82 - 1.77 ng/dL  CBC     Status: Abnormal   Collection Time: 02/26/22 10:03 AM  Result Value Ref Range   WBC 5.8 3.4 - 10.8 x10E3/uL   RBC 4.01 (L) 4.14 - 5.80 x10E6/uL   Hemoglobin 12.1 (L) 13.0 - 17.7 g/dL   Hematocrit 02.5 (L) 85.2 - 51.0 %   MCV 93 79 - 97 fL   MCH 30.2 26.6 - 33.0 pg   MCHC 32.4 31.5 - 35.7 g/dL   RDW 77.8 24.2 - 35.3 %   Platelets 237 150 - 450 x10E3/uL  Comprehensive metabolic panel  Status: Abnormal   Collection Time: 02/26/22 10:03 AM  Result Value Ref Range   Glucose 105 (H) 70 - 99 mg/dL   BUN 10 8 - 27 mg/dL   Creatinine, Ser 9.140.86 0.76 - 1.27 mg/dL   eGFR 95 >78>59 GN/FAO/1.30mL/min/1.73   BUN/Creatinine Ratio 12 10 - 24   Sodium 142 134 - 144 mmol/L   Potassium 3.8 3.5 - 5.2 mmol/L   Chloride 102 96 - 106 mmol/L   CO2 28 20 - 29 mmol/L   Calcium 9.0 8.6 - 10.2 mg/dL   Total Protein 6.0 6.0 - 8.5 g/dL   Albumin 3.7 (L) 3.9 - 4.9 g/dL   Globulin, Total 2.3 1.5 - 4.5 g/dL   Albumin/Globulin Ratio 1.6 1.2 - 2.2   Bilirubin Total 0.4 0.0 - 1.2 mg/dL   Alkaline Phosphatase 69 44 - 121 IU/L   AST 16 0 - 40 IU/L   ALT 9 0 - 44 IU/L  Vitamin B12     Status: None   Collection Time: 02/26/22 10:07 AM  Result Value Ref Range   Vitamin B-12 639 232 - 1,245 pg/mL    Radiology: DG Chest 2 View  Result Date: 02/01/2022 CLINICAL DATA:  Cough and chills with shortness of breath. EXAM: CHEST - 2 VIEW COMPARISON:  09/26/2021 FINDINGS: The cardiomediastinal silhouette is unremarkable. Patchy airspace and nodular opacities within both LOWER lungs are noted, LEFT-greater-than-RIGHT. There may be a trace LEFT pleural effusion present. There is no evidence of pneumothorax or RIGHT pleural effusion. No acute bony abnormalities are present. IMPRESSION: Patchy bilateral LOWER lung airspace and nodular opacities, LEFT-greater-than-RIGHT, suspicious for pneumonia.  Radiographic follow-up to resolution is recommended. Electronically Signed   By: Harmon PierJeffrey  Hu M.D.   On: 02/01/2022 10:37    No results found.  No results found.    Assessment and Plan: Patient Active Problem List   Diagnosis Date Noted   Paraesophageal hernia 03/20/2022   Other post infection and related fatigue syndromes 02/26/2022   Refractory chronic cough 01/02/2022   Essential hypertension 12/19/2021   Severe persistent asthma 09/26/2021   Erectile dysfunction due to arterial insufficiency 07/15/2018   Synovitis of elbow 08/17/2017   Thrombosed external hemorrhoid 05/10/2015   Allergic rhinitis 11/07/2014   Arthralgia of hand 11/07/2014   Benign prostatic hyperplasia with lower urinary tract symptoms 11/07/2014   Impotence of organic origin 11/07/2014   Acid reflux 11/07/2014   Hypogonadism in male 11/07/2014   Obstructive apnea 11/07/2014   Plantar fasciitis 11/07/2014   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 OSA (obstructive sleep apnea) On CPAP therapy and will continue with current pressure settings. Patient has had excellent compliance.  2. Mild intermittent asthma without complication Under control on inhalers will be continued  3. Lobar pneumonia, unspecified organism (HCC) Recent bout of pneumonia. Would suggest a follow up CXR to demonstrate complete resolution of the infiltrates   General Counseling: I have discussed the findings of the evaluation and examination with [name] Deerhristopher.  I have also discussed any further diagnostic evaluation thatmay be needed or ordered today. [name] [name] verbalizes understanding of the findings of todays visit. We also reviewed his medications today and discussed drug interactions and  side effects including but not limited excessive drowsiness and altered mental states. We  also discussed that there is always a risk not just to him but also people around him. he has been encouraged to call the office with any questions or concerns that should arise related to todays visit.  No orders of the defined types were placed in this encounter.    Time spent: 7745  I have personally obtained a history, examined the patient, evaluated laboratory and imaging results, formulated the assessment and plan and placed orders.    Yevonne PaxSaadat A Mishael Krysiak, MD [facility] Pulmonary and Critical Care Sleep medicine

## 2022-04-07 ENCOUNTER — Other Ambulatory Visit: Payer: Self-pay | Admitting: Family Medicine

## 2022-04-07 DIAGNOSIS — I10 Essential (primary) hypertension: Secondary | ICD-10-CM

## 2022-04-14 ENCOUNTER — Telehealth: Payer: Self-pay | Admitting: Family Medicine

## 2022-04-14 MED ORDER — HYDROCHLOROTHIAZIDE 25 MG PO TABS: 25.0000 mg | ORAL_TABLET | Freq: Every day | ORAL | 1 refills | Status: DC

## 2022-04-14 NOTE — Telephone Encounter (Signed)
Refilled

## 2022-04-14 NOTE — Telephone Encounter (Signed)
[facility] faxed refill request for the following medications:   hydrochlorothiazide (HYDRODIURIL) 25 MG tablet     Please advise

## 2022-04-15 DIAGNOSIS — Z79899 Other long term (current) drug therapy: Secondary | ICD-10-CM | POA: Diagnosis not present

## 2022-04-15 DIAGNOSIS — Z01818 Encounter for other preprocedural examination: Secondary | ICD-10-CM | POA: Diagnosis not present

## 2022-04-15 DIAGNOSIS — G4733 Obstructive sleep apnea (adult) (pediatric): Secondary | ICD-10-CM | POA: Diagnosis not present

## 2022-04-15 DIAGNOSIS — J4530 Mild persistent asthma, uncomplicated: Secondary | ICD-10-CM | POA: Diagnosis not present

## 2022-04-15 DIAGNOSIS — K219 Gastro-esophageal reflux disease without esophagitis: Secondary | ICD-10-CM | POA: Diagnosis not present

## 2022-04-15 DIAGNOSIS — K2100 Gastro-esophageal reflux disease with esophagitis, without bleeding: Secondary | ICD-10-CM | POA: Diagnosis not present

## 2022-04-15 DIAGNOSIS — K449 Diaphragmatic hernia without obstruction or gangrene: Secondary | ICD-10-CM | POA: Diagnosis not present

## 2022-04-15 DIAGNOSIS — N400 Enlarged prostate without lower urinary tract symptoms: Secondary | ICD-10-CM | POA: Diagnosis not present

## 2022-04-15 DIAGNOSIS — I10 Essential (primary) hypertension: Secondary | ICD-10-CM | POA: Diagnosis not present

## 2022-04-16 ENCOUNTER — Encounter: Payer: Self-pay | Admitting: Student in an Organized Health Care Education/Training Program

## 2022-04-16 ENCOUNTER — Ambulatory Visit (INDEPENDENT_AMBULATORY_CARE_PROVIDER_SITE_OTHER): Payer: PPO | Admitting: Student in an Organized Health Care Education/Training Program

## 2022-04-16 VITALS — BP 150/82 | HR 78 | Temp 97.7°F | Ht 72.0 in | Wt 203.2 lb

## 2022-04-16 DIAGNOSIS — J4520 Mild intermittent asthma, uncomplicated: Secondary | ICD-10-CM | POA: Diagnosis not present

## 2022-04-16 DIAGNOSIS — R053 Chronic cough: Secondary | ICD-10-CM

## 2022-04-16 MED ORDER — FLUTICASONE-SALMETEROL 250-50 MCG/ACT IN AEPB: 1.0000 | INHALATION_SPRAY | Freq: Two times a day (BID) | RESPIRATORY_TRACT | 11 refills | Status: DC

## 2022-04-16 NOTE — Progress Notes (Signed)
Synopsis: Follow up for asthma and cough  Assessment & Plan:   #Moderate persistent asthma #Chronic Cough   [name] [name] is presenting for follow up on chronic cough and asthma. His physical exam is benign today with no wheezing. His cough is resolved and his asthma is under excellent control  Workup has included a CBC (no eosinophilia, not present historically either), allergen testing (negative IgE, no allergens detected), and aspergillus testing (negative). PFT's show normal spirometry consistent with excellent control of asthma. Overall, his asthma is likely presenting as a Th1 phenotype. Given he is well controlled, I will lower his ICS dose to the Advair 250-50 one puff twice daily.   His chest CT overall does not show parenchymal abnormalities. It is notable for RLL tree in bud nodularity for which the differential includes atypical mycobacterial infections such as MAC (not TB), fungal infections, and recurrent aspiration. He did have a barium swallow test at [facility] (available in care everywhere) that is positive for severe reflux with documented aspiration. This is the most likely explanation behind his cough, RLL tree-in-bud, and difficult to control asthma. He was offered fundoplication at [facility] now scheduled for March.   - fluticasone-salmeterol (ADVAIR) 250-50 MCG/ACT AEPB; Inhale 1 puff into the lungs in the morning and at bedtime.  Dispense: 60 each; Refill: 11  Return in about 1 year (around 04/17/2023).  I spent 30 minutes caring for this patient today, including preparing to see the patient, obtaining a medical history , reviewing a separately obtained history, performing a medically appropriate examination and/or evaluation, counseling and educating the patient/family/caregiver, ordering medications, tests, or procedures, and documenting clinical information in the electronic health record  Raechel Chute, MD [facility] 04/16/2022 10:07 AM    End of visit  medications:  Meds ordered this encounter  Medications   fluticasone-salmeterol (ADVAIR) 250-50 MCG/ACT AEPB    Sig: Inhale 1 puff into the lungs in the morning and at bedtime.    Dispense:  60 each    Refill:  11     Current Outpatient Medications:    albuterol (VENTOLIN HFA) 108 (90 Base) MCG/ACT inhaler, Inhale 1-2 puffs into the lungs every 6 (six) hours as needed for wheezing or shortness of breath., Disp: 18 g, Rfl: 3   Calcium Carb-Cholecalciferol (CALCIUM 600+D) 600-800 MG-UNIT TABS, Take 1 tablet by mouth daily., Disp: , Rfl:    DEXILANT 60 MG capsule, TAKE ONE CAPSULE BY MOUTH ONE TIME DAILY, Disp: 90 capsule, Rfl: 2   dutasteride (AVODART) 0.5 MG capsule, Take 1 capsule (0.5 mg total) by mouth daily., Disp: 90 capsule, Rfl: 3   famotidine (PEPCID) 40 MG tablet, TAKE ONE TABLET BY MOUTH DAILY (Patient taking differently: Take 40 mg by mouth at bedtime.), Disp: 30 tablet, Rfl: 11   fluticasone (FLONASE) 50 MCG/ACT nasal spray, Place 1 spray into both nostrils daily., Disp: 18.2 mL, Rfl: 11   fluticasone-salmeterol (ADVAIR) 250-50 MCG/ACT AEPB, Inhale 1 puff into the lungs in the morning and at bedtime., Disp: 60 each, Rfl: 11   hydrochlorothiazide (HYDRODIURIL) 25 MG tablet, Take 1 tablet (25 mg total) by mouth daily., Disp: 90 tablet, Rfl: 1   ketotifen (ZADITOR) 0.025 % ophthalmic solution, Place 1 drop into both eyes daily. , Disp: , Rfl:    losartan (COZAAR) 100 MG tablet, TAKE ONE TABLET BY MOUTH ONE TIME DAILY, Disp: 90 tablet, Rfl: 1   Misc Natural Products (GLUCOSAMINE CHONDROITIN TRIPLE) TABS, Take 1 tablet by mouth 2 (two) times daily.,  Disp: , Rfl:    Multiple Vitamin (MULTI-VITAMINS) TABS, Take 1 tablet by mouth daily. , Disp: , Rfl:    Multiple Vitamins-Minerals (PRESERVISION AREDS 2) CAPS, Take 1 capsule by mouth 2 (two) times daily., Disp: , Rfl:    tadalafil (CIALIS) 5 MG tablet, Take 1 tablet (5 mg total) by mouth at bedtime., Disp: 90 tablet, Rfl: 3   tamsulosin  (FLOMAX) 0.4 MG CAPS capsule, Take 1 capsule (0.4 mg total) by mouth daily., Disp: 90 capsule, Rfl: 3   Testosterone 1.62 % GEL, APPLY ONE PUMP TOPICALLY TO EACH SHOULDER DAILY, Disp: 75 g, Rfl: 2   Turmeric 500 MG CAPS, Take 500 mg by mouth 2 (two) times daily. , Disp: , Rfl:    verapamil (CALAN-SR) 240 MG CR tablet, TAKE ONE TABLET BY MOUTH TWICE A DAY, Disp: 180 tablet, Rfl: 1   Subjective:   PATIENT ID: [name] [name] GENDER: male DOB: Jun 22, 1955, MRN: [id number]  Chief Complaint  Patient presents with   Follow-up    No current sx.     HPI  [name] [name] is a pleasant 67 year old male presenting to clinic for follow up on cough and shortness of breath.  He reports that his symptoms are all resolved. He feels much improved compared to prior. His cough and shortness of breath are much improved. He had pulmonary function testing recently that was fully normal. He was also seen by a thoracic surgeon and is scheduled for a fundoplication in March.   He was initially seen by me in September of 2023 for symptoms of cough productive of whitish (sometimes yellow) sputum, associated with shortness of breath, exertional dyspnea, and wheezing. His symptoms had been present for months. He tells me he was diagnosed with asthma many years ago by Dr. Donneta Romberg. He has had multiple exacerbations of his asthma, requiring antibiotics and steroids.  His exacerbations come about without associated URTI symptoms. He has not had any sick contacts. He does not have any pets and is a non-smoker (thou reports previous second hand exposure). He does not have any chest pain or tightness, nor does he have any fevers, chills, night sweats, or weight loss. He is compliant with his medications and is using Advair twice daily.   He tells me he has a history of asthma as well as cough, and had previously been seen at a [facility]. Work up at [facility] was initiated by Achilles Dunk, MD. At that time, he was referred for low  immunoglobulin levels. A high resolution CT chest in 2020 at [facility] suggested possible early bronchiectasis. He was referred to ENT and GI for further workup of sinusitis and reflux disease. Given concern for silent aspiration triggering his asthma, further workup was pursued. He was eventually referred to thoracic surgery after esophageal manometry showed a medium to large sized hiatal hernia. Barium swallow in January of 2023 didn't show a hiatal hernia but did show severe GERD with signs of aspiration. He was offered robot assisted laparoscopic repair of his paraesophageal hernia and fundoplication now scheduled for March of 2023.   He is a never smoker, but reports second hand exposure. He used to work at [employer] and retired recently. He continues to be active in his church. He reports significant mold exposure at [facility], and suspects there might be mold exposure at church. He does not have any pets.   Ancillary information including prior medications, full medical/surgical/family/social histories, and PFTs (when available) are listed below and have been reviewed.  Review of Systems  Constitutional:  Negative for chills and fever.  Respiratory:  Negative for cough, sputum production, shortness of breath and wheezing.   Cardiovascular:  Negative for chest pain.  Gastrointestinal:  Positive for heartburn.  Skin:  Negative for rash.     Objective:   Vitals:   04/16/22 0957  BP: (!) 150/82  Pulse: 78  Temp: 97.7 F (36.5 C)  TempSrc: Temporal  SpO2: 99%  Weight: 203 lb 3.2 oz (92.2 kg)  Height: 6' (1.829 m)   99% on RA  BMI Readings from Last 3 Encounters:  04/16/22 27.56 kg/m  04/03/22 27.64 kg/m  03/20/22 27.50 kg/m   Wt Readings from Last 3 Encounters:  04/16/22 203 lb 3.2 oz (92.2 kg)  04/03/22 203 lb 12.8 oz (92.4 kg)  03/20/22 202 lb 12.8 oz (92 kg)    Physical Exam Constitutional:      Appearance: Normal appearance. He is normal weight.  HENT:     Head:  Normocephalic.     Mouth/Throat:     Mouth: Mucous membranes are moist.  Cardiovascular:     Rate and Rhythm: Normal rate and regular rhythm.     Pulses: Normal pulses.     Heart sounds: Normal heart sounds.  Pulmonary:     Effort: Pulmonary effort is normal.     Breath sounds: No wheezing (diffuse scattered wheezes), rhonchi or rales.  Musculoskeletal:        General: Normal range of motion.     Cervical back: Normal range of motion and neck supple.  Skin:    General: Skin is warm.  Neurological:     General: No focal deficit present.     Mental Status: He is alert and oriented to person, place, and time.      Ancillary Information    Past Medical History:  Diagnosis Date   Allergy    Asthma    BP (high blood pressure) 11/07/2014   Should stop combination of Tekturna (DRI)  and ARB    Hypertension    HYPERTENSION, SEVERE 03/15/2007   Qualifier: Diagnosis of  By: Tilden Dome     Prostate enlargement    Sleep apnea      Family History  Problem Relation Age of Onset   Cancer Mother        ovarian   Glaucoma Mother    Cancer Father        prostate   Hypertension Sister    Asthma Sister    Alcohol abuse Paternal Uncle    Liver disease Paternal Uncle    Cancer - Prostate Paternal Uncle    Cancer Maternal Uncle        lung; two mat uncles   Cancer Paternal Grandmother        breast   Cancer Paternal Aunt        lung cancer in a smoker     Past Surgical History:  Procedure Laterality Date   APPENDECTOMY  1990   Dr Bary Castilla   COLONOSCOPY  2008   Dr Bary Castilla   COLONOSCOPY WITH PROPOFOL N/A 08/13/2016   Procedure: COLONOSCOPY WITH PROPOFOL;  Surgeon: Robert Bellow, MD;  Location: [facility] ENDOSCOPY;  Service: Endoscopy;  Laterality: N/A;   HEMORRHOID SURGERY     HERNIA REPAIR  [id number]   umbilical hernia   KNEE ARTHROSCOPY Left    KNEE SURGERY Left 1986   NASAL SEPTUM SURGERY     TONSILLECTOMY     TOTAL KNEE ARTHROPLASTY Left 03/29/2019  Procedure: TOTAL  KNEE ARTHROPLASTY;  Surgeon: Corky Mull, MD;  Location: [facility] ORS;  Service: Orthopedics;  Laterality: Left;   VASECTOMY      Social History   Socioeconomic History   Marital status: Married    Spouse name: Not on file   Number of children: Not on file   Years of education: Not on file   Highest education level: Not on file  Occupational History   Not on file  Tobacco Use   Smoking status: Never   Smokeless tobacco: Never   Tobacco comments:    Exposed to second hand smoke as a child  Substance and Sexual Activity   Alcohol use: Yes    Alcohol/week: 0.0 standard drinks of alcohol    [name]: occasionally   Drug use: No   Sexual activity: Not on file  Other Topics Concern   Not on file  Social History Narrative   Not on file   Social Determinants of Health   Financial Resource Strain: Not on file  Food Insecurity: Not on file  Transportation Needs: No Transportation Needs (11/08/2021)   PRAPARE - Hydrologist (Medical): No    Lack of Transportation (Non-Medical): No  Physical Activity: Not on file  Stress: Not on file  Social Connections: Not on file  Intimate Partner Violence: Not on file     Allergies  Allergen Reactions   Hydralazine Hives and Shortness Of Breath    Chest tightness, severe headache, dyspnea Other reaction(s): Chest Pain, Headache, Respiratory Distress   Gluten Meal Diarrhea    bloating   Moxifloxacin Hives    Avelox   Pseudoephedrine Other (See Comments)    Confusion, passed out, numbness   Sulfa Antibiotics Hives   Sulfonamide Derivatives Hives     CBC    Component Value Date/Time   WBC 5.8 02/26/2022 1003   WBC 12.3 (H) 02/01/2022 0944   RBC 4.01 (L) 02/26/2022 1003   RBC 4.05 (L) 02/01/2022 0944   HGB 12.1 (L) 02/26/2022 1003   HCT 37.3 (L) 02/26/2022 1003   PLT 237 02/26/2022 1003   MCV 93 02/26/2022 1003   MCH 30.2 02/26/2022 1003   MCH 31.4 02/01/2022 0944   MCHC 32.4 02/26/2022 1003   MCHC  33.7 02/01/2022 0944   RDW 13.0 02/26/2022 1003   LYMPHSABS 0.6 (L) 02/01/2022 0944   LYMPHSABS 2.2 11/20/2021 0850   MONOABS 0.7 02/01/2022 0944   EOSABS 0.0 02/01/2022 0944   EOSABS 0.1 11/20/2021 0850   BASOSABS 0.1 02/01/2022 0944   BASOSABS 0.1 11/20/2021 0850    Pulmonary Functions Testing Results:    Latest Ref Rng & Units 04/03/2022   11:30 AM  PFT Results  FVC-Pre L 5.13   FVC-Predicted Pre % 104   FVC-Post L 5.27   FVC-Predicted Post % 107   Pre FEV1/FVC % % 90   Post FEV1/FCV % % 90   FEV1-Pre L 4.61   FEV1-Predicted Pre % 126   FEV1-Post L 4.75   DLCO uncorrected ml/min/mmHg 35.99   DLCO UNC% % 127   DLVA Predicted % 107   TLC L 6.80   TLC % Predicted % 91   RV % Predicted % 78     Outpatient Medications Prior to Visit  Medication Sig Dispense Refill   albuterol (VENTOLIN HFA) 108 (90 Base) MCG/ACT inhaler Inhale 1-2 puffs into the lungs every 6 (six) hours as needed for wheezing or shortness of breath. 18 g 3  Calcium Carb-Cholecalciferol (CALCIUM 600+D) 600-800 MG-UNIT TABS Take 1 tablet by mouth daily.     DEXILANT 60 MG capsule TAKE ONE CAPSULE BY MOUTH ONE TIME DAILY 90 capsule 2   dutasteride (AVODART) 0.5 MG capsule Take 1 capsule (0.5 mg total) by mouth daily. 90 capsule 3   famotidine (PEPCID) 40 MG tablet TAKE ONE TABLET BY MOUTH DAILY (Patient taking differently: Take 40 mg by mouth at bedtime.) 30 tablet 11   fluticasone (FLONASE) 50 MCG/ACT nasal spray Place 1 spray into both nostrils daily. 18.2 mL 11   hydrochlorothiazide (HYDRODIURIL) 25 MG tablet Take 1 tablet (25 mg total) by mouth daily. 90 tablet 1   ketotifen (ZADITOR) 0.025 % ophthalmic solution Place 1 drop into both eyes daily.      losartan (COZAAR) 100 MG tablet TAKE ONE TABLET BY MOUTH ONE TIME DAILY 90 tablet 1   Misc Natural Products (GLUCOSAMINE CHONDROITIN TRIPLE) TABS Take 1 tablet by mouth 2 (two) times daily.     Multiple Vitamin (MULTI-VITAMINS) TABS Take 1 tablet by mouth  daily.      Multiple Vitamins-Minerals (PRESERVISION AREDS 2) CAPS Take 1 capsule by mouth 2 (two) times daily.     tadalafil (CIALIS) 5 MG tablet Take 1 tablet (5 mg total) by mouth at bedtime. 90 tablet 3   tamsulosin (FLOMAX) 0.4 MG CAPS capsule Take 1 capsule (0.4 mg total) by mouth daily. 90 capsule 3   Testosterone 1.62 % GEL APPLY ONE PUMP TOPICALLY TO EACH SHOULDER DAILY 75 g 2   Turmeric 500 MG CAPS Take 500 mg by mouth 2 (two) times daily.      verapamil (CALAN-SR) 240 MG CR tablet TAKE ONE TABLET BY MOUTH TWICE A DAY 180 tablet 1   fluticasone-salmeterol (ADVAIR DISKUS) 500-50 MCG/ACT AEPB Inhale 1 puff into the lungs in the morning and at bedtime. 60 each 2   No facility-administered medications prior to visit.

## 2022-04-17 ENCOUNTER — Ambulatory Visit (INDEPENDENT_AMBULATORY_CARE_PROVIDER_SITE_OTHER): Payer: HMO | Admitting: Physician Assistant

## 2022-04-17 ENCOUNTER — Encounter: Payer: Self-pay | Admitting: Physician Assistant

## 2022-04-17 VITALS — BP 156/75 | HR 72 | Ht 72.0 in | Wt 206.4 lb

## 2022-04-17 DIAGNOSIS — R229 Localized swelling, mass and lump, unspecified: Secondary | ICD-10-CM | POA: Diagnosis not present

## 2022-04-17 NOTE — Progress Notes (Signed)
I,[name] [name],acting as a Education administrator for [employer], PA-C.,have documented all relevant documentation on the behalf of [name] Kirschner, PA-C,as directed by  [name] Kirschner, PA-C while in the presence of [name] Kirschner, PA-C.   Established patient visit   Patient: [name] [name]   DOB: [birth year]   67 y.o. Male  MRN: [id number] Visit Date: 04/17/2022  Today's healthcare provider: Mikey Kirschner, PA-C  Cc. Painful lump right buttock  Subjective     Patient reports a painful lump on his right buttocks.  Reports it appeared yesterday.  Reports this happened twice before once 30 years ago and another time 15 years ago that it was a "blood clot" that needed surgical removal.  Reports that it is uncomfortable to sit.  Medications: Outpatient Medications Prior to Visit  Medication Sig   albuterol (VENTOLIN HFA) 108 (90 Base) MCG/ACT inhaler Inhale 1-2 puffs into the lungs every 6 (six) hours as needed for wheezing or shortness of breath.   Calcium Carb-Cholecalciferol (CALCIUM 600+D) 600-800 MG-UNIT TABS Take 1 tablet by mouth daily.   DEXILANT 60 MG capsule TAKE ONE CAPSULE BY MOUTH ONE TIME DAILY   dutasteride (AVODART) 0.5 MG capsule Take 1 capsule (0.5 mg total) by mouth daily.   famotidine (PEPCID) 40 MG tablet TAKE ONE TABLET BY MOUTH DAILY (Patient taking differently: Take 40 mg by mouth at bedtime.)   fluticasone (FLONASE) 50 MCG/ACT nasal spray Place 1 spray into both nostrils daily.   fluticasone-salmeterol (ADVAIR) 250-50 MCG/ACT AEPB Inhale 1 puff into the lungs in the morning and at bedtime.   hydrochlorothiazide (HYDRODIURIL) 25 MG tablet Take 1 tablet (25 mg total) by mouth daily.   ketotifen (ZADITOR) 0.025 % ophthalmic solution Place 1 drop into both eyes daily.    losartan (COZAAR) 100 MG tablet TAKE ONE TABLET BY MOUTH ONE TIME DAILY   Misc Natural Products (GLUCOSAMINE CHONDROITIN TRIPLE) TABS Take 1 tablet by mouth 2 (two) times daily.   Multiple Vitamin  (MULTI-VITAMINS) TABS Take 1 tablet by mouth daily.    Multiple Vitamins-Minerals (PRESERVISION AREDS 2) CAPS Take 1 capsule by mouth 2 (two) times daily.   tadalafil (CIALIS) 5 MG tablet Take 1 tablet (5 mg total) by mouth at bedtime.   tamsulosin (FLOMAX) 0.4 MG CAPS capsule Take 1 capsule (0.4 mg total) by mouth daily.   Testosterone 1.62 % GEL APPLY ONE PUMP TOPICALLY TO EACH SHOULDER DAILY   Turmeric 500 MG CAPS Take 500 mg by mouth 2 (two) times daily.    verapamil (CALAN-SR) 240 MG CR tablet TAKE ONE TABLET BY MOUTH TWICE A DAY   No facility-administered medications prior to visit.    Review of Systems  Skin:  Positive for rash.     Objective    BP (!) 156/75 (BP Location: Left Arm, Patient Position: Sitting, Cuff Size: Normal)   Pulse 72   Ht 6' (1.829 m)   Wt 206 lb 6.4 oz (93.6 kg)   SpO2 99%   BMI 27.99 kg/m   Physical Exam Skin:         Comments: There is a 1-2 cm raised firm lump on his right buttock with some associated erythema. It does appear this is in the site of a previous surgical removal     No results found for any visits on 04/17/22.  Assessment & Plan     Mass, right buttock Firm, some erythema but not fluctuant. Not consistent with an abscess. Lipoma? Unsure how it would be a blood  clot in that area . Recommended epsom salt soaks. Referred to surgery.  Return if symptoms worsen or fail to improve.      I, [name] Kirschner, PA-C have reviewed all documentation for this visit. The documentation on  04/18/22 for the exam, diagnosis, procedures, and orders are all accurate and complete.  [name] Kirschner, PA-C [facility] [address] [place], [place], [address] Office: [phone] Fax: [place]

## 2022-04-18 ENCOUNTER — Encounter: Payer: Self-pay | Admitting: Physician Assistant

## 2022-04-21 ENCOUNTER — Ambulatory Visit (INDEPENDENT_AMBULATORY_CARE_PROVIDER_SITE_OTHER): Payer: HMO | Admitting: Surgery

## 2022-04-21 ENCOUNTER — Encounter: Payer: Self-pay | Admitting: Surgery

## 2022-04-21 VITALS — BP 152/71 | HR 80 | Temp 98.7°F | Ht 72.0 in | Wt 205.0 lb

## 2022-04-21 DIAGNOSIS — L0233 Carbuncle of buttock: Secondary | ICD-10-CM

## 2022-04-21 MED ORDER — AMOXICILLIN-POT CLAVULANATE 875-125 MG PO TABS: 1.0000 | ORAL_TABLET | Freq: Two times a day (BID) | ORAL | 0 refills | Status: AC

## 2022-04-21 NOTE — Progress Notes (Signed)
Outpatient Surgical Follow Up  04/21/2022  [name] [name] is an 67 y.o. male.   Chief Complaint  Patient presents with   New Patient (Initial Visit)    Cyst buttock right of center    HPI: 67 year old male seen for right buttocks pain and a lump on that side.  He reports that he experiences intermittent pain on the right buttocks worsening when he sits.  No fevers no chills.  He feels a small knot.  Apparently she had had I&D of prior buttocks lesions and they were found to be clots.  He denies any fevers any chills.  He does have a history of GERD and hypertension.  He is scheduled to have Nissen fundoplication next month. Have a CT scan that I personally reviewed showing evidence for small sliding hiatal hernia. Did have a prior history of open appendectomy  Past Medical History:  Diagnosis Date   Allergy    Asthma    BP (high blood pressure) 11/07/2014   Should stop combination of Tekturna (DRI)  and ARB    Hypertension    HYPERTENSION, SEVERE 03/15/2007   Qualifier: Diagnosis of  By: Tilden Dome     Prostate enlargement    Sleep apnea     Past Surgical History:  Procedure Laterality Date   APPENDECTOMY  1990   Dr Bary Castilla   COLONOSCOPY  2008   Dr Bary Castilla   COLONOSCOPY WITH PROPOFOL N/A 08/13/2016   Procedure: COLONOSCOPY WITH PROPOFOL;  Surgeon: Robert Bellow, MD;  Location: [facility] ENDOSCOPY;  Service: Endoscopy;  Laterality: N/A;   HEMORRHOID SURGERY     HERNIA REPAIR  [id number]   umbilical hernia   KNEE ARTHROSCOPY Left    KNEE SURGERY Left 1986   NASAL SEPTUM SURGERY     TONSILLECTOMY     TOTAL KNEE ARTHROPLASTY Left 03/29/2019   Procedure: TOTAL KNEE ARTHROPLASTY;  Surgeon: Corky Mull, MD;  Location: [facility] ORS;  Service: Orthopedics;  Laterality: Left;   VASECTOMY      Family History  Problem Relation Age of Onset   Cancer Mother        ovarian   Glaucoma Mother    Cancer Father        prostate   Hypertension Sister    Asthma Sister    Alcohol  abuse Paternal Uncle    Liver disease Paternal Uncle    Cancer - Prostate Paternal Uncle    Cancer Maternal Uncle        lung; two mat uncles   Cancer Paternal Grandmother        breast   Cancer Paternal Aunt        lung cancer in a smoker    Social History:  reports that he has never smoked. He has never used smokeless tobacco. He reports current alcohol use. He reports that he does not use drugs.  Allergies:  Allergies  Allergen Reactions   Hydralazine Hives and Shortness Of Breath    Chest tightness, severe headache, dyspnea Other reaction(s): Chest Pain, Headache, Respiratory Distress   Gluten Meal Diarrhea    bloating   Moxifloxacin Hives    Avelox   Pseudoephedrine Other (See Comments)    Confusion, passed out, numbness   Sulfa Antibiotics Hives   Sulfonamide Derivatives Hives    Medications reviewed.    ROS Full ROS performed and is otherwise negative other than what is stated in HPI   BP (!) 152/71   Pulse 80   Temp 98.7 F (  37.1 C) (Oral)   Ht 6' (1.829 m)   Wt 205 lb (93 kg)   SpO2 96%   BMI 27.80 kg/m   Physical Exam Vitals and nursing note reviewed. Exam conducted with a chaperone present.  Constitutional:      General: He is not in acute distress.    Appearance: Normal appearance. He is not ill-appearing.  Cardiovascular:     Rate and Rhythm: Normal rate and regular rhythm.  Abdominal:     General: Abdomen is flat. There is no distension.     Palpations: Abdomen is soft. There is no mass.     Tenderness: There is no abdominal tenderness. There is no guarding or rebound.     Hernia: No hernia is present.  Genitourinary:    Rectum: Normal.     Comments: There is evidence of a small carbuncle on the right gluteal area.  No evidence of abscess mildly tender to palpation.  No evidence of necrotizing infection Musculoskeletal:        General: Normal range of motion.     Cervical back: Normal range of motion and neck supple. No rigidity or  tenderness.  Skin:    General: Skin is warm and dry.     Capillary Refill: Capillary refill takes less than 2 seconds.  Neurological:     General: No focal deficit present.     Mental Status: He is alert and oriented to person, place, and time.  Psychiatric:        Mood and Affect: Mood normal.        Behavior: Behavior normal.        Thought Content: Thought content normal.        Judgment: Judgment normal.     Assessment/Plan: 67 year old male with a carbuncle on the right buttocks.  We will try short course of antibiotic therapy.  No need for formal I&D.  No evidence of any rectal masses or any other pathology.  I will see him back in a couple weeks to make sure his exam is reassuring. Note that I spent 45 minutes in this encounter including personally reviewing imaging studies, coordinating his care and performing appropriate documentation   Caroleen Hamman, MD [place] Surgeon

## 2022-04-21 NOTE — Patient Instructions (Addendum)
Please pick up your prescription at your pharmacy.    If you have any concerns or questions, please feel free to call our office. See follow up appointment below.   Epidermoid Cyst  An epidermoid cyst, also called an epidermal cyst, is a small lump under your skin. The cyst contains a substance called keratin. Do not try to pop or open the cyst yourself. What are the causes? A blocked hair follicle. A hair that curls and re-enters the skin instead of growing straight out of the skin. A blocked pore. Irritated skin. An injury to the skin. Certain conditions that are passed along from parent to child. Human papillomavirus (HPV). This happens rarely when cysts occur on the bottom of the feet. Long-term sun damage to the skin. What increases the risk? Having acne. Being male. Having an injury to the skin. Being past puberty. Having certain conditions caused by genes (genetic disorder) What are the signs or symptoms? These cysts are usually harmless, but they can get infected. Symptoms of infection may include: Redness. Inflammation. Tenderness. Warmth. Fever. A bad-smelling substance that drains from the cyst. Pus that drains from the cyst. How is this treated? In many cases, epidermoid cysts go away on their own without treatment. If a cyst becomes infected, treatment may include: Opening and draining the cyst, done by a doctor. After draining, you may need minor surgery to remove the rest of the cyst. Antibiotic medicine. Shots of medicines (steroids) that help to reduce inflammation. Surgery to remove the cyst. Surgery may be done if the cyst: Becomes large. Bothers you. Has a chance of turning into cancer. Do not try to open a cyst yourself. Follow these instructions at home: Medicines Take over-the-counter and prescription medicines as told by your doctor. If you were prescribed an antibiotic medicine, take it as told by your doctor. Do not stop taking it even if you  start to feel better. General instructions Keep the area around your cyst clean and dry. Wear loose, dry clothing. Avoid touching your cyst. Check your cyst every day for signs of infection. Check for: Redness, swelling, or pain. Fluid or blood. Warmth. Pus or a bad smell. Keep all follow-up visits. How is this prevented? Wear clean, dry, clothing. Avoid wearing tight clothing. Keep your skin clean and dry. Take showers or baths every day. Contact a doctor if: Your cyst has symptoms of infection. Your condition does not improve or gets worse. You have a cyst that looks different from other cysts you have had. You have a fever. Get help right away if: Redness spreads from the cyst into the area close by. Summary An epidermoid cyst is a small lump under your skin. If a cyst becomes infected, treatment may include surgery to open and drain the cyst, or to remove it. Take over-the-counter and prescription medicines only as told by your doctor. Contact a doctor if your condition is not improving or is getting worse. Keep all follow-up visits. This information is not intended to replace advice given to you by your health care provider. Make sure you discuss any questions you have with your health care provider. Document Revised: 06/01/2019 Document Reviewed: 06/01/2019 Elsevier Patient Education  [place].

## 2022-05-02 ENCOUNTER — Telehealth: Payer: HMO | Admitting: Physician Assistant

## 2022-05-02 ENCOUNTER — Ambulatory Visit: Payer: Self-pay

## 2022-05-02 DIAGNOSIS — U071 COVID-19: Secondary | ICD-10-CM

## 2022-05-02 MED ORDER — NIRMATRELVIR/RITONAVIR (PAXLOVID)TABLET: 3.0000 | ORAL_TABLET | Freq: Two times a day (BID) | ORAL | 0 refills | Status: AC

## 2022-05-02 NOTE — Progress Notes (Signed)
Virtual Visit Consent   [name] [name], you are scheduled for a virtual visit with a [facility] provider today. Just as with appointments in the office, your consent must be obtained to participate. Your consent will be active for this visit and any virtual visit you may have with one of our providers in the next 365 days. If you have a MyChart account, a copy of this consent can be sent to you electronically.  As this is a virtual visit, video technology does not allow for your provider to perform a traditional examination. This may limit your provider's ability to fully assess your condition. If your provider identifies any concerns that need to be evaluated in person or the need to arrange testing (such as labs, EKG, etc.), we will make arrangements to do so. Although advances in technology are sophisticated, we cannot ensure that it will always work on either your end or our end. If the connection with a video visit is poor, the visit may have to be switched to a telephone visit. With either a video or telephone visit, we are not always able to ensure that we have a secure connection.  By engaging in this virtual visit, you consent to the provision of healthcare and authorize for your insurance to be billed (if applicable) for the services provided during this visit. Depending on your insurance coverage, you may receive a charge related to this service.  I need to obtain your verbal consent now. Are you willing to proceed with your visit today? [name] [name] has provided verbal consent on 05/02/2022 for a virtual visit (video or telephone). Lenise Arena Ward, PA-C  Date: 05/02/2022 6:22 PM  Virtual Visit via Video Note   I, Lenise Arena Ward, connected with  [name] [name]  ([id number], 1955/09/06) on 05/02/22 at  6:15 PM EST by a video-enabled telemedicine application and verified that I am speaking with the correct person using two identifiers.  Location: Patient: Virtual  Visit Location Patient: Home Provider: Virtual Visit Location Provider: Home Office   I discussed the limitations of evaluation and management by telemedicine and the availability of in person appointments. The patient expressed understanding and agreed to proceed.    History of Present Illness: [name] [name] is a 67 y.o. who identifies as a male who was assigned male at birth, and is being seen today for sore throat, cough, body aches, headache that started Wednesday.  Reports he tested positive for COVID on a home test today.  He reports his wife had similar sx last week, she tested negative for COVID. He has a h/o asthma.  He denies wheezing or shortness of breath.  He has had used in rescue inhaler.  He is currently taking advil and tylenol.  He does have tessalon on hand.   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   Refractory chronic cough 01/02/2022   Essential hypertension 12/19/2021   Severe persistent asthma 09/26/2021   Erectile dysfunction due to arterial insufficiency 07/15/2018   Synovitis of elbow 08/17/2017   Thrombosed external hemorrhoid 05/10/2015   Allergic rhinitis 11/07/2014   Arthralgia of hand 11/07/2014   Benign prostatic hyperplasia with lower urinary tract symptoms 11/07/2014   Impotence of organic origin 11/07/2014   Acid reflux 11/07/2014   Hypogonadism in male 11/07/2014   Obstructive apnea 11/07/2014   Plantar fasciitis 11/07/2014  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   Testicular hypofunction 10/26/2013   Pain in soft tissues of limb 10/26/2013   Palpitations 10/26/2013   Personal history of perinatal problems 10/26/2013   ACID REFLUX DISEASE 03/15/2007   COUGH, CHRONIC 03/15/2007    Allergies:  Allergies  Allergen Reactions   Hydralazine Hives and Shortness Of Breath    Chest  tightness, severe headache, dyspnea Other reaction(s): Chest Pain, Headache, Respiratory Distress   Gluten Meal Diarrhea    bloating   Moxifloxacin Hives    Avelox   Pseudoephedrine Other (See Comments)    Confusion, passed out, numbness   Sulfa Antibiotics Hives   Sulfonamide Derivatives Hives   Medications:  Current Outpatient Medications:    nirmatrelvir/ritonavir (PAXLOVID) 20 x 150 MG & 10 x '100MG'$  TABS, Take 3 tablets by mouth 2 (two) times daily for 5 days. (Take nirmatrelvir 150 mg two tablets twice daily for 5 days and ritonavir 100 mg one tablet twice daily for 5 days) Patient GFR is >60, Disp: 30 tablet, Rfl: 0   albuterol (VENTOLIN HFA) 108 (90 Base) MCG/ACT inhaler, Inhale 1-2 puffs into the lungs every 6 (six) hours as needed for wheezing or shortness of breath., Disp: 18 g, Rfl: 3   Calcium Carb-Cholecalciferol (CALCIUM 600+D) 600-800 MG-UNIT TABS, Take 1 tablet by mouth daily., Disp: , Rfl:    DEXILANT 60 MG capsule, TAKE ONE CAPSULE BY MOUTH ONE TIME DAILY, Disp: 90 capsule, Rfl: 2   dutasteride (AVODART) 0.5 MG capsule, Take 1 capsule (0.5 mg total) by mouth daily., Disp: 90 capsule, Rfl: 3   famotidine (PEPCID) 40 MG tablet, TAKE ONE TABLET BY MOUTH DAILY (Patient taking differently: Take 40 mg by mouth at bedtime.), Disp: 30 tablet, Rfl: 11   fluticasone (FLONASE) 50 MCG/ACT nasal spray, Place 1 spray into both nostrils daily., Disp: 18.2 mL, Rfl: 11   fluticasone-salmeterol (ADVAIR) 250-50 MCG/ACT AEPB, Inhale 1 puff into the lungs in the morning and at bedtime., Disp: 60 each, Rfl: 11   hydrochlorothiazide (HYDRODIURIL) 25 MG tablet, Take 1 tablet (25 mg total) by mouth daily., Disp: 90 tablet, Rfl: 1   ketotifen (ZADITOR) 0.025 % ophthalmic solution, Place 1 drop into both eyes daily. , Disp: , Rfl:    losartan (COZAAR) 100 MG tablet, TAKE ONE TABLET BY MOUTH ONE TIME DAILY, Disp: 90 tablet, Rfl: 1   Misc Natural Products (GLUCOSAMINE CHONDROITIN TRIPLE) TABS, Take 1  tablet by mouth 2 (two) times daily., Disp: , Rfl:    Multiple Vitamin (MULTI-VITAMINS) TABS, Take 1 tablet by mouth daily. , Disp: , Rfl:    Multiple Vitamins-Minerals (PRESERVISION AREDS 2) CAPS, Take 1 capsule by mouth 2 (two) times daily., Disp: , Rfl:    tadalafil (CIALIS) 5 MG tablet, Take 1 tablet (5 mg total) by mouth at bedtime., Disp: 90 tablet, Rfl: 3   tamsulosin (FLOMAX) 0.4 MG CAPS capsule, Take 1 capsule (0.4 mg total) by mouth daily., Disp: 90 capsule, Rfl: 3   Testosterone 1.62 % GEL, APPLY ONE PUMP TOPICALLY TO EACH SHOULDER DAILY, Disp: 75 g, Rfl: 2   Turmeric 500 MG CAPS, Take 500 mg by mouth 2 (two) times daily. , Disp: , Rfl:    verapamil (CALAN-SR) 240 MG CR tablet, TAKE ONE TABLET BY MOUTH TWICE A DAY, Disp: 180 tablet, Rfl: 1  Observations/Objective: Patient is well-developed, well-nourished in no acute distress.  Resting comfortably at home.  Head is normocephalic, atraumatic.  No labored breathing.  Speech is clear and  coherent with logical content.  Patient is alert and oriented at baseline.    Assessment and Plan: 1. COVID - nirmatrelvir/ritonavir (PAXLOVID) 20 x 150 MG & 10 x '100MG'$  TABS; Take 3 tablets by mouth 2 (two) times daily for 5 days. (Take nirmatrelvir 150 mg two tablets twice daily for 5 days and ritonavir 100 mg one tablet twice daily for 5 days) Patient GFR is >60  Dispense: 30 tablet; Refill: 0  Supportive care discussed.  In person evaluation precautions given. Pt in no acute distress, speaking in complete sentences.   Follow Up Instructions: I discussed the assessment and treatment plan with the patient. The patient was provided an opportunity to ask questions and all were answered. The patient agreed with the plan and demonstrated an understanding of the instructions.  A copy of instructions were sent to the patient via MyChart unless otherwise noted below.     The patient was advised to call back or seek an in-person evaluation if the  symptoms worsen or if the condition fails to improve as anticipated.  Time:  I spent 8 minutes with the patient via telehealth technology discussing the above problems/concerns.   Video connectivity problems.  Connected via phone for remainder of visit.   Lenise Arena Ward, PA-C

## 2022-05-02 NOTE — Patient Instructions (Signed)
[name] [name], thank you for joining [place], PA-C for today's virtual visit.  While this provider is not your primary care provider (PCP), if your PCP is located in our provider database this encounter information will be shared with them immediately following your visit.   [facility] account gives you access to today's visit and all your visits, tests, and labs performed at [facility] " click here if you don't have a [facility] account or go to [email or web address]  Consent: (Patient) [name] [name] provided verbal consent for this virtual visit at the beginning of the encounter.  Current Medications:  Current Outpatient Medications:    nirmatrelvir/ritonavir (PAXLOVID) 20 x 150 MG & 10 x '100MG'$  TABS, Take 3 tablets by mouth 2 (two) times daily for 5 days. (Take nirmatrelvir 150 mg two tablets twice daily for 5 days and ritonavir 100 mg one tablet twice daily for 5 days) Patient GFR is >60, Disp: 30 tablet, Rfl: 0   albuterol (VENTOLIN HFA) 108 (90 Base) MCG/ACT inhaler, Inhale 1-2 puffs into the lungs every 6 (six) hours as needed for wheezing or shortness of breath., Disp: 18 g, Rfl: 3   Calcium Carb-Cholecalciferol (CALCIUM 600+D) 600-800 MG-UNIT TABS, Take 1 tablet by mouth daily., Disp: , Rfl:    DEXILANT 60 MG capsule, TAKE ONE CAPSULE BY MOUTH ONE TIME DAILY, Disp: 90 capsule, Rfl: 2   dutasteride (AVODART) 0.5 MG capsule, Take 1 capsule (0.5 mg total) by mouth daily., Disp: 90 capsule, Rfl: 3   famotidine (PEPCID) 40 MG tablet, TAKE ONE TABLET BY MOUTH DAILY (Patient taking differently: Take 40 mg by mouth at bedtime.), Disp: 30 tablet, Rfl: 11   fluticasone (FLONASE) 50 MCG/ACT nasal spray, Place 1 spray into both nostrils daily., Disp: 18.2 mL, Rfl: 11   fluticasone-salmeterol (ADVAIR) 250-50 MCG/ACT AEPB, Inhale 1 puff into the lungs in the morning and at bedtime., Disp: 60 each, Rfl: 11   hydrochlorothiazide  (HYDRODIURIL) 25 MG tablet, Take 1 tablet (25 mg total) by mouth daily., Disp: 90 tablet, Rfl: 1   ketotifen (ZADITOR) 0.025 % ophthalmic solution, Place 1 drop into both eyes daily. , Disp: , Rfl:    losartan (COZAAR) 100 MG tablet, TAKE ONE TABLET BY MOUTH ONE TIME DAILY, Disp: 90 tablet, Rfl: 1   Misc Natural Products (GLUCOSAMINE CHONDROITIN TRIPLE) TABS, Take 1 tablet by mouth 2 (two) times daily., Disp: , Rfl:    Multiple Vitamin (MULTI-VITAMINS) TABS, Take 1 tablet by mouth daily. , Disp: , Rfl:    Multiple Vitamins-Minerals (PRESERVISION AREDS 2) CAPS, Take 1 capsule by mouth 2 (two) times daily., Disp: , Rfl:    tadalafil (CIALIS) 5 MG tablet, Take 1 tablet (5 mg total) by mouth at bedtime., Disp: 90 tablet, Rfl: 3   tamsulosin (FLOMAX) 0.4 MG CAPS capsule, Take 1 capsule (0.4 mg total) by mouth daily., Disp: 90 capsule, Rfl: 3   Testosterone 1.62 % GEL, APPLY ONE PUMP TOPICALLY TO EACH SHOULDER DAILY, Disp: 75 g, Rfl: 2   Turmeric 500 MG CAPS, Take 500 mg by mouth 2 (two) times daily. , Disp: , Rfl:    verapamil (CALAN-SR) 240 MG CR tablet, TAKE ONE TABLET BY MOUTH TWICE A DAY, Disp: 180 tablet, Rfl: 1   Medications ordered in this encounter:  Meds ordered this encounter  Medications   nirmatrelvir/ritonavir (PAXLOVID) 20 x 150 MG & 10 x '100MG'$  TABS    Sig: Take 3 tablets by mouth 2 (two) times daily  for 5 days. (Take nirmatrelvir 150 mg two tablets twice daily for 5 days and ritonavir 100 mg one tablet twice daily for 5 days) Patient GFR is >60    Dispense:  30 tablet    Refill:  0    Order Specific Question:   Supervising Provider    Answer:   Chase Picket [id number]     *If you need refills on other medications prior to your next appointment, please contact your pharmacy*  Follow-Up: Call back or seek an in-person evaluation if the symptoms worsen or if the condition fails to improve as anticipated.  [facility] [phone]  Other  Instructions  Recommend Mucinex.  Take tessalon as needed for cough.  Continue with rescue inhaler as needed for wheezing or shortness of breath.  If you develop worsening symptoms or trouble breathing follow up with an in person evaluation with your PCP or in Urgent Care.    If you have been instructed to have an in-person evaluation today at a local Urgent Care facility, please use the link below. It will take you to a list of all of our available [facility], including address, phone number and hours of operation. Please do not delay care.  [facility]  If you or a family member do not have a primary care provider, use the link below to schedule a visit and establish care. When you choose a [facility] primary care physician or advanced practice provider, you gain a long-term partner in health. Find a Primary Care Provider  Learn more about [facility] in-office and virtual care options: [facility]

## 2022-05-02 NOTE — Telephone Encounter (Signed)
  Chief Complaint: COVID + Symptoms: Cough, HA, BA, no taste, stuffy runny nose Frequency: weds Pertinent Negatives: Patient denies fever Disposition: []$ ED /[]$ Urgent Care (no appt availability in office) / [x]$ Appointment(In office/virtual)/ []$  [facility]/ []$ Home Care/ []$ Refused Recommended Disposition /[]$ [facility]/ []$  Follow-up with PCP Additional Notes: PT reports s/s starting weds. Pt tested + today. Pt has hx of asthma, and recently had pneumonia.  PT is unsure if he needs prescription medication.Pt also states that everything "always goes to his lungs".    Summary: COVID Postive   Tested positive for COVID today, coughing, no taste, unable to smell, body ache  Mild case but seeking clinical advice         Reason for Disposition  [1] HIGH RISK patient (e.g., weak immune system, age > 42 years, obesity with BMI 30 or higher, pregnant, chronic lung disease or other chronic medical condition) AND [2] COVID symptoms (e.g., cough, fever)  (Exceptions: Already seen by PCP and no new or worsening symptoms.)  Answer Assessment - Initial Assessment Questions 1. COVID-19 DIAGNOSIS: "How do you know that you have COVID?" (e.g., positive lab test or self-test, diagnosed by doctor or NP/PA, symptoms after exposure).     Home test 2. COVID-19 EXPOSURE: "Was there any known exposure to COVID before the symptoms began?" CDC Definition of close contact: within 6 feet (2 meters) for a total of 15 minutes or more over a 24-hour period.      Wife 3. ONSET: "When did the COVID-19 symptoms start?"      weds 4. WORST SYMPTOM: "What is your worst symptom?" (e.g., cough, fever, shortness of breath, muscle aches)     Cough 5. COUGH: "Do you have a cough?" If Yes, ask: "How bad is the cough?"       Yes 6. FEVER: "Do you have a fever?" If Yes, ask: "What is your temperature, how was it measured, and when did it start?"     No fever 7. RESPIRATORY STATUS: "Describe your breathing?"  (e.g., normal; shortness of breath, wheezing, unable to speak)      No problem at present 8. BETTER-SAME-WORSE: "Are you getting better, staying the same or getting worse compared to yesterday?"  If getting worse, ask, "In what way?"     Better than yesterday 9. OTHER SYMPTOMS: "Do you have any other symptoms?"  (e.g., chills, fatigue, headache, loss of smell or taste, muscle pain, sore throat)     HA, Stuffy, runny nose, Achy 10. HIGH RISK DISEASE: "Do you have any chronic medical problems?" (e.g., asthma, heart or lung disease, weak immune system, obesity, etc.)       Asthma 11. VACCINE: "Have you had the COVID-19 vaccine?" If Yes, ask: "Which one, how many shots, when did you get it?"       Yes 13. O2 SATURATION MONITOR:  "Do you use an oxygen saturation monitor (pulse oximeter) at home?" If Yes, ask "What is your reading (oxygen level) today?" "What is your usual oxygen saturation reading?" (e.g., 95%)       97 %  Protocols used: Coronavirus (COVID-19) Diagnosed or Suspected-A-AH

## 2022-05-07 ENCOUNTER — Ambulatory Visit
Admission: RE | Admit: 2022-05-07 | Discharge: 2022-05-07 | Disposition: A | Discharge status: Home / Self Care | Payer: HMO | Attending: Physician Assistant | Admitting: Physician Assistant

## 2022-05-07 ENCOUNTER — Ambulatory Visit (INDEPENDENT_AMBULATORY_CARE_PROVIDER_SITE_OTHER): Payer: HMO | Admitting: Physician Assistant

## 2022-05-07 ENCOUNTER — Ambulatory Visit (INDEPENDENT_AMBULATORY_CARE_PROVIDER_SITE_OTHER): Payer: HMO | Admitting: Surgery

## 2022-05-07 ENCOUNTER — Ambulatory Visit
Admission: RE | Admit: 2022-05-07 | Discharge: 2022-05-07 | Disposition: A | Discharge status: Home / Self Care | Payer: HMO | Source: Ambulatory Visit | Attending: Physician Assistant | Admitting: Physician Assistant

## 2022-05-07 ENCOUNTER — Encounter: Payer: Self-pay | Admitting: Physician Assistant

## 2022-05-07 ENCOUNTER — Encounter: Payer: Self-pay | Admitting: Surgery

## 2022-05-07 VITALS — BP 135/65 | HR 65 | Temp 97.9°F | Wt 201.0 lb

## 2022-05-07 VITALS — BP 138/76 | HR 89 | Temp 98.1°F | Ht 72.0 in | Wt 197.6 lb

## 2022-05-07 DIAGNOSIS — L0233 Carbuncle of buttock: Secondary | ICD-10-CM | POA: Diagnosis not present

## 2022-05-07 DIAGNOSIS — R0602 Shortness of breath: Secondary | ICD-10-CM | POA: Diagnosis not present

## 2022-05-07 DIAGNOSIS — R0989 Other specified symptoms and signs involving the circulatory and respiratory systems: Secondary | ICD-10-CM

## 2022-05-07 DIAGNOSIS — R059 Cough, unspecified: Secondary | ICD-10-CM | POA: Diagnosis not present

## 2022-05-07 DIAGNOSIS — R053 Chronic cough: Secondary | ICD-10-CM

## 2022-05-07 MED ORDER — CEFDINIR 300 MG PO CAPS: 300.0000 mg | ORAL_CAPSULE | Freq: Two times a day (BID) | ORAL | 0 refills | Status: DC

## 2022-05-07 MED ORDER — PREDNISONE 20 MG PO TABS: 20.0000 mg | ORAL_TABLET | Freq: Every day | ORAL | 0 refills | Status: DC

## 2022-05-07 NOTE — Patient Instructions (Signed)
If you have any concerns or questions, please feel free to call our office.   Skin Abscess  A skin abscess is an infected spot of skin. It can have pus in it. An abscess can happen in any part of your body. Some abscesses break open (rupture) on their own. Most keep getting worse unless they are treated. If your abscess is not treated, the infection can spread deeper into your body and blood. This can make you feel sick. What are the causes? Germs that enter your skin. This may happen if you have: A cut or scrape. A wound from a needle or an insect bite. Blocked oil or sweat glands. A problem with the spot where your hair goes into your skin. A fluid-filled sac called a cyst under your skin. What increases the risk? Having problems with how your blood moves through your body. Having a weak body defense system (immune system). Having diabetes. Having dry and irritated skin. Needing to get shots often. Putting drugs into your body with a needle. Having a splinter or something else in your skin. Smoking. What are the signs or symptoms? A firm bump under your skin that hurts. A bump with pus at the top. Redness and swelling. Warm or tender spots. A sore on the skin. How is this treated? You may need to: Put a heat pack or a warm, wet washcloth on the spot. Have the pus drained. Take antibiotics. Follow these instructions at home: Medicines Take over-the-counter and prescription medicines only as told by your doctor. If you were prescribed antibiotics, take them as told by your doctor. Do not stop taking them even if you start to feel better. Abscess care  If you have an abscess that has not drained, put heat on it. Use the heat source that your doctor recommends, such as a moist heat pack or a heating pad. Place a towel between your skin and the heat source. Leave the heat on for 20-30 minutes. If your skin turns bright red, take off the heat right away to prevent burns. The  risk of burns is higher if you cannot feel pain, heat, or cold. Follow instructions from your doctor about how to take care of your abscess. Make sure you: Cover the abscess with a bandage. Wash your hands with soap and water for at least 20 seconds before and after you change your bandage. If you cannot use soap and water, use hand sanitizer. Change your bandage as told by your doctor. Check your abscess every day for signs that the infection is getting worse. Check for: More redness, swelling, or pain. More fluid or blood. Warmth. More pus or a worse smell. General instructions To keep the infection from spreading: Do not share personal items or towels. Do not go in a hot tub with others. Avoid making skin contact with others. Be careful when you get rid of used bandages or any pus from the abscess. Do not smoke or use any products that contain nicotine or tobacco. If you need help quitting, ask your doctor. Contact a doctor if: You see red streaks on your skin near the abscess. You have any signs of worse infection. You vomit every time you eat or drink. You have a fever, chills, or muscle aches. The cyst or abscess comes back. Get help right away if: You have very bad pain. You make less pee (urine) than normal. This information is not intended to replace advice given to you by your health care provider.  Make sure you discuss any questions you have with your health care provider. Document Revised: 10/09/2021 Document Reviewed: 10/09/2021 Elsevier Patient Education  [place].

## 2022-05-07 NOTE — Progress Notes (Signed)
Argentina Ponder DeSanto,acting as a Education administrator for [employer], PA-C.,have documented all relevant documentation on the behalf of Mardene Speak, PA-C,as directed by  [employer], PA-C while in the presence of [employer], PA-C.      Established patient visit   Patient: [name] [name]   DOB: 1955-06-14   67 y.o. Male  MRN: [id number] Visit Date: 05/07/2022  Today's healthcare provider: Mardene Speak, PA-C   No chief complaint on file.  Subjective    HPI  Patient is a 67 year old male who presents for evaluation of sinus symptoms.  He states he tested positive last week and was treated with Paxlovid.  He felt he was doing well and his chest was clearing until yesterday.  He took his last Paxlovid this morning.  Yesterday he began having symptoms that consolidated more to his head.  Congestion and phlegm with green to the color.  Medications: Outpatient Medications Prior to Visit  Medication Sig   albuterol (VENTOLIN HFA) 108 (90 Base) MCG/ACT inhaler Inhale 1-2 puffs into the lungs every 6 (six) hours as needed for wheezing or shortness of breath.   Calcium Carb-Cholecalciferol (CALCIUM 600+D) 600-800 MG-UNIT TABS Take 1 tablet by mouth daily.   DEXILANT 60 MG capsule TAKE ONE CAPSULE BY MOUTH ONE TIME DAILY   dutasteride (AVODART) 0.5 MG capsule Take 1 capsule (0.5 mg total) by mouth daily.   famotidine (PEPCID) 40 MG tablet TAKE ONE TABLET BY MOUTH DAILY (Patient taking differently: Take 40 mg by mouth at bedtime.)   fluticasone (FLONASE) 50 MCG/ACT nasal spray Place 1 spray into both nostrils daily.   fluticasone-salmeterol (ADVAIR) 250-50 MCG/ACT AEPB Inhale 1 puff into the lungs in the morning and at bedtime.   hydrochlorothiazide (HYDRODIURIL) 25 MG tablet Take 1 tablet (25 mg total) by mouth daily.   ketotifen (ZADITOR) 0.025 % ophthalmic solution Place 1 drop into both eyes daily.    losartan (COZAAR) 100 MG tablet TAKE ONE TABLET BY MOUTH ONE TIME DAILY   Misc Natural  Products (GLUCOSAMINE CHONDROITIN TRIPLE) TABS Take 1 tablet by mouth 2 (two) times daily.   Multiple Vitamin (MULTI-VITAMINS) TABS Take 1 tablet by mouth daily.    Multiple Vitamins-Minerals (PRESERVISION AREDS 2) CAPS Take 1 capsule by mouth 2 (two) times daily.   nirmatrelvir/ritonavir (PAXLOVID) 20 x 150 MG & 10 x '100MG'$  TABS Take 3 tablets by mouth 2 (two) times daily for 5 days. (Take nirmatrelvir 150 mg two tablets twice daily for 5 days and ritonavir 100 mg one tablet twice daily for 5 days) Patient GFR is >60   tadalafil (CIALIS) 5 MG tablet Take 1 tablet (5 mg total) by mouth at bedtime.   tamsulosin (FLOMAX) 0.4 MG CAPS capsule Take 1 capsule (0.4 mg total) by mouth daily.   Testosterone 1.62 % GEL APPLY ONE PUMP TOPICALLY TO EACH SHOULDER DAILY   Turmeric 500 MG CAPS Take 500 mg by mouth 2 (two) times daily.    verapamil (CALAN-SR) 240 MG CR tablet TAKE ONE TABLET BY MOUTH TWICE A DAY   No facility-administered medications prior to visit.    Review of Systems  Constitutional:  Negative for appetite change, chills, diaphoresis, fatigue and fever.  HENT:  Positive for congestion, ear discharge (no drainage but feels full), postnasal drip, rhinorrhea, sinus pressure, sneezing and voice change. Negative for ear pain, hearing loss, nosebleeds, sinus pain, sore throat, tinnitus and trouble swallowing.   Respiratory:  Positive for cough and wheezing. Negative for shortness of breath.  Cardiovascular:  Negative for chest pain, palpitations and leg swelling.  Gastrointestinal:  Negative for abdominal pain.  Musculoskeletal:  Negative for myalgias.       Objective    BP 135/65 (BP Location: Left Arm, Patient Position: Sitting, Cuff Size: Normal)   Pulse 65   Temp 97.9 F (36.6 C) (Oral)   Wt 201 lb (91.2 kg)   SpO2 97%   BMI 27.26 kg/m    Physical Exam Vitals reviewed.  Constitutional:      General: He is in acute distress.     Appearance: Normal appearance. He is not  ill-appearing, toxic-appearing or diaphoretic.  HENT:     Head: Normocephalic and atraumatic.     Right Ear: There is impacted cerumen.     Left Ear: Tympanic membrane, ear canal and external ear normal.     Nose: Congestion and rhinorrhea present.     Mouth/Throat:     Pharynx: Posterior oropharyngeal erythema present.  Eyes:     General: No scleral icterus.       Right eye: No discharge.        Left eye: No discharge.     Extraocular Movements: Extraocular movements intact.     Conjunctiva/sclera: Conjunctivae normal.     Pupils: Pupils are equal, round, and reactive to light.  Cardiovascular:     Rate and Rhythm: Normal rate and regular rhythm.     Pulses: Normal pulses.     Heart sounds: Normal heart sounds. No murmur heard. Pulmonary:     Effort: Pulmonary effort is normal. No respiratory distress.     Breath sounds: Rhonchi and rales present. No wheezing.  Abdominal:     General: Abdomen is flat. Bowel sounds are normal.     Palpations: Abdomen is soft.  Musculoskeletal:        General: Normal range of motion.     Cervical back: Normal range of motion and neck supple.     Right lower leg: No edema.     Left lower leg: No edema.  Lymphadenopathy:     Cervical: No cervical adenopathy.  Skin:    General: Skin is warm and dry.     Findings: No rash.  Neurological:     General: No focal deficit present.     Mental Status: He is alert and oriented to person, place, and time. Mental status is at baseline.  Psychiatric:        Behavior: Behavior normal.        Thought Content: Thought content normal.      No results found for any visits on 05/07/22.  Assessment & Plan     1. Chronic cough 2. Upper respiratory symptoms 3. Abnormal lung sounds 4. Shortness of breath Post covid, post paxlovid X 7 days Could be pneumonia, bronchitis or exacerbation of his asthma In the setting of asthma and hiatal hernia, Advised to continue taking albuterol  Q 4-6 hours for  wheezing, cough and SOB  and advair 250-50 mcg for asthma hydrate Pt is scheduled for surgery/hiatal hernia - cefdinir (OMNICEF) 300 MG capsule; Take 1 capsule (300 mg total) by mouth 2 (two) times daily.  Dispense: 14 capsule; Refill: 0 - predniSONE (DELTASONE) 20 MG tablet; Take 1 tablet (20 mg total) by mouth daily with breakfast.  Dispense: 10 tablet; Refill: 0 - DG Chest 2 View; Future   No follow-ups on file. FU in a week if needed    The patient was advised to call back or seek  an in-person evaluation if the symptoms worsen or if the condition fails to improve as anticipated.  I discussed the assessment and treatment plan with the patient. The patient was provided an opportunity to ask questions and all were answered. The patient agreed with the plan and demonstrated an understanding of the instructions.  I, Mardene Speak, PA-C have reviewed all documentation for this visit. The documentation on  05/07/22  for the exam, diagnosis, procedures, and orders are all accurate and complete.  Mardene Speak, [facility], [place] [phone] (phone) [phone] (fax)   [place]

## 2022-05-08 DIAGNOSIS — Z01818 Encounter for other preprocedural examination: Secondary | ICD-10-CM | POA: Diagnosis not present

## 2022-05-08 DIAGNOSIS — K219 Gastro-esophageal reflux disease without esophagitis: Secondary | ICD-10-CM | POA: Diagnosis not present

## 2022-05-08 DIAGNOSIS — Z8616 Personal history of COVID-19: Secondary | ICD-10-CM | POA: Diagnosis not present

## 2022-05-08 DIAGNOSIS — K449 Diaphragmatic hernia without obstruction or gangrene: Secondary | ICD-10-CM | POA: Diagnosis not present

## 2022-05-08 DIAGNOSIS — G4733 Obstructive sleep apnea (adult) (pediatric): Secondary | ICD-10-CM | POA: Diagnosis not present

## 2022-05-08 DIAGNOSIS — I10 Essential (primary) hypertension: Secondary | ICD-10-CM | POA: Diagnosis not present

## 2022-05-08 DIAGNOSIS — J4530 Mild persistent asthma, uncomplicated: Secondary | ICD-10-CM | POA: Diagnosis not present

## 2022-05-08 NOTE — Progress Notes (Signed)
Outpatient Surgical Follow Up  05/08/2022  MARCANGELO HEMMING is an 67 y.o. male.   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  He Was seen recently for a carbuncle on his buttocks that has improved significantly.  No fevers no chills no pain.   Past Medical History:  Diagnosis Date   Allergy    Asthma    BP (high blood pressure) 11/07/2014   Should stop combination of Tekturna (DRI)  and ARB    Hypertension    HYPERTENSION, SEVERE 03/15/2007   Qualifier: Diagnosis of  By: Tilden Dome     Prostate enlargement    Sleep apnea     Past Surgical History:  Procedure Laterality Date   APPENDECTOMY  1990   Dr Bary Castilla   COLONOSCOPY  2008   Dr Bary Castilla   COLONOSCOPY WITH PROPOFOL N/A 08/13/2016   Procedure: COLONOSCOPY WITH PROPOFOL;  Surgeon: Robert Bellow, MD;  Location: [facility] ENDOSCOPY;  Service: Endoscopy;  Laterality: N/A;   HEMORRHOID SURGERY     HERNIA REPAIR  [id number]   umbilical hernia   KNEE ARTHROSCOPY Left    KNEE SURGERY Left 1986   NASAL SEPTUM SURGERY     TONSILLECTOMY     TOTAL KNEE ARTHROPLASTY Left 03/29/2019   Procedure: TOTAL KNEE ARTHROPLASTY;  Surgeon: Corky Mull, MD;  Location: [facility] ORS;  Service: Orthopedics;  Laterality: Left;   VASECTOMY      Family History  Problem Relation Age of Onset   Cancer Mother        ovarian   Glaucoma Mother    Cancer Father        prostate   Hypertension Sister    Asthma Sister    Alcohol abuse Paternal Uncle    Liver disease Paternal Uncle    Cancer - Prostate Paternal Uncle    Cancer Maternal Uncle        lung; two mat uncles   Cancer Paternal Grandmother        breast   Cancer Paternal Aunt        lung cancer in a smoker    Social History:  reports that he has never smoked. He has never used smokeless tobacco. He reports current alcohol use. He reports that he does not use  drugs.  Allergies:  Allergies  Allergen Reactions   Hydralazine Hives and Shortness Of Breath    Chest tightness, severe headache, dyspnea Other reaction(s): Chest Pain, Headache, Respiratory Distress   Gluten Meal Diarrhea    bloating   Moxifloxacin Hives    Avelox   Pseudoephedrine Other (See Comments)    Confusion, passed out, numbness   Sulfa Antibiotics Hives   Sulfonamide Derivatives Hives    Medications reviewed.    ROS Full ROS performed and is otherwise negative other than what is stated in HPI   BP 138/76   Pulse 89   Temp 98.1 F (36.7 C) (Oral)   Ht 6' (1.829 m)   Wt 197 lb 9.6 oz (89.6 kg)   SpO2 98%   BMI 26.80 kg/m   Physical Exam Vitals and nursing note reviewed. Exam conducted with a chaperone present.  Constitutional:      General: He is not in acute distress.    Appearance: Normal appearance. He is not ill-appearing or toxic-appearing.  Abdominal:     General: Abdomen is flat. There is  no distension.     Palpations: Abdomen is soft. There is no mass.     Tenderness: There is no abdominal tenderness. There is no guarding or rebound.     Hernia: No hernia is present.  Genitourinary:    Comments: Resolved carbuncle on gluteal area Skin:    General: Skin is warm and dry.     Capillary Refill: Capillary refill takes less than 2 seconds.  Neurological:     General: No focal deficit present.     Mental Status: He is alert and oriented to person, place, and time.  Psychiatric:        Mood and Affect: Mood normal.        Behavior: Behavior normal.        Thought Content: Thought content normal.        Judgment: Judgment normal.    Assessment/Plan: Resolved carbuncle of the right gluteal area no need for surgical intervention.  Nothing further to do  I spent 20 minutes in this encounter including reviewing medical records, coordinating his care, and performing appropriate documentation   Caroleen Hamman, MD [place] [facility]

## 2022-05-09 NOTE — Progress Notes (Signed)
Please, let pt know that his CXR results were normal. Most likely his current symptoms should be attributed to exacerbation of his asthma. Continue the current regimen as we discussed. If symptoms persist please, let me know. If you have any questions, you could contact me via my chart or phone. Kind regards, Mardene Speak, [facility], MMS [facility] [phone])

## 2022-05-13 NOTE — Progress Notes (Unsigned)
   Argentina Ponder DeSanto,acting as a Education administrator for [employer], PA-C.,have documented all relevant documentation on the behalf of Mardene Speak, PA-C,as directed by  [employer], PA-C while in the presence of [employer], PA-C.     Established patient visit   Patient: [name] [name]   DOB: 05/14/1955   67 y.o. Male  MRN: [id number] Visit Date: 05/14/2022  Today's healthcare provider: Mardene Speak, PA-C   No chief complaint on file.  Subjective    HPI  Patient is a 67 year old male who presents today for follow up of cough with upper respiratory symptoms including shortness of breath.  Medications: Outpatient Medications Prior to Visit  Medication Sig   albuterol (VENTOLIN HFA) 108 (90 Base) MCG/ACT inhaler Inhale 1-2 puffs into the lungs every 6 (six) hours as needed for wheezing or shortness of breath.   Calcium Carb-Cholecalciferol (CALCIUM 600+D) 600-800 MG-UNIT TABS Take 1 tablet by mouth daily.   cefdinir (OMNICEF) 300 MG capsule Take 1 capsule (300 mg total) by mouth 2 (two) times daily.   DEXILANT 60 MG capsule TAKE ONE CAPSULE BY MOUTH ONE TIME DAILY   dutasteride (AVODART) 0.5 MG capsule Take 1 capsule (0.5 mg total) by mouth daily.   famotidine (PEPCID) 40 MG tablet TAKE ONE TABLET BY MOUTH DAILY (Patient taking differently: Take 40 mg by mouth at bedtime.)   fluticasone (FLONASE) 50 MCG/ACT nasal spray Place 1 spray into both nostrils daily.   fluticasone-salmeterol (ADVAIR) 250-50 MCG/ACT AEPB Inhale 1 puff into the lungs in the morning and at bedtime.   hydrochlorothiazide (HYDRODIURIL) 25 MG tablet Take 1 tablet (25 mg total) by mouth daily.   ketotifen (ZADITOR) 0.025 % ophthalmic solution Place 1 drop into both eyes daily.    losartan (COZAAR) 100 MG tablet TAKE ONE TABLET BY MOUTH ONE TIME DAILY   Misc Natural Products (GLUCOSAMINE CHONDROITIN TRIPLE) TABS Take 1 tablet by mouth 2 (two) times daily.   Multiple Vitamin (MULTI-VITAMINS) TABS Take 1 tablet by  mouth daily.    Multiple Vitamins-Minerals (PRESERVISION AREDS 2) CAPS Take 1 capsule by mouth 2 (two) times daily.   predniSONE (DELTASONE) 20 MG tablet Take 1 tablet (20 mg total) by mouth daily with breakfast.   tadalafil (CIALIS) 5 MG tablet Take 1 tablet (5 mg total) by mouth at bedtime.   tamsulosin (FLOMAX) 0.4 MG CAPS capsule Take 1 capsule (0.4 mg total) by mouth daily.   Testosterone 1.62 % GEL APPLY ONE PUMP TOPICALLY TO EACH SHOULDER DAILY   Turmeric 500 MG CAPS Take 500 mg by mouth 2 (two) times daily.    verapamil (CALAN-SR) 240 MG CR tablet TAKE ONE TABLET BY MOUTH TWICE A DAY   No facility-administered medications prior to visit.    Review of Systems  {Labs  Heme  Chem  Endocrine  Serology  Results Review (optional):23779}   Objective    There were no vitals taken for this visit. {Show previous vital signs (optional):23777}  Physical Exam  ***  No results found for any visits on 05/14/22.  Assessment & Plan     ***  No follow-ups on file.      {provider attestation***:1}   Mardene Speak, PA-C  [place] [phone] (phone) [phone] (fax)  [place]

## 2022-05-14 ENCOUNTER — Ambulatory Visit (INDEPENDENT_AMBULATORY_CARE_PROVIDER_SITE_OTHER): Payer: HMO | Admitting: Physician Assistant

## 2022-05-14 ENCOUNTER — Encounter: Payer: Self-pay | Admitting: Physician Assistant

## 2022-05-14 VITALS — BP 132/65 | HR 77 | Temp 98.5°F | Wt 201.0 lb

## 2022-05-14 DIAGNOSIS — R0989 Other specified symptoms and signs involving the circulatory and respiratory systems: Secondary | ICD-10-CM

## 2022-05-14 DIAGNOSIS — K1379 Other lesions of oral mucosa: Secondary | ICD-10-CM | POA: Diagnosis not present

## 2022-05-14 MED ORDER — MAGIC MOUTHWASH W/LIDOCAINE: 5.0000 mL | Freq: Three times a day (TID) | ORAL | 0 refills | Status: DC | PRN

## 2022-05-14 MED ORDER — NYSTATIN 100000 UNIT/ML MT SUSP: 5.0000 mL | Freq: Three times a day (TID) | OROMUCOSAL | 0 refills | Status: DC

## 2022-05-26 NOTE — Progress Notes (Unsigned)
I,[name],acting as a scribe for [employer], MD.,have documented all relevant documentation on the behalf of [name] Foster, MD,as directed by  [name] Foster, MD while in the presence of [name] Foster, MD.   Established patient visit   Patient: [name] [name]   DOB: 08/15/1955   67 y.o. Male  MRN: [id number] Visit Date: 05/27/2022  Today's healthcare provider: [name], MD   Chief Complaint  Patient presents with   Cough   Subjective    HPI   Cough  Patient complains of nasal congestion and productive cough. The cough is productive of green/yellow sputum and is aggravated by  laying down and at night time  Patient does not have new pets. Patient does have a history of asthma. Patient does not have a history of environmental allergens. Patient has not recent travel. Patient does not have a history of smoking. Patient has previous Chest X-ray. He states that he does not want to be on steroids. States that his hernia schedule was counseled due to being on steroids earlier this year.    Medications: Outpatient Medications Prior to Visit  Medication Sig   albuterol (VENTOLIN HFA) 108 (90 Base) MCG/ACT inhaler Inhale 1-2 puffs into the lungs every 6 (six) hours as needed for wheezing or shortness of breath.   Calcium Carb-Cholecalciferol (CALCIUM 600+D) 600-800 MG-UNIT TABS Take 1 tablet by mouth daily.   DEXILANT 60 MG capsule TAKE ONE CAPSULE BY MOUTH ONE TIME DAILY   dutasteride (AVODART) 0.5 MG capsule Take 1 capsule (0.5 mg total) by mouth daily.   famotidine (PEPCID) 40 MG tablet TAKE ONE TABLET BY MOUTH DAILY (Patient taking differently: Take 40 mg by mouth at bedtime.)   fluticasone (FLONASE) 50 MCG/ACT nasal spray Place 1 spray into both nostrils daily.   fluticasone-salmeterol (ADVAIR) 250-50 MCG/ACT AEPB Inhale 1 puff into the lungs in the morning and at bedtime.   hydrochlorothiazide  (HYDRODIURIL) 25 MG tablet Take 1 tablet (25 mg total) by mouth daily.   ketotifen (ZADITOR) 0.025 % ophthalmic solution Place 1 drop into both eyes daily.    losartan (COZAAR) 100 MG tablet TAKE ONE TABLET BY MOUTH ONE TIME DAILY   Misc Natural Products (GLUCOSAMINE CHONDROITIN TRIPLE) TABS Take 1 tablet by mouth 2 (two) times daily.   Multiple Vitamin (MULTI-VITAMINS) TABS Take 1 tablet by mouth daily.    Multiple Vitamins-Minerals (PRESERVISION AREDS 2) CAPS Take 1 capsule by mouth 2 (two) times daily.   tadalafil (CIALIS) 5 MG tablet Take 1 tablet (5 mg total) by mouth at bedtime.   tamsulosin (FLOMAX) 0.4 MG CAPS capsule Take 1 capsule (0.4 mg total) by mouth daily.   Testosterone 1.62 % GEL APPLY ONE PUMP TOPICALLY TO EACH SHOULDER DAILY   Turmeric 500 MG CAPS Take 500 mg by mouth 2 (two) times daily.    verapamil (CALAN-SR) 240 MG CR tablet TAKE ONE TABLET BY MOUTH TWICE A DAY   magic mouthwash (nystatin, lidocaine, diphenhydrAMINE) suspension Take 5 mLs by mouth 3 (three) times daily.   magic mouthwash w/lidocaine SOLN Take 5 mLs by mouth 3 (three) times daily as needed for mouth pain.   predniSONE (DELTASONE) 20 MG tablet Take 1 tablet (20 mg total) by mouth daily with breakfast.   [DISCONTINUED] cefdinir (OMNICEF) 300 MG capsule Take 1 capsule (300 mg total) by mouth 2 (two) times daily.   No facility-administered medications prior to visit.    Review of Systems     Objective  BP 137/71 (BP Location: Left Arm, Patient Position: Sitting, Cuff Size: Large)   Pulse 64   Temp 98.2 F (36.8 C) (Oral)   Resp 16   Ht 6' (1.829 m)   Wt 204 lb 6.4 oz (92.7 kg)   SpO2 96%   BMI 27.72 kg/m    Physical Exam Vitals reviewed.  Constitutional:      General: He is not in acute distress.    Appearance: Normal appearance. He is not ill-appearing, toxic-appearing or diaphoretic.  Eyes:     Conjunctiva/sclera: Conjunctivae normal.  Neck:     Thyroid: No thyroid mass, thyromegaly  or thyroid tenderness.     Vascular: No carotid bruit.  Cardiovascular:     Rate and Rhythm: Normal rate and regular rhythm.     Pulses: Normal pulses.     Heart sounds: Normal heart sounds. No murmur heard.    No friction rub. No gallop.  Pulmonary:     Effort: Pulmonary effort is normal. No tachypnea or respiratory distress.     Breath sounds: No stridor. Examination of the left-upper field reveals wheezing. Examination of the right-middle field reveals wheezing. Examination of the left-middle field reveals wheezing. Examination of the right-lower field reveals wheezing and rhonchi. Examination of the left-lower field reveals wheezing and rhonchi. Wheezing and rhonchi present. No rales.  Abdominal:     General: Bowel sounds are normal. There is no distension.     Palpations: Abdomen is soft.     Tenderness: There is no abdominal tenderness.  Musculoskeletal:     Right lower leg: No edema.     Left lower leg: No edema.  Lymphadenopathy:     Cervical: No cervical adenopathy.  Skin:    Findings: No erythema or rash.  Neurological:     Mental Status: He is alert and oriented to person, place, and time.       No results found for any visits on 05/27/22.  Assessment & Plan     Problem List Items Addressed This Visit       Respiratory   Refractory chronic cough - Primary    Treated for otitis media and asthma exacerbation with Augmentin for 10 day course  Prescribed tessalon perles and robitussin AC for nighttime cough        Mild intermittent asthma with acute exacerbation    Acute asthma exacerbation Patient also with symptoms concerning for acute otitis media with suppurative effusion Patient requests to no longer be on steroids for respiratory symptoms  Will prescribe Augmentin 875-125 twice daily for 10 days  Prescribed Tessalon Perles for cough per patient request  Also prescribed robitussin AC at bedtime 100-10mg /61mL due to worsened nighttime cough  Discussed ED  precautions including SOB, lightheadedness or chest tightness  F/u PRN if symptoms are not improved         Return in about 10 years (around 05/26/2032), or if symptoms worsen or fail to improve.      The entirety of the information documented in the History of Present Illness, Review of Systems and Physical Exam were personally obtained by me. Portions of this information were initially documented by Lyndel Pleasure, CMA. I, [name] Foster, MD have reviewed the documentation above for thoroughness and accuracy.   [name] Foster, MD  [facility] [phone] (phone) [phone] (fax)  [place]

## 2022-05-27 ENCOUNTER — Ambulatory Visit (INDEPENDENT_AMBULATORY_CARE_PROVIDER_SITE_OTHER): Payer: HMO | Admitting: Family Medicine

## 2022-05-27 ENCOUNTER — Encounter: Payer: Self-pay | Admitting: Family Medicine

## 2022-05-27 VITALS — BP 137/71 | HR 64 | Temp 98.2°F | Resp 16 | Ht 72.0 in | Wt 204.4 lb

## 2022-05-27 DIAGNOSIS — J4521 Mild intermittent asthma with (acute) exacerbation: Secondary | ICD-10-CM

## 2022-05-27 DIAGNOSIS — R053 Chronic cough: Secondary | ICD-10-CM

## 2022-05-27 MED ORDER — BENZONATATE 200 MG PO CAPS: 200.0000 mg | ORAL_CAPSULE | Freq: Two times a day (BID) | ORAL | 2 refills | Status: DC | PRN

## 2022-05-27 MED ORDER — GUAIFENESIN-CODEINE 100-10 MG/5ML PO SYRP: 5.0000 mL | ORAL_SOLUTION | Freq: Three times a day (TID) | ORAL | 0 refills | Status: DC | PRN

## 2022-05-27 MED ORDER — AMOXICILLIN-POT CLAVULANATE 875-125 MG PO TABS: 1.0000 | ORAL_TABLET | Freq: Two times a day (BID) | ORAL | 0 refills | Status: DC

## 2022-05-27 NOTE — Patient Instructions (Addendum)
I have prescribed Augmentin for you to take two times daily for the next 10 days   I have prescribed Tessalon perles to help with the cough as well as Robitussin AC for night time cough if the perles do not help the symptoms    Please return to care if you are not improved after 10 days of treatment    Please seek care urgently if you have trouble breathing or become lightheaded.

## 2022-05-28 NOTE — Assessment & Plan Note (Signed)
Acute asthma exacerbation Patient also with symptoms concerning for acute otitis media with suppurative effusion Patient requests to no longer be on steroids for respiratory symptoms  Will prescribe Augmentin 875-125 twice daily for 10 days  Prescribed Tessalon Perles for cough per patient request  Also prescribed robitussin AC at bedtime 100-10mg /66mL due to worsened nighttime cough  Discussed ED precautions including SOB, lightheadedness or chest tightness  F/u PRN if symptoms are not improved

## 2022-05-28 NOTE — Assessment & Plan Note (Signed)
Treated for otitis media and asthma exacerbation with Augmentin for 10 day course  Prescribed tessalon perles and robitussin AC for nighttime cough

## 2022-06-07 DIAGNOSIS — M47812 Spondylosis without myelopathy or radiculopathy, cervical region: Secondary | ICD-10-CM | POA: Diagnosis not present

## 2022-06-07 DIAGNOSIS — M79632 Pain in left forearm: Secondary | ICD-10-CM | POA: Diagnosis not present

## 2022-06-07 DIAGNOSIS — I10 Essential (primary) hypertension: Secondary | ICD-10-CM | POA: Diagnosis not present

## 2022-06-07 DIAGNOSIS — Z043 Encounter for examination and observation following other accident: Secondary | ICD-10-CM | POA: Diagnosis not present

## 2022-06-07 DIAGNOSIS — Z1152 Encounter for screening for COVID-19: Secondary | ICD-10-CM | POA: Diagnosis not present

## 2022-06-07 DIAGNOSIS — R55 Syncope and collapse: Secondary | ICD-10-CM | POA: Diagnosis not present

## 2022-06-07 DIAGNOSIS — S40022A Contusion of left upper arm, initial encounter: Secondary | ICD-10-CM | POA: Diagnosis not present

## 2022-06-07 DIAGNOSIS — R519 Headache, unspecified: Secondary | ICD-10-CM | POA: Diagnosis not present

## 2022-06-08 DIAGNOSIS — R55 Syncope and collapse: Secondary | ICD-10-CM | POA: Diagnosis not present

## 2022-06-09 ENCOUNTER — Telehealth: Payer: Self-pay | Admitting: Family Medicine

## 2022-06-09 DIAGNOSIS — K219 Gastro-esophageal reflux disease without esophagitis: Secondary | ICD-10-CM

## 2022-06-09 MED ORDER — DEXLANSOPRAZOLE 60 MG PO CPDR
1.0000 | DELAYED_RELEASE_CAPSULE | Freq: Every day | ORAL | 1 refills | Status: DC
Start: 2022-06-09 — End: 2022-12-17

## 2022-06-09 NOTE — Telephone Encounter (Signed)
[facility] pharmacy faxed refill request for the following medications:   DEXILANT 60 MG capsule    Please advise

## 2022-06-17 NOTE — Progress Notes (Unsigned)
I,[name] [name],acting as a scribe for [employer], MD.,have documented all relevant documentation on the behalf of [name] Ramp, MD,as directed by  [name] Ramp, MD while in the presence of [name] Ramp, MD.   Established patient visit   Patient: [name] [name]   DOB: [birth year]   67 y.o. Male  MRN: [id number] Visit Date: 06/18/2022  Today's healthcare provider: [name], MD   Chief Complaint  Patient presents with   Hospitalization Follow-up   Subjective    HPI  Follow up ER visit  Patient was seen in ER for Syncope on 06/07/2022. Treatment for this included CT-Negative, labs, imaging,referral to Cardiology. Patient reports that the day after the Syncope was feeling very tired but he is feeling better now. Has hx of OSA reports using CPAP nightly  He denies further syncopal episodes  Patient states he had just finished dinner prior to syncopal event  He states that has been able to eat normally  He does report more coughing last night and that he felt slightly dizziness for a few moments  States that he skipped breakfast prior to this appointment  He denies palpitations (had these several years ago and saw Dr. [name] for a year with normal report for cardiac hx)  He reports he drinks plenty of fluids  -----------------------------------------------------------------------------------------  Medications: Outpatient Medications Prior to Visit  Medication Sig   albuterol (VENTOLIN HFA) 108 (90 Base) MCG/ACT inhaler Inhale 1-2 puffs into the lungs every 6 (six) hours as needed for wheezing or shortness of breath.   benzonatate (TESSALON) 200 MG capsule Take 1 capsule (200 mg total) by mouth 2 (two) times daily as needed for cough.   Calcium Carb-Cholecalciferol (CALCIUM 600+D) 600-800 MG-UNIT TABS Take 1 tablet by mouth daily.   dexlansoprazole (DEXILANT) 60 MG capsule Take 1 capsule (60 mg total)  by mouth daily.   dutasteride (AVODART) 0.5 MG capsule Take 1 capsule (0.5 mg total) by mouth daily.   famotidine (PEPCID) 40 MG tablet TAKE ONE TABLET BY MOUTH DAILY (Patient taking differently: Take 40 mg by mouth at bedtime.)   fluticasone (FLONASE) 50 MCG/ACT nasal spray Place 1 spray into both nostrils daily.   fluticasone-salmeterol (ADVAIR) 250-50 MCG/ACT AEPB Inhale 1 puff into the lungs in the morning and at bedtime.   guaiFENesin-codeine (ROBITUSSIN AC) 100-10 MG/5ML syrup Take 5 mLs by mouth 3 (three) times daily as needed for cough.   ketotifen (ZADITOR) 0.025 % ophthalmic solution Place 1 drop into both eyes daily.    losartan (COZAAR) 100 MG tablet TAKE ONE TABLET BY MOUTH ONE TIME DAILY   Misc Natural Products (GLUCOSAMINE CHONDROITIN TRIPLE) TABS Take 1 tablet by mouth 2 (two) times daily.   Multiple Vitamin (MULTI-VITAMINS) TABS Take 1 tablet by mouth daily.    Multiple Vitamins-Minerals (PRESERVISION AREDS 2) CAPS Take 1 capsule by mouth 2 (two) times daily.   tadalafil (CIALIS) 5 MG tablet Take 1 tablet (5 mg total) by mouth at bedtime.   tamsulosin (FLOMAX) 0.4 MG CAPS capsule Take 1 capsule (0.4 mg total) by mouth daily.   Turmeric 500 MG CAPS Take 500 mg by mouth 2 (two) times daily.    verapamil (CALAN-SR) 240 MG CR tablet TAKE ONE TABLET BY MOUTH TWICE A DAY   [DISCONTINUED] hydrochlorothiazide (HYDRODIURIL) 25 MG tablet Take 1 tablet (25 mg total) by mouth daily.   [DISCONTINUED] amoxicillin-clavulanate (AUGMENTIN) 875-125 MG tablet Take 1 tablet by mouth 2 (two) times daily.   [DISCONTINUED] Testosterone 1.62 %  GEL APPLY ONE PUMP TOPICALLY TO EACH SHOULDER DAILY   No facility-administered medications prior to visit.    Review of Systems  {Labs  Heme  Chem  Endocrine  Serology  Results Review (optional):23779}   Objective    BP 137/75 (BP Location: Left Arm, Patient Position: Sitting, Cuff Size: Normal)   Pulse 81   Temp 98 F (36.7 C) (Oral)   Resp 16    Wt 203 lb 9.6 oz (92.4 kg)   BMI 27.61 kg/m  {Show previous vital signs (optional):23777}  Physical Exam Vitals reviewed.  Constitutional:      General: He is not in acute distress.    Appearance: Normal appearance. He is not ill-appearing, toxic-appearing or diaphoretic.  Eyes:     Conjunctiva/sclera: Conjunctivae normal.  Neck:     Thyroid: No thyroid mass, thyromegaly or thyroid tenderness.     Vascular: No carotid bruit.  Cardiovascular:     Rate and Rhythm: Normal rate and regular rhythm.     Pulses: Normal pulses.     Heart sounds: Normal heart sounds. No murmur heard.    No friction rub. No gallop.  Pulmonary:     Effort: Pulmonary effort is normal. No respiratory distress.     Breath sounds: No stridor. Examination of the right-middle field reveals rhonchi. Examination of the left-middle field reveals rhonchi. Examination of the right-lower field reveals rhonchi. Examination of the left-lower field reveals rhonchi. Rhonchi present. No wheezing or rales.     Comments: Coarse expiratory breath sounds  Abdominal:     General: Bowel sounds are normal. There is no distension.     Palpations: Abdomen is soft.     Tenderness: There is no abdominal tenderness.  Musculoskeletal:     Right lower leg: No edema.     Left lower leg: No edema.  Lymphadenopathy:     Cervical: No cervical adenopathy.  Skin:    Findings: No erythema or rash.  Neurological:     Mental Status: He is alert and oriented to person, place, and time.      No results found for any visits on 06/18/22.  Assessment & Plan     Problem List Items Addressed This Visit       Cardiovascular and Mediastinum   Essential hypertension - Primary    BP is within normal limits today  At goal  Continue HCTZ, will reduce dose to 12.5mg  from 25mg  daily  Will continue losartan 100mg  daily and verapamil 240mg  daily       Relevant Medications   hydrochlorothiazide (MICROZIDE) 12.5 MG capsule     Other   Syncope  and collapse   Relevant Orders   Ambulatory referral to Cardiology      No follow-ups on file.        The entirety of the information documented in the History of Present Illness, Review of Systems and Physical Exam were personally obtained by me. Portions of this information were initially documented by Hetty Ely, CMA . I, [name] Ramp, MD have reviewed the documentation above for thoroughness and accuracy.      [name] Ramp, MD  [facility] [phone] (phone) [phone] (fax)  [facility]

## 2022-06-18 ENCOUNTER — Encounter: Payer: Self-pay | Admitting: Family Medicine

## 2022-06-18 ENCOUNTER — Ambulatory Visit (INDEPENDENT_AMBULATORY_CARE_PROVIDER_SITE_OTHER): Payer: HMO | Admitting: Family Medicine

## 2022-06-18 VITALS — BP 137/75 | HR 81 | Temp 98.0°F | Resp 16 | Wt 203.6 lb

## 2022-06-18 DIAGNOSIS — I10 Essential (primary) hypertension: Secondary | ICD-10-CM | POA: Diagnosis not present

## 2022-06-18 DIAGNOSIS — R55 Syncope and collapse: Secondary | ICD-10-CM | POA: Diagnosis not present

## 2022-06-18 MED ORDER — HYDROCHLOROTHIAZIDE 12.5 MG PO CAPS
12.5000 mg | ORAL_CAPSULE | Freq: Every day | ORAL | 1 refills | Status: DC
Start: 2022-06-18 — End: 2022-10-03

## 2022-06-18 NOTE — Assessment & Plan Note (Signed)
BP is within normal limits today  At goal  Continue HCTZ, will reduce dose to 12.5mg  from 25mg  daily  Will continue losartan 100mg  daily and verapamil 240mg  daily

## 2022-06-19 ENCOUNTER — Encounter: Payer: Self-pay | Admitting: Family Medicine

## 2022-06-19 NOTE — Assessment & Plan Note (Signed)
Patient presents for ED follow-up following syncopal event [name] [name] [name] [name] decrease hydrochlorothiazide from 25 to 12.5 mg daily [name] [name] submit cardiology referral for further evaluation, suspect that patient may have element of pulmonary hypertension given pulmonary hx,also considered underlying arrhythmia We discussed the above potential etiologies as well as abdominal vessel malformation of atherosclerosis given syncopal incidence after eating

## 2022-06-20 DIAGNOSIS — R0789 Other chest pain: Secondary | ICD-10-CM | POA: Diagnosis not present

## 2022-06-20 DIAGNOSIS — Z8616 Personal history of COVID-19: Secondary | ICD-10-CM | POA: Diagnosis not present

## 2022-06-20 DIAGNOSIS — I10 Essential (primary) hypertension: Secondary | ICD-10-CM | POA: Diagnosis not present

## 2022-06-20 DIAGNOSIS — R55 Syncope and collapse: Secondary | ICD-10-CM | POA: Diagnosis not present

## 2022-06-20 DIAGNOSIS — G4733 Obstructive sleep apnea (adult) (pediatric): Secondary | ICD-10-CM | POA: Diagnosis not present

## 2022-06-20 DIAGNOSIS — R002 Palpitations: Secondary | ICD-10-CM | POA: Diagnosis not present

## 2022-06-20 DIAGNOSIS — J4540 Moderate persistent asthma, uncomplicated: Secondary | ICD-10-CM | POA: Diagnosis not present

## 2022-06-23 ENCOUNTER — Ambulatory Visit: Payer: Self-pay | Admitting: *Deleted

## 2022-06-23 ENCOUNTER — Other Ambulatory Visit: Payer: Self-pay | Admitting: Internal Medicine

## 2022-06-23 ENCOUNTER — Telehealth: Payer: Self-pay | Admitting: Urology

## 2022-06-23 DIAGNOSIS — R55 Syncope and collapse: Secondary | ICD-10-CM

## 2022-06-23 DIAGNOSIS — R0789 Other chest pain: Secondary | ICD-10-CM

## 2022-06-23 NOTE — Telephone Encounter (Signed)
  Chief Complaint: coughing up green mucus and blowing it from his nose.   Been fighting this with several rounds of antibiotics and cough medications since the end of Feb. Symptoms: Very frequent coughing, nasal and ear congestion Frequency: Since the end of Feb.  Antibiotics help then he is sick again. Pertinent Negatives: Patient denies fever. Disposition: [] ED /[] Urgent Care (no appt availability in office) / [x] Appointment(In office/virtual)/ []  [place] [facility]/ [] Home Care/ [] Refused Recommended Disposition /[] [place] [facility]/ []  Follow-up with PCP Additional Notes: Made him an appt. For 06/24/2022 at 2:20 with Merita Norton.

## 2022-06-23 NOTE — Telephone Encounter (Signed)
Message from Valora Piccolo sent at 06/23/2022 11:09 AM EDT  Summary: Cough & Congestion Advice   Pt is calling to report congestion and cough same sx from last month prescribed benzonatate (TESSALON) 200 MG capsule [[id number]] & guaiFENesin-codeine (ROBITUSSIN AC) 100-10 MG/5ML syrup [[id number]]. Report that this has not cleared up. Please advise          Call History   Type Contact Phone/Fax User  06/23/2022 11:08 AM EDT Phone (Incoming) [name] [name], [name] [name] "Thayer Ohm" (Self) [phone] Rexene Edison) Jens Som A   Reason for Disposition  [1] Continuous (nonstop) coughing interferes with work or school AND [2] no improvement using cough treatment per Care Advice  Answer Assessment - Initial Assessment Questions 1. ONSET: "When did the cough begin?"      Coughing up green mucus and green mucus from my nose.   This has been going on since end of Feb.   I have been treated for bronchitis twice.  2. SEVERITY: "How bad is the cough today?"      Bad 3. SPUTUM: "Describe the color of your sputum" (none, dry cough; clear, white, yellow, green)     Green  4. HEMOPTYSIS: "Are you coughing up any blood?" If so ask: "How much?" (flecks, streaks, tablespoons, etc.)     Not asked My chest x rays are clear.    I had pneumonia in Nov.   This feels like  bronchitis.    5. DIFFICULTY BREATHING: "Are you having difficulty breathing?" If Yes, ask: "How bad is it?" (e.g., mild, moderate, severe)    - MILD: No SOB at rest, mild SOB with walking, speaks normally in sentences, can lie down, no retractions, pulse < 100.    - MODERATE: SOB at rest, SOB with minimal exertion and prefers to sit, cannot lie down flat, speaks in phrases, mild retractions, audible wheezing, pulse 100-120.    - SEVERE: Very SOB at rest, speaks in single words, struggling to breathe, sitting hunched forward, retractions, pulse > 120      I'm wheezing a lot so I'm using my albuterol inhaler. 6. FEVER: "Do you have a fever?" If Yes,  ask: "What is your temperature, how was it measured, and when did it start?"     I'm using OTC cough med. During the day.   I use cough medicine with codeine at night. 7. CARDIAC HISTORY: "Do you have any history of heart disease?" (e.g., heart attack, congestive heart failure)      Not asked 8. LUNG HISTORY: "Do you have any history of lung disease?"  (e.g., pulmonary embolus, asthma, emphysema)     Asthma and bronchitis 9. PE RISK FACTORS: "Do you have a history of blood clots?" (or: recent major surgery, recent prolonged travel, bedridden)     Not asked 10. OTHER SYMPTOMS: "Do you have any other symptoms?" (e.g., runny nose, wheezing, chest pain)       Wheezing, my head and ears are congested. 11. PREGNANCY: "Is there any chance you are pregnant?" "When was your last menstrual period?"       N/A 12. TRAVEL: "Have you traveled out of the country in the last month?" (e.g., travel history, exposures)       N?A  Protocols used: Cough - Acute Productive-A-AH

## 2022-06-23 NOTE — Progress Notes (Unsigned)
I,J'ya E Tanisha Lutes,acting as a scribe for Jacky Kindle, FNP.,have documented all relevant documentation on the behalf of Jacky Kindle, FNP,as directed by  Jacky Kindle, FNP while in the presence of Jacky Kindle, FNP.   Established patient visit   Patient: [name] [name]   DOB: 1955/09/08   67 y.o. Male  MRN: [id number] Visit Date: 06/24/2022  Today's healthcare provider: Jacky Kindle, FNP   Subjective    HPI  Acute Bronchitis: Patient presents for presents evaluation of productive cough. Symptoms began 2 months ago and are unchanged since that time.  Past history is significant for asthma.  Has been ongoing since march, cleared up a few days after given antibiotics for 10 days. Patient has green/brown mucus from cough and nose.   Medications: Outpatient Medications Prior to Visit  Medication Sig   albuterol (VENTOLIN HFA) 108 (90 Base) MCG/ACT inhaler Inhale 1-2 puffs into the lungs every 6 (six) hours as needed for wheezing or shortness of breath.   benzonatate (TESSALON) 200 MG capsule Take 1 capsule (200 mg total) by mouth 2 (two) times daily as needed for cough.   Calcium Carb-Cholecalciferol (CALCIUM 600+D) 600-800 MG-UNIT TABS Take 1 tablet by mouth daily.   dexlansoprazole (DEXILANT) 60 MG capsule Take 1 capsule (60 mg total) by mouth daily.   dutasteride (AVODART) 0.5 MG capsule Take 1 capsule (0.5 mg total) by mouth daily.   famotidine (PEPCID) 40 MG tablet TAKE ONE TABLET BY MOUTH DAILY (Patient taking differently: Take 40 mg by mouth at bedtime.)   fluticasone (FLONASE) 50 MCG/ACT nasal spray Place 1 spray into both nostrils daily.   fluticasone-salmeterol (ADVAIR) 250-50 MCG/ACT AEPB Inhale 1 puff into the lungs in the morning and at bedtime.   hydrochlorothiazide (MICROZIDE) 12.5 MG capsule Take 1 capsule (12.5 mg total) by mouth daily.   ketotifen (ZADITOR) 0.025 % ophthalmic solution Place 1 drop into both eyes daily.    losartan (COZAAR) 100 MG tablet TAKE  ONE TABLET BY MOUTH ONE TIME DAILY   Misc Natural Products (GLUCOSAMINE CHONDROITIN TRIPLE) TABS Take 1 tablet by mouth 2 (two) times daily.   Multiple Vitamin (MULTI-VITAMINS) TABS Take 1 tablet by mouth daily.    Multiple Vitamins-Minerals (PRESERVISION AREDS 2) CAPS Take 1 capsule by mouth 2 (two) times daily.   tadalafil (CIALIS) 5 MG tablet Take 1 tablet (5 mg total) by mouth at bedtime.   tamsulosin (FLOMAX) 0.4 MG CAPS capsule Take 1 capsule (0.4 mg total) by mouth daily.   Turmeric 500 MG CAPS Take 500 mg by mouth 2 (two) times daily.    verapamil (CALAN-SR) 240 MG CR tablet TAKE ONE TABLET BY MOUTH TWICE A DAY   [DISCONTINUED] guaiFENesin-codeine (ROBITUSSIN AC) 100-10 MG/5ML syrup Take 5 mLs by mouth 3 (three) times daily as needed for cough.   No facility-administered medications prior to visit.    Review of Systems    Objective    BP 135/62 (BP Location: Left Arm, Patient Position: Sitting, Cuff Size: Normal)   Pulse 80   Temp 98 F (36.7 C) (Oral)   Resp 12   Ht 6' (1.829 m)   Wt 204 lb 11.2 oz (92.9 kg)   SpO2 100%   BMI 27.76 kg/m   Physical Exam Vitals and nursing note reviewed.  Constitutional:      Appearance: Normal appearance. He is well-groomed and overweight.  HENT:     Head: Normocephalic and atraumatic.     Right Ear:  Tympanic membrane, ear canal and external ear normal.     Left Ear: Tympanic membrane, ear canal and external ear normal.     Nose: Nose normal.     Mouth/Throat:     Mouth: Mucous membranes are moist.     Pharynx: Oropharynx is clear. No oropharyngeal exudate or posterior oropharyngeal erythema.  Cardiovascular:     Rate and Rhythm: Normal rate and regular rhythm.     Pulses: Normal pulses.     Heart sounds: Normal heart sounds.  Pulmonary:     Effort: Pulmonary effort is normal.     Breath sounds: Wheezing and rhonchi present.  Chest:     Chest wall: Tenderness present.  Musculoskeletal:        General: Normal range of motion.      Cervical back: Normal range of motion.  Skin:    General: Skin is warm and dry.     Capillary Refill: Capillary refill takes less than 2 seconds.  Neurological:     General: No focal deficit present.     Mental Status: He is alert and oriented to person, place, and time. Mental status is at baseline.     No results found for any visits on 06/24/22.  Assessment & Plan     Problem List Items Addressed This Visit       Respiratory   Moderate persistent asthma with exacerbation - Primary    Acute on chronic, flare Reports PRN albuterol 4x/day or more Will send in for triple therapy inhaler to switch from advair if covered Discussed non pharm treatment like vicks vapo steamers for bathroom Repeat ABX and steroids Pt declined use of singulair given previously medication failure RTC as needed; encouraged to call pulm to let them know of ongoing flare      Relevant Medications   guaiFENesin-codeine (ROBITUSSIN AC) 100-10 MG/5ML syrup   methylPREDNISolone (MEDROL DOSEPAK) 4 MG TBPK tablet   amoxicillin-clavulanate (AUGMENTIN) 875-125 MG tablet   Fluticasone-Umeclidin-Vilant (TRELEGY ELLIPTA) 200-62.5-25 MCG/ACT AEPB   Return if symptoms worsen or fail to improve.     Leilani Merl, FNP, have reviewed all documentation for this visit. The documentation on 06/24/22 for the exam, diagnosis, procedures, and orders are all accurate and complete.  Jacky Kindle, FNP  [facility] [phone] (phone) [phone] (fax)  [facility]

## 2022-06-23 NOTE — Telephone Encounter (Signed)
He will have to wait until his appt. We will be able to refill at that time.

## 2022-06-23 NOTE — Telephone Encounter (Signed)
Patient called and stated that his insurance [facility]) has told him that testosterone has to be pre-authorized. He missed his lab appt in January, but rescheduled for Wed 4/17. Please advise patient.

## 2022-06-24 ENCOUNTER — Encounter: Payer: Self-pay | Admitting: Family Medicine

## 2022-06-24 ENCOUNTER — Ambulatory Visit (INDEPENDENT_AMBULATORY_CARE_PROVIDER_SITE_OTHER): Payer: HMO | Admitting: Family Medicine

## 2022-06-24 VITALS — BP 135/62 | HR 80 | Temp 98.0°F | Resp 12 | Ht 72.0 in | Wt 204.7 lb

## 2022-06-24 DIAGNOSIS — J4541 Moderate persistent asthma with (acute) exacerbation: Secondary | ICD-10-CM | POA: Diagnosis not present

## 2022-06-24 MED ORDER — TRELEGY ELLIPTA 200-62.5-25 MCG/ACT IN AEPB
1.0000 | INHALATION_SPRAY | Freq: Two times a day (BID) | RESPIRATORY_TRACT | 11 refills | Status: DC
Start: 2022-06-24 — End: 2022-07-15

## 2022-06-24 MED ORDER — AMOXICILLIN-POT CLAVULANATE 875-125 MG PO TABS
1.0000 | ORAL_TABLET | Freq: Two times a day (BID) | ORAL | 0 refills | Status: DC
Start: 2022-06-24 — End: 2022-07-29

## 2022-06-24 MED ORDER — GUAIFENESIN-CODEINE 100-10 MG/5ML PO SYRP
5.0000 mL | ORAL_SOLUTION | Freq: Three times a day (TID) | ORAL | 0 refills | Status: DC | PRN
Start: 2022-06-24 — End: 2022-10-15

## 2022-06-24 MED ORDER — METHYLPREDNISOLONE 4 MG PO TBPK
ORAL_TABLET | ORAL | 0 refills | Status: DC
Start: 2022-06-24 — End: 2022-07-25

## 2022-06-24 NOTE — Assessment & Plan Note (Signed)
Acute on chronic, flare Reports PRN albuterol 4x/day or more Will send in for triple therapy inhaler to switch from advair if covered Discussed non pharm treatment like vicks vapo steamers for bathroom Repeat ABX and steroids Pt declined use of singulair given previously medication failure RTC as needed; encouraged to call pulm to let them know of ongoing flare

## 2022-06-25 ENCOUNTER — Other Ambulatory Visit: Payer: HMO

## 2022-06-25 DIAGNOSIS — E291 Testicular hypofunction: Secondary | ICD-10-CM

## 2022-06-25 DIAGNOSIS — N401 Enlarged prostate with lower urinary tract symptoms: Secondary | ICD-10-CM | POA: Diagnosis not present

## 2022-06-26 ENCOUNTER — Encounter: Payer: Self-pay | Admitting: *Deleted

## 2022-06-26 LAB — PSA: Prostate Specific Ag, Serum: 0.5 ng/mL (ref 0.0–4.0)

## 2022-06-26 LAB — TESTOSTERONE: Testosterone: 273 ng/dL (ref 264–916)

## 2022-06-26 LAB — HEMATOCRIT: Hematocrit: 37.4 % — ABNORMAL LOW (ref 37.5–51.0)

## 2022-06-30 ENCOUNTER — Telehealth: Payer: Self-pay | Admitting: Family Medicine

## 2022-06-30 ENCOUNTER — Other Ambulatory Visit: Payer: Self-pay

## 2022-06-30 DIAGNOSIS — I10 Essential (primary) hypertension: Secondary | ICD-10-CM

## 2022-06-30 MED ORDER — LOSARTAN POTASSIUM 100 MG PO TABS
100.0000 mg | ORAL_TABLET | Freq: Every day | ORAL | 1 refills | Status: DC
Start: 2022-06-30 — End: 2022-12-29

## 2022-06-30 NOTE — Telephone Encounter (Signed)
[facility] faxed refill request for the following medications:   losartan (COZAAR) 100 MG tablet     Please advise

## 2022-07-03 DIAGNOSIS — R0789 Other chest pain: Secondary | ICD-10-CM | POA: Diagnosis not present

## 2022-07-03 DIAGNOSIS — R55 Syncope and collapse: Secondary | ICD-10-CM | POA: Diagnosis not present

## 2022-07-14 ENCOUNTER — Telehealth (HOSPITAL_COMMUNITY): Payer: Self-pay | Admitting: *Deleted

## 2022-07-14 ENCOUNTER — Encounter (HOSPITAL_COMMUNITY): Payer: Self-pay

## 2022-07-14 ENCOUNTER — Encounter: Payer: Self-pay | Admitting: Family Medicine

## 2022-07-14 MED ORDER — METOPROLOL TARTRATE 100 MG PO TABS: ORAL_TABLET | ORAL | 0 refills | Status: DC

## 2022-07-14 NOTE — Telephone Encounter (Signed)
Reaching out to patient to offer assistance regarding upcoming cardiac imaging study; pt verbalizes understanding of appt date/time, parking situation and where to check in, pre-test NPO status and medications ordered, and verified current allergies; name and call back number provided for further questions should they arise  Larey Brick RN Navigator Cardiac Imaging [facility] [phone] office [phone] cell  Patient take 120mg  verapamil BID daily and will take 100mg  metoprolol tartrate two hours prior to his cardiac CT scan.

## 2022-07-15 ENCOUNTER — Ambulatory Visit: Payer: Self-pay | Admitting: *Deleted

## 2022-07-15 ENCOUNTER — Other Ambulatory Visit: Payer: Self-pay | Admitting: Family Medicine

## 2022-07-15 DIAGNOSIS — J4541 Moderate persistent asthma with (acute) exacerbation: Secondary | ICD-10-CM

## 2022-07-15 MED ORDER — TRELEGY ELLIPTA 200-62.5-25 MCG/ACT IN AEPB: 1.0000 | INHALATION_SPRAY | Freq: Every day | RESPIRATORY_TRACT | 11 refills | Status: DC

## 2022-07-15 NOTE — Telephone Encounter (Signed)
  Chief Complaint: need reorder for medication trelegy ellipta  Symptoms: na Frequency: na Pertinent Negatives: Patient denies na Disposition: [] ED /[] Urgent Care (no appt availability in office) / [] Appointment(In office/virtual)/ []  [place] [facility]/ [] Home Care/ [] Refused Recommended Disposition /[] [place] [facility]/ [x]  Follow-up with PCP Additional Notes:  Pharmacy staff from [facility] called to request clarification of medication and to reorder Rx. Trelegy ellipta. Ordered to inhale 1 each into the lungs in the morning and at bedtime on 06/24/22. Insurance will only pay for trelegy to be given once daily  per month supply. Please advise if another supplement inhaler needs to be ordered. Please notify [facility] .     Reason for Disposition  [1] Pharmacy calling with prescription question AND [2] triager unable to answer question  Answer Assessment - Initial Assessment Questions 1. NAME of MEDICINE: "What medicine(s) are you calling about?"     Trelegy ellipta 2. QUESTION: "What is your question?" (e.g., double dose of medicine, side effect)     Need order clarified or reordered due to insurance will not cover medication ordered twice a day.  3. PRESCRIBER: "Who prescribed the medicine?" Reason: if prescribed by specialist, call should be referred to that group.     E. Payne,FNP 06/24/22 4. SYMPTOMS: "Do you have any symptoms?" If Yes, ask: "What symptoms are you having?"  "How bad are the symptoms (e.g., mild, moderate, severe)     Na  5. PREGNANCY:  "Is there any chance that you are pregnant?" "When was your last menstrual period?"     na  Protocols used: Medication Question Call-A-AH

## 2022-07-15 NOTE — Telephone Encounter (Signed)
Trelegy dose sent to pharmacy for once daily dosing.

## 2022-07-16 ENCOUNTER — Telehealth: Payer: Self-pay

## 2022-07-16 NOTE — Telephone Encounter (Signed)
This is a question for clinical staff.  Front desk is not allowed to give out samples.

## 2022-07-16 NOTE — Telephone Encounter (Signed)
Copied from CRM [id number]. Topic: General - Other >> Jul 16, 2022 10:02 AM Dondra Prader A wrote: Reason for CRM: Pt states that he spoke with Merita Norton and was told to call the front desk and ask to see if there are any samples of Trelegy. Please advise.

## 2022-07-17 ENCOUNTER — Ambulatory Visit
Admission: RE | Admit: 2022-07-17 | Discharge: 2022-07-17 | Disposition: A | Discharge status: Home / Self Care | Payer: HMO | Source: Ambulatory Visit | Attending: Internal Medicine | Admitting: Internal Medicine

## 2022-07-17 ENCOUNTER — Telehealth: Payer: Self-pay

## 2022-07-17 DIAGNOSIS — R55 Syncope and collapse: Secondary | ICD-10-CM | POA: Insufficient documentation

## 2022-07-17 DIAGNOSIS — K449 Diaphragmatic hernia without obstruction or gangrene: Secondary | ICD-10-CM | POA: Insufficient documentation

## 2022-07-17 DIAGNOSIS — I2510 Atherosclerotic heart disease of native coronary artery without angina pectoris: Secondary | ICD-10-CM | POA: Diagnosis not present

## 2022-07-17 DIAGNOSIS — R0789 Other chest pain: Secondary | ICD-10-CM | POA: Diagnosis not present

## 2022-07-17 DIAGNOSIS — R072 Precordial pain: Secondary | ICD-10-CM

## 2022-07-17 DIAGNOSIS — K760 Fatty (change of) liver, not elsewhere classified: Secondary | ICD-10-CM | POA: Insufficient documentation

## 2022-07-17 MED ORDER — IOHEXOL 350 MG/ML SOLN
75.0000 mL | Freq: Once | INTRAVENOUS | Status: AC | PRN
  Administered 2022-07-17: 75 mL via INTRAVENOUS

## 2022-07-17 MED ORDER — SODIUM CHLORIDE 0.9 % IV BOLUS
500.0000 mL | Freq: Once | INTRAVENOUS | Status: AC
  Administered 2022-07-17: 500 mL via INTRAVENOUS

## 2022-07-17 MED ORDER — NITROGLYCERIN 0.4 MG SL SUBL
0.8000 mg | SUBLINGUAL_TABLET | Freq: Once | SUBLINGUAL | Status: AC
  Administered 2022-07-17: 0.8 mg via SUBLINGUAL

## 2022-07-17 MED ORDER — SCOPOLAMINE 1 MG/3DAYS TD PT72: 1.0000 | MEDICATED_PATCH | TRANSDERMAL | 2 refills | Status: DC

## 2022-07-17 NOTE — Addendum Note (Signed)
Addended by: Bing Neighbors on: 07/17/2022 03:51 PM   Modules accepted: Orders

## 2022-07-17 NOTE — Progress Notes (Signed)
Pt states that he is feeling much better and pt given  a cola to drink.

## 2022-07-17 NOTE — Telephone Encounter (Signed)
Prescription for scopolamine patches has been sent to patient's pharmacy   [name], MD  [facility]

## 2022-07-17 NOTE — Progress Notes (Signed)
Post ct scan pt states that he feels very dizzy and having the sensation of going to pass out, pt reclined back in the recliner with his feet up and normal saline bolus initiated

## 2022-07-17 NOTE — Telephone Encounter (Signed)
Copied from CRM [id number]. Topic: General - Other >> Jul 17, 2022 11:03 AM Dondra Prader A wrote: Reason for CRM: Pt states that he will be going on a cruise for 10 days the first of June and is needing a patch called into the pharmacy for motion sickness.

## 2022-07-17 NOTE — Progress Notes (Signed)
Patient tolerated procedure well. Ambulate w/o difficulty. Denies any lightheadedness or being dizzy. Pt denies any pain at this time. Sitting in chair, drinking water provided. Pt is encouraged to drink additional water throughout the day and reason explained to patient. Patient verbalized understanding and all questions answered. ABC intact. No further needs at this time. Discharge from procedure area w/o issues.  

## 2022-07-17 NOTE — Progress Notes (Signed)
Pt stood up and walked around without difficulty, pt denies dizziness and feeling faint at this time, states that he feels much better

## 2022-07-24 ENCOUNTER — Telehealth: Payer: Self-pay | Admitting: Student in an Organized Health Care Education/Training Program

## 2022-07-24 DIAGNOSIS — J4520 Mild intermittent asthma, uncomplicated: Secondary | ICD-10-CM

## 2022-07-24 NOTE — Telephone Encounter (Signed)
Pt called the office stating that he will be going out of the country June 1 and states he has a cough that he can't get rid of. Pt said he is coughing up white phlegm and also states that he has been wheezing.  Pt went to [facility] and had a cardiac CT performed which he states  Dr. Aundria Rud might want to look at it.  Pt is wanting to know if anything might be able to be prescribed to help with his symptoms as he is hoping to have relief before he goes out of the country.  Pt had Cefdinir prescribed by PCP and then had Augmentin prescribed after that which he said did help clear up the mucus as he was originally coughing up green phlegm but did not help fully get rid of the cough.  Preferred pharmacy is [facility] in [place] off [address] [address].   Dr. Aundria Rud, please advise on this for pt.

## 2022-07-24 NOTE — Telephone Encounter (Signed)
Patient is aware that Dr. Aundria Rud has left the office. He is okay with waiting until tomorrow for a response.

## 2022-07-25 DIAGNOSIS — S93602A Unspecified sprain of left foot, initial encounter: Secondary | ICD-10-CM | POA: Diagnosis not present

## 2022-07-25 MED ORDER — PREDNISONE 50 MG PO TABS
50.0000 mg | ORAL_TABLET | Freq: Every day | ORAL | 0 refills | Status: AC
Start: 2022-07-25 — End: 2022-08-01

## 2022-07-25 NOTE — Telephone Encounter (Signed)
ATC the patient. LVM for the patient to return my call. 

## 2022-07-25 NOTE — Telephone Encounter (Signed)
Spoke to patient and scheduled appt for 07/29/2022 at 4;15. Nothing further needed.

## 2022-07-29 ENCOUNTER — Encounter: Payer: Self-pay | Admitting: Student in an Organized Health Care Education/Training Program

## 2022-07-29 ENCOUNTER — Ambulatory Visit (INDEPENDENT_AMBULATORY_CARE_PROVIDER_SITE_OTHER): Payer: HMO | Admitting: Student in an Organized Health Care Education/Training Program

## 2022-07-29 VITALS — BP 142/78 | HR 70 | Temp 98.0°F | Ht 72.0 in | Wt 199.4 lb

## 2022-07-29 DIAGNOSIS — J4550 Severe persistent asthma, uncomplicated: Secondary | ICD-10-CM | POA: Diagnosis not present

## 2022-07-29 DIAGNOSIS — J479 Bronchiectasis, uncomplicated: Secondary | ICD-10-CM | POA: Diagnosis not present

## 2022-07-29 MED ORDER — CLOTRIMAZOLE 10 MG MT TROC
10.0000 mg | Freq: Every day | OROMUCOSAL | 0 refills | Status: AC
Start: 2022-07-29 — End: 2022-08-12

## 2022-07-29 MED ORDER — FLUTICASONE-SALMETEROL 500-50 MCG/ACT IN AEPB
1.0000 | INHALATION_SPRAY | Freq: Two times a day (BID) | RESPIRATORY_TRACT | 6 refills | Status: DC
Start: 2022-07-29 — End: 2023-02-27

## 2022-07-29 MED ORDER — PREDNISONE 50 MG PO TABS
50.0000 mg | ORAL_TABLET | Freq: Every day | ORAL | 0 refills | Status: AC
Start: 2022-07-29 — End: 2022-08-05

## 2022-07-29 MED ORDER — CEFPODOXIME PROXETIL 200 MG PO TABS
200.0000 mg | ORAL_TABLET | Freq: Two times a day (BID) | ORAL | 0 refills | Status: AC
Start: 2022-07-29 — End: 2022-08-05

## 2022-07-30 NOTE — Progress Notes (Signed)
Assessment & Plan:   1. Severe persistent asthma 2. Mild Bronchiectasis  [name] [name] is presenting for follow up on cough and asthma with recurrent exacerbations. He recently called in with symptoms of cough and wheeze and has received a short course of prednisone. His physical exam is benign today with no wheezing.    Workup has included a CBC (no eosinophilia, not present historically either), allergen testing (negative IgE, no allergens detected), and aspergillus testing (negative). PFT's show normal spirometry consistent with excellent control of asthma. Overall, his asthma is likely presenting as a Th1 phenotype. He also has recurrent aspiration secondary to reflux disease, with previous imaging showing RLL tree-in-bud nodularity. Most recent cardiac CT lung windows reviewed by me shows infiltrates in the bases, consistent with infectious etiology.  Given he's had an exacerbation, will be travelling to [place], and has recently required steroids, I will step up his therapy again, back to high dose Advair (increased from 250-50 to 500-50), until he is back. I will also stop his Trelegy as he does not have COPD and there is no role for LAMA therapy. I have also provided him with a script for Prednisone to use should his symptoms exacerbate while abroad. I also provided him with a script for clotrimazole should he develop oral thrush while abroad.  As to the infiltrates on the CT, and mild bronchiectasis noted on CT, this is directly related to his recurrent aspiration secondary to reflux. He is pending surgery, now scheduled for next fall. He did have induced sputa previously with cultures all returning negative. Given the exacerbation, I am concerned for a mild bronchiectasis flare on top of asthma exacerbation, and I will prescribe him a course of antibiotics. I have reviewed his history of antibiotic prescriptions, and he's received multiple courses of augmentin and previously cefdinir. He is  allergic to moxifloxacin and sulfa drugs, so bactrim and Levofloxacin are not suitable antibiotics. I will obtain repeat induced sputa to make sure there is no pseudomonas or a resistant organism, and will start the patient on a course of Cefpodoxime. Should this continue to happen, we will have to proceed with flexible bronchoscopy with BAL in the RLL to rule out any resistant organism.   - fluticasone-salmeterol (ADVAIR) 500-50 MCG/ACT AEPB; Inhale 1 puff into the lungs in the morning and at bedtime.  Dispense: 60 each; Refill: 6 - predniSONE (DELTASONE) 50 MG tablet; Take 1 tablet (50 mg total) by mouth daily with breakfast for 7 days.  Dispense: 7 tablet; Refill: 0 - clotrimazole (MYCELEX) 10 MG troche; Take 1 tablet (10 mg total) by mouth 5 (five) times daily for 14 days.  Dispense: 70 tablet; Refill: 0 - cefpodoxime (VANTIN) 200 MG tablet; Take 1 tablet (200 mg total) by mouth 2 (two) times daily for 7 days.  Dispense: 14 tablet; Refill: 0 - Sputum induction; Standing - Gram Stain w/Sputum Cult Rflx; Future  I spent 30 minutes caring for this patient today, including preparing to see the patient, obtaining a medical history , reviewing a separately obtained history, performing a medically appropriate examination and/or evaluation, counseling and educating the patient/family/caregiver, ordering medications, tests, or procedures, and documenting clinical information in the electronic health record  Raechel Chute, MD [facility] 07/30/2022 10:14 AM    End of visit medications:  Meds ordered this encounter  Medications   cefpodoxime (VANTIN) 200 MG tablet    Sig: Take 1 tablet (200 mg total) by mouth 2 (two) times daily for  7 days.    Dispense:  14 tablet    Refill:  0   fluticasone-salmeterol (ADVAIR) 500-50 MCG/ACT AEPB    Sig: Inhale 1 puff into the lungs in the morning and at bedtime.    Dispense:  60 each    Refill:  6   predniSONE (DELTASONE) 50 MG tablet     Sig: Take 1 tablet (50 mg total) by mouth daily with breakfast for 7 days.    Dispense:  7 tablet    Refill:  0   clotrimazole (MYCELEX) 10 MG troche    Sig: Take 1 tablet (10 mg total) by mouth 5 (five) times daily for 14 days.    Dispense:  70 tablet    Refill:  0     Current Outpatient Medications:    albuterol (VENTOLIN HFA) 108 (90 Base) MCG/ACT inhaler, Inhale 1-2 puffs into the lungs every 6 (six) hours as needed for wheezing or shortness of breath., Disp: 18 g, Rfl: 3   Calcium Carb-Cholecalciferol (CALCIUM 600+D) 600-800 MG-UNIT TABS, Take 1 tablet by mouth daily., Disp: , Rfl:    cefpodoxime (VANTIN) 200 MG tablet, Take 1 tablet (200 mg total) by mouth 2 (two) times daily for 7 days., Disp: 14 tablet, Rfl: 0   clotrimazole (MYCELEX) 10 MG troche, Take 1 tablet (10 mg total) by mouth 5 (five) times daily for 14 days., Disp: 70 tablet, Rfl: 0   dexlansoprazole (DEXILANT) 60 MG capsule, Take 1 capsule (60 mg total) by mouth daily., Disp: 90 capsule, Rfl: 1   dutasteride (AVODART) 0.5 MG capsule, Take 1 capsule (0.5 mg total) by mouth daily., Disp: 90 capsule, Rfl: 3   famotidine (PEPCID) 40 MG tablet, TAKE ONE TABLET BY MOUTH DAILY (Patient taking differently: Take 40 mg by mouth at bedtime.), Disp: 30 tablet, Rfl: 11   fluticasone (FLONASE) 50 MCG/ACT nasal spray, Place 1 spray into both nostrils daily., Disp: 18.2 mL, Rfl: 11   fluticasone-salmeterol (ADVAIR) 500-50 MCG/ACT AEPB, Inhale 1 puff into the lungs in the morning and at bedtime., Disp: 60 each, Rfl: 6   hydrochlorothiazide (MICROZIDE) 12.5 MG capsule, Take 1 capsule (12.5 mg total) by mouth daily., Disp: 90 capsule, Rfl: 1   ketotifen (ZADITOR) 0.025 % ophthalmic solution, Place 1 drop into both eyes daily. , Disp: , Rfl:    losartan (COZAAR) 100 MG tablet, Take 1 tablet (100 mg total) by mouth daily., Disp: 90 tablet, Rfl: 1   metoprolol tartrate (LOPRESSOR) 100 MG tablet, Take tablet (100mg ) TWO hours prior to your cardiac  CT scan., Disp: 1 tablet, Rfl: 0   Misc Natural Products (GLUCOSAMINE CHONDROITIN TRIPLE) TABS, Take 1 tablet by mouth 2 (two) times daily., Disp: , Rfl:    Multiple Vitamin (MULTI-VITAMINS) TABS, Take 1 tablet by mouth daily. , Disp: , Rfl:    Multiple Vitamins-Minerals (PRESERVISION AREDS 2) CAPS, Take 1 capsule by mouth 2 (two) times daily., Disp: , Rfl:    predniSONE (DELTASONE) 50 MG tablet, Take 1 tablet (50 mg total) by mouth daily with breakfast for 7 days., Disp: 7 tablet, Rfl: 0   predniSONE (DELTASONE) 50 MG tablet, Take 1 tablet (50 mg total) by mouth daily with breakfast for 7 days., Disp: 7 tablet, Rfl: 0   scopolamine (TRANSDERM-SCOP) 1 MG/3DAYS, Place 1 patch (1.5 mg total) onto the skin every 3 (three) days., Disp: 15 patch, Rfl: 2   tadalafil (CIALIS) 5 MG tablet, Take 1 tablet (5 mg total) by mouth at bedtime., Disp: 90 tablet,  Rfl: 3   tamsulosin (FLOMAX) 0.4 MG CAPS capsule, Take 1 capsule (0.4 mg total) by mouth daily., Disp: 90 capsule, Rfl: 3   Turmeric 500 MG CAPS, Take 500 mg by mouth 2 (two) times daily. , Disp: , Rfl:    verapamil (CALAN-SR) 240 MG CR tablet, TAKE ONE TABLET BY MOUTH TWICE A DAY, Disp: 180 tablet, Rfl: 1   guaiFENesin-codeine (ROBITUSSIN AC) 100-10 MG/5ML syrup, Take 5 mLs by mouth 3 (three) times daily as needed for cough. (Patient not taking: Reported on 07/29/2022), Disp: 120 mL, Rfl: 0   Subjective:   PATIENT ID: [name] [name] GENDER: male DOB: [birth year], MRN: [id number]  Chief Complaint  Patient presents with   Follow-up    Cough has improved since starting prednisone. Cough is still present and prod with green sputum.     HPI  [name] [name] is a pleasant 67 year old male presenting to clinic for follow up.  During our last visit, he reported that his symptoms were resolved and he was feeling well. At that point, we stepped down his advair therapy from 500-50 to 250-50. He felt well overall, but did call our office last week with  reports of cough and wheezing. We gave him a short course of steroids, and he continued to use his Advair. PCP also started Trelegy. He's felt well overall and his shortness of breath, wheeze, and cough have improved. He is travelling to [place] beginning of June and would like to know if there's anything else he should do prior to travelling. Patient has also had a couple of courses of antibiotics since his last visit with [place]. Furthermore, he has been in contact with his surgeons at [facility] for the fundoplication to schedule his surgery. The surgery is now delayed given he was on steroids, and is currently scheduled for the fall.   He was initially seen by me in September of 2023 for symptoms of cough productive of whitish (sometimes yellow) sputum, associated with shortness of breath, exertional dyspnea, and wheezing. He was diagnosed with asthma many years ago by Dr. Magnolia Callas. He has had multiple exacerbations of his asthma, requiring antibiotics and steroids.   He does not have any pets and is a non-smoker (thou reports previous second hand exposure). He does not have any chest pain or tightness, nor does he have any fevers, chills, night sweats, or weight loss. He is compliant with his medications and is using Advair twice daily.   He tells me he has a history of asthma as well as cough, and had previously been seen at a [facility]. Work up at [facility] was initiated by Renee Harder, MD. At that time, he was referred for low immunoglobulin levels. A high resolution CT chest in 2020 at [facility] suggested possible early bronchiectasis. He was referred to ENT and GI for further workup of sinusitis and reflux disease. Given concern for silent aspiration triggering his asthma, further workup was pursued. He was eventually referred to thoracic surgery after esophageal manometry showed a medium to large sized hiatal hernia. Barium swallow in January of 2023 didn't show a hiatal hernia but did show severe GERD with signs of  aspiration. He was offered robot assisted laparoscopic repair of his paraesophageal hernia and fundoplication.   He is a never smoker, but reports second hand exposure. He used to work at [employer] and retired recently. He continues to be active in his church. He reports significant mold exposure at [facility], and suspects there might be mold  exposure at church. He does not have any pets.  Ancillary information including prior medications, full medical/surgical/family/social histories, and PFTs (when available) are listed below and have been reviewed.   Review of Systems  Constitutional:  Negative for chills and fever.  Respiratory:  Positive for cough, sputum production and wheezing. Negative for shortness of breath.   Cardiovascular:  Negative for chest pain.  Skin:  Negative for rash.     Objective:   Vitals:   07/29/22 1622  BP: (!) 142/78  Pulse: 70  Temp: 98 F (36.7 C)  TempSrc: Temporal  SpO2: 97%  Weight: 199 lb 6.4 oz (90.4 kg)  Height: 6' (1.829 m)   97% on  RA BMI Readings from Last 3 Encounters:  07/29/22 27.04 kg/m  06/24/22 27.76 kg/m  06/18/22 27.61 kg/m   Wt Readings from Last 3 Encounters:  07/29/22 199 lb 6.4 oz (90.4 kg)  06/24/22 204 lb 11.2 oz (92.9 kg)  06/18/22 203 lb 9.6 oz (92.4 kg)    Physical Exam Constitutional:      Appearance: Normal appearance. He is normal weight.  HENT:     Head: Normocephalic.     Mouth/Throat:     Mouth: Mucous membranes are moist.  Cardiovascular:     Rate and Rhythm: Normal rate and regular rhythm.     Pulses: Normal pulses.     Heart sounds: Normal heart sounds.  Pulmonary:     Effort: Pulmonary effort is normal.     Breath sounds: No wheezing, rhonchi or rales.  Musculoskeletal:        General: Normal range of motion.     Cervical back: Normal range of motion and neck supple.  Skin:    General: Skin is warm.  Neurological:     General: No focal deficit present.     Mental Status: He is alert and  oriented to person, place, and time.     Ancillary Information    Past Medical History:  Diagnosis Date   Allergy    Asthma    BP (high blood pressure) 11/07/2014   Should stop combination of Tekturna (DRI)  and ARB    Closed fracture of lateral malleolus of left fibula 07/23/2020   Hypertension    HYPERTENSION, SEVERE 03/15/2007   Qualifier: Diagnosis of  By: Vernie Murders     Prostate enlargement    Sleep apnea    Status post total knee replacement using cement, left 04/01/2019   Stress fracture of tibia and fibula 07/18/2020     Family History  Problem Relation Age of Onset   Cancer Mother        ovarian   Glaucoma Mother    Cancer Father        prostate   Hypertension Sister    Asthma Sister    Alcohol abuse Paternal Uncle    Liver disease Paternal Uncle    Cancer - Prostate Paternal Uncle    Cancer Maternal Uncle        lung; two mat uncles   Cancer Paternal Grandmother        breast   Cancer Paternal Aunt        lung cancer in a smoker     Past Surgical History:  Procedure Laterality Date   APPENDECTOMY  1990   Dr Lemar Livings   COLONOSCOPY  2008   Dr Lemar Livings   COLONOSCOPY WITH PROPOFOL N/A 08/13/2016   Procedure: COLONOSCOPY WITH PROPOFOL;  Surgeon: Earline Mayotte, MD;  Location: [facility]  ENDOSCOPY;  Service: Endoscopy;  Laterality: N/A;   HEMORRHOID SURGERY     HERNIA REPAIR  01/20/14   umbilical hernia   KNEE ARTHROSCOPY Left    KNEE SURGERY Left 1986   NASAL SEPTUM SURGERY     TONSILLECTOMY     TOTAL KNEE ARTHROPLASTY Left 03/29/2019   Procedure: TOTAL KNEE ARTHROPLASTY;  Surgeon: Christena Flake, MD;  Location: [facility] ORS;  Service: Orthopedics;  Laterality: Left;   VASECTOMY      Social History   Socioeconomic History   Marital status: Married    Spouse name: Not on file   Number of children: Not on file   Years of education: Not on file   Highest education level: Master's degree (e.g., MA, MS, MEng, MEd, MSW, MBA)  Occupational History   Not  on file  Tobacco Use   Smoking status: Never   Smokeless tobacco: Never   Tobacco comments:    Exposed to second hand smoke as a child  Substance and Sexual Activity   Alcohol use: Yes    Alcohol/week: 0.0 standard drinks of alcohol    Comment: occasionally   Drug use: No   Sexual activity: Not on file  Other Topics Concern   Not on file  Social History Narrative   Not on file   Social Determinants of Health   Financial Resource Strain: Low Risk  (06/17/2022)   Overall Financial Resource Strain (CARDIA)    Difficulty of Paying Living Expenses: Not hard at all  Food Insecurity: No Food Insecurity (06/17/2022)   Hunger Vital Sign    Worried About Running Out of Food in the Last Year: Never true    Ran Out of Food in the Last Year: Never true  Transportation Needs: No Transportation Needs (06/17/2022)   PRAPARE - Administrator, Civil Service (Medical): No    Lack of Transportation (Non-Medical): No  Physical Activity: Insufficiently Active (06/17/2022)   Exercise Vital Sign    Days of Exercise per Week: 2 days    Minutes of Exercise per Session: 20 min  Stress: No Stress Concern Present (06/17/2022)   [employer] of Occupational Health - Occupational Stress Questionnaire    Feeling of Stress : Not at all  Social Connections: Socially Integrated (06/17/2022)   Social Connection and Isolation Panel [NHANES]    Frequency of Communication with Friends and Family: More than three times a week    Frequency of Social Gatherings with Friends and Family: More than three times a week    Attends Religious Services: More than 4 times per year    Active Member of [employer] or Organizations: Yes    Attends Engineer, structural: More than 4 times per year    Marital Status: Married  Catering manager Violence: Not on file     Allergies  Allergen Reactions   Hydralazine Hives and Shortness Of Breath    Chest tightness, severe headache, dyspnea Other reaction(s): Chest Pain,  Headache, Respiratory Distress   Gluten Meal Diarrhea    bloating   Moxifloxacin Hives    Avelox   Pseudoephedrine Other (See Comments)    Confusion, passed out, numbness   Sulfa Antibiotics Hives   Sulfonamide Derivatives Hives     CBC    Component Value Date/Time   WBC 5.8 02/26/2022 1003   WBC 12.3 (H) 02/01/2022 0944   RBC 4.01 (L) 02/26/2022 1003   RBC 4.05 (L) 02/01/2022 0944   HGB 12.1 (L) 02/26/2022 1003  HCT 37.4 (L) 06/25/2022 1023   PLT 237 02/26/2022 1003   MCV 93 02/26/2022 1003   MCH 30.2 02/26/2022 1003   MCH 31.4 02/01/2022 0944   MCHC 32.4 02/26/2022 1003   MCHC 33.7 02/01/2022 0944   RDW 13.0 02/26/2022 1003   LYMPHSABS 0.6 (L) 02/01/2022 0944   LYMPHSABS 2.2 11/20/2021 0850   MONOABS 0.7 02/01/2022 0944   EOSABS 0.0 02/01/2022 0944   EOSABS 0.1 11/20/2021 0850   BASOSABS 0.1 02/01/2022 0944   BASOSABS 0.1 11/20/2021 0850    Pulmonary Functions Testing Results:    Latest Ref Rng & Units 04/03/2022   11:30 AM  PFT Results  FVC-Pre L 5.13   FVC-Predicted Pre % 104   FVC-Post L 5.27   FVC-Predicted Post % 107   Pre FEV1/FVC % % 90   Post FEV1/FCV % % 90   FEV1-Pre L 4.61   FEV1-Predicted Pre % 126   FEV1-Post L 4.75   DLCO uncorrected ml/min/mmHg 35.99   DLCO UNC% % 127   DLVA Predicted % 107   TLC L 6.80   TLC % Predicted % 91   RV % Predicted % 78     Outpatient Medications Prior to Visit  Medication Sig Dispense Refill   albuterol (VENTOLIN HFA) 108 (90 Base) MCG/ACT inhaler Inhale 1-2 puffs into the lungs every 6 (six) hours as needed for wheezing or shortness of breath. 18 g 3   Calcium Carb-Cholecalciferol (CALCIUM 600+D) 600-800 MG-UNIT TABS Take 1 tablet by mouth daily.     dexlansoprazole (DEXILANT) 60 MG capsule Take 1 capsule (60 mg total) by mouth daily. 90 capsule 1   dutasteride (AVODART) 0.5 MG capsule Take 1 capsule (0.5 mg total) by mouth daily. 90 capsule 3   famotidine (PEPCID) 40 MG tablet TAKE ONE TABLET BY MOUTH  DAILY (Patient taking differently: Take 40 mg by mouth at bedtime.) 30 tablet 11   fluticasone (FLONASE) 50 MCG/ACT nasal spray Place 1 spray into both nostrils daily. 18.2 mL 11   hydrochlorothiazide (MICROZIDE) 12.5 MG capsule Take 1 capsule (12.5 mg total) by mouth daily. 90 capsule 1   ketotifen (ZADITOR) 0.025 % ophthalmic solution Place 1 drop into both eyes daily.      losartan (COZAAR) 100 MG tablet Take 1 tablet (100 mg total) by mouth daily. 90 tablet 1   metoprolol tartrate (LOPRESSOR) 100 MG tablet Take tablet (100mg ) TWO hours prior to your cardiac CT scan. 1 tablet 0   Misc Natural Products (GLUCOSAMINE CHONDROITIN TRIPLE) TABS Take 1 tablet by mouth 2 (two) times daily.     Multiple Vitamin (MULTI-VITAMINS) TABS Take 1 tablet by mouth daily.      Multiple Vitamins-Minerals (PRESERVISION AREDS 2) CAPS Take 1 capsule by mouth 2 (two) times daily.     predniSONE (DELTASONE) 50 MG tablet Take 1 tablet (50 mg total) by mouth daily with breakfast for 7 days. 7 tablet 0   scopolamine (TRANSDERM-SCOP) 1 MG/3DAYS Place 1 patch (1.5 mg total) onto the skin every 3 (three) days. 15 patch 2   tadalafil (CIALIS) 5 MG tablet Take 1 tablet (5 mg total) by mouth at bedtime. 90 tablet 3   tamsulosin (FLOMAX) 0.4 MG CAPS capsule Take 1 capsule (0.4 mg total) by mouth daily. 90 capsule 3   Turmeric 500 MG CAPS Take 500 mg by mouth 2 (two) times daily.      verapamil (CALAN-SR) 240 MG CR tablet TAKE ONE TABLET BY MOUTH TWICE A DAY 180 tablet  1   amoxicillin-clavulanate (AUGMENTIN) 875-125 MG tablet Take 1 tablet by mouth 2 (two) times daily. 14 tablet 0   fluticasone-salmeterol (ADVAIR) 250-50 MCG/ACT AEPB Inhale 1 puff into the lungs in the morning and at bedtime. 60 each 11   Fluticasone-Umeclidin-Vilant (TRELEGY ELLIPTA) 200-62.5-25 MCG/ACT AEPB Inhale 1 each into the lungs daily. 60 each 11   guaiFENesin-codeine (ROBITUSSIN AC) 100-10 MG/5ML syrup Take 5 mLs by mouth 3 (three) times daily as needed  for cough. (Patient not taking: Reported on 07/29/2022) 120 mL 0   benzonatate (TESSALON) 200 MG capsule Take 1 capsule (200 mg total) by mouth 2 (two) times daily as needed for cough. (Patient not taking: Reported on 07/29/2022) 20 capsule 2   No facility-administered medications prior to visit.

## 2022-07-31 ENCOUNTER — Other Ambulatory Visit
Admission: RE | Admit: 2022-07-31 | Discharge: 2022-07-31 | Disposition: A | Discharge status: Home / Self Care | Payer: HMO | Attending: Student in an Organized Health Care Education/Training Program | Admitting: Student in an Organized Health Care Education/Training Program

## 2022-07-31 ENCOUNTER — Telehealth: Payer: Self-pay | Admitting: Student in an Organized Health Care Education/Training Program

## 2022-07-31 DIAGNOSIS — K219 Gastro-esophageal reflux disease without esophagitis: Secondary | ICD-10-CM | POA: Diagnosis not present

## 2022-07-31 DIAGNOSIS — I10 Essential (primary) hypertension: Secondary | ICD-10-CM | POA: Diagnosis not present

## 2022-07-31 DIAGNOSIS — R55 Syncope and collapse: Secondary | ICD-10-CM | POA: Diagnosis not present

## 2022-07-31 DIAGNOSIS — J4540 Moderate persistent asthma, uncomplicated: Secondary | ICD-10-CM | POA: Diagnosis not present

## 2022-07-31 DIAGNOSIS — J479 Bronchiectasis, uncomplicated: Secondary | ICD-10-CM | POA: Insufficient documentation

## 2022-07-31 DIAGNOSIS — J4550 Severe persistent asthma, uncomplicated: Secondary | ICD-10-CM | POA: Insufficient documentation

## 2022-07-31 DIAGNOSIS — G4733 Obstructive sleep apnea (adult) (pediatric): Secondary | ICD-10-CM | POA: Diagnosis not present

## 2022-07-31 DIAGNOSIS — R0789 Other chest pain: Secondary | ICD-10-CM | POA: Diagnosis not present

## 2022-07-31 DIAGNOSIS — R002 Palpitations: Secondary | ICD-10-CM | POA: Diagnosis not present

## 2022-07-31 LAB — EXPECTORATED SPUTUM ASSESSMENT W GRAM STAIN, RFLX TO RESP C

## 2022-07-31 NOTE — Telephone Encounter (Signed)
Patient had a Sputum Induction done today. I notified the patient that he will have to have it done again. He said he is leaving to go out of town next Friday.   Synetta Fail-  Can you get it rescheduled and contact the patient. Thank you!

## 2022-07-31 NOTE — Telephone Encounter (Signed)
Patient is returning phone call. Patient at [facility] now. Patient phone number is [phone].

## 2022-07-31 NOTE — Telephone Encounter (Signed)
ATC the patient. LVM for the patient to return my call. 

## 2022-07-31 NOTE — Telephone Encounter (Signed)
Sputum culture taken is not acceptable

## 2022-08-01 DIAGNOSIS — S96912D Strain of unspecified muscle and tendon at ankle and foot level, left foot, subsequent encounter: Secondary | ICD-10-CM | POA: Diagnosis not present

## 2022-08-01 DIAGNOSIS — M79672 Pain in left foot: Secondary | ICD-10-CM | POA: Diagnosis not present

## 2022-08-02 LAB — ACID FAST SMEAR (AFB, MYCOBACTERIA): Acid Fast Smear: NEGATIVE

## 2022-08-07 LAB — FUNGUS CULTURE WITH STAIN

## 2022-08-07 LAB — FUNGUS CULTURE RESULT

## 2022-08-28 DIAGNOSIS — B078 Other viral warts: Secondary | ICD-10-CM | POA: Diagnosis not present

## 2022-08-28 DIAGNOSIS — R238 Other skin changes: Secondary | ICD-10-CM | POA: Diagnosis not present

## 2022-08-28 DIAGNOSIS — L570 Actinic keratosis: Secondary | ICD-10-CM | POA: Diagnosis not present

## 2022-08-29 ENCOUNTER — Ambulatory Visit
Admission: EM | Admit: 2022-08-29 | Discharge: 2022-08-29 | Disposition: A | Discharge status: Home / Self Care | Payer: HMO | Attending: Urgent Care | Admitting: Urgent Care

## 2022-08-29 DIAGNOSIS — J4541 Moderate persistent asthma with (acute) exacerbation: Secondary | ICD-10-CM | POA: Diagnosis not present

## 2022-08-29 DIAGNOSIS — R051 Acute cough: Secondary | ICD-10-CM | POA: Diagnosis not present

## 2022-08-29 MED ORDER — AMOXICILLIN-POT CLAVULANATE 875-125 MG PO TABS
1.0000 | ORAL_TABLET | Freq: Two times a day (BID) | ORAL | 0 refills | Status: DC
Start: 2022-08-29 — End: 2022-09-19

## 2022-08-29 MED ORDER — AZITHROMYCIN 250 MG PO TABS
ORAL_TABLET | ORAL | 0 refills | Status: DC
Start: 2022-08-29 — End: 2022-09-19

## 2022-08-29 MED ORDER — PREDNISONE 50 MG PO TABS
50.0000 mg | ORAL_TABLET | Freq: Every day | ORAL | 0 refills | Status: AC
Start: 2022-08-29 — End: 2022-09-05

## 2022-08-29 NOTE — ED Triage Notes (Signed)
Productive cough since Monday. Denies fever

## 2022-08-29 NOTE — ED Provider Notes (Signed)
[facility]    CSN: [id number] Arrival date & time: 08/29/22  1321      History   Chief Complaint Chief Complaint  Patient presents with   Cough    HPI [name] [name] is a 67 y.o. male.    Cough   Presents to urgent care with complaint of productive cough x 4 days.  He denies fever.  PMH is significant for asthma, esophageal reflux, chronic cough.  He endorses concern for "bronchitis" and states that he frequently gets that illness.  Past Medical History:  Diagnosis Date   Allergy    Asthma    BP (high blood pressure) 11/07/2014   Should stop combination of Tekturna (DRI)  and ARB    Closed fracture of lateral malleolus of left fibula 07/23/2020   Hypertension    HYPERTENSION, SEVERE 03/15/2007   Qualifier: Diagnosis of  By: Vernie Murders     Prostate enlargement    Sleep apnea    Status post total knee replacement using cement, left 04/01/2019   Stress fracture of tibia and fibula 07/18/2020    Patient Active Problem List   Diagnosis Date Noted   Moderate persistent asthma with exacerbation 06/24/2022   Syncope and collapse 06/18/2022   Hiatal hernia 03/20/2022   Refractory chronic cough 01/02/2022   Essential hypertension 12/19/2021   Severe persistent asthma 09/26/2021   Osteopenia 07/23/2020   Vitamin D deficiency 07/23/2020   Erectile dysfunction due to arterial insufficiency 07/15/2018   Primary osteoarthritis of left knee 12/10/2015   Allergic rhinitis 11/07/2014   Benign prostatic hyperplasia with lower urinary tract symptoms 11/07/2014   Hypogonadism in male 11/07/2014   Obstructive apnea 11/07/2014   Plantar fasciitis 11/07/2014   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   Esophageal reflux 03/15/2007   COUGH, CHRONIC 03/15/2007    Past Surgical History:  Procedure Laterality Date   APPENDECTOMY  1990   Dr Lemar Livings   COLONOSCOPY  2008   Dr Lemar Livings   COLONOSCOPY WITH PROPOFOL N/A 08/13/2016    Procedure: COLONOSCOPY WITH PROPOFOL;  Surgeon: Earline Mayotte, MD;  Location: [facility] ENDOSCOPY;  Service: Endoscopy;  Laterality: N/A;   HEMORRHOID SURGERY     HERNIA REPAIR  01/20/14   umbilical hernia   KNEE ARTHROSCOPY Left    KNEE SURGERY Left 1986   NASAL SEPTUM SURGERY     TONSILLECTOMY     TOTAL KNEE ARTHROPLASTY Left 03/29/2019   Procedure: TOTAL KNEE ARTHROPLASTY;  Surgeon: Christena Flake, MD;  Location: [facility] ORS;  Service: Orthopedics;  Laterality: Left;   VASECTOMY         Home Medications    Prior to Admission medications   Medication Sig Start Date End Date Taking? Authorizing Provider  albuterol (VENTOLIN HFA) 108 (90 Base) MCG/ACT inhaler Inhale 1-2 puffs into the lungs every 6 (six) hours as needed for wheezing or shortness of breath. 02/10/22  Yes Simmons-Robinson, Makiera, MD  Calcium Carb-Cholecalciferol (CALCIUM 600+D) 600-800 MG-UNIT TABS Take 1 tablet by mouth daily.   Yes [provider]  dexlansoprazole (DEXILANT) 60 MG capsule Take 1 capsule (60 mg total) by mouth daily. 06/09/22  Yes Simmons-Robinson, Makiera, MD  dutasteride (AVODART) 0.5 MG capsule Take 1 capsule (0.5 mg total) by mouth daily. 09/18/21  Yes Stoioff, Verna Czech, MD  famotidine (PEPCID) 40 MG tablet TAKE ONE TABLET BY MOUTH DAILY Patient taking differently: Take 40 mg by mouth at bedtime. 04/28/18  Yes Bosie Clos, MD  fluticasone-salmeterol (ADVAIR) 500-50  MCG/ACT AEPB Inhale 1 puff into the lungs in the morning and at bedtime. 07/29/22  Yes Dgayli, Lianne Bushy, MD  guaiFENesin-codeine (ROBITUSSIN AC) 100-10 MG/5ML syrup Take 5 mLs by mouth 3 (three) times daily as needed for cough. 06/24/22  Yes Jacky Kindle, FNP  hydrochlorothiazide (MICROZIDE) 12.5 MG capsule Take 1 capsule (12.5 mg total) by mouth daily. 06/18/22  Yes Simmons-Robinson, Makiera, MD  ketotifen (ZADITOR) 0.025 % ophthalmic solution Place 1 drop into both eyes daily.    Yes [provider]  losartan (COZAAR) 100 MG  tablet Take 1 tablet (100 mg total) by mouth daily. 06/30/22  Yes Simmons-Robinson, Makiera, MD  metoprolol tartrate (LOPRESSOR) 100 MG tablet Take tablet (100mg ) TWO hours prior to your cardiac CT scan. 07/14/22  Yes Agbor-Etang, Arlys John, MD  Misc Natural Products (GLUCOSAMINE CHONDROITIN TRIPLE) TABS Take 1 tablet by mouth 2 (two) times daily.   Yes [provider]  Multiple Vitamin (MULTI-VITAMINS) TABS Take 1 tablet by mouth daily.    Yes [provider]  Multiple Vitamins-Minerals (PRESERVISION AREDS 2) CAPS Take 1 capsule by mouth 2 (two) times daily.   Yes [provider]  scopolamine (TRANSDERM-SCOP) 1 MG/3DAYS Place 1 patch (1.5 mg total) onto the skin every 3 (three) days. 07/17/22  Yes Simmons-Robinson, Makiera, MD  tadalafil (CIALIS) 5 MG tablet Take 1 tablet (5 mg total) by mouth at bedtime. 09/18/21  Yes Stoioff, Verna Czech, MD  tamsulosin (FLOMAX) 0.4 MG CAPS capsule Take 1 capsule (0.4 mg total) by mouth daily. 09/18/21  Yes Stoioff, Verna Czech, MD  Turmeric 500 MG CAPS Take 500 mg by mouth 2 (two) times daily.    Yes [provider]  verapamil (CALAN-SR) 240 MG CR tablet TAKE ONE TABLET BY MOUTH TWICE A DAY 04/08/22  Yes Simmons-Robinson, Makiera, MD  fluticasone (FLONASE) 50 MCG/ACT nasal spray Place 1 spray into both nostrils daily. 11/19/21 11/19/22  Raechel Chute, MD    Family History Family History  Problem Relation Age of Onset   Cancer Mother        ovarian   Glaucoma Mother    Cancer Father        prostate   Hypertension Sister    Asthma Sister    Alcohol abuse Paternal Uncle    Liver disease Paternal Uncle    Cancer - Prostate Paternal Uncle    Cancer Maternal Uncle        lung; two mat uncles   Cancer Paternal Grandmother        breast   Cancer Paternal Aunt        lung cancer in a smoker    Social History Social History   Tobacco Use   Smoking status: Never   Smokeless tobacco: Never   Tobacco comments:    Exposed to second hand  smoke as a child  Substance Use Topics   Alcohol use: Yes    Alcohol/week: 0.0 standard drinks of alcohol    Comment: occasionally   Drug use: No     Allergies   Hydralazine, Gluten meal, Moxifloxacin, Pseudoephedrine, Sulfa antibiotics, and Sulfonamide derivatives   Review of Systems Review of Systems  Respiratory:  Positive for cough.      Physical Exam Triage Vital Signs ED Triage Vitals  Enc Vitals Group     BP 08/29/22 1343 (!) 148/70     Pulse Rate 08/29/22 1343 81     Resp 08/29/22 1343 20     Temp 08/29/22 1343 99.2 F (37.3 C)  Temp src --      SpO2 08/29/22 1343 96 %     Weight --      Height --      Head Circumference --      Peak Flow --      Pain Score 08/29/22 1342 3     Pain Loc --      Pain Edu? --      Excl. in GC? --    No data found.  Updated Vital Signs BP (!) 148/70   Pulse 81   Temp 99.2 F (37.3 C)   Resp 20   SpO2 96%   Visual Acuity Right Eye Distance:   Left Eye Distance:   Bilateral Distance:    Right Eye Near:   Left Eye Near:    Bilateral Near:     Physical Exam Vitals reviewed.  Constitutional:      Appearance: He is ill-appearing.  Cardiovascular:     Rate and Rhythm: Normal rate and regular rhythm.     Pulses: Normal pulses.     Heart sounds: Normal heart sounds.  Pulmonary:     Effort: Pulmonary effort is normal.     Breath sounds: Wheezing and rhonchi present.  Skin:    General: Skin is warm and dry.  Neurological:     General: No focal deficit present.     Mental Status: He is alert and oriented to person, place, and time.  Psychiatric:        Mood and Affect: Mood normal.        Behavior: Behavior normal.      UC Treatments / Results  Labs (all labs ordered are listed, but only abnormal results are displayed) Labs Reviewed - No data to display  EKG   Radiology No results found.  Procedures Procedures (including critical care time)  Medications Ordered in UC Medications - No data to  display  Initial Impression / Assessment and Plan / UC Course  I have reviewed the triage vital signs and the nursing notes.  Pertinent labs & imaging results that were available during my care of the patient were reviewed by me and considered in my medical decision making (see chart for details).   [name] [name] is a 67 y.o. male presenting with acute cough. Patient is afebrile without recent antipyretics, satting well on room air. Overall is ill appearing though non-toxic, well hydrated, without respiratory distress. Pulmonary exam is remarkable for wheezes and rhonchi. RRR with no murmurs, rubs, gallops.  Reviewed relevant chart history.   Patient's symptoms are most likely of viral etiology given their short duration, however given his past medical history, he is at risk for poor outcome.  Treating symptoms with a course of Augmentin and azithromycin along with prednisone which he has previously tolerated.  Counseled patient on potential for adverse effects with medications prescribed/recommended today, ER and return-to-clinic precautions discussed, patient verbalized understanding and agreement with care plan.  Final Clinical Impressions(s) / UC Diagnoses   Final diagnoses:  None   Discharge Instructions   None    ED Prescriptions   None    PDMP not reviewed this encounter.   Charma Igo, [place] 08/29/22 1419

## 2022-08-29 NOTE — Discharge Instructions (Signed)
Follow up here or with your primary care provider if your symptoms are worsening or not improving.     

## 2022-09-01 ENCOUNTER — Telehealth: Payer: Self-pay

## 2022-09-01 ENCOUNTER — Ambulatory Visit (INDEPENDENT_AMBULATORY_CARE_PROVIDER_SITE_OTHER): Payer: HMO | Admitting: Student in an Organized Health Care Education/Training Program

## 2022-09-01 ENCOUNTER — Encounter: Payer: Self-pay | Admitting: Student in an Organized Health Care Education/Training Program

## 2022-09-01 VITALS — BP 124/80 | HR 77 | Temp 97.8°F | Ht 72.0 in | Wt 197.2 lb

## 2022-09-01 DIAGNOSIS — J4550 Severe persistent asthma, uncomplicated: Secondary | ICD-10-CM | POA: Diagnosis not present

## 2022-09-01 NOTE — Progress Notes (Signed)
Assessment & Plan:   1. Severe persistent asthma, unspecified whether complicated  [name] [name] is presenting for follow up on severe persistent asthma with recurrent exacerbations. He had another exacerbation this month and was given another course of steroids.  He overall has a T-helper cell type 1 profile, with no eosinophilia noted on previous blood work, negative allergen testing (negative IgE, no allergens detected), negative aspergillus testing, and PFT's with normal spirometry. He unfortunately continues to have recurrent exacerbations, likely in the setting of reflux disease that is poorly controlled.   I had stepped up his therapy last visit to high dose ICS (500-50) and he again had an exacerbation. At this point, I believe that the next step in therapy is going to be a biologic. Reviewed all his data, and given elevated FENO of 38 ppb today and the T-helper cell type 1 inflammation, I believe that Tezepelumab is the ideal choice for his asthma. We will initiate the process to get him on Tezepelumab.   As to the infiltrates on the CT, and mild bronchiectasis noted on CT, this is directly related to his recurrent aspiration secondary to reflux. He is pending surgery, now scheduled for next fall. He did have induced sputa previously with cultures all returning negative. Return induced sputum grew candida albicans which we will ignore as a contaminant.   -Continue Advair 500-50 one puff twice daily -Start subcutaneous Tezepelumab 210 mg once every 4 weeks.  Return in about 3 months (around 12/02/2022).  I spent 30 minutes caring for this patient today, including preparing to see the patient, obtaining a medical history , reviewing a separately obtained history, performing a medically appropriate examination and/or evaluation, counseling and educating the patient/family/caregiver, ordering medications, tests, or procedures, and documenting clinical information in the electronic health  record  Raechel Chute, MD  Pulmonary Critical Care 09/01/2022 11:55 AM    End of visit medications:  No orders of the defined types were placed in this encounter.    Current Outpatient Medications:    albuterol (VENTOLIN HFA) 108 (90 Base) MCG/ACT inhaler, Inhale 1-2 puffs into the lungs every 6 (six) hours as needed for wheezing or shortness of breath., Disp: 18 g, Rfl: 3   amoxicillin-clavulanate (AUGMENTIN) 875-125 MG tablet, Take 1 tablet by mouth every 12 (twelve) hours., Disp: 14 tablet, Rfl: 0   azithromycin (ZITHROMAX Z-PAK) 250 MG tablet, Take 2 tablets (500 mg) today, then 1 tablet (250 mg) for next 4 days., Disp: 6 tablet, Rfl: 0   Calcium Carb-Cholecalciferol (CALCIUM 600+D) 600-800 MG-UNIT TABS, Take 1 tablet by mouth daily., Disp: , Rfl:    dexlansoprazole (DEXILANT) 60 MG capsule, Take 1 capsule (60 mg total) by mouth daily., Disp: 90 capsule, Rfl: 1   dutasteride (AVODART) 0.5 MG capsule, Take 1 capsule (0.5 mg total) by mouth daily., Disp: 90 capsule, Rfl: 3   famotidine (PEPCID) 40 MG tablet, TAKE ONE TABLET BY MOUTH DAILY (Patient taking differently: Take 40 mg by mouth at bedtime.), Disp: 30 tablet, Rfl: 11   fluticasone (FLONASE) 50 MCG/ACT nasal spray, Place 1 spray into both nostrils daily., Disp: 18.2 mL, Rfl: 11   fluticasone-salmeterol (ADVAIR) 500-50 MCG/ACT AEPB, Inhale 1 puff into the lungs in the morning and at bedtime., Disp: 60 each, Rfl: 6   guaiFENesin-codeine (ROBITUSSIN AC) 100-10 MG/5ML syrup, Take 5 mLs by mouth 3 (three) times daily as needed for cough., Disp: 120 mL, Rfl: 0   hydrochlorothiazide (MICROZIDE) 12.5 MG capsule, Take 1 capsule (12.5 mg total)  by mouth daily., Disp: 90 capsule, Rfl: 1   ketotifen (ZADITOR) 0.025 % ophthalmic solution, Place 1 drop into both eyes daily. , Disp: , Rfl:    losartan (COZAAR) 100 MG tablet, Take 1 tablet (100 mg total) by mouth daily., Disp: 90 tablet, Rfl: 1   Misc Natural Products (GLUCOSAMINE  CHONDROITIN TRIPLE) TABS, Take 1 tablet by mouth 2 (two) times daily., Disp: , Rfl:    Multiple Vitamin (MULTI-VITAMINS) TABS, Take 1 tablet by mouth daily. , Disp: , Rfl:    Multiple Vitamins-Minerals (PRESERVISION AREDS 2) CAPS, Take 1 capsule by mouth 2 (two) times daily., Disp: , Rfl:    predniSONE (DELTASONE) 50 MG tablet, Take 1 tablet (50 mg total) by mouth daily with breakfast for 7 days., Disp: 7 tablet, Rfl: 0   scopolamine (TRANSDERM-SCOP) 1 MG/3DAYS, Place 1 patch (1.5 mg total) onto the skin every 3 (three) days., Disp: 15 patch, Rfl: 2   tadalafil (CIALIS) 5 MG tablet, Take 1 tablet (5 mg total) by mouth at bedtime., Disp: 90 tablet, Rfl: 3   tamsulosin (FLOMAX) 0.4 MG CAPS capsule, Take 1 capsule (0.4 mg total) by mouth daily., Disp: 90 capsule, Rfl: 3   Turmeric 500 MG CAPS, Take 500 mg by mouth 2 (two) times daily. , Disp: , Rfl:    verapamil (CALAN-SR) 240 MG CR tablet, TAKE ONE TABLET BY MOUTH TWICE A DAY, Disp: 180 tablet, Rfl: 1   metoprolol tartrate (LOPRESSOR) 100 MG tablet, Take tablet (100mg ) TWO hours prior to your cardiac CT scan. (Patient not taking: Reported on 09/01/2022), Disp: 1 tablet, Rfl: 0   Subjective:   PATIENT ID: [name] [name] GENDER: male DOB: [birth year], MRN: [id number]  Chief Complaint  Patient presents with   Follow-up    Antony Blackbird at [facility] 06/19-- prescribed Zpak, Augmentin and prednisone.  Prod cough with light green sputum and wheezing.     HPI  [name] [name] is a pleasant 67 year old male presenting to clinic for follow up.   During a previous visit, he reported that his symptoms were resolved and he was feeling well. Following step down in therapy he did have an exacerbation and I did step him back up to 500-50 on the Advair. He unfortunately had another exacerbation recently and was just seen in urgent care where he was prescribed another course of steroids and antibiotics. He continues to have a cough. He did not make his cruise due to  flight delays.  He has been in contact with his surgeons at [facility] for the fundoplication to schedule his surgery. He is planning on having the surgery in September or October.   He was initially seen by me in September of 2023 for symptoms of cough productive of whitish (sometimes yellow) sputum, associated with shortness of breath, exertional dyspnea, and wheezing. He was diagnosed with asthma many years ago by Dr. [place] Callas. He has had multiple exacerbations of his asthma, requiring antibiotics and steroids.   He does not have any pets and is a non-smoker (thou reports previous second hand exposure). He does not have any chest pain or tightness, nor does he have any fevers, chills, night sweats, or weight loss. He is compliant with his medications and is using Advair twice daily.   He tells me he has a history of asthma as well as cough, and had previously been seen at a [facility]. Work up at [facility] was initiated by Renee Harder, MD. At that time, he was referred for low immunoglobulin levels.  A high resolution CT chest in 2020 at [facility] suggested possible early bronchiectasis. He was referred to ENT and GI for further workup of sinusitis and reflux disease. Given concern for silent aspiration triggering his asthma, further workup was pursued. He was eventually referred to thoracic surgery after esophageal manometry showed a medium to large sized hiatal hernia. Barium swallow in January of 2023 didn't show a hiatal hernia but did show severe GERD with signs of aspiration. He was offered robot assisted laparoscopic repair of his paraesophageal hernia and fundoplication.   He is a never smoker, but reports second hand exposure. He used to work at [employer] and retired recently. He continues to be active in his church. He reports significant mold exposure at [facility], and suspects there might be mold exposure at church. He does not have any pets.    Ancillary information including prior medications, full  medical/surgical/family/social histories, and PFTs (when available) are listed below and have been reviewed.   Review of Systems  Constitutional:  Negative for chills and fever.  Respiratory:  Positive for cough, sputum production and wheezing. Negative for shortness of breath.   Cardiovascular:  Negative for chest pain.  Skin:  Negative for rash.     Objective:   Vitals:   09/01/22 1134  BP: 124/80  Pulse: 77  Temp: 97.8 F (36.6 C)  TempSrc: Temporal  SpO2: 95%  Weight: 197 lb 3.2 oz (89.4 kg)  Height: 6' (1.829 m)   95% on RA BMI Readings from Last 3 Encounters:  09/01/22 26.75 kg/m  07/29/22 27.04 kg/m  06/24/22 27.76 kg/m   Wt Readings from Last 3 Encounters:  09/01/22 197 lb 3.2 oz (89.4 kg)  07/29/22 199 lb 6.4 oz (90.4 kg)  06/24/22 204 lb 11.2 oz (92.9 kg)    Physical Exam Constitutional:      Appearance: Normal appearance. He is normal weight.  HENT:     Head: Normocephalic.     Mouth/Throat:     Mouth: Mucous membranes are moist.  Cardiovascular:     Rate and Rhythm: Normal rate and regular rhythm.     Pulses: Normal pulses.     Heart sounds: Normal heart sounds.  Pulmonary:     Effort: Pulmonary effort is normal.     Breath sounds: Wheezing and rhonchi present. No rales.  Musculoskeletal:        General: Normal range of motion.     Cervical back: Normal range of motion and neck supple.  Skin:    General: Skin is warm.  Neurological:     General: No focal deficit present.     Mental Status: He is alert and oriented to person, place, and time.     Ancillary Information    Past Medical History:  Diagnosis Date   Allergy    Asthma    BP (high blood pressure) 11/07/2014   Should stop combination of Tekturna (DRI)  and ARB    Closed fracture of lateral malleolus of left fibula 07/23/2020   Hypertension    HYPERTENSION, SEVERE 03/15/2007   Qualifier: Diagnosis of  By: Vernie Murders     Prostate enlargement    Sleep apnea    Status  post total knee replacement using cement, left 04/01/2019   Stress fracture of tibia and fibula 07/18/2020     Family History  Problem Relation Age of Onset   Cancer Mother        ovarian   Glaucoma Mother    Cancer Father  prostate   Hypertension Sister    Asthma Sister    Alcohol abuse Paternal Uncle    Liver disease Paternal Uncle    Cancer - Prostate Paternal Uncle    Cancer Maternal Uncle        lung; two mat uncles   Cancer Paternal Grandmother        breast   Cancer Paternal Aunt        lung cancer in a smoker     Past Surgical History:  Procedure Laterality Date   APPENDECTOMY  1990   Dr Lemar Livings   COLONOSCOPY  2008   Dr Lemar Livings   COLONOSCOPY WITH PROPOFOL N/A 08/13/2016   Procedure: COLONOSCOPY WITH PROPOFOL;  Surgeon: Earline Mayotte, MD;  Location: [facility] ENDOSCOPY;  Service: Endoscopy;  Laterality: N/A;   HEMORRHOID SURGERY     HERNIA REPAIR  01/20/14   umbilical hernia   KNEE ARTHROSCOPY Left    KNEE SURGERY Left 1986   NASAL SEPTUM SURGERY     TONSILLECTOMY     TOTAL KNEE ARTHROPLASTY Left 03/29/2019   Procedure: TOTAL KNEE ARTHROPLASTY;  Surgeon: Christena Flake, MD;  Location: [facility] ORS;  Service: Orthopedics;  Laterality: Left;   VASECTOMY      Social History   Socioeconomic History   Marital status: Married    Spouse name: Not on file   Number of children: Not on file   Years of education: Not on file   Highest education level: Master's degree (e.g., MA, MS, MEng, MEd, MSW, MBA)  Occupational History   Not on file  Tobacco Use   Smoking status: Never   Smokeless tobacco: Never   Tobacco comments:    Exposed to second hand smoke as a child  Substance and Sexual Activity   Alcohol use: Yes    Alcohol/week: 0.0 standard drinks of alcohol    Comment: occasionally   Drug use: No   Sexual activity: Not on file  Other Topics Concern   Not on file  Social History Narrative   Not on file   Social Determinants of Health   Financial  Resource Strain: Low Risk  (06/17/2022)   Overall Financial Resource Strain (CARDIA)    Difficulty of Paying Living Expenses: Not hard at all  Food Insecurity: No Food Insecurity (06/17/2022)   Hunger Vital Sign    Worried About Running Out of Food in the Last Year: Never true    Ran Out of Food in the Last Year: Never true  Transportation Needs: No Transportation Needs (06/17/2022)   PRAPARE - Administrator, Civil Service (Medical): No    Lack of Transportation (Non-Medical): No  Physical Activity: Insufficiently Active (06/17/2022)   Exercise Vital Sign    Days of Exercise per Week: 2 days    Minutes of Exercise per Session: 20 min  Stress: No Stress Concern Present (06/17/2022)   [employer] of Occupational Health - Occupational Stress Questionnaire    Feeling of Stress : Not at all  Social Connections: Socially Integrated (06/17/2022)   Social Connection and Isolation Panel [NHANES]    Frequency of Communication with Friends and Family: More than three times a week    Frequency of Social Gatherings with Friends and Family: More than three times a week    Attends Religious Services: More than 4 times per year    Active Member of [employer] or Organizations: Yes    Attends Banker Meetings: More than 4 times per year  Marital Status: Married  Catering manager Violence: Not on file     Allergies  Allergen Reactions   Hydralazine Hives and Shortness Of Breath    Chest tightness, severe headache, dyspnea Other reaction(s): Chest Pain, Headache, Respiratory Distress   Gluten Meal Diarrhea    bloating   Moxifloxacin Hives    Avelox   Pseudoephedrine Other (See Comments)    Confusion, passed out, numbness   Sulfa Antibiotics Hives   Sulfonamide Derivatives Hives     CBC    Component Value Date/Time   WBC 5.8 02/26/2022 1003   WBC 12.3 (H) 02/01/2022 0944   RBC 4.01 (L) 02/26/2022 1003   RBC 4.05 (L) 02/01/2022 0944   HGB 12.1 (L) 02/26/2022 1003   HCT  37.4 (L) 06/25/2022 1023   PLT 237 02/26/2022 1003   MCV 93 02/26/2022 1003   MCH 30.2 02/26/2022 1003   MCH 31.4 02/01/2022 0944   MCHC 32.4 02/26/2022 1003   MCHC 33.7 02/01/2022 0944   RDW 13.0 02/26/2022 1003   LYMPHSABS 0.6 (L) 02/01/2022 0944   LYMPHSABS 2.2 11/20/2021 0850   MONOABS 0.7 02/01/2022 0944   EOSABS 0.0 02/01/2022 0944   EOSABS 0.1 11/20/2021 0850   BASOSABS 0.1 02/01/2022 0944   BASOSABS 0.1 11/20/2021 0850    Pulmonary Functions Testing Results:    Latest Ref Rng & Units 04/03/2022   11:30 AM  PFT Results  FVC-Pre L 5.13   FVC-Predicted Pre % 104   FVC-Post L 5.27   FVC-Predicted Post % 107   Pre FEV1/FVC % % 90   Post FEV1/FCV % % 90   FEV1-Pre L 4.61   FEV1-Predicted Pre % 126   FEV1-Post L 4.75   DLCO uncorrected ml/min/mmHg 35.99   DLCO UNC% % 127   DLVA Predicted % 107   TLC L 6.80   TLC % Predicted % 91   RV % Predicted % 78     Outpatient Medications Prior to Visit  Medication Sig Dispense Refill   albuterol (VENTOLIN HFA) 108 (90 Base) MCG/ACT inhaler Inhale 1-2 puffs into the lungs every 6 (six) hours as needed for wheezing or shortness of breath. 18 g 3   amoxicillin-clavulanate (AUGMENTIN) 875-125 MG tablet Take 1 tablet by mouth every 12 (twelve) hours. 14 tablet 0   azithromycin (ZITHROMAX Z-PAK) 250 MG tablet Take 2 tablets (500 mg) today, then 1 tablet (250 mg) for next 4 days. 6 tablet 0   Calcium Carb-Cholecalciferol (CALCIUM 600+D) 600-800 MG-UNIT TABS Take 1 tablet by mouth daily.     dexlansoprazole (DEXILANT) 60 MG capsule Take 1 capsule (60 mg total) by mouth daily. 90 capsule 1   dutasteride (AVODART) 0.5 MG capsule Take 1 capsule (0.5 mg total) by mouth daily. 90 capsule 3   famotidine (PEPCID) 40 MG tablet TAKE ONE TABLET BY MOUTH DAILY (Patient taking differently: Take 40 mg by mouth at bedtime.) 30 tablet 11   fluticasone (FLONASE) 50 MCG/ACT nasal spray Place 1 spray into both nostrils daily. 18.2 mL 11    fluticasone-salmeterol (ADVAIR) 500-50 MCG/ACT AEPB Inhale 1 puff into the lungs in the morning and at bedtime. 60 each 6   guaiFENesin-codeine (ROBITUSSIN AC) 100-10 MG/5ML syrup Take 5 mLs by mouth 3 (three) times daily as needed for cough. 120 mL 0   hydrochlorothiazide (MICROZIDE) 12.5 MG capsule Take 1 capsule (12.5 mg total) by mouth daily. 90 capsule 1   ketotifen (ZADITOR) 0.025 % ophthalmic solution Place 1 drop into both eyes daily.  losartan (COZAAR) 100 MG tablet Take 1 tablet (100 mg total) by mouth daily. 90 tablet 1   Misc Natural Products (GLUCOSAMINE CHONDROITIN TRIPLE) TABS Take 1 tablet by mouth 2 (two) times daily.     Multiple Vitamin (MULTI-VITAMINS) TABS Take 1 tablet by mouth daily.      Multiple Vitamins-Minerals (PRESERVISION AREDS 2) CAPS Take 1 capsule by mouth 2 (two) times daily.     predniSONE (DELTASONE) 50 MG tablet Take 1 tablet (50 mg total) by mouth daily with breakfast for 7 days. 7 tablet 0   scopolamine (TRANSDERM-SCOP) 1 MG/3DAYS Place 1 patch (1.5 mg total) onto the skin every 3 (three) days. 15 patch 2   tadalafil (CIALIS) 5 MG tablet Take 1 tablet (5 mg total) by mouth at bedtime. 90 tablet 3   tamsulosin (FLOMAX) 0.4 MG CAPS capsule Take 1 capsule (0.4 mg total) by mouth daily. 90 capsule 3   Turmeric 500 MG CAPS Take 500 mg by mouth 2 (two) times daily.      verapamil (CALAN-SR) 240 MG CR tablet TAKE ONE TABLET BY MOUTH TWICE A DAY 180 tablet 1   metoprolol tartrate (LOPRESSOR) 100 MG tablet Take tablet (100mg ) TWO hours prior to your cardiac CT scan. (Patient not taking: Reported on 09/01/2022) 1 tablet 0   No facility-administered medications prior to visit.

## 2022-09-01 NOTE — Telephone Encounter (Signed)
Tezspire enrollment form faxed to pharmacy team.  Received successful fax confirmation.

## 2022-09-01 NOTE — Telephone Encounter (Signed)
Fax received, will begin BIV in separate encounter.

## 2022-09-02 ENCOUNTER — Telehealth: Payer: Self-pay | Admitting: Pharmacist

## 2022-09-02 NOTE — Telephone Encounter (Signed)
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 Submitted a Prior Authorization request to  [employer] for HTA  for TEZSPIRE via CoverMyMeds. Will update once we receive a response.  Key: [id number]

## 2022-09-02 NOTE — Telephone Encounter (Signed)
PA will not successfully send via CMM. PA for Dorothea Ogle will be completed via HTA form and faxed.  Chesley Mires, PharmD, MPH, BCPS, CPP Clinical Pharmacist (Rheumatology and Pulmonology)

## 2022-09-03 NOTE — Telephone Encounter (Signed)
Submitted a Prior Authorization request to  [employer]  for TEZSPIRE via fax. Will update once we receive a response.  Fax: [phone] Phone: [phone]  Chesley Mires, PharmD, MPH, BCPS, CPP Clinical Pharmacist (Rheumatology and Pulmonology)

## 2022-09-04 LAB — FUNGAL ORGANISM REFLEX

## 2022-09-04 LAB — FUNGUS CULTURE WITH STAIN

## 2022-09-17 ENCOUNTER — Other Ambulatory Visit (HOSPITAL_COMMUNITY): Payer: Self-pay

## 2022-09-17 NOTE — Telephone Encounter (Signed)
Received notification from [employer]/ [employer] regarding a prior authorization for TEZSPIRE. Authorization has been APPROVED from 09/03/2022 to 03/10/2023. Approval letter sent to scan center.  Per test claim, copay for 28 days supply is $1,067.34  Patient can fill through [facility]: [phone]   Authorization # [id number]   Will finalize compilation of PAP paperwork for submission.

## 2022-09-17 NOTE — Telephone Encounter (Signed)
Submitted Patient Assistance Application to  [employer]  for [employer] along with provider portion, PA and income documents. Will update patient when we receive a response.  Phone#: [phone] Fax#: [phone]

## 2022-09-18 LAB — ACID FAST CULTURE WITH REFLEXED SENSITIVITIES (MYCOBACTERIA): Acid Fast Culture: NEGATIVE

## 2022-09-19 ENCOUNTER — Encounter: Payer: Self-pay | Admitting: Family Medicine

## 2022-09-19 ENCOUNTER — Ambulatory Visit (INDEPENDENT_AMBULATORY_CARE_PROVIDER_SITE_OTHER): Payer: HMO | Admitting: Family Medicine

## 2022-09-19 ENCOUNTER — Other Ambulatory Visit: Payer: HMO

## 2022-09-19 VITALS — BP 143/73 | HR 77 | Temp 97.8°F | Resp 13 | Ht 72.0 in | Wt 198.3 lb

## 2022-09-19 DIAGNOSIS — I10 Essential (primary) hypertension: Secondary | ICD-10-CM | POA: Diagnosis not present

## 2022-09-19 DIAGNOSIS — R2231 Localized swelling, mass and lump, right upper limb: Secondary | ICD-10-CM | POA: Insufficient documentation

## 2022-09-19 DIAGNOSIS — E291 Testicular hypofunction: Secondary | ICD-10-CM

## 2022-09-19 DIAGNOSIS — N401 Enlarged prostate with lower urinary tract symptoms: Secondary | ICD-10-CM

## 2022-09-19 NOTE — Assessment & Plan Note (Signed)
Right Arm Nodule: Soft, mobile, non-tender nodule on the right posterior humerus, present for less than a year. Differential includes lipoma or lymph node. -Order ultrasound of right upper extremity to further evaluate the nodule.

## 2022-09-19 NOTE — Assessment & Plan Note (Signed)
Hypertension with Orthostatic Symptoms: Reports of dizziness, particularly upon standing. Blood pressure measurements at home have been within normal range, but orthostatic measurements in office were negative. Chronic -Hold hydrochlorothiazide and continue to monitor blood pressure, aiming for less than 140/90. -Follow up in three weeks to reassess blood pressure and dizziness after holding hydrochlorothiazide.

## 2022-09-19 NOTE — Progress Notes (Signed)
Established patient visit   Patient: [name] [name]   DOB: Sep 29, 1955   67 y.o. Male  MRN: [id number] Visit Date: 09/19/2022  Today's healthcare provider: [name], MD   Chief Complaint  Patient presents with   Hypertension    Patient states to have noticed a bump on his right arm, no pain or issues about it.    Dizziness    Patient states he has also been feeling dizziness some days and would like to discuss BP and his medications since his BP has been running lower.Patient states his having low energy again and would like to have blood tests to check if he is still anemic.   Subjective     HPI     Hypertension    Additional comments: Patient states to have noticed a bump on his right arm, no pain or issues about it.         Dizziness    Additional comments: Patient states he has also been feeling dizziness some days and would like to discuss BP and his medications since his BP has been running lower.Patient states his having low energy again and would like to have blood tests to check if he is still anemic.      Last edited by Lubertha Basque, CMA on 09/19/2022  8:34 AM.       Discussed the use of AI scribe software for clinical note transcription with the patient, who gave verbal consent to proceed.  History of Present Illness     The patient, a 67 year old male with a history of hypertension, anemia, and a recently discovered nodule on the right arm, presents with concerns of hypotensive blood pressure readings and associated dizziness. The patient reports experiencing dizziness, particularly upon standing or walking, but denies any fainting episodes. The dizziness is not correlated with the times when the patient's blood pressure readings are lower than usual at home. The patient's current medication regimen for hypertension includes losartan 100 mg daily, hydrochlorothiazide 12.5 mg daily, and verapamil 240 mg twice a day. The patient's home  blood pressure readings have been generally within the normal range, with occasional readings slightly lower than usual.   The patient has a history of upper respiratory infections and has been recommended for surgery to address a hiatal hernia and esophageal issues. The surgery was postponed due to a bronchitis episode and steroid use, and is now planned for the upcoming months. The patient also has a history of fainting, but recent cardiology workup, including a stress test and Holter monitor, did not reveal any significant cardiac issues.      Right Arm Nodule  The patient also reports a nodule on the right posterior humerus, discovered less than a year ago. The nodule is described as soft, mobile, and non-tender, with no associated skin changes or sensory alterations. The patient has been using the arm normally without any discomfort or functional limitations.  History of anemia  fatigue The patient also reports a history of anemia and fatigue. The last recorded hemoglobin level was 12.1 in December of the previous year.   Hypogonadism The patient is also on testosterone therapy and has regular blood tests with a urologist to monitor liver function, PSA, and testosterone levels.   Medications: Outpatient Medications Prior to Visit  Medication Sig   albuterol (VENTOLIN HFA) 108 (90 Base) MCG/ACT inhaler Inhale 1-2 puffs into the lungs every 6 (six) hours as needed for wheezing or shortness of breath.   Calcium  Carb-Cholecalciferol (CALCIUM 600+D) 600-800 MG-UNIT TABS Take 1 tablet by mouth daily.   dexlansoprazole (DEXILANT) 60 MG capsule Take 1 capsule (60 mg total) by mouth daily.   dutasteride (AVODART) 0.5 MG capsule Take 1 capsule (0.5 mg total) by mouth daily.   famotidine (PEPCID) 40 MG tablet TAKE ONE TABLET BY MOUTH DAILY (Patient taking differently: Take 40 mg by mouth at bedtime.)   fluticasone (FLONASE) 50 MCG/ACT nasal spray Place 1 spray into both nostrils daily.    fluticasone-salmeterol (ADVAIR) 500-50 MCG/ACT AEPB Inhale 1 puff into the lungs in the morning and at bedtime.   guaiFENesin-codeine (ROBITUSSIN AC) 100-10 MG/5ML syrup Take 5 mLs by mouth 3 (three) times daily as needed for cough.   hydrochlorothiazide (MICROZIDE) 12.5 MG capsule Take 1 capsule (12.5 mg total) by mouth daily.   ketotifen (ZADITOR) 0.025 % ophthalmic solution Place 1 drop into both eyes daily.    losartan (COZAAR) 100 MG tablet Take 1 tablet (100 mg total) by mouth daily.   Misc Natural Products (GLUCOSAMINE CHONDROITIN TRIPLE) TABS Take 1 tablet by mouth 2 (two) times daily.   Multiple Vitamin (MULTI-VITAMINS) TABS Take 1 tablet by mouth daily.    Multiple Vitamins-Minerals (PRESERVISION AREDS 2) CAPS Take 1 capsule by mouth 2 (two) times daily.   scopolamine (TRANSDERM-SCOP) 1 MG/3DAYS Place 1 patch (1.5 mg total) onto the skin every 3 (three) days.   tadalafil (CIALIS) 5 MG tablet Take 1 tablet (5 mg total) by mouth at bedtime.   tamsulosin (FLOMAX) 0.4 MG CAPS capsule Take 1 capsule (0.4 mg total) by mouth daily.   Turmeric 500 MG CAPS Take 500 mg by mouth 2 (two) times daily.    verapamil (CALAN-SR) 240 MG CR tablet TAKE ONE TABLET BY MOUTH TWICE A DAY   [DISCONTINUED] amoxicillin-clavulanate (AUGMENTIN) 875-125 MG tablet Take 1 tablet by mouth every 12 (twelve) hours.   [DISCONTINUED] azithromycin (ZITHROMAX Z-PAK) 250 MG tablet Take 2 tablets (500 mg) today, then 1 tablet (250 mg) for next 4 days.   [DISCONTINUED] metoprolol tartrate (LOPRESSOR) 100 MG tablet Take tablet (100mg ) TWO hours prior to your cardiac CT scan. (Patient not taking: Reported on 09/01/2022)   No facility-administered medications prior to visit.    Review of Systems     Objective    BP (!) 143/73 (BP Location: Left Arm, Patient Position: Sitting, Cuff Size: Normal)   Pulse 77   Temp 97.8 F (36.6 C) (Oral)   Resp 13   Ht 6' (1.829 m)   Wt 198 lb 4.8 oz (89.9 kg)   SpO2 100%   BMI 26.89  kg/m    Physical Exam Vitals reviewed.  Constitutional:      General: He is not in acute distress.    Appearance: Normal appearance. He is not ill-appearing, toxic-appearing or diaphoretic.  Eyes:     Conjunctiva/sclera: Conjunctivae normal.  Cardiovascular:     Rate and Rhythm: Normal rate and regular rhythm.     Pulses: Normal pulses.     Heart sounds: Normal heart sounds. No murmur heard.    No friction rub. No gallop.  Pulmonary:     Effort: Pulmonary effort is normal. No respiratory distress.     Breath sounds: No stridor. No wheezing, rhonchi or rales.     Comments: Coarse breath sounds bilaterally Abdominal:     General: Bowel sounds are normal. There is no distension.     Palpations: Abdomen is soft.     Tenderness: There is no abdominal tenderness.  Musculoskeletal:     Right lower leg: No edema.     Left lower leg: No edema.  Skin:    Findings: No erythema or rash.  Neurological:     Mental Status: He is alert and oriented to person, place, and time.       No results found for any visits on 09/19/22.  Assessment & Plan     Problem List Items Addressed This Visit     Essential hypertension - Primary    Hypertension with Orthostatic Symptoms: Reports of dizziness, particularly upon standing. Blood pressure measurements at home have been within normal range, but orthostatic measurements in office were negative. Chronic -Hold hydrochlorothiazide and continue to monitor blood pressure, aiming for less than 140/90. -Follow up in three weeks to reassess blood pressure and dizziness after holding hydrochlorothiazide.      Subcutaneous nodule of right upper extremity    Right Arm Nodule: Soft, mobile, non-tender nodule on the right posterior humerus, present for less than a year. Differential includes lipoma or lymph node. -Order ultrasound of right upper extremity to further evaluate the nodule.      Relevant Orders   [place] RT UPPER EXTREM LTD SOFT TISSUE NON  VASCULAR         Anemia: History of anemia with last hemoglobin of 12.1 in December 2023. Patient reports ongoing fatigue. -Urologist to check hematocrit as part of upcoming blood work.  General Health Maintenance / Followup Plans: -Schedule annual wellness visit for mid-October 2024. -Plan for potential hiatal hernia surgery in September 2024.        Return in about 3 weeks (around 10/10/2022) for HTN,dizziness.         [name], MD  [facility] [phone] (phone) [phone] (fax)  [facility]

## 2022-09-19 NOTE — Patient Instructions (Signed)
Your blood pressure indicates some lower numbers that may be associated with dehydration and need to adjust blood pressure medications    Your blood pressure goal is less than 140/90 for now.    Please continue to monitor your blood pressure 1-2 hours after BP medications and follow up with me in 3 weeks

## 2022-09-20 LAB — PSA: Prostate Specific Ag, Serum: 0.6 ng/mL (ref 0.0–4.0)

## 2022-09-20 LAB — HEMATOCRIT: Hematocrit: 41.4 % (ref 37.5–51.0)

## 2022-09-20 LAB — TESTOSTERONE: Testosterone: 563 ng/dL (ref 264–916)

## 2022-09-21 ENCOUNTER — Other Ambulatory Visit: Payer: Self-pay | Admitting: Urology

## 2022-09-21 DIAGNOSIS — N401 Enlarged prostate with lower urinary tract symptoms: Secondary | ICD-10-CM

## 2022-09-24 ENCOUNTER — Ambulatory Visit: Payer: HMO

## 2022-09-24 ENCOUNTER — Ambulatory Visit
Admission: RE | Admit: 2022-09-24 | Discharge: 2022-09-24 | Disposition: A | Discharge status: Home / Self Care | Payer: HMO | Source: Ambulatory Visit | Attending: Family Medicine | Admitting: Family Medicine

## 2022-09-24 VITALS — Ht 72.0 in | Wt 198.0 lb

## 2022-09-24 DIAGNOSIS — R2231 Localized swelling, mass and lump, right upper limb: Secondary | ICD-10-CM | POA: Insufficient documentation

## 2022-09-24 DIAGNOSIS — H52223 Regular astigmatism, bilateral: Secondary | ICD-10-CM | POA: Diagnosis not present

## 2022-09-24 DIAGNOSIS — Z0000 Encounter for general adult medical examination without abnormal findings: Secondary | ICD-10-CM | POA: Diagnosis not present

## 2022-09-24 DIAGNOSIS — H5213 Myopia, bilateral: Secondary | ICD-10-CM | POA: Diagnosis not present

## 2022-09-24 DIAGNOSIS — H524 Presbyopia: Secondary | ICD-10-CM | POA: Diagnosis not present

## 2022-09-24 DIAGNOSIS — H353121 Nonexudative age-related macular degeneration, left eye, early dry stage: Secondary | ICD-10-CM | POA: Diagnosis not present

## 2022-09-24 NOTE — Telephone Encounter (Signed)
Received a Benefits Investigation report from [employer]. Informed that pt's enrollment into the PAP program has been placed on hold due to requiring a prior auth through medical benefits, however their own benefits investigation reveals that there is no PA required through medical benefits.   Called the program and spoke to rep who ultimately determined the information above by reviewing their case notes, however she also stated that a new enrollment form would be required as their policy states that a pt must be accepted into their program through the medical benefits portion and can only be transitioned to self-administration after the fact.

## 2022-09-24 NOTE — Patient Instructions (Signed)
[name] [name] , Thank you for taking time to come for your Medicare Wellness Visit. I appreciate your ongoing commitment to your health goals. Please review the following plan we discussed and let me know if I can assist you in the future.   These are the goals we discussed:  Goals   None     This is a list of the screening recommended for you and due dates:  Health Maintenance  Topic Date Due   Zoster (Shingles) Vaccine (1 of 2) Never done   COVID-19 Vaccine (7 - 2023-24 season) 02/07/2022   Flu Shot  10/09/2022   Medicare Annual Wellness Visit  09/24/2023   DTaP/Tdap/Td vaccine (3 - Td or Tdap) 10/12/2023   Colon Cancer Screening  08/14/2026   Pneumonia Vaccine  Completed   Hepatitis C Screening  Completed   HPV Vaccine  Aged Out    Advanced directives: no  Conditions/risks identified: low fall risk  Next appointment: Follow up in one year for your annual wellness visit. 09/29/2023 @ 2:30pm telephone  Preventive Care 65 Years and Older, Male  Preventive care refers to lifestyle choices and visits with your health care provider that can promote health and wellness. What does preventive care include? A yearly physical exam. This is also called an annual well check. Dental exams once or twice a year. Routine eye exams. Ask your health care provider how often you should have your eyes checked. Personal lifestyle choices, including: Daily care of your teeth and gums. Regular physical activity. Eating a healthy diet. Avoiding tobacco and drug use. Limiting alcohol use. Practicing safe sex. Taking low doses of aspirin every day. Taking vitamin and mineral supplements as recommended by your health care provider. What happens during an annual well check? The services and screenings done by your health care provider during your annual well check will depend on your age, overall health, lifestyle risk factors, and family history of disease. Counseling  Your health care provider  may ask you questions about your: Alcohol use. Tobacco use. Drug use. Emotional well-being. Home and relationship well-being. Sexual activity. Eating habits. History of falls. Memory and ability to understand (cognition). Work and work Astronomer. Screening  You may have the following tests or measurements: Height, weight, and BMI. Blood pressure. Lipid and cholesterol levels. These may be checked every 5 years, or more frequently if you are over 8 years old. Skin check. Lung cancer screening. You may have this screening every year starting at age 50 if you have a 30-pack-year history of smoking and currently smoke or have quit within the past 15 years. Fecal occult blood test (FOBT) of the stool. You may have this test every year starting at age 26. Flexible sigmoidoscopy or colonoscopy. You may have a sigmoidoscopy every 5 years or a colonoscopy every 10 years starting at age 84. Prostate cancer screening. Recommendations will vary depending on your family history and other risks. Hepatitis C blood test. Hepatitis B blood test. Sexually transmitted disease (STD) testing. Diabetes screening. This is done by checking your blood sugar (glucose) after you have not eaten for a while (fasting). You may have this done every 1-3 years. Abdominal aortic aneurysm (AAA) screening. You may need this if you are a current or former smoker. Osteoporosis. You may be screened starting at age 77 if you are at high risk. Talk with your health care provider about your test results, treatment options, and if necessary, the need for more tests. Vaccines  Your health care provider  may recommend certain vaccines, such as: Influenza vaccine. This is recommended every year. Tetanus, diphtheria, and acellular pertussis (Tdap, Td) vaccine. You may need a Td booster every 10 years. Zoster vaccine. You may need this after age 64. Pneumococcal 13-valent conjugate (PCV13) vaccine. One dose is recommended after  age 32. Pneumococcal polysaccharide (PPSV23) vaccine. One dose is recommended after age 22. Talk to your health care provider about which screenings and vaccines you need and how often you need them. This information is not intended to replace advice given to you by your health care provider. Make sure you discuss any questions you have with your health care provider. Document Released: 03/23/2015 Document Revised: 11/14/2015 Document Reviewed: 12/26/2014 Elsevier Interactive Patient Education  2017 [employer].  Fall Prevention in the Home Falls can cause injuries. They can happen to people of all ages. There are many things you can do to make your home safe and to help prevent falls. What can I do on the outside of my home? Regularly fix the edges of walkways and driveways and fix any cracks. Remove anything that might make you trip as you walk through a door, such as a raised step or threshold. Trim any bushes or trees on the path to your home. Use bright outdoor lighting. Clear any walking paths of anything that might make someone trip, such as rocks or tools. Regularly check to see if handrails are loose or broken. Make sure that both sides of any steps have handrails. Any raised decks and porches should have guardrails on the edges. Have any leaves, snow, or ice cleared regularly. Use sand or salt on walking paths during winter. Clean up any spills in your garage right away. This includes oil or grease spills. What can I do in the bathroom? Use night lights. Install grab bars by the toilet and in the tub and shower. Do not use towel bars as grab bars. Use non-skid mats or decals in the tub or shower. If you need to sit down in the shower, use a plastic, non-slip stool. Keep the floor dry. Clean up any water that spills on the floor as soon as it happens. Remove soap buildup in the tub or shower regularly. Attach bath mats securely with double-sided non-slip rug tape. Do not have  throw rugs and other things on the floor that can make you trip. What can I do in the bedroom? Use night lights. Make sure that you have a light by your bed that is easy to reach. Do not use any sheets or blankets that are too big for your bed. They should not hang down onto the floor. Have a firm chair that has side arms. You can use this for support while you get dressed. Do not have throw rugs and other things on the floor that can make you trip. What can I do in the kitchen? Clean up any spills right away. Avoid walking on wet floors. Keep items that you use a lot in easy-to-reach places. If you need to reach something above you, use a strong step stool that has a grab bar. Keep electrical cords out of the way. Do not use floor polish or wax that makes floors slippery. If you must use wax, use non-skid floor wax. Do not have throw rugs and other things on the floor that can make you trip. What can I do with my stairs? Do not leave any items on the stairs. Make sure that there are handrails on both sides  of the stairs and use them. Fix handrails that are broken or loose. Make sure that handrails are as long as the stairways. Check any carpeting to make sure that it is firmly attached to the stairs. Fix any carpet that is loose or worn. Avoid having throw rugs at the top or bottom of the stairs. If you do have throw rugs, attach them to the floor with carpet tape. Make sure that you have a light switch at the top of the stairs and the bottom of the stairs. If you do not have them, ask someone to add them for you. What else can I do to help prevent falls? Wear shoes that: Do not have high heels. Have rubber bottoms. Are comfortable and fit you well. Are closed at the toe. Do not wear sandals. If you use a stepladder: Make sure that it is fully opened. Do not climb a closed stepladder. Make sure that both sides of the stepladder are locked into place. Ask someone to hold it for you, if  possible. Clearly mark and make sure that you can see: Any grab bars or handrails. First and last steps. Where the edge of each step is. Use tools that help you move around (mobility aids) if they are needed. These include: Canes. Walkers. Scooters. Crutches. Turn on the lights when you go into a dark area. Replace any light bulbs as soon as they burn out. Set up your furniture so you have a clear path. Avoid moving your furniture around. If any of your floors are uneven, fix them. If there are any pets around you, be aware of where they are. Review your medicines with your doctor. Some medicines can make you feel dizzy. This can increase your chance of falling. Ask your doctor what other things that you can do to help prevent falls. This information is not intended to replace advice given to you by your health care provider. Make sure you discuss any questions you have with your health care provider. Document Released: 12/21/2008 Document Revised: 08/02/2015 Document Reviewed: 03/31/2014 Elsevier Interactive Patient Education  2017 [employer].

## 2022-09-24 NOTE — Progress Notes (Signed)
Subjective:   [name] [name] is a 67 y.o. male who presents for Medicare Annual/Subsequent preventive examination.  Visit Complete: Virtual  I connected with  Precious Haws on 09/24/22 by a audio enabled telemedicine application and verified that I am speaking with the correct person using two identifiers.  Patient Location: Home  Provider Location: Office/Clinic  I discussed the limitations of evaluation and management by telemedicine. The patient expressed understanding and agreed to proceed.  Patient Medicare AWV questionnaire was completed by the patient on 09/22/22; I have confirmed that all information answered by patient is correct and no changes since this date.  Review of Systems    Cardiac Risk Factors include: advanced age (>83men, >18 women);male gender;hypertension;sedentary lifestyle     Objective:    Today's Vitals   09/24/22 1447  Weight: 198 lb (89.8 kg)  Height: 6' (1.829 m)   Body mass index is 26.85 kg/m.     09/24/2022    2:57 PM 08/29/2022    1:50 PM 01/29/2022    7:08 PM 08/13/2016    7:50 AM 08/22/2014    8:11 AM  Advanced Directives  Does Patient Have a Medical Advance Directive? No No No No No    Current Medications (verified) Outpatient Encounter Medications as of 09/24/2022  Medication Sig   albuterol (VENTOLIN HFA) 108 (90 Base) MCG/ACT inhaler Inhale 1-2 puffs into the lungs every 6 (six) hours as needed for wheezing or shortness of breath.   Calcium Carb-Cholecalciferol (CALCIUM 600+D) 600-800 MG-UNIT TABS Take 1 tablet by mouth daily.   dexlansoprazole (DEXILANT) 60 MG capsule Take 1 capsule (60 mg total) by mouth daily.   dutasteride (AVODART) 0.5 MG capsule Take 1 capsule (0.5 mg total) by mouth daily.   famotidine (PEPCID) 40 MG tablet TAKE ONE TABLET BY MOUTH DAILY (Patient taking differently: Take 40 mg by mouth at bedtime.)   fluticasone (FLONASE) 50 MCG/ACT nasal spray Place 1 spray into both nostrils daily.    fluticasone-salmeterol (ADVAIR) 500-50 MCG/ACT AEPB Inhale 1 puff into the lungs in the morning and at bedtime.   ketotifen (ZADITOR) 0.025 % ophthalmic solution Place 1 drop into both eyes daily.    losartan (COZAAR) 100 MG tablet Take 1 tablet (100 mg total) by mouth daily.   Misc Natural Products (GLUCOSAMINE CHONDROITIN TRIPLE) TABS Take 1 tablet by mouth 2 (two) times daily.   Multiple Vitamin (MULTI-VITAMINS) TABS Take 1 tablet by mouth daily.    Multiple Vitamins-Minerals (PRESERVISION AREDS 2) CAPS Take 1 capsule by mouth 2 (two) times daily.   scopolamine (TRANSDERM-SCOP) 1 MG/3DAYS Place 1 patch (1.5 mg total) onto the skin every 3 (three) days.   tadalafil (CIALIS) 5 MG tablet Take 1 tablet (5 mg total) by mouth at bedtime.   tamsulosin (FLOMAX) 0.4 MG CAPS capsule TAKE ONE CAPSULE BY MOUTH ONE TIME DAILY   Turmeric 500 MG CAPS Take 500 mg by mouth 2 (two) times daily.    verapamil (CALAN-SR) 240 MG CR tablet TAKE ONE TABLET BY MOUTH TWICE A DAY   guaiFENesin-codeine (ROBITUSSIN AC) 100-10 MG/5ML syrup Take 5 mLs by mouth 3 (three) times daily as needed for cough. (Patient not taking: Reported on 09/24/2022)   hydrochlorothiazide (MICROZIDE) 12.5 MG capsule Take 1 capsule (12.5 mg total) by mouth daily. (Patient not taking: Reported on 09/24/2022)   No facility-administered encounter medications on file as of 09/24/2022.    Allergies (verified) Hydralazine, Gluten meal, Moxifloxacin, Pseudoephedrine, Sulfa antibiotics, and Sulfonamide derivatives   History: Past  Medical History:  Diagnosis Date   Allergy    Arthritis    Knees & hands   Asthma    BP (high blood pressure) 11/07/2014   Should stop combination of Tekturna (DRI)  and ARB    Cataract 2018   Closed fracture of lateral malleolus of left fibula 07/23/2020   GERD (gastroesophageal reflux disease)    Hypertension    HYPERTENSION, SEVERE 03/15/2007   Qualifier: Diagnosis of  By: Vernie Murders     Prostate enlargement     Sleep apnea    Status post total knee replacement using cement, left 04/01/2019   Stress fracture of tibia and fibula 07/18/2020   Past Surgical History:  Procedure Laterality Date   APPENDECTOMY  1990   Dr Lemar Livings   COLONOSCOPY  2008   Dr Lemar Livings   COLONOSCOPY WITH PROPOFOL N/A 08/13/2016   Procedure: COLONOSCOPY WITH PROPOFOL;  Surgeon: Earline Mayotte, MD;  Location: [facility] ENDOSCOPY;  Service: Endoscopy;  Laterality: N/A;   FRACTURE SURGERY  April 2023   Left Ankle   HEMORRHOID SURGERY     HERNIA REPAIR  01/20/2014   umbilical hernia   JOINT REPLACEMENT  Feb 2021   Left knee replacement   KNEE ARTHROSCOPY Left    KNEE SURGERY Left 1986   NASAL SEPTUM SURGERY     TONSILLECTOMY     TOTAL KNEE ARTHROPLASTY Left 03/29/2019   Procedure: TOTAL KNEE ARTHROPLASTY;  Surgeon: Christena Flake, MD;  Location: [facility] ORS;  Service: Orthopedics;  Laterality: Left;   VASECTOMY     Family History  Problem Relation Age of Onset   Cancer Mother        ovarian   Glaucoma Mother    Cancer Father        prostate   Hypertension Sister    Asthma Sister    Alcohol abuse Paternal Uncle    Liver disease Paternal Uncle    Cancer - Prostate Paternal Uncle    Alcohol abuse Paternal Uncle    Cancer Maternal Uncle        lung; two mat uncles   Cancer Paternal Grandmother        breast   Cancer Paternal Aunt        lung cancer in a smoker   Arthritis Maternal Grandfather    Arthritis Maternal Grandmother    ADD / ADHD Son    Anxiety disorder Son    Cancer Maternal Aunt    Cancer Maternal Uncle    Social History   Socioeconomic History   Marital status: Married    Spouse name: Not on file   Number of children: Not on file   Years of education: Not on file   Highest education level: Master's degree (e.g., MA, MS, MEng, MEd, MSW, MBA)  Occupational History   Not on file  Tobacco Use   Smoking status: Never   Smokeless tobacco: Never   Tobacco comments:    2nd hand smoke growing up   Substance and Sexual Activity   Alcohol use: Not Currently    Comment: occasionally   Drug use: Never   Sexual activity: Yes    Birth control/protection: Surgical  Other Topics Concern   Not on file  Social History Narrative   Not on file   Social Determinants of Health   Financial Resource Strain: Patient Declined (09/22/2022)   Overall Financial Resource Strain (CARDIA)    Difficulty of Paying Living Expenses: Patient declined  Food Insecurity: No Food Insecurity (  09/22/2022)   Hunger Vital Sign    Worried About Running Out of Food in the Last Year: Never true    Ran Out of Food in the Last Year: Never true  Transportation Needs: No Transportation Needs (09/22/2022)   PRAPARE - Administrator, Civil Service (Medical): No    Lack of Transportation (Non-Medical): No  Physical Activity: Patient Declined (09/22/2022)   Exercise Vital Sign    Days of Exercise per Week: Patient declined    Minutes of Exercise per Session: Patient declined  Stress: Patient Declined (09/22/2022)   [employer] of Occupational Health - Occupational Stress Questionnaire    Feeling of Stress : Patient declined  Social Connections: Unknown (09/22/2022)   Social Connection and Isolation Panel [NHANES]    Frequency of Communication with Friends and Family: Patient declined    Frequency of Social Gatherings with Friends and Family: Patient declined    Attends Religious Services: More than 4 times per year    Active Member of [employer] or Organizations: Patient declined    Attends Engineer, structural: Patient declined    Marital Status: Patient declined    Tobacco Counseling Counseling given: Not Answered Tobacco comments: 2nd hand smoke growing up   Clinical Intake:  Pre-visit preparation completed: Yes  Pain : No/denies pain     BMI - recorded: 26.85 Nutritional Status: BMI 25 -29 Overweight Nutritional Risks: None Diabetes: No  How often do you need to have someone  help you when you read instructions, pamphlets, or other written materials from your doctor or pharmacy?: 1 - Never  Interpreter Needed?: No  Comments: lives with wife Information entered by :: B.Anneta Rounds,LPN   Activities of Daily Living    09/22/2022    3:51 PM 06/24/2022    2:23 PM  In your present state of health, do you have any difficulty performing the following activities:  Hearing? 0 0  Vision? 0 0  Difficulty concentrating or making decisions? 0 0  Walking or climbing stairs? 0 0  Dressing or bathing? 0 0  Doing errands, shopping? 0 0  Preparing Food and eating ? N   Using the Toilet? N   In the past six months, have you accidently leaked urine? N   Do you have problems with loss of bowel control? N   Managing your Medications? N   Managing your Finances? N   Housekeeping or managing your Housekeeping? N     Patient Care Team: [name], MD as PCP - General (Family Medicine) Bosie Clos, MD (Family Medicine) Lemar Livings Merrily Pew, MD (General Surgery)  Indicate any recent Medical Services you may have received from other than [facility] providers in the past year (date may be approximate).     Assessment:   This is a routine wellness examination for [place].  Hearing/Vision screen Hearing Screening - Comments:: Adequate hearing Vision Screening - Comments:: Adequate vision w/glasses Dr Princella Ion  Dietary issues and exercise activities discussed:     Goals Addressed   None    Depression Screen    09/24/2022    2:55 PM 09/19/2022    8:35 AM 06/24/2022    2:22 PM 06/18/2022    8:34 AM 05/07/2022    1:25 PM 12/19/2021    8:21 AM 09/26/2021    3:48 PM  PHQ 2/9 Scores  PHQ - 2 Score 0 0 0 0 0 0 0  PHQ- 9 Score   0 0 0  0    Fall Risk  09/22/2022    3:51 PM 06/24/2022    2:22 PM 05/07/2022    1:25 PM 12/19/2021    8:20 AM 09/26/2021    3:48 PM  Fall Risk   Falls in the past year? 0 0 0 0 0  Number falls in past yr:  0 0 0 0  Injury  with Fall? 0 0 0 0 0  Risk for fall due to : No Fall Risks No Fall Risks  No Fall Risks No Fall Risks  Follow up Education provided;Falls prevention discussed Falls evaluation completed   Falls evaluation completed    MEDICARE RISK AT HOME:  Medicare Risk at Home - 09/24/22 1452     Any stairs in or around the home? Yes    If so, are there any without handrails? Yes    Home free of loose throw rugs in walkways, pet beds, electrical cords, etc? Yes    Adequate lighting in your home to reduce risk of falls? Yes    Life alert? No    Use of a cane, walker or w/c? No    Grab bars in the bathroom? No    Shower chair or bench in shower? Yes    Elevated toilet seat or a handicapped toilet? No             TIMED UP AND GO:  Was the test performed?  No    Cognitive Function:        09/24/2022    2:58 PM 12/19/2021    8:28 AM  6CIT Screen  What Year? 0 points 0 points  What month? 0 points 0 points  What time? 0 points 0 points  Count back from 20 0 points 0 points  Months in reverse 0 points 0 points  Repeat phrase 0 points 2 points  Total Score 0 points 2 points    Immunizations Immunization History  Administered Date(s) Administered   H1N1 01/18/2008   Influenza Split 01/12/2010   Influenza,inj,Quad PF,6+ Mos 11/28/2014, 11/11/2018   Influenza-Unspecified 12/01/2016, 12/01/2017, 11/23/2019, 11/22/2020, 11/08/2021, 12/06/2021   MMR 01/20/2018   Moderna SARS-COV2 Booster Vaccination 09/25/2020   Moderna Sars-Covid-2 Vaccination 02/15/2021   PFIZER(Purple Top)SARS-COV-2 Vaccination 05/17/2019, 06/07/2019, 12/08/2019   PNEUMOCOCCAL CONJUGATE-20 03/20/2022   Pneumococcal Polysaccharide-23 09/04/1998   Respiratory Syncytial Virus Vaccine,Recomb Aduvanted(Arexvy) 12/06/2021   Td 11/15/2003   Tdap 10/11/2013   Unspecified SARS-COV-2 Vaccination 12/13/2021    TDAP status: Up to date  Flu Vaccine status: Up to date  Pneumococcal vaccine status: Up to date  Covid-19  vaccine status: Completed vaccines  Qualifies for Shingles Vaccine? Yes   Zostavax completed No   Shingrix Completed?: No.    Education has been provided regarding the importance of this vaccine. Patient has been advised to call insurance company to determine out of pocket expense if they have not yet received this vaccine. Advised may also receive vaccine at local pharmacy or Health Dept. Verbalized acceptance and understanding.  Screening Tests Health Maintenance  Topic Date Due   Zoster Vaccines- Shingrix (1 of 2) Never done   COVID-19 Vaccine (7 - 2023-24 season) 02/07/2022   INFLUENZA VACCINE  10/09/2022   Medicare Annual Wellness (AWV)  09/24/2023   DTaP/Tdap/Td (3 - Td or Tdap) 10/12/2023   Colonoscopy  08/14/2026   Pneumonia Vaccine 53+ Years old  Completed   Hepatitis C Screening  Completed   HPV VACCINES  Aged Out    Health Maintenance  Health Maintenance Due  Topic Date Due  Zoster Vaccines- Shingrix (1 of 2) Never done   COVID-19 Vaccine (7 - 2023-24 season) 02/07/2022    Colorectal cancer screening: Type of screening: Colonoscopy. Completed yes. Repeat every 10 years  Lung Cancer Screening: (Low Dose CT Chest recommended if Age 7-80 years, 20 pack-year currently smoking OR have quit w/in 15years.) does not qualify.   Lung Cancer Screening Referral: no  Additional Screening:  Hepatitis C Screening: does not qualify; Completed yes  Vision Screening: Recommended annual ophthalmology exams for early detection of glaucoma and other disorders of the eye. Is the patient up to date with their annual eye exam?  Yes  Who is the provider or what is the name of the office in which the patient attends annual eye exams? Dr Mechele Collin If pt is not established with a provider, would they like to be referred to a provider to establish care? No .   Dental Screening: Recommended annual dental exams for proper oral hygiene  Diabetic Foot Exam:   Community Resource Referral /  Chronic Care Management: CRR required this visit?  No   CCM required this visit?  No     Plan:     I have personally reviewed and noted the following in the patient's chart:   Medical and social history Use of alcohol, tobacco or illicit drugs  Current medications and supplements including opioid prescriptions. Patient is not currently taking opioid prescriptions. Functional ability and status Nutritional status Physical activity Advanced directives List of other physicians Hospitalizations, surgeries, and ER visits in previous 12 months Vitals Screenings to include cognitive, depression, and falls Referrals and appointments  In addition, I have reviewed and discussed with patient certain preventive protocols, quality metrics, and best practice recommendations. A written personalized care plan for preventive services as well as general preventive health recommendations were provided to patient.     Sue Lush, LPN   [id number]/[id number]   After Visit Summary: (MyChart) Due to this being a telephonic visit, the after visit summary with patients personalized plan was offered to patient via MyChart   Nurse Notes: The patient states he is doing well and has no concerns or questions at this time.

## 2022-09-25 ENCOUNTER — Ambulatory Visit: Payer: Medicare Other | Admitting: Urology

## 2022-09-29 NOTE — Telephone Encounter (Signed)
Returned call to [employer]. Per rep, they are missing entire new enrollment form. They will refax  Chesley Mires, PharmD, MPH, BCPS, CPP Clinical Pharmacist (Rheumatology and Pulmonology)

## 2022-10-02 ENCOUNTER — Ambulatory Visit: Payer: Medicare Other | Admitting: Urology

## 2022-10-02 NOTE — Telephone Encounter (Signed)
Updated [employer] PAP application completed and faxed  Fax: [phone] Phone: [phone]  Chesley Mires, PharmD, MPH, BCPS, CPP Clinical Pharmacist (Rheumatology and Pulmonology)

## 2022-10-03 ENCOUNTER — Encounter: Payer: Self-pay | Admitting: Urology

## 2022-10-03 ENCOUNTER — Ambulatory Visit (INDEPENDENT_AMBULATORY_CARE_PROVIDER_SITE_OTHER): Payer: HMO | Admitting: Urology

## 2022-10-03 VITALS — BP 139/80 | HR 80 | Ht 72.0 in | Wt 193.0 lb

## 2022-10-03 DIAGNOSIS — N5201 Erectile dysfunction due to arterial insufficiency: Secondary | ICD-10-CM | POA: Diagnosis not present

## 2022-10-03 DIAGNOSIS — N401 Enlarged prostate with lower urinary tract symptoms: Secondary | ICD-10-CM

## 2022-10-03 DIAGNOSIS — E291 Testicular hypofunction: Secondary | ICD-10-CM | POA: Diagnosis not present

## 2022-10-03 IMAGING — US US EXTREM LOW VENOUS*L*
1 series · 13 of 24 positions shown · non-contrast
Comparison: April 01, 2019.

CLINICAL DATA: Lower extremity pain and edema

EXAM:
LEFT LOWER EXTREMITY VENOUS DUPLEX ULTRASOUND
TECHNIQUE: Gray-scale sonography with graded compression, as well as color
Doppler and duplex ultrasound were performed to evaluate the left
lower extremity deep venous system from the level of the common
femoral vein and including the common femoral, femoral, profunda
femoral, popliteal and calf veins including the posterior tibial,
peroneal and gastrocnemius veins when visible. The superficial great
saphenous vein was also interrogated. Spectral Doppler was utilized
to evaluate flow at rest and with distal augmentation maneuvers in
the common femoral, femoral and popliteal veins.

[Series 1: us extrem low venous*left* · 0.09mm/px · 13 of 33 slices shown]
[im 1/33]
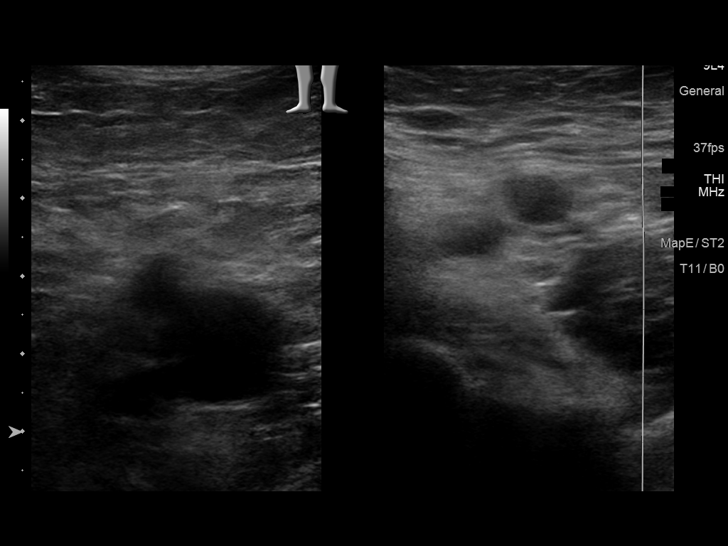
[im 3/33]
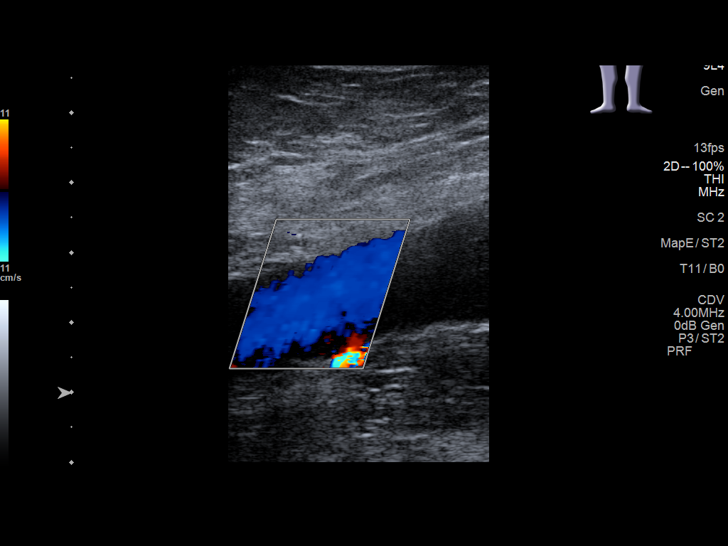
[im 6/33]
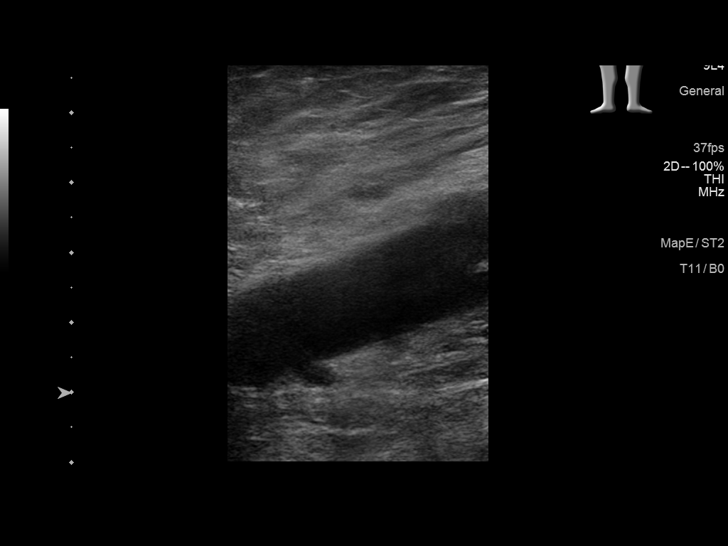
[im 9/33]
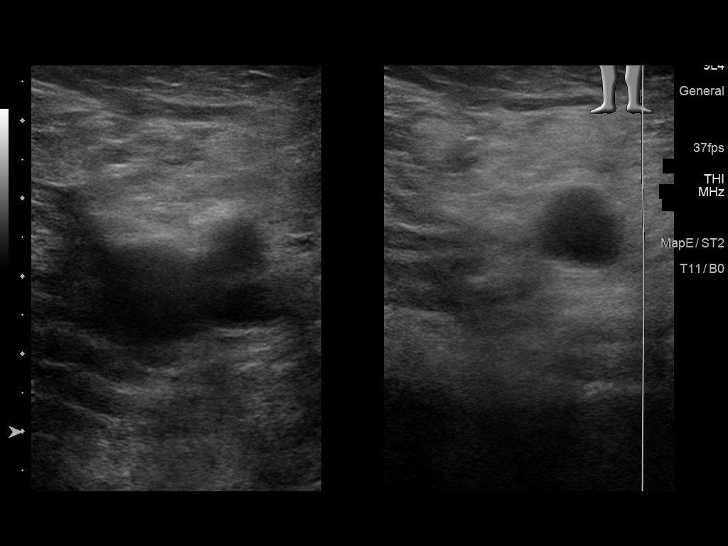
[im 12/33]
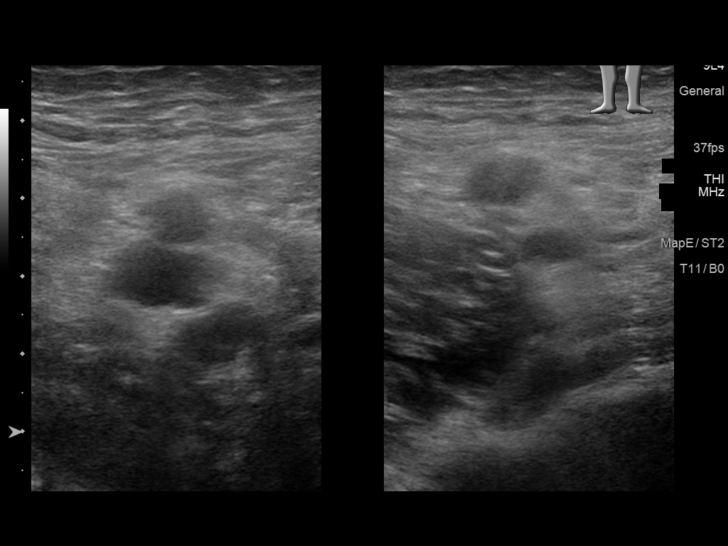
[im 14/33]
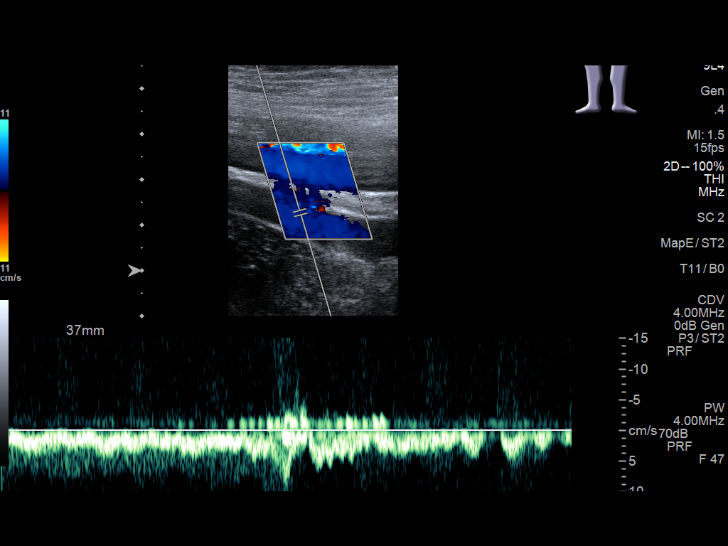
[im 17/33]
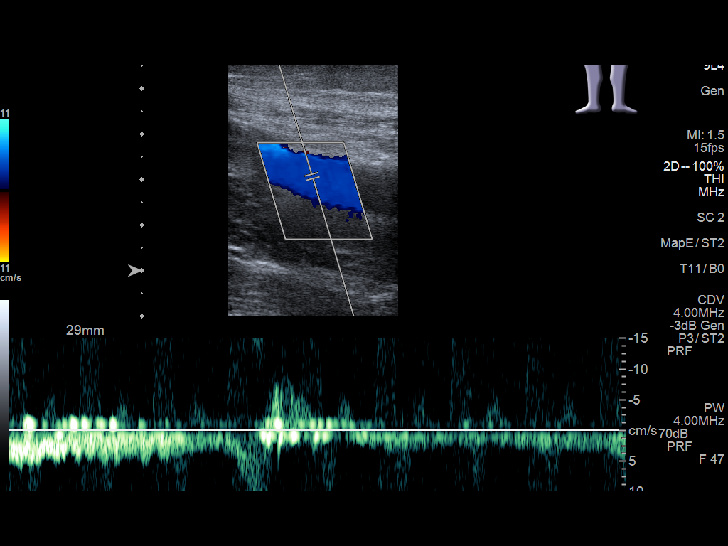
[im 19/33]
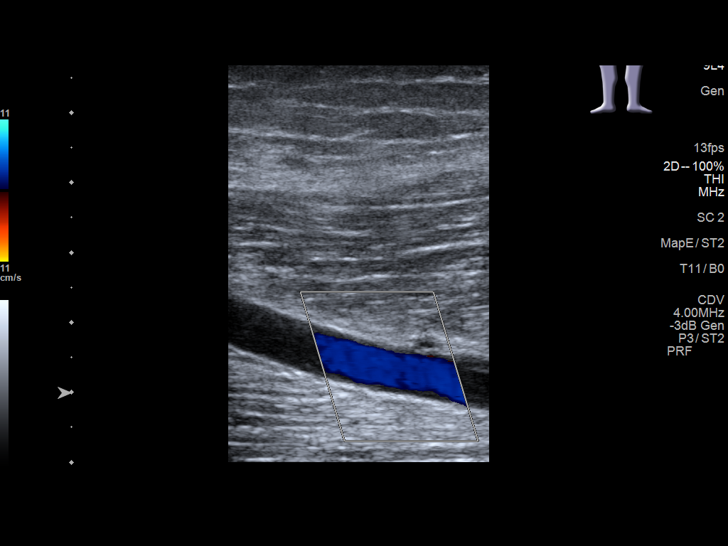
[im 21/33]
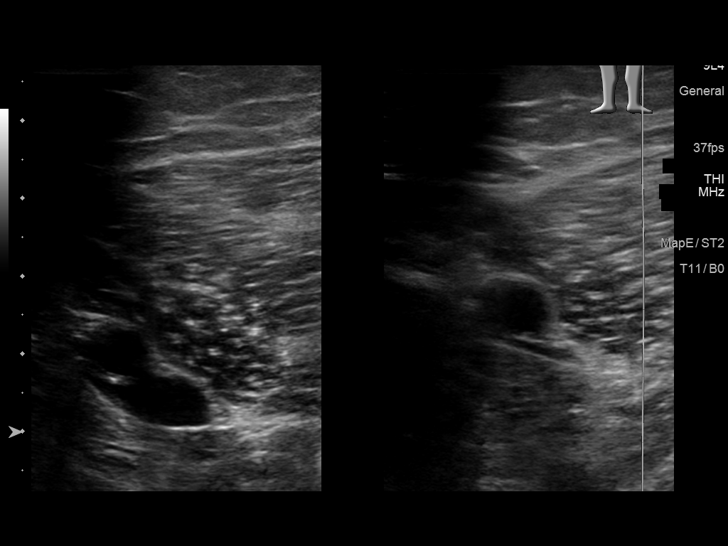
[im 24/33]
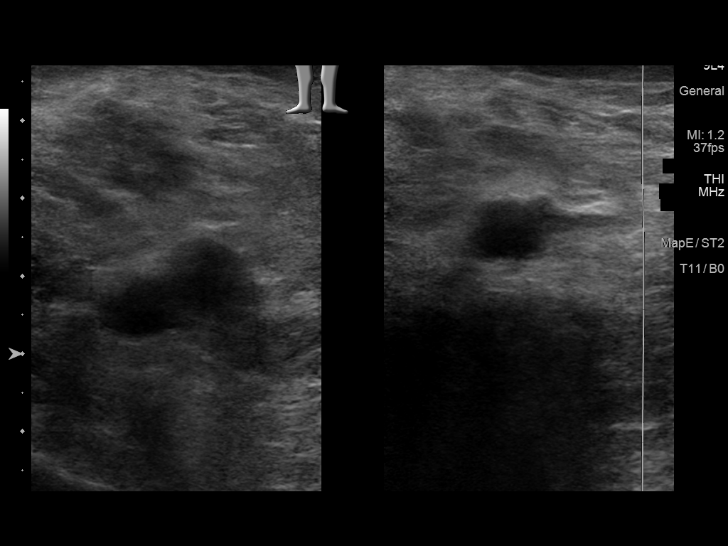
[im 27/33]
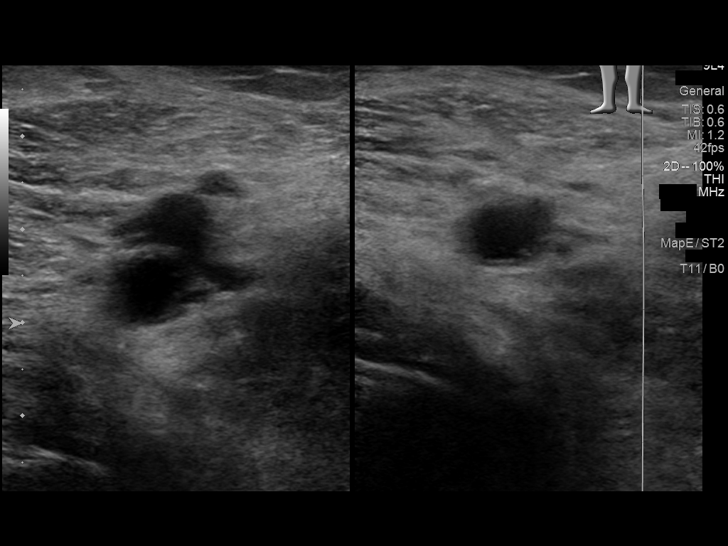
[im 30/33]
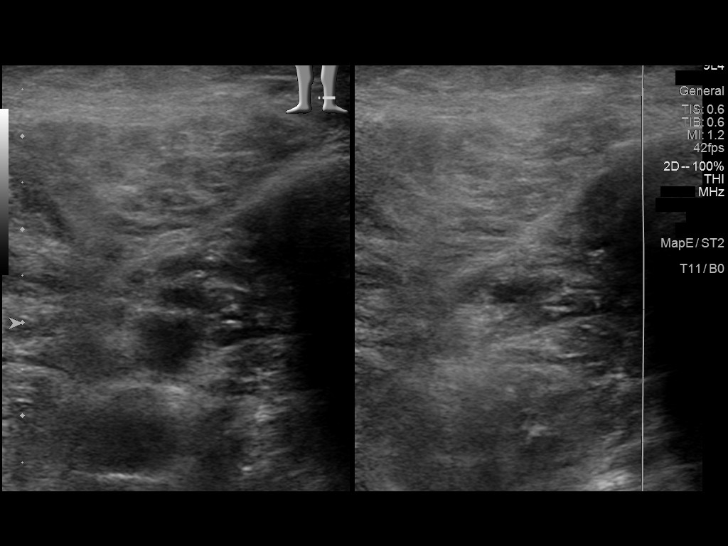
[im 33/33]
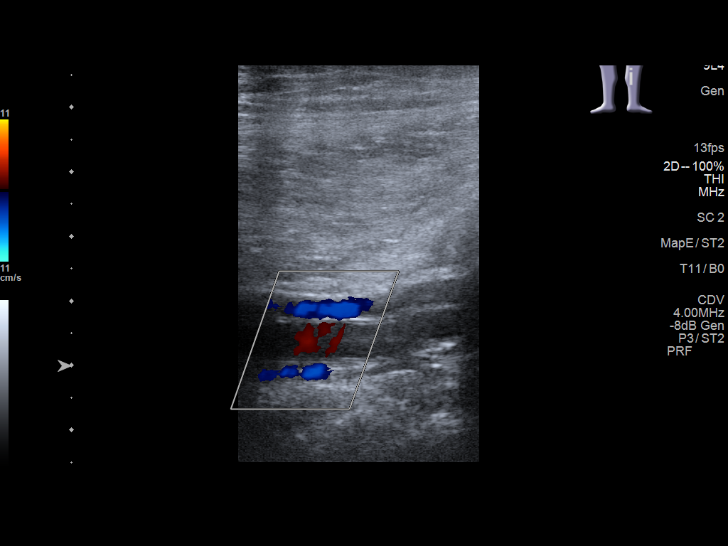

[13 of 24 positions shown; findings below may reference images not displayed]

FINDINGS: Contralateral Common Femoral Vein: Respiratory phasicity is normal
and symmetric with the symptomatic side. No evidence of thrombus.
Normal compressibility.

Common Femoral Vein: No evidence of thrombus. Normal
compressibility, respiratory phasicity and response to augmentation.

Saphenofemoral Junction: No evidence of thrombus. Normal
compressibility and flow on color Doppler imaging.

Profunda Femoral Vein: No evidence of thrombus. Normal
compressibility and flow on color Doppler imaging.

Femoral Vein: No evidence of thrombus. Normal compressibility,
respiratory phasicity and response to augmentation.

Popliteal Vein: No evidence of thrombus. Normal compressibility,
respiratory phasicity and response to augmentation.

Calf Veins: No evidence of thrombus. Normal compressibility and flow
on color Doppler imaging.

Superficial Great Saphenous Vein: No evidence of thrombus. Normal
compressibility.

Venous Reflux:  None.

Other Findings:  None.
IMPRESSION: No evidence of deep venous thrombosis in the left lower extremity.
Right common femoral vein also patent.

## 2022-10-03 NOTE — Progress Notes (Signed)
I, Maysun Anabel Bene, acting as a scribe for Riki Altes, MD., have documented all relevant documentation on the behalf of Riki Altes, MD, as directed by Riki Altes, MD while in the presence of Riki Altes, MD.  10/03/2022 1:12 PM   Precious Haws [birth year] [id number]  Referring provider: Bosie Clos, MD [address] [place],  [place] [address]  Chief Complaint  Patient presents with   Hypogonadism   Urologic history: 1.  Hypogonadism On topical TRT   2.  BPH with LUTS Dutasteride/tamsulosin   3.  Erectile dysfunction Tadalafil  HPI: [name] [name] is a 67 y.o. male presents for annual follow-up.  Doing well since last visit No bothersome LUTS Denies dysuria, gross hematuria Denies flank, abdominal or pelvic pain  Remains on dutasteride/tamsulosin/tadalafil.  Labs 09/19/22 testosterone 563, PSA 0.6, hematocrit 41.4  PSA trend   Prostate Specific Ag, Serum  Latest Ref Rng 0.0 - 4.0 ng/mL  12/29/2017 0.5   07/12/2018 0.5   07/15/2019 0.8   07/20/2020 0.5   09/17/2021 0.4   06/25/2022 0.5   09/19/2022 0.6      PMH: Past Medical History:  Diagnosis Date   Allergy    Arthritis    Knees & hands   Asthma    BP (high blood pressure) 11/07/2014   Should stop combination of Tekturna (DRI)  and ARB    Cataract 2018   Closed fracture of lateral malleolus of left fibula 07/23/2020   GERD (gastroesophageal reflux disease)    Hypertension    HYPERTENSION, SEVERE 03/15/2007   Qualifier: Diagnosis of  By: Vernie Murders     Prostate enlargement    Sleep apnea    Status post total knee replacement using cement, left 04/01/2019   Stress fracture of tibia and fibula 07/18/2020    Surgical History: Past Surgical History:  Procedure Laterality Date   APPENDECTOMY  1990   Dr Lemar Livings   COLONOSCOPY  2008   Dr Lemar Livings   COLONOSCOPY WITH PROPOFOL N/A 08/13/2016   Procedure: COLONOSCOPY WITH PROPOFOL;  Surgeon: Earline Mayotte,  MD;  Location: [facility];  Service: Endoscopy;  Laterality: N/A;   FRACTURE SURGERY  April 2023   Left Ankle   HEMORRHOID SURGERY     HERNIA REPAIR  01/20/2014   umbilical hernia   JOINT REPLACEMENT  Feb 2021   Left knee replacement   KNEE ARTHROSCOPY Left    KNEE SURGERY Left 1986   NASAL SEPTUM SURGERY     TONSILLECTOMY     TOTAL KNEE ARTHROPLASTY Left 03/29/2019   Procedure: TOTAL KNEE ARTHROPLASTY;  Surgeon: Christena Flake, MD;  Location: [facility] ORS;  Service: Orthopedics;  Laterality: Left;   VASECTOMY      Home Medications:  Allergies as of 10/03/2022       Reactions   Hydralazine Hives, Shortness Of Breath   Chest tightness, severe headache, dyspnea Other reaction(s): Chest Pain, Headache, Respiratory Distress   Gluten Meal Diarrhea   bloating   Moxifloxacin Hives   Avelox   Pseudoephedrine Other (See Comments)   Confusion, passed out, numbness   Sulfa Antibiotics Hives   Sulfonamide Derivatives Hives        Medication List        Accurate as of October 03, 2022  1:12 PM. If you have any questions, ask your nurse or doctor.          STOP taking these medications    hydrochlorothiazide 12.5  MG capsule Commonly known as: MICROZIDE Stopped by: Riki Altes   scopolamine 1 MG/3DAYS Commonly known as: TRANSDERM-SCOP Stopped by: Riki Altes       TAKE these medications    albuterol 108 (90 Base) MCG/ACT inhaler Commonly known as: VENTOLIN HFA Inhale 1-2 puffs into the lungs every 6 (six) hours as needed for wheezing or shortness of breath.   Calcium 600+D 600-800 MG-UNIT Tabs Generic drug: Calcium Carb-Cholecalciferol Take 1 tablet by mouth daily.   dexlansoprazole 60 MG capsule Commonly known as: Dexilant Take 1 capsule (60 mg total) by mouth daily.   dutasteride 0.5 MG capsule Commonly known as: AVODART Take 1 capsule (0.5 mg total) by mouth daily.   famotidine 40 MG tablet Commonly known as: PEPCID TAKE ONE TABLET BY MOUTH  DAILY What changed: when to take this   fluticasone 50 MCG/ACT nasal spray Commonly known as: FLONASE Place 1 spray into both nostrils daily.   fluticasone-salmeterol 500-50 MCG/ACT Aepb Commonly known as: ADVAIR Inhale 1 puff into the lungs in the morning and at bedtime.   Glucosamine Chondroitin Triple Tabs Take 1 tablet by mouth 2 (two) times daily.   guaiFENesin-codeine 100-10 MG/5ML syrup Commonly known as: ROBITUSSIN AC Take 5 mLs by mouth 3 (three) times daily as needed for cough.   ketotifen 0.025 % ophthalmic solution Commonly known as: ZADITOR Place 1 drop into both eyes daily.   losartan 100 MG tablet Commonly known as: COZAAR Take 1 tablet (100 mg total) by mouth daily.   Multi-Vitamins Tabs Take 1 tablet by mouth daily.   PreserVision AREDS 2 Caps Take 1 capsule by mouth 2 (two) times daily.   tadalafil 5 MG tablet Commonly known as: CIALIS Take 1 tablet (5 mg total) by mouth at bedtime.   tamsulosin 0.4 MG Caps capsule Commonly known as: FLOMAX TAKE ONE CAPSULE BY MOUTH ONE TIME DAILY   Turmeric 500 MG Caps Take 500 mg by mouth 2 (two) times daily.   verapamil 240 MG CR tablet Commonly known as: CALAN-SR TAKE ONE TABLET BY MOUTH TWICE A DAY        Allergies:  Allergies  Allergen Reactions   Hydralazine Hives and Shortness Of Breath    Chest tightness, severe headache, dyspnea Other reaction(s): Chest Pain, Headache, Respiratory Distress   Gluten Meal Diarrhea    bloating   Moxifloxacin Hives    Avelox   Pseudoephedrine Other (See Comments)    Confusion, passed out, numbness   Sulfa Antibiotics Hives   Sulfonamide Derivatives Hives    Family History: Family History  Problem Relation Age of Onset   Cancer Mother        ovarian   Glaucoma Mother    Cancer Father        prostate   Hypertension Sister    Asthma Sister    Alcohol abuse Paternal Uncle    Liver disease Paternal Uncle    Cancer - Prostate Paternal Uncle    Alcohol  abuse Paternal Uncle    Cancer Maternal Uncle        lung; two mat uncles   Cancer Paternal Grandmother        breast   Cancer Paternal Aunt        lung cancer in a smoker   Arthritis Maternal Grandfather    Arthritis Maternal Grandmother    ADD / ADHD Son    Anxiety disorder Son    Cancer Maternal Aunt    Cancer Maternal Uncle  Social History:  reports that he has never smoked. He has never used smokeless tobacco. He reports that he does not currently use alcohol. He reports that he does not use drugs.   Physical Exam: BP 139/80   Pulse 80   Ht 6' (1.829 m)   Wt 193 lb (87.5 kg)   BMI 26.18 kg/m   Constitutional:  Alert and oriented, No acute distress. HEENT: [place] AT, moist mucus membranes.  Trachea midline, no masses. Cardiovascular: No clubbing, cyanosis, or edema. Respiratory: Normal respiratory effort, no increased work of breathing. GI: Abdomen is soft, nontender, nondistended, no abdominal masses GU: Prostate 40 grams, smooth without nodules. Skin: No rashes, bruises or suspicious lesions. Neurologic: Grossly intact, no focal deficits, moving all 4 extremities. Psychiatric: Normal mood and affect.   Assessment & Plan:    1. Hypogonadism Lab visit 6 months with testosterone, H/H. Office visit 1 year with testosterone, H/H, PSA.   2. BPH with moderate LUTS  Stable on tamsulosin/dutasteride/tadalafil; did not need refills.   3. Erectile dysfunction Stable on tadalafil  [facility] [address], [address] [place], [place] [address] [phone]

## 2022-10-05 ENCOUNTER — Encounter: Payer: Self-pay | Admitting: Urology

## 2022-10-06 ENCOUNTER — Other Ambulatory Visit: Payer: Self-pay | Admitting: Urology

## 2022-10-06 ENCOUNTER — Other Ambulatory Visit: Payer: Self-pay

## 2022-10-06 ENCOUNTER — Telehealth: Payer: Self-pay | Admitting: Family Medicine

## 2022-10-06 DIAGNOSIS — I10 Essential (primary) hypertension: Secondary | ICD-10-CM

## 2022-10-06 IMAGING — CR DG ANKLE COMPLETE 3+V*L*
1 series · 3 of 3 positions shown · non-contrast
Comparison: None.

CLINICAL DATA: Ankle pain

EXAM:
LEFT ANKLE COMPLETE - 3+ VIEW

[Series 1: dg ankle complete left · 0.14mm/px · 3 of 3 slices shown]
[im 1/3]
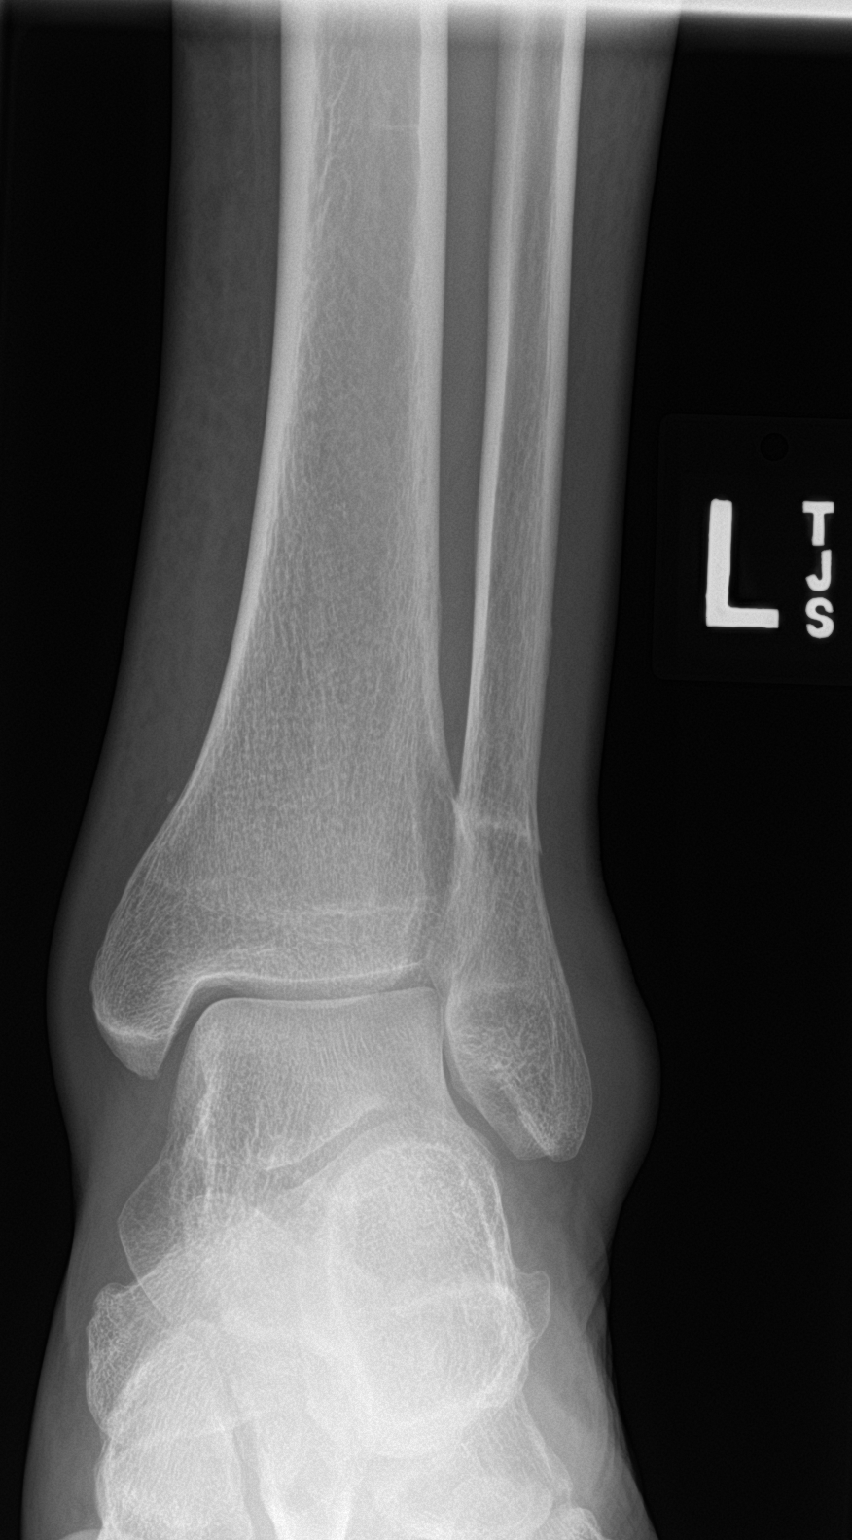
[im 2/3]
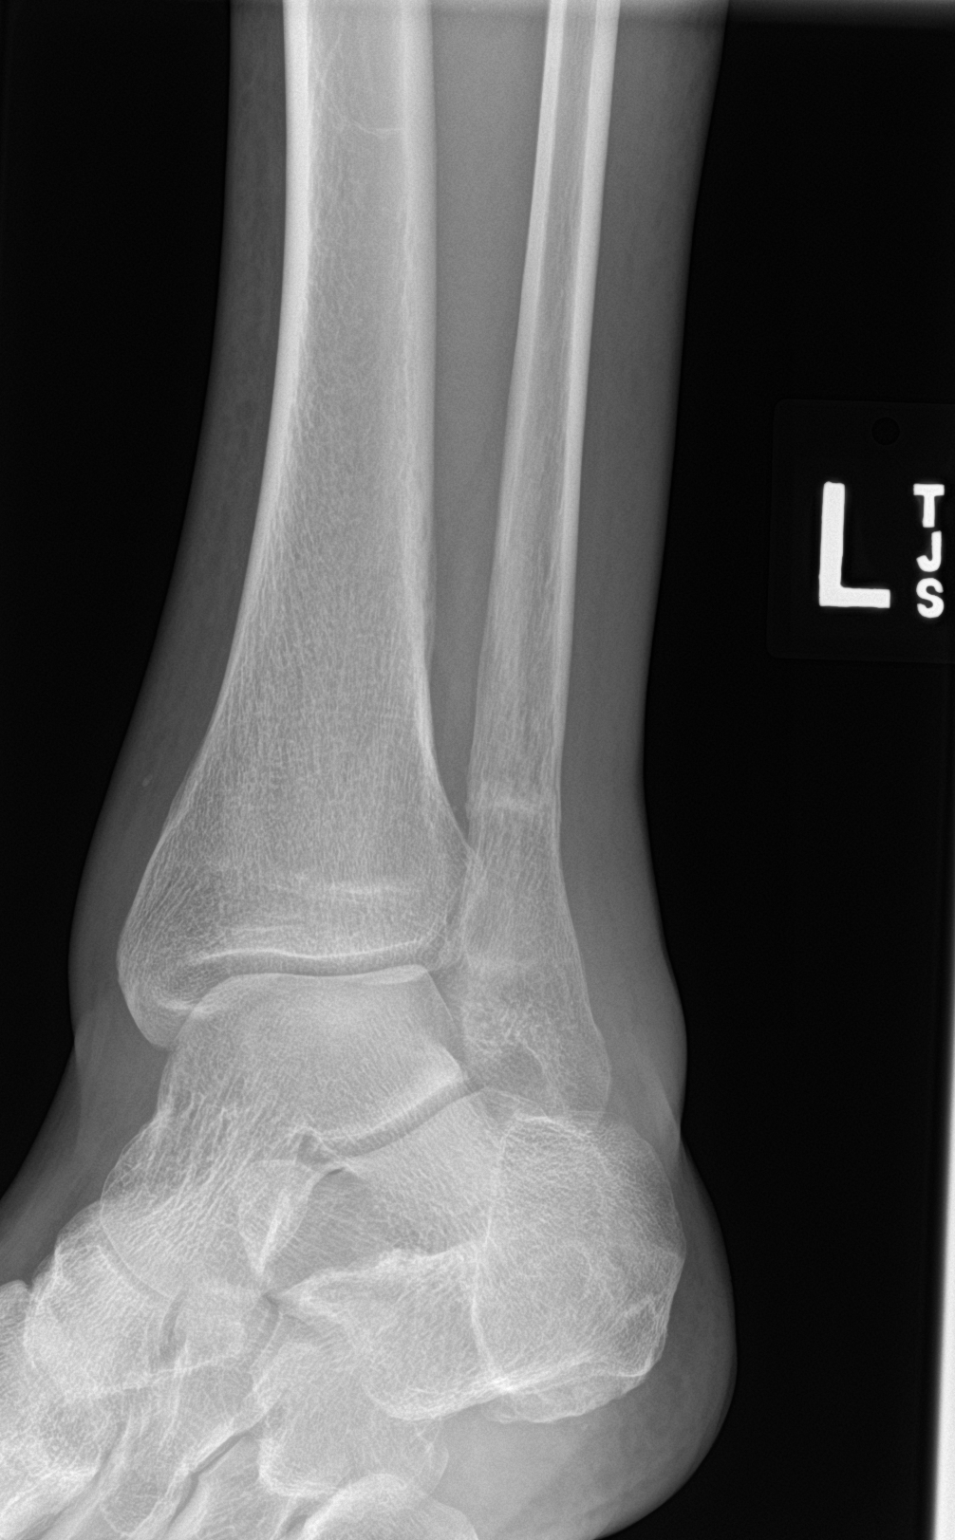
[im 3/3]
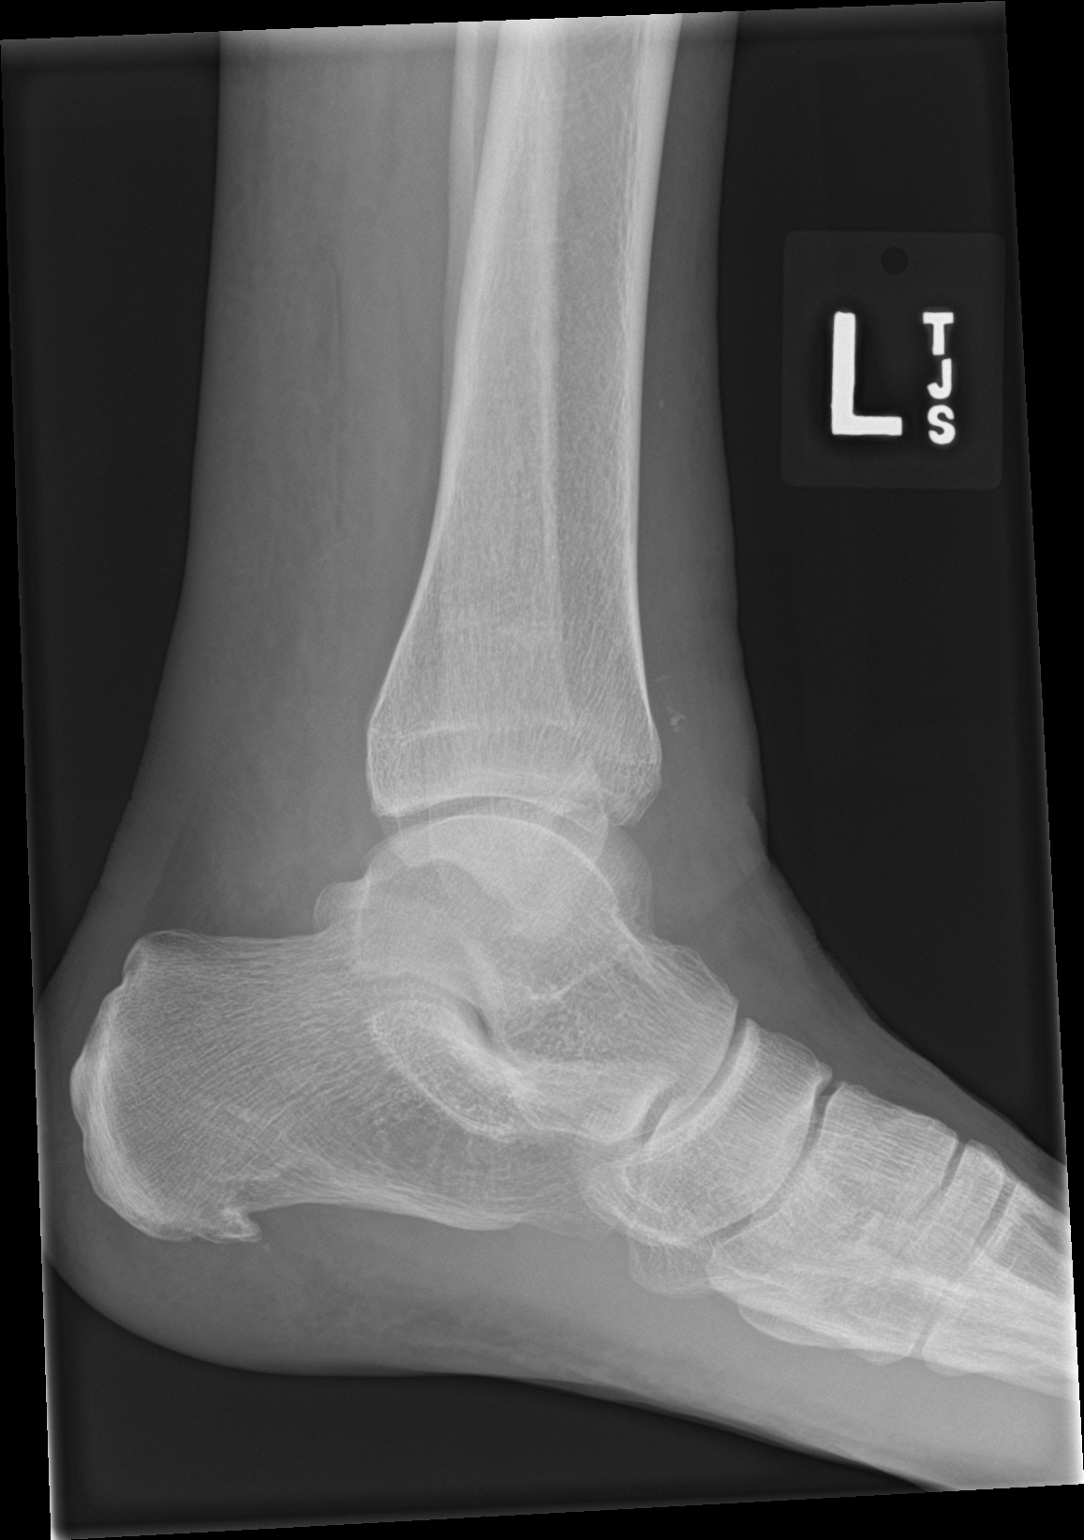

[3 of 3 positions shown; findings below may reference images not displayed]

FINDINGS: There is extensive soft tissue swelling about the ankle. There is a
subacute appearing nondisplaced fracture of the distal left fibula.
There is a moderate-sized plantar calcaneal spur.
IMPRESSION: 1. Subacute appearing nondisplaced fracture of the distal left
fibula.
2. Extensive soft tissue swelling about the ankle.

## 2022-10-06 MED ORDER — VERAPAMIL HCL ER 240 MG PO TBCR
240.0000 mg | EXTENDED_RELEASE_TABLET | Freq: Two times a day (BID) | ORAL | 1 refills | Status: DC
Start: 2022-10-06 — End: 2023-04-07

## 2022-10-06 NOTE — Telephone Encounter (Signed)
[facility] faxed refill request for the following medications:  verapamil (CALAN-SR) 240 MG CR tablet   Please advise.

## 2022-10-10 NOTE — Progress Notes (Unsigned)
      Established patient visit   Patient: [name] [name]   DOB: 1955-11-22   67 y.o. Male  MRN: [id number] Visit Date: 10/15/2022  Today's healthcare provider: [name], MD   No chief complaint on file.  Subjective      Lipoma- surg?    HTN with dizziness Held hydrochlorothiazide   Medications: Outpatient Medications Prior to Visit  Medication Sig   albuterol (VENTOLIN HFA) 108 (90 Base) MCG/ACT inhaler Inhale 1-2 puffs into the lungs every 6 (six) hours as needed for wheezing or shortness of breath.   Calcium Carb-Cholecalciferol (CALCIUM 600+D) 600-800 MG-UNIT TABS Take 1 tablet by mouth daily.   dexlansoprazole (DEXILANT) 60 MG capsule Take 1 capsule (60 mg total) by mouth daily.   dutasteride (AVODART) 0.5 MG capsule Take 1 capsule (0.5 mg total) by mouth daily.   famotidine (PEPCID) 40 MG tablet TAKE ONE TABLET BY MOUTH DAILY (Patient taking differently: Take 40 mg by mouth at bedtime.)   fluticasone (FLONASE) 50 MCG/ACT nasal spray Place 1 spray into both nostrils daily.   fluticasone-salmeterol (ADVAIR) 500-50 MCG/ACT AEPB Inhale 1 puff into the lungs in the morning and at bedtime.   guaiFENesin-codeine (ROBITUSSIN AC) 100-10 MG/5ML syrup Take 5 mLs by mouth 3 (three) times daily as needed for cough.   ketotifen (ZADITOR) 0.025 % ophthalmic solution Place 1 drop into both eyes daily.    losartan (COZAAR) 100 MG tablet Take 1 tablet (100 mg total) by mouth daily.   Misc Natural Products (GLUCOSAMINE CHONDROITIN TRIPLE) TABS Take 1 tablet by mouth 2 (two) times daily.   Multiple Vitamin (MULTI-VITAMINS) TABS Take 1 tablet by mouth daily.    Multiple Vitamins-Minerals (PRESERVISION AREDS 2) CAPS Take 1 capsule by mouth 2 (two) times daily.   tadalafil (CIALIS) 5 MG tablet Take 1 tablet (5 mg total) by mouth at bedtime.   tamsulosin (FLOMAX) 0.4 MG CAPS capsule TAKE ONE CAPSULE BY MOUTH ONE TIME DAILY   Testosterone 1.62 % GEL APPLY ONE PUMP TOPICALLY  TO EACH SHOUDER DAILY   Turmeric 500 MG CAPS Take 500 mg by mouth 2 (two) times daily.    verapamil (CALAN-SR) 240 MG CR tablet Take 1 tablet (240 mg total) by mouth 2 (two) times daily.   No facility-administered medications prior to visit.    Review of Systems  {Insert previous labs (optional):23779}  {See past labs  Heme  Chem  Endocrine  Serology  Results Review (optional):1}   Objective    There were no vitals taken for this visit. {Insert last BP/Wt (optional):23777}  {See vitals history (optional):1}  Physical Exam  ***  No results found for any visits on 10/15/22.  Assessment & Plan     Problem List Items Addressed This Visit   None    No follow-ups on file.         [name], MD  [facility] [phone] (phone) [phone] (fax)  [facility]

## 2022-10-15 ENCOUNTER — Ambulatory Visit: Payer: HMO | Admitting: Family Medicine

## 2022-10-15 ENCOUNTER — Encounter: Payer: Self-pay | Admitting: Family Medicine

## 2022-10-15 VITALS — BP 136/70 | HR 72 | Temp 97.6°F | Resp 12 | Ht 72.0 in | Wt 201.4 lb

## 2022-10-15 DIAGNOSIS — I10 Essential (primary) hypertension: Secondary | ICD-10-CM | POA: Diagnosis not present

## 2022-10-15 NOTE — Assessment & Plan Note (Signed)
Hypertension Chronic,controlled, at goal  Blood pressure readings have been within normal limits since retirement. No dizziness reported. -Continue losartan 100mg  daily and verapamil 240mg  twice daily

## 2022-10-21 ENCOUNTER — Other Ambulatory Visit: Payer: Self-pay | Admitting: Urology

## 2022-10-21 DIAGNOSIS — N401 Enlarged prostate with lower urinary tract symptoms: Secondary | ICD-10-CM

## 2022-10-28 NOTE — Telephone Encounter (Addendum)
Called [facility] for update on pt assistance application - per rep, application case was closed on because they did not receive EOB. Insurance did not disclose this information to [employer] and apparently Dorothea Ogle needs clinic to all insurance for explanation of benefits through pharmacy.  Additionally they made three outreach attempts to clinic which there is absolutely no documentation of from front desk or pharmacy team directly. I've reached out to [place], [facility] for clarification as we've only been told to provide medical benefits for pt assistance program screening  Phone: [phone]  Patient currently in coverage gap - Part D current OOP expenditure is is $2870.77 - remaining OOP cost is $923.23. Rep states they cannot fax this information and additionally cannot conference call with [facility] until patient provides consent to disclose to third-party.  Called patient to determine if any update on his end from company. He denies any outreach but provided him with update to ensure he is onboard with where we are with process. He verbalized understanidng  Chesley Mires, PharmD, MPH, BCPS, CPP Clinical Pharmacist (Rheumatology and Pulmonology)

## 2022-11-06 ENCOUNTER — Telehealth: Payer: Self-pay

## 2022-11-06 DIAGNOSIS — K219 Gastro-esophageal reflux disease without esophagitis: Secondary | ICD-10-CM

## 2022-11-06 DIAGNOSIS — K449 Diaphragmatic hernia without obstruction or gangrene: Secondary | ICD-10-CM

## 2022-11-06 NOTE — Telephone Encounter (Signed)
Copied from CRM [id number]. Topic: General - Other >> Nov 06, 2022  2:13 PM Franchot Heidelberg wrote: Reason for CRM: Pt called requesting a prior authorization for a hiatal hernia, he needs his PCP to copy what she did in March because that one has since expired.

## 2022-11-11 ENCOUNTER — Encounter: Payer: Self-pay | Admitting: Family Medicine

## 2022-11-11 ENCOUNTER — Telehealth: Payer: Self-pay | Admitting: Family Medicine

## 2022-11-11 DIAGNOSIS — K449 Diaphragmatic hernia without obstruction or gangrene: Secondary | ICD-10-CM | POA: Diagnosis not present

## 2022-11-11 DIAGNOSIS — K2100 Gastro-esophageal reflux disease with esophagitis, without bleeding: Secondary | ICD-10-CM | POA: Diagnosis not present

## 2022-11-11 NOTE — Telephone Encounter (Signed)
CT surgery referral has been placed for hiatal hernia repair scheduled for 12/18/22

## 2022-11-11 NOTE — Addendum Note (Signed)
Addended by: Bing Neighbors on: 11/11/2022 02:55 PM   Modules accepted: Orders

## 2022-11-11 NOTE — Telephone Encounter (Signed)
New CT referral for Dr Karie Georges at [facility] placed

## 2022-11-11 NOTE — Telephone Encounter (Signed)
Pt is calling to change referral from [facility] to [facility]. Pt reports that this is needing to get done quickly as his surgery 12/18/22. Please advise Cb- [phone]

## 2022-11-11 NOTE — Addendum Note (Signed)
Addended by: Bing Neighbors on: 11/11/2022 04:58 PM   Modules accepted: Orders

## 2022-11-12 NOTE — Telephone Encounter (Signed)
Referral changed to [facility] after reviewing patient's mychart message

## 2022-11-26 ENCOUNTER — Telehealth: Payer: Self-pay | Admitting: Student in an Organized Health Care Education/Training Program

## 2022-11-26 NOTE — Telephone Encounter (Signed)
[name] [name] from specialty [facility] is calling wanting to know if we've triaged the prescription [facility].

## 2022-11-28 ENCOUNTER — Telehealth: Payer: Self-pay | Admitting: Family Medicine

## 2022-11-28 NOTE — Telephone Encounter (Signed)
Pt is calling in because he needs a Pre-Authorization for a surgery that is scheduled for 12/18/22. Pt says he needs Dr. Roxan Hockey to reach out to [facility] [employer]'s benefits and eligibility department as well as fill out a form stating the surgery is medically necessary. Pt says he needs this because he wants to have the surgery done at [facility] and it's OOO but the insurance will cover it if it's medically necessary.

## 2022-11-28 NOTE — Telephone Encounter (Signed)
Please contact patient for additional information regarding request:   Will/has his surgeon be able to provide(d)information regarding medical necessity for the pre-authorization process?  (If no to #1) Is there a contact number and or reference number for this process?  Does the patient have access to this form requesting details of medical necessity?

## 2022-11-28 NOTE — Telephone Encounter (Signed)
Called contact number to discuss medical necessity of upcoming hiatal hernia surgery.   Reached after hours line.  Requested form to complete to support medical necessity of surgery with surgeon who is in [facility].   If letter is needed, please use letter created in communications on 11/28/22   [name] Ramp, MD  [facility]

## 2022-11-28 NOTE — Telephone Encounter (Signed)
Colin Mulders is calling again about prescription [facility], wanting to know if they need to triage the prescription for the patient.

## 2022-11-29 ENCOUNTER — Ambulatory Visit
Admission: EM | Admit: 2022-11-29 | Discharge: 2022-11-29 | Disposition: A | Discharge status: Home / Self Care | Payer: HMO | Attending: Emergency Medicine | Admitting: Emergency Medicine

## 2022-11-29 DIAGNOSIS — J4551 Severe persistent asthma with (acute) exacerbation: Secondary | ICD-10-CM

## 2022-11-29 DIAGNOSIS — J0100 Acute maxillary sinusitis, unspecified: Secondary | ICD-10-CM

## 2022-11-29 MED ORDER — PREDNISONE 10 MG PO TABS: 40.0000 mg | ORAL_TABLET | Freq: Every day | ORAL | 0 refills | Status: AC

## 2022-11-29 MED ORDER — DOXYCYCLINE HYCLATE 100 MG PO CAPS: 100.0000 mg | ORAL_CAPSULE | Freq: Two times a day (BID) | ORAL | 0 refills | Status: AC

## 2022-11-29 NOTE — Discharge Instructions (Addendum)
Take the doxycycline and prednisone as directed.  Continue your asthma inhalers and treatments as directed.  Follow-up with your primary care provider or pulmonologist on Monday.  Go to the emergency department if you have worsening symptoms.

## 2022-11-29 NOTE — ED Provider Notes (Signed)
[name] [name]    CSN: [id number] Arrival date & time: 11/29/22  4098      History   Chief Complaint Chief Complaint  Patient presents with   Cough   Headache   Nasal Congestion    HPI [name] [name] is a 67 y.o. male.  Patient presents with 1 week history of congestion, postnasal drip, cough productive of green mucus, headache.  Treatment attempted with Mucinex and inhalers.  No fever, chest pain, shortness of breath, or other symptoms.  He has an appointment with his pulmonologist on 12/03/2022.  His medical history includes asthma, chronic cough, allergic rhinitis, hypertension.  Patient was last seen by his pulmonologist on 09/01/2022; diagnosed with severe persistent asthma.  The history is provided by the patient and medical records.    Past Medical History:  Diagnosis Date   Allergy    Arthritis    Knees & hands   Asthma    BP (high blood pressure) 11/07/2014   Should stop combination of Tekturna (DRI)  and ARB    Cataract 2018   Closed fracture of lateral malleolus of left fibula 07/23/2020   GERD (gastroesophageal reflux disease)    Hypertension    HYPERTENSION, SEVERE 03/15/2007   Qualifier: Diagnosis of  By: Vernie Murders     Prostate enlargement    Sleep apnea    Status post total knee replacement using cement, left 04/01/2019   Stress fracture of tibia and fibula 07/18/2020    Patient Active Problem List   Diagnosis Date Noted   Subcutaneous nodule of right upper extremity 09/19/2022   Moderate persistent asthma with exacerbation 06/24/2022   Syncope and collapse 06/18/2022   Hiatal hernia 03/20/2022   Refractory chronic cough 01/02/2022   Essential hypertension 12/19/2021   Severe persistent asthma 09/26/2021   Osteopenia 07/23/2020   Vitamin D deficiency 07/23/2020   Erectile dysfunction due to arterial insufficiency 07/15/2018   Primary osteoarthritis of left knee 12/10/2015   Allergic rhinitis 11/07/2014   Benign prostatic  hyperplasia with lower urinary tract symptoms 11/07/2014   Hypogonadism in male 11/07/2014   Obstructive apnea 11/07/2014   Plantar fasciitis 11/07/2014   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   Esophageal reflux 03/15/2007   COUGH, CHRONIC 03/15/2007    Past Surgical History:  Procedure Laterality Date   APPENDECTOMY  1990   Dr Lemar Livings   COLONOSCOPY  2008   Dr Lemar Livings   COLONOSCOPY WITH PROPOFOL N/A 08/13/2016   Procedure: COLONOSCOPY WITH PROPOFOL;  Surgeon: Earline Mayotte, MD;  Location: [facility] ENDOSCOPY;  Service: Endoscopy;  Laterality: N/A;   FRACTURE SURGERY  April 2023   Left Ankle   HEMORRHOID SURGERY     HERNIA REPAIR  01/20/2014   umbilical hernia   JOINT REPLACEMENT  Feb 2021   Left knee replacement   KNEE ARTHROSCOPY Left    KNEE SURGERY Left 1986   NASAL SEPTUM SURGERY     TONSILLECTOMY     TOTAL KNEE ARTHROPLASTY Left 03/29/2019   Procedure: TOTAL KNEE ARTHROPLASTY;  Surgeon: Christena Flake, MD;  Location: [facility] ORS;  Service: Orthopedics;  Laterality: Left;   VASECTOMY         Home Medications    Prior to Admission medications   Medication Sig Start Date End Date Taking? Authorizing Provider  doxycycline (VIBRAMYCIN) 100 MG capsule Take 1 capsule (100 mg total) by mouth 2 (two) times daily for 7 days. 11/29/22 12/06/22 Yes Mickie Bail, NP  predniSONE (DELTASONE) 10  MG tablet Take 4 tablets (40 mg total) by mouth daily for 5 days. 11/29/22 12/04/22 Yes Mickie Bail, NP  albuterol (VENTOLIN HFA) 108 (90 Base) MCG/ACT inhaler Inhale 1-2 puffs into the lungs every 6 (six) hours as needed for wheezing or shortness of breath. 02/10/22   Simmons-Robinson, Makiera, MD  Calcium Carb-Cholecalciferol (CALCIUM 600+D) 600-800 MG-UNIT TABS Take 1 tablet by mouth daily.    [provider]  dexlansoprazole (DEXILANT) 60 MG capsule Take 1 capsule (60 mg total) by mouth daily. 06/09/22   Simmons-Robinson, Makiera, MD  dutasteride (AVODART) 0.5 MG  capsule TAKE ONE CAPSULE BY MOUTH ONE TIME DAILY 10/22/22   Stoioff, Verna Czech, MD  famotidine (PEPCID) 40 MG tablet TAKE ONE TABLET BY MOUTH DAILY Patient taking differently: Take 40 mg by mouth at bedtime. 04/28/18   Bosie Clos, MD  fluticasone (FLONASE) 50 MCG/ACT nasal spray Place 1 spray into both nostrils daily. 11/19/21 11/19/22  Raechel Chute, MD  fluticasone-salmeterol (ADVAIR) 500-50 MCG/ACT AEPB Inhale 1 puff into the lungs in the morning and at bedtime. 07/29/22   Raechel Chute, MD  ketotifen (ZADITOR) 0.025 % ophthalmic solution Place 1 drop into both eyes daily.     [provider]  losartan (COZAAR) 100 MG tablet Take 1 tablet (100 mg total) by mouth daily. 06/30/22   Simmons-Robinson, Tawanna Cooler, MD  Misc Natural Products (GLUCOSAMINE CHONDROITIN TRIPLE) TABS Take 1 tablet by mouth 2 (two) times daily.    [provider]  Multiple Vitamin (MULTI-VITAMINS) TABS Take 1 tablet by mouth daily.     [provider]  Multiple Vitamins-Minerals (PRESERVISION AREDS 2) CAPS Take 1 capsule by mouth 2 (two) times daily.    [provider]  tadalafil (CIALIS) 5 MG tablet TAKE ONE TABLET BY MOUTH AT BEDTIME 10/22/22   Stoioff, Verna Czech, MD  tamsulosin (FLOMAX) 0.4 MG CAPS capsule TAKE ONE CAPSULE BY MOUTH ONE TIME DAILY 09/22/22   Stoioff, Verna Czech, MD  Testosterone 1.62 % GEL APPLY ONE PUMP TOPICALLY TO EACH SHOUDER DAILY 10/06/22   Stoioff, Verna Czech, MD  Turmeric 500 MG CAPS Take 500 mg by mouth 2 (two) times daily.     [provider]  verapamil (CALAN-SR) 240 MG CR tablet Take 1 tablet (240 mg total) by mouth 2 (two) times daily. 10/06/22   Simmons-Robinson, Tawanna Cooler, MD    Family History Family History  Problem Relation Age of Onset   Cancer Mother        ovarian   Glaucoma Mother    Cancer Father        prostate   Hypertension Sister    Asthma Sister    Alcohol abuse Paternal Uncle    Liver disease Paternal Uncle    Cancer - Prostate Paternal  Uncle    Alcohol abuse Paternal Uncle    Cancer Maternal Uncle        lung; two mat uncles   Cancer Paternal Grandmother        breast   Cancer Paternal Aunt        lung cancer in a smoker   Arthritis Maternal Grandfather    Arthritis Maternal Grandmother    ADD / ADHD Son    Anxiety disorder Son    Cancer Maternal Aunt    Cancer Maternal Uncle     Social History Social History   Tobacco Use   Smoking status: Never   Smokeless tobacco: Never   Tobacco comments:    2nd hand smoke growing  up  Substance Use Topics   Alcohol use: Not Currently    Comment: occasionally   Drug use: Never     Allergies   Hydralazine, Gluten meal, Moxifloxacin, Pseudoephedrine, Sulfa antibiotics, and Sulfonamide derivatives   Review of Systems Review of Systems  Constitutional:  Negative for chills and fever.  HENT:  Positive for congestion and postnasal drip. Negative for ear pain and sore throat.   Respiratory:  Positive for cough. Negative for shortness of breath.   Cardiovascular:  Negative for chest pain and palpitations.     Physical Exam Triage Vital Signs ED Triage Vitals  Encounter Vitals Group     BP 11/29/22 1026 (!) 143/80     Systolic BP Percentile --      Diastolic BP Percentile --      Pulse Rate 11/29/22 1022 86     Resp 11/29/22 1022 18     Temp 11/29/22 1022 97.8 F (36.6 C)     Temp src --      SpO2 11/29/22 1022 97 %     Weight --      Height --      Head Circumference --      Peak Flow --      Pain Score 11/29/22 1023 3     Pain Loc --      Pain Education --      Exclude from Growth Chart --    No data found.  Updated Vital Signs BP (!) 143/80   Pulse 86   Temp 97.8 F (36.6 C)   Resp 18   SpO2 97%   Visual Acuity Right Eye Distance:   Left Eye Distance:   Bilateral Distance:    Right Eye Near:   Left Eye Near:    Bilateral Near:     Physical Exam Vitals and nursing note reviewed.  Constitutional:      General: He is not in acute  distress.    Appearance: He is well-developed.  HENT:     Right Ear: Tympanic membrane normal.     Left Ear: Tympanic membrane normal.     Nose: Congestion present.     Mouth/Throat:     Mouth: Mucous membranes are moist.     Pharynx: Oropharynx is clear.  Cardiovascular:     Rate and Rhythm: Normal rate and regular rhythm.     Heart sounds: Normal heart sounds.  Pulmonary:     Effort: Pulmonary effort is normal. No respiratory distress.     Breath sounds: Wheezing present.  Musculoskeletal:     Cervical back: Neck supple.  Skin:    General: Skin is warm and dry.  Neurological:     Mental Status: He is alert.      UC Treatments / Results  Labs (all labs ordered are listed, but only abnormal results are displayed) Labs Reviewed - No data to display  EKG   Radiology No results found.  Procedures Procedures (including critical care time)  Medications Ordered in UC Medications - No data to display  Initial Impression / Assessment and Plan / UC Course  I have reviewed the triage vital signs and the nursing notes.  Pertinent labs & imaging results that were available during my care of the patient were reviewed by me and considered in my medical decision making (see chart for details).    Severe persistent asthma exacerbation, acute sinusitis.  Patient declines chest x-ray or COVID test.  Negative COVID test at home this morning.  No acute respiratory distress.  O2 sat 97% on room air.  Treating today with doxycycline and prednisone.  Instructed him to continue his albuterol and other asthma treatments.  Instructed him to follow-up with his PCP or pulmonologist on Monday.  He has an appointment with his pulmonologist on 12/03/2022.  ED precautions discussed.  Education provided on asthma and sinus infection.  He agrees to plan of care.  Final Clinical Impressions(s) / UC Diagnoses   Final diagnoses:  Severe persistent asthma with acute exacerbation  Acute non-recurrent  maxillary sinusitis     Discharge Instructions      Take the doxycycline and prednisone as directed.  Continue your asthma inhalers and treatments as directed.  Follow-up with your primary care provider or pulmonologist on Monday.  Go to the emergency department if you have worsening symptoms.     ED Prescriptions     Medication Sig Dispense Auth. Provider   doxycycline (VIBRAMYCIN) 100 MG capsule Take 1 capsule (100 mg total) by mouth 2 (two) times daily for 7 days. 14 capsule Mickie Bail, NP   predniSONE (DELTASONE) 10 MG tablet Take 4 tablets (40 mg total) by mouth daily for 5 days. 20 tablet Mickie Bail, NP      PDMP not reviewed this encounter.   Mickie Bail, NP 11/29/22 1051

## 2022-11-29 NOTE — ED Triage Notes (Signed)
Patient to Urgent Care with complaints of cough/ headaches/ nasal congestion.  Reports symptoms started earlier this week. Reports cough is productive with green mucus. Also with greens nasal drainage.  Denies any fevers.  Has been taking Mucinex-DM.

## 2022-12-03 ENCOUNTER — Ambulatory Visit: Payer: HMO | Admitting: Student in an Organized Health Care Education/Training Program

## 2022-12-03 ENCOUNTER — Encounter: Payer: Self-pay | Admitting: Student in an Organized Health Care Education/Training Program

## 2022-12-03 VITALS — BP 122/68 | HR 80 | Temp 98.3°F | Ht 72.0 in | Wt 198.4 lb

## 2022-12-03 DIAGNOSIS — R0602 Shortness of breath: Secondary | ICD-10-CM | POA: Diagnosis not present

## 2022-12-03 DIAGNOSIS — J4550 Severe persistent asthma, uncomplicated: Secondary | ICD-10-CM | POA: Diagnosis not present

## 2022-12-03 LAB — NITRIC OXIDE: Nitric Oxide: 20

## 2022-12-03 NOTE — Progress Notes (Unsigned)
Marland Kitchen  hfa

## 2022-12-03 NOTE — Progress Notes (Unsigned)
Assessment & Plan:   1. SOB (shortness of breath)  Feels better now. 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 Had an exacerbation, given prednisone. Presents for follow up, on advair 500. He is scheduled for nissen fundoplication surgery next month.  - Nitric oxide   Return in about 4 months (around 04/04/2023).  I spent *** minutes caring for this patient today, including {EM billing:[id number]}  Raechel Chute, MD [facility] 12/03/2022 3:22 PM    End of visit medications:  No orders of the defined types were placed in this encounter.    Current Outpatient Medications:    albuterol (VENTOLIN HFA) 108 (90 Base) MCG/ACT inhaler, Inhale 1-2 puffs into the lungs every 6 (six) hours as needed for wheezing or shortness of breath., Disp: 18 g, Rfl: 3   Calcium Carb-Cholecalciferol (CALCIUM 600+D) 600-800 MG-UNIT TABS, Take 1 tablet by mouth daily., Disp: , Rfl:    dexlansoprazole (DEXILANT) 60 MG capsule, Take 1 capsule (60 mg total) by mouth daily., Disp: 90 capsule, Rfl: 1   doxycycline (VIBRAMYCIN) 100 MG capsule, Take 1 capsule (100 mg total) by mouth 2 (two) times daily for 7 days., Disp: 14 capsule, Rfl: 0   dutasteride (AVODART) 0.5 MG capsule, TAKE ONE CAPSULE BY MOUTH ONE TIME DAILY, Disp: 90 capsule, Rfl: 3   famotidine (PEPCID) 40 MG tablet, TAKE ONE TABLET BY MOUTH DAILY (Patient taking differently: Take 40 mg by mouth at bedtime.), Disp: 30 tablet, Rfl: 11   fluticasone-salmeterol (ADVAIR) 500-50 MCG/ACT AEPB, Inhale 1 puff into the lungs in the morning and at bedtime., Disp: 60 each, Rfl: 6   ketotifen (ZADITOR) 0.025 % ophthalmic solution, Place 1 drop into both eyes daily. , Disp: , Rfl:    losartan (COZAAR) 100 MG tablet, Take 1 tablet (100 mg total) by mouth daily., Disp: 90 tablet, Rfl: 1   Misc Natural Products (GLUCOSAMINE CHONDROITIN TRIPLE) TABS, Take 1 tablet by mouth 2 (two) times daily., Disp: , Rfl:    Multiple Vitamin (MULTI-VITAMINS)  TABS, Take 1 tablet by mouth daily. , Disp: , Rfl:    Multiple Vitamins-Minerals (PRESERVISION AREDS 2) CAPS, Take 1 capsule by mouth 2 (two) times daily., Disp: , Rfl:    predniSONE (DELTASONE) 10 MG tablet, Take 4 tablets (40 mg total) by mouth daily for 5 days., Disp: 20 tablet, Rfl: 0   tadalafil (CIALIS) 5 MG tablet, TAKE ONE TABLET BY MOUTH AT BEDTIME, Disp: 90 tablet, Rfl: 3   tamsulosin (FLOMAX) 0.4 MG CAPS capsule, TAKE ONE CAPSULE BY MOUTH ONE TIME DAILY, Disp: 90 capsule, Rfl: 3   Testosterone 1.62 % GEL, APPLY ONE PUMP TOPICALLY TO EACH SHOUDER DAILY, Disp: 75 g, Rfl: 2   Turmeric 500 MG CAPS, Take 500 mg by mouth 2 (two) times daily. , Disp: , Rfl:    verapamil (CALAN-SR) 240 MG CR tablet, Take 1 tablet (240 mg total) by mouth 2 (two) times daily., Disp: 180 tablet, Rfl: 1   fluticasone (FLONASE) 50 MCG/ACT nasal spray, Place 1 spray into both nostrils daily., Disp: 18.2 mL, Rfl: 11   Subjective:   PATIENT ID: [name] [name] GENDER: male DOB: February 16, 1956, MRN: [id number]  Chief Complaint  Patient presents with   Follow-up    Currently taking doxy and pred for sinusitis. Prod cough with clear sputum.     HPI ***  Ancillary information including prior medications, full medical/surgical/family/social histories, and PFTs (when available) are listed below and have been reviewed.   ROS  Objective:   Vitals:   12/03/22 1134  BP: 122/68  Pulse: 80  Temp: 98.3 F (36.8 C)  TempSrc: Oral  SpO2: 98%  Weight: 198 lb 6.4 oz (90 kg)  Height: 6' (1.829 m)   98% on *** LPM *** RA BMI Readings from Last 3 Encounters:  12/03/22 26.91 kg/m  10/15/22 27.31 kg/m  10/03/22 26.18 kg/m   Wt Readings from Last 3 Encounters:  12/03/22 198 lb 6.4 oz (90 kg)  10/15/22 201 lb 6.4 oz (91.4 kg)  10/03/22 193 lb (87.5 kg)    Physical Exam    Ancillary Information    Past Medical History:  Diagnosis Date   Allergy    Arthritis    Knees & hands   Asthma    BP  (high blood pressure) 11/07/2014   Should stop combination of Tekturna (DRI)  and ARB    Cataract 2018   Closed fracture of lateral malleolus of left fibula 07/23/2020   GERD (gastroesophageal reflux disease)    Hypertension    HYPERTENSION, SEVERE 03/15/2007   Qualifier: Diagnosis of  By: Vernie Murders     Prostate enlargement    Sleep apnea    Status post total knee replacement using cement, left 04/01/2019   Stress fracture of tibia and fibula 07/18/2020     Family History  Problem Relation Age of Onset   Cancer Mother        ovarian   Glaucoma Mother    Cancer Father        prostate   Hypertension Sister    Asthma Sister    Alcohol abuse Paternal Uncle    Liver disease Paternal Uncle    Cancer - Prostate Paternal Uncle    Alcohol abuse Paternal Uncle    Cancer Maternal Uncle        lung; two mat uncles   Cancer Paternal Grandmother        breast   Cancer Paternal Aunt        lung cancer in a smoker   Arthritis Maternal Grandfather    Arthritis Maternal Grandmother    ADD / ADHD Son    Anxiety disorder Son    Cancer Maternal Aunt    Cancer Maternal Uncle      Past Surgical History:  Procedure Laterality Date   APPENDECTOMY  1990   Dr Lemar Livings   COLONOSCOPY  2008   Dr Lemar Livings   COLONOSCOPY WITH PROPOFOL N/A 08/13/2016   Procedure: COLONOSCOPY WITH PROPOFOL;  Surgeon: Earline Mayotte, MD;  Location: [facility] ENDOSCOPY;  Service: Endoscopy;  Laterality: N/A;   FRACTURE SURGERY  April 2023   Left Ankle   HEMORRHOID SURGERY     HERNIA REPAIR  01/20/2014   umbilical hernia   JOINT REPLACEMENT  Feb 2021   Left knee replacement   KNEE ARTHROSCOPY Left    KNEE SURGERY Left 1986   NASAL SEPTUM SURGERY     TONSILLECTOMY     TOTAL KNEE ARTHROPLASTY Left 03/29/2019   Procedure: TOTAL KNEE ARTHROPLASTY;  Surgeon: Christena Flake, MD;  Location: [facility] ORS;  Service: Orthopedics;  Laterality: Left;   VASECTOMY      Social History   Socioeconomic History   Marital  status: Married    Spouse name: Not on file   Number of children: Not on file   Years of education: Not on file   Highest education level: Master's degree (e.g., MA, MS, MEng, MEd, MSW, MBA)  Occupational History   Not  on file  Tobacco Use   Smoking status: Never   Smokeless tobacco: Never   Tobacco comments:    2nd hand smoke growing up  Substance and Sexual Activity   Alcohol use: Not Currently    Comment: occasionally   Drug use: Never   Sexual activity: Yes    Birth control/protection: Surgical  Other Topics Concern   Not on file  Social History Narrative   Not on file   Social Determinants of Health   Financial Resource Strain: Patient Declined (09/22/2022)   Overall Financial Resource Strain (CARDIA)    Difficulty of Paying Living Expenses: Patient declined  Food Insecurity: No Food Insecurity (09/22/2022)   Hunger Vital Sign    Worried About Running Out of Food in the Last Year: Never true    Ran Out of Food in the Last Year: Never true  Transportation Needs: No Transportation Needs (09/22/2022)   PRAPARE - Administrator, Civil Service (Medical): No    Lack of Transportation (Non-Medical): No  Physical Activity: Patient Declined (09/22/2022)   Exercise Vital Sign    Days of Exercise per Week: Patient declined    Minutes of Exercise per Session: Patient declined  Stress: Patient Declined (09/22/2022)   [employer] of Occupational Health - Occupational Stress Questionnaire    Feeling of Stress : Patient declined  Social Connections: Unknown (09/22/2022)   Social Connection and Isolation Panel [NHANES]    Frequency of Communication with Friends and Family: Patient declined    Frequency of Social Gatherings with Friends and Family: Patient declined    Attends Religious Services: More than 4 times per year    Active Member of Clubs or Organizations: Patient declined    Attends Banker Meetings: Patient declined    Marital Status: Patient  declined  Intimate Partner Violence: Not At Risk (09/24/2022)   Humiliation, Afraid, Rape, and Kick questionnaire    Fear of Current or Ex-Partner: No    Emotionally Abused: No    Physically Abused: No    Sexually Abused: No     Allergies  Allergen Reactions   Hydralazine Hives and Shortness Of Breath    Chest tightness, severe headache, dyspnea Other reaction(s): Chest Pain, Headache, Respiratory Distress   Gluten Meal Diarrhea    bloating   Moxifloxacin Hives    Avelox   Pseudoephedrine Other (See Comments)    Confusion, passed out, numbness   Sulfa Antibiotics Hives   Sulfonamide Derivatives Hives     CBC    Component Value Date/Time   WBC 5.8 02/26/2022 1003   WBC 12.3 (H) 02/01/2022 0944   RBC 4.01 (L) 02/26/2022 1003   RBC 4.05 (L) 02/01/2022 0944   HGB 12.1 (L) 02/26/2022 1003   HCT 41.4 09/19/2022 1009   PLT 237 02/26/2022 1003   MCV 93 02/26/2022 1003   MCH 30.2 02/26/2022 1003   MCH 31.4 02/01/2022 0944   MCHC 32.4 02/26/2022 1003   MCHC 33.7 02/01/2022 0944   RDW 13.0 02/26/2022 1003   LYMPHSABS 0.6 (L) 02/01/2022 0944   LYMPHSABS 2.2 11/20/2021 0850   MONOABS 0.7 02/01/2022 0944   EOSABS 0.0 02/01/2022 0944   EOSABS 0.1 11/20/2021 0850   BASOSABS 0.1 02/01/2022 0944   BASOSABS 0.1 11/20/2021 0850    Pulmonary Functions Testing Results:    Latest Ref Rng & Units 04/03/2022   11:30 AM  PFT Results  FVC-Pre L 5.13   FVC-Predicted Pre % 104   FVC-Post L 5.27  FVC-Predicted Post % 107   Pre FEV1/FVC % % 90   Post FEV1/FCV % % 90   FEV1-Pre L 4.61   FEV1-Predicted Pre % 126   FEV1-Post L 4.75   DLCO uncorrected ml/min/mmHg 35.99   DLCO UNC% % 127   DLVA Predicted % 107   TLC L 6.80   TLC % Predicted % 91   RV % Predicted % 78     Outpatient Medications Prior to Visit  Medication Sig Dispense Refill   albuterol (VENTOLIN HFA) 108 (90 Base) MCG/ACT inhaler Inhale 1-2 puffs into the lungs every 6 (six) hours as needed for wheezing or  shortness of breath. 18 g 3   Calcium Carb-Cholecalciferol (CALCIUM 600+D) 600-800 MG-UNIT TABS Take 1 tablet by mouth daily.     dexlansoprazole (DEXILANT) 60 MG capsule Take 1 capsule (60 mg total) by mouth daily. 90 capsule 1   doxycycline (VIBRAMYCIN) 100 MG capsule Take 1 capsule (100 mg total) by mouth 2 (two) times daily for 7 days. 14 capsule 0   dutasteride (AVODART) 0.5 MG capsule TAKE ONE CAPSULE BY MOUTH ONE TIME DAILY 90 capsule 3   famotidine (PEPCID) 40 MG tablet TAKE ONE TABLET BY MOUTH DAILY (Patient taking differently: Take 40 mg by mouth at bedtime.) 30 tablet 11   fluticasone-salmeterol (ADVAIR) 500-50 MCG/ACT AEPB Inhale 1 puff into the lungs in the morning and at bedtime. 60 each 6   ketotifen (ZADITOR) 0.025 % ophthalmic solution Place 1 drop into both eyes daily.      losartan (COZAAR) 100 MG tablet Take 1 tablet (100 mg total) by mouth daily. 90 tablet 1   Misc Natural Products (GLUCOSAMINE CHONDROITIN TRIPLE) TABS Take 1 tablet by mouth 2 (two) times daily.     Multiple Vitamin (MULTI-VITAMINS) TABS Take 1 tablet by mouth daily.      Multiple Vitamins-Minerals (PRESERVISION AREDS 2) CAPS Take 1 capsule by mouth 2 (two) times daily.     predniSONE (DELTASONE) 10 MG tablet Take 4 tablets (40 mg total) by mouth daily for 5 days. 20 tablet 0   tadalafil (CIALIS) 5 MG tablet TAKE ONE TABLET BY MOUTH AT BEDTIME 90 tablet 3   tamsulosin (FLOMAX) 0.4 MG CAPS capsule TAKE ONE CAPSULE BY MOUTH ONE TIME DAILY 90 capsule 3   Testosterone 1.62 % GEL APPLY ONE PUMP TOPICALLY TO EACH SHOUDER DAILY 75 g 2   Turmeric 500 MG CAPS Take 500 mg by mouth 2 (two) times daily.      verapamil (CALAN-SR) 240 MG CR tablet Take 1 tablet (240 mg total) by mouth 2 (two) times daily. 180 tablet 1   fluticasone (FLONASE) 50 MCG/ACT nasal spray Place 1 spray into both nostrils daily. 18.2 mL 11   No facility-administered medications prior to visit.

## 2022-12-04 ENCOUNTER — Other Ambulatory Visit (HOSPITAL_COMMUNITY): Payer: Self-pay

## 2022-12-04 NOTE — Telephone Encounter (Signed)
Returned call to [employer]. They are stating they need an enrollment form because patient needs to be screened for fast start program and so that rx can be triaged. Advised rep that patient is not eligible for this because he has Medicare and unclear where this request is now coming from after 2 months of back and forth with no indication that this form was needed for PAP.  Rep then transferred me to [facility] with PAP. She states they now need PHARMACY TEST CLAIM instead of medical ad advised a month ago. Spoke with our [facility], she will monitor case - states [employer] PAP changed their rules multiple times over the last couple of months which is likely how pt's case ended up in this situation  Phone: [phone] Fax: [phone] Case # [id number]  Chesley Mires, PharmD, MPH, BCPS, CPP Clinical Pharmacist (Rheumatology and Pulmonology)

## 2022-12-04 NOTE — Telephone Encounter (Signed)
Returned call to [employer]. They are stating they need an enrollment form because patient needs to be screened for fast start program and so that rx can be triaged. Advised rep that patient is not eligible for this because he has Medicare and unclear where this request is now coming from after 2 months of back and forth with no indication that this form was needed for PAP.  Rep then transferred me to [facility] with PAP. She states they now need PHARMACY TEST CLAIM instead of medical ad advised a month ago. Spoke with our drug rep, Shawna Orleans, she will monitor case - states [employer] PAP changed their rules multiple times over the last couple of months which is likely how pt's case ended up in this situation  She states that they have to now RERUN benefits for pt since case was closed. If medical benefits show 20% coinsruance w no auth required, pt will likely be approved. Otherwise they have test claim showing high copay through pharmacy benefit. Shawna Orleans states that rep at [employer] quoted her 24 hours  Phone: [phone] Fax: [phone] Case # [id number]  Chesley Mires, PharmD, MPH, BCPS, CPP Clinical Pharmacist (Rheumatology and Pulmonology)

## 2022-12-05 NOTE — Telephone Encounter (Signed)
Received email from [employer] drug rep that she has escalated patient's case to the highest level after discussion with Director of Access for [place].

## 2022-12-09 ENCOUNTER — Encounter: Payer: Self-pay | Admitting: Student in an Organized Health Care Education/Training Program

## 2022-12-09 ENCOUNTER — Ambulatory Visit (INDEPENDENT_AMBULATORY_CARE_PROVIDER_SITE_OTHER): Payer: HMO | Admitting: Student in an Organized Health Care Education/Training Program

## 2022-12-09 VITALS — BP 140/62 | HR 77 | Temp 98.2°F | Ht 66.0 in | Wt 200.0 lb

## 2022-12-09 DIAGNOSIS — J4550 Severe persistent asthma, uncomplicated: Secondary | ICD-10-CM

## 2022-12-09 DIAGNOSIS — J479 Bronchiectasis, uncomplicated: Secondary | ICD-10-CM

## 2022-12-09 MED ORDER — CEFPODOXIME PROXETIL 200 MG PO TABS
200.0000 mg | ORAL_TABLET | Freq: Two times a day (BID) | ORAL | 0 refills | Status: AC
Start: 2022-12-09 — End: 2022-12-14

## 2022-12-09 MED ORDER — ALBUTEROL SULFATE (2.5 MG/3ML) 0.083% IN NEBU: 2.5000 mg | INHALATION_SOLUTION | Freq: Two times a day (BID) | RESPIRATORY_TRACT | 12 refills | Status: DC

## 2022-12-09 MED ORDER — SODIUM CHLORIDE 3 % IN NEBU
INHALATION_SOLUTION | Freq: Two times a day (BID) | RESPIRATORY_TRACT | 12 refills | Status: DC
Start: 2022-12-09 — End: 2023-08-20

## 2022-12-09 NOTE — Telephone Encounter (Signed)
Per Shawna Orleans, patient's case has been officially transferred to [employer] after performing BIV. She will have Tezspire FRM keep eye on case  Chesley Mires, PharmD, MPH, BCPS, CPP Clinical Pharmacist (Rheumatology and Pulmonology)

## 2022-12-09 NOTE — Progress Notes (Unsigned)
Synopsis: acute visit for cough  Assessment & Plan:   1. Bronchiectasis without complication (HCC) 2. Severe persistent asthma, unspecified whether complicated  [name] [name] is presenting for follow up on severe persistent asthma with recurrent exacerbations. He had another exacerbation recently and finished up a course of steroids. His symptoms have persisted and he reports a cough that has been incessant.  Given that his physical exam does not show any wheeze, I am less suspicious of an asthma exacerbation. It is possible that the cough is post viral, but also on the differential is an infectious secondary to aspiration from his chronic reflux disease. I will treat this with a course of antibiotics (Cefpodoxime). I will also initiate a pulmonary clearance regimen to include hypertonic saline, albuterol, a flutter device, and incentive spirometry.   He overall has a T-helper cell type 1 profile, with no eosinophilia noted on previous blood work, negative allergen testing (negative IgE, no allergens detected), negative aspergillus testing, and PFT's with normal spirometry. FENO today was at 20 ppb. He unfortunately continues to have recurrent exacerbations, likely in the setting of reflux disease that is poorly controlled.   I had stepped up his therapy to high dose ICS (500-50), and during his previous visit we sent a referral for a biologic (tezepelumab) but this was not started secondary to issues with his insurance. We will re-attempt this in the new calendar year should his symptoms persist.   As to the infiltrates on the CT, and mild bronchiectasis noted on CT, this is directly related to his recurrent aspiration secondary to reflux. He is pending surgery. He did have induced sputa previously with cultures all returning negative (fungal cultures grew candida which we will ignore). We will initiate pulmonary clearance regimen today.   -continue Advair 500/50 -scheduled for nissen  fundoplication -re-attempt tezepelumab next year - cefpodoxime (VANTIN) 200 MG tablet; Take 1 tablet (200 mg total) by mouth 2 (two) times daily for 5 days.  Dispense: 10 tablet; Refill: 0 - sodium chloride HYPERTONIC 3 % nebulizer solution; Take by nebulization in the morning and at bedtime.  Dispense: 750 mL; Refill: 12 - albuterol (PROVENTIL) (2.5 MG/3ML) 0.083% nebulizer solution; Take 3 mLs (2.5 mg total) by nebulization in the morning and at bedtime.  Dispense: 75 mL; Refill: 12   Return in about 6 months (around 06/09/2023).  I spent 30 minutes caring for this patient today, including preparing to see the patient, obtaining a medical history , reviewing a separately obtained history, performing a medically appropriate examination and/or evaluation, counseling and educating the patient/family/caregiver, ordering medications, tests, or procedures, and documenting clinical information in the electronic health record  Raechel Chute, MD [facility] 12/09/2022 11:46 AM    End of visit medications:  Meds ordered this encounter  Medications   cefpodoxime (VANTIN) 200 MG tablet    Sig: Take 1 tablet (200 mg total) by mouth 2 (two) times daily for 5 days.    Dispense:  10 tablet    Refill:  0   sodium chloride HYPERTONIC 3 % nebulizer solution    Sig: Take by nebulization in the morning and at bedtime.    Dispense:  750 mL    Refill:  12   albuterol (PROVENTIL) (2.5 MG/3ML) 0.083% nebulizer solution    Sig: Take 3 mLs (2.5 mg total) by nebulization in the morning and at bedtime.    Dispense:  75 mL    Refill:  12     Current Outpatient  Medications:    albuterol (PROVENTIL) (2.5 MG/3ML) 0.083% nebulizer solution, Take 3 mLs (2.5 mg total) by nebulization in the morning and at bedtime., Disp: 75 mL, Rfl: 12   cefpodoxime (VANTIN) 200 MG tablet, Take 1 tablet (200 mg total) by mouth 2 (two) times daily for 5 days., Disp: 10 tablet, Rfl: 0   sodium chloride HYPERTONIC  3 % nebulizer solution, Take by nebulization in the morning and at bedtime., Disp: 750 mL, Rfl: 12   albuterol (VENTOLIN HFA) 108 (90 Base) MCG/ACT inhaler, Inhale 1-2 puffs into the lungs every 6 (six) hours as needed for wheezing or shortness of breath., Disp: 18 g, Rfl: 3   Calcium Carb-Cholecalciferol (CALCIUM 600+D) 600-800 MG-UNIT TABS, Take 1 tablet by mouth daily., Disp: , Rfl:    dexlansoprazole (DEXILANT) 60 MG capsule, Take 1 capsule (60 mg total) by mouth daily., Disp: 90 capsule, Rfl: 1   dutasteride (AVODART) 0.5 MG capsule, TAKE ONE CAPSULE BY MOUTH ONE TIME DAILY, Disp: 90 capsule, Rfl: 3   famotidine (PEPCID) 40 MG tablet, TAKE ONE TABLET BY MOUTH DAILY (Patient taking differently: Take 40 mg by mouth at bedtime.), Disp: 30 tablet, Rfl: 11   fluticasone (FLONASE) 50 MCG/ACT nasal spray, Place 1 spray into both nostrils daily., Disp: 18.2 mL, Rfl: 11   fluticasone-salmeterol (ADVAIR) 500-50 MCG/ACT AEPB, Inhale 1 puff into the lungs in the morning and at bedtime., Disp: 60 each, Rfl: 6   ketotifen (ZADITOR) 0.025 % ophthalmic solution, Place 1 drop into both eyes daily. , Disp: , Rfl:    losartan (COZAAR) 100 MG tablet, Take 1 tablet (100 mg total) by mouth daily., Disp: 90 tablet, Rfl: 1   Misc Natural Products (GLUCOSAMINE CHONDROITIN TRIPLE) TABS, Take 1 tablet by mouth 2 (two) times daily., Disp: , Rfl:    Multiple Vitamin (MULTI-VITAMINS) TABS, Take 1 tablet by mouth daily. , Disp: , Rfl:    Multiple Vitamins-Minerals (PRESERVISION AREDS 2) CAPS, Take 1 capsule by mouth 2 (two) times daily., Disp: , Rfl:    tadalafil (CIALIS) 5 MG tablet, TAKE ONE TABLET BY MOUTH AT BEDTIME, Disp: 90 tablet, Rfl: 3   tamsulosin (FLOMAX) 0.4 MG CAPS capsule, TAKE ONE CAPSULE BY MOUTH ONE TIME DAILY, Disp: 90 capsule, Rfl: 3   Testosterone 1.62 % GEL, APPLY ONE PUMP TOPICALLY TO EACH SHOUDER DAILY, Disp: 75 g, Rfl: 2   Turmeric 500 MG CAPS, Take 500 mg by mouth 2 (two) times daily. , Disp: , Rfl:     verapamil (CALAN-SR) 240 MG CR tablet, Take 1 tablet (240 mg total) by mouth 2 (two) times daily., Disp: 180 tablet, Rfl: 1   Subjective:   PATIENT ID: [name] [name] GENDER: male DOB: 1956-01-11, MRN: [id number]  Chief Complaint  Patient presents with   Acute Visit    Prod cough with white sputum, difficult to take a deep breath, runny nose and wheezing. Bronchitis 2 weeks ago tx with abx and prednisone. Coughing has not improved.     HPI  [name] [name] is a pleasant 67 year old male presenting to clinic for an acute visit. I last saw [name] [name] on 12/03/2022.  Since our last visit, he reports that his cough accelerated and has failed to resolve. He has been coughing more over the past few days and is concerned this would lead to cancellation of his planned surgery. He hasn't not had any increase in his shortness of breath.  During our last visit, he reported an uneventful summer without exacerbation.  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 He had a cold recently resulting in an asthma exacerbation. He remains on Advair 500-50. Surgery is scheduled for 12/18/2022.   He was initially seen by me in September of 2023 for symptoms of cough productive of whitish (sometimes yellow) sputum, associated with shortness of breath, exertional dyspnea, and wheezing. He was diagnosed with asthma many years ago by Dr. Bartlett Callas. He has had multiple exacerbations of his asthma, requiring antibiotics and steroids.   He does not have any pets and is a non-smoker (thou reports previous second hand exposure). He does not have any chest pain or tightness, nor does he have any fevers, chills, night sweats, or weight loss. He is compliant with his medications and is using Advair twice daily.   He tells me he has a history of asthma as well as cough, and had previously been seen at a [facility]. Work up at [facility] was initiated by  Renee Harder, MD. At that time, he was referred for low immunoglobulin levels. A high resolution CT chest in 2020 at [facility] suggested possible early bronchiectasis. He was referred to ENT and GI for further workup of sinusitis and reflux disease. Given concern for silent aspiration triggering his asthma, further workup was pursued. He was eventually referred to thoracic surgery after esophageal manometry showed a medium to large sized hiatal hernia. Barium swallow in January of 2023 didn't show a hiatal hernia but did show severe GERD with signs of aspiration. He was offered robot assisted laparoscopic repair of his paraesophageal hernia and fundoplication.   He is a never smoker, but reports second hand exposure. He used to work at [employer] and retired recently. He continues to be active in his church. He reports significant mold exposure at [facility], and suspects there might be mold exposure at church. He does not have any pets.   Ancillary information including prior medications, full medical/surgical/family/social histories, and PFTs (when available) are listed below and have been reviewed.   Review of Systems  Constitutional:  Negative for chills and fever.  Respiratory:  Positive for cough. Negative for sputum production, shortness of breath and wheezing.   Cardiovascular:  Negative for chest pain.  Skin:  Negative for rash.  All other systems reviewed and are negative.    Objective:   Vitals:   12/09/22 1134  BP: (!) 140/62  Pulse: 77  Temp: 98.2 F (36.8 C)  TempSrc: Oral  SpO2: 96%  Weight: 200 lb (90.7 kg)  Height: 5\' 6"  (1.676 m)   96% on RA  BMI Readings from Last 3 Encounters:  12/09/22 32.28 kg/m  12/03/22 26.91 kg/m  10/15/22 27.31 kg/m   Wt Readings from Last 3 Encounters:  12/09/22 200 lb (90.7 kg)  12/03/22 198 lb 6.4 oz (90 kg)  10/15/22 201 lb 6.4 oz (91.4 kg)    Physical Exam Constitutional:      Appearance: Normal appearance. He is normal  weight.  HENT:     Head: Normocephalic.     Mouth/Throat:     Mouth: Mucous membranes are moist.  Cardiovascular:     Rate and Rhythm: Normal rate and regular rhythm.     Pulses: Normal pulses.     Heart sounds: Normal heart sounds.  Pulmonary:     Effort: Pulmonary effort is normal.     Breath sounds: No wheezing, rhonchi or rales.  Musculoskeletal:  General: Normal range of motion.     Cervical back: Normal range of motion and neck supple.  Skin:    General: Skin is warm.  Neurological:     General: No focal deficit present.     Mental Status: He is alert and oriented to person, place, and time.       Ancillary Information    Past Medical History:  Diagnosis Date   Allergy    Arthritis    Knees & hands   Asthma    BP (high blood pressure) 11/07/2014   Should stop combination of Tekturna (DRI)  and ARB    Cataract 2018   Closed fracture of lateral malleolus of left fibula 07/23/2020   GERD (gastroesophageal reflux disease)    Hypertension    HYPERTENSION, SEVERE 03/15/2007   Qualifier: Diagnosis of  By: Vernie Murders     Prostate enlargement    Sleep apnea    Status post total knee replacement using cement, left 04/01/2019   Stress fracture of tibia and fibula 07/18/2020     Family History  Problem Relation Age of Onset   Cancer Mother        ovarian   Glaucoma Mother    Cancer Father        prostate   Hypertension Sister    Asthma Sister    Alcohol abuse Paternal Uncle    Liver disease Paternal Uncle    Cancer - Prostate Paternal Uncle    Alcohol abuse Paternal Uncle    Cancer Maternal Uncle        lung; two mat uncles   Cancer Paternal Grandmother        breast   Cancer Paternal Aunt        lung cancer in a smoker   Arthritis Maternal Grandfather    Arthritis Maternal Grandmother    ADD / ADHD Son    Anxiety disorder Son    Cancer Maternal Aunt    Cancer Maternal Uncle      Past Surgical History:  Procedure Laterality Date    APPENDECTOMY  1990   Dr Lemar Livings   COLONOSCOPY  2008   Dr Lemar Livings   COLONOSCOPY WITH PROPOFOL N/A 08/13/2016   Procedure: COLONOSCOPY WITH PROPOFOL;  Surgeon: Earline Mayotte, MD;  Location[facility] ENDOSCOPY;  Service: Endoscopy;  Laterality: N/A;   FRACTURE SURGERY  April 2023   Left Ankle   HEMORRHOID SURGERY     HERNIA REPAIR  01/20/2014   umbilical hernia   JOINT REPLACEMENT  Feb 2021   Left knee replacement   KNEE ARTHROSCOPY Left    KNEE SURGERY Left 1986   NASAL SEPTUM SURGERY     TONSILLECTOMY     TOTAL KNEE ARTHROPLASTY Left 03/29/2019   Procedure: TOTAL KNEE ARTHROPLASTY;  Surgeon: Christena Flake, MD;  Location: [facility] ORS;  Service: Orthopedics;  Laterality: Left;   VASECTOMY      Social History   Socioeconomic History   Marital status: Married    Spouse name: Not on file   Number of children: Not on file   Years of education: Not on file   Highest education level: Master's degree (e.g., MA, MS, MEng, MEd, MSW, MBA)  Occupational History   Not on file  Tobacco Use   Smoking status: Never   Smokeless tobacco: Never   Tobacco comments:    2nd hand smoke growing up  Substance and Sexual Activity   Alcohol use: Not Currently    Comment: occasionally  Drug use: Never   Sexual activity: Yes    Birth control/protection: Surgical  Other Topics Concern   Not on file  Social History Narrative   Not on file   Social Determinants of Health   Financial Resource Strain: Patient Declined (09/22/2022)   Overall Financial Resource Strain (CARDIA)    Difficulty of Paying Living Expenses: Patient declined  Food Insecurity: No Food Insecurity (09/22/2022)   Hunger Vital Sign    Worried About Running Out of Food in the Last Year: Never true    Ran Out of Food in the Last Year: Never true  Transportation Needs: No Transportation Needs (09/22/2022)   PRAPARE - Administrator, Civil Service (Medical): No    Lack of Transportation (Non-Medical): No  Physical  Activity: Patient Declined (09/22/2022)   Exercise Vital Sign    Days of Exercise per Week: Patient declined    Minutes of Exercise per Session: Patient declined  Stress: Patient Declined (09/22/2022)   [employer] of Occupational Health - Occupational Stress Questionnaire    Feeling of Stress : Patient declined  Social Connections: Unknown (09/22/2022)   Social Connection and Isolation Panel [NHANES]    Frequency of Communication with Friends and Family: Patient declined    Frequency of Social Gatherings with Friends and Family: Patient declined    Attends Religious Services: More than 4 times per year    Active Member of Clubs or Organizations: Patient declined    Attends Banker Meetings: Patient declined    Marital Status: Patient declined  Intimate Partner Violence: Not At Risk (09/24/2022)   Humiliation, Afraid, Rape, and Kick questionnaire    Fear of Current or Ex-Partner: No    Emotionally Abused: No    Physically Abused: No    Sexually Abused: No     Allergies  Allergen Reactions   Hydralazine Hives and Shortness Of Breath    Chest tightness, severe headache, dyspnea Other reaction(s): Chest Pain, Headache, Respiratory Distress   Gluten Meal Diarrhea    bloating   Moxifloxacin Hives    Avelox   Pseudoephedrine Other (See Comments)    Confusion, passed out, numbness   Sulfa Antibiotics Hives   Sulfonamide Derivatives Hives     CBC    Component Value Date/Time   WBC 5.8 02/26/2022 1003   WBC 12.3 (H) 02/01/2022 0944   RBC 4.01 (L) 02/26/2022 1003   RBC 4.05 (L) 02/01/2022 0944   HGB 12.1 (L) 02/26/2022 1003   HCT 41.4 09/19/2022 1009   PLT 237 02/26/2022 1003   MCV 93 02/26/2022 1003   MCH 30.2 02/26/2022 1003   MCH 31.4 02/01/2022 0944   MCHC 32.4 02/26/2022 1003   MCHC 33.7 02/01/2022 0944   RDW 13.0 02/26/2022 1003   LYMPHSABS 0.6 (L) 02/01/2022 0944   LYMPHSABS 2.2 11/20/2021 0850   MONOABS 0.7 02/01/2022 0944   EOSABS 0.0 02/01/2022  0944   EOSABS 0.1 11/20/2021 0850   BASOSABS 0.1 02/01/2022 0944   BASOSABS 0.1 11/20/2021 0850    Pulmonary Functions Testing Results:    Latest Ref Rng & Units 04/03/2022   11:30 AM  PFT Results  FVC-Pre L 5.13   FVC-Predicted Pre % 104   FVC-Post L 5.27   FVC-Predicted Post % 107   Pre FEV1/FVC % % 90   Post FEV1/FCV % % 90   FEV1-Pre L 4.61   FEV1-Predicted Pre % 126   FEV1-Post L 4.75   DLCO uncorrected ml/min/mmHg 35.99   DLCO  [facility]% % 127   DLVA Predicted % 107   TLC L 6.80   TLC % Predicted % 91   RV % Predicted % 78     Outpatient Medications Prior to Visit  Medication Sig Dispense Refill   albuterol (VENTOLIN HFA) 108 (90 Base) MCG/ACT inhaler Inhale 1-2 puffs into the lungs every 6 (six) hours as needed for wheezing or shortness of breath. 18 g 3   Calcium Carb-Cholecalciferol (CALCIUM 600+D) 600-800 MG-UNIT TABS Take 1 tablet by mouth daily.     dexlansoprazole (DEXILANT) 60 MG capsule Take 1 capsule (60 mg total) by mouth daily. 90 capsule 1   dutasteride (AVODART) 0.5 MG capsule TAKE ONE CAPSULE BY MOUTH ONE TIME DAILY 90 capsule 3   famotidine (PEPCID) 40 MG tablet TAKE ONE TABLET BY MOUTH DAILY (Patient taking differently: Take 40 mg by mouth at bedtime.) 30 tablet 11   fluticasone (FLONASE) 50 MCG/ACT nasal spray Place 1 spray into both nostrils daily. 18.2 mL 11   fluticasone-salmeterol (ADVAIR) 500-50 MCG/ACT AEPB Inhale 1 puff into the lungs in the morning and at bedtime. 60 each 6   ketotifen (ZADITOR) 0.025 % ophthalmic solution Place 1 drop into both eyes daily.      losartan (COZAAR) 100 MG tablet Take 1 tablet (100 mg total) by mouth daily. 90 tablet 1   Misc Natural Products (GLUCOSAMINE CHONDROITIN TRIPLE) TABS Take 1 tablet by mouth 2 (two) times daily.     Multiple Vitamin (MULTI-VITAMINS) TABS Take 1 tablet by mouth daily.      Multiple Vitamins-Minerals (PRESERVISION AREDS 2) CAPS Take 1 capsule by mouth 2 (two) times daily.     tadalafil (CIALIS)  5 MG tablet TAKE ONE TABLET BY MOUTH AT BEDTIME 90 tablet 3   tamsulosin (FLOMAX) 0.4 MG CAPS capsule TAKE ONE CAPSULE BY MOUTH ONE TIME DAILY 90 capsule 3   Testosterone 1.62 % GEL APPLY ONE PUMP TOPICALLY TO EACH SHOUDER DAILY 75 g 2   Turmeric 500 MG CAPS Take 500 mg by mouth 2 (two) times daily.      verapamil (CALAN-SR) 240 MG CR tablet Take 1 tablet (240 mg total) by mouth 2 (two) times daily. 180 tablet 1   No facility-administered medications prior to visit.

## 2022-12-09 NOTE — Patient Instructions (Signed)
We will start a pulmonary clearance regimen that will include the following:  -Start with using the hypertonic saline by nebulizer -Then follow the albuterol by nebulizer -Then use the flutter device: please do at least 10-15 blows into the device for at least 2 to 3 seconds each -And finish up with using the incentive spirometer; use for at least 10 times -After doing these, attempt to cough.  Please do these twice a day

## 2022-12-11 DIAGNOSIS — Z01818 Encounter for other preprocedural examination: Secondary | ICD-10-CM | POA: Diagnosis not present

## 2022-12-11 DIAGNOSIS — G4733 Obstructive sleep apnea (adult) (pediatric): Secondary | ICD-10-CM | POA: Diagnosis not present

## 2022-12-11 DIAGNOSIS — K449 Diaphragmatic hernia without obstruction or gangrene: Secondary | ICD-10-CM | POA: Diagnosis not present

## 2022-12-11 DIAGNOSIS — J479 Bronchiectasis, uncomplicated: Secondary | ICD-10-CM | POA: Diagnosis not present

## 2022-12-11 DIAGNOSIS — I10 Essential (primary) hypertension: Secondary | ICD-10-CM | POA: Diagnosis not present

## 2022-12-11 DIAGNOSIS — J4550 Severe persistent asthma, uncomplicated: Secondary | ICD-10-CM | POA: Diagnosis not present

## 2022-12-11 NOTE — Telephone Encounter (Signed)
Spoke with patient - he has surgery scheduled for 12/18/2022. He'd like to wait to start Tezspire. He requested call back a few weeks after his surgery

## 2022-12-11 NOTE — Telephone Encounter (Signed)
Received a fax from  [employer] regarding an approval for TEZSPIRE patient assistance from 12/10/2022 to 03/10/2023. Approval letter sent to scan center.  Phone: [phone]  Patient can be scheduled for Tezspire new start  Chesley Mires, PharmD, MPH, BCPS, CPP Clinical Pharmacist (Rheumatology and Pulmonology)

## 2022-12-17 ENCOUNTER — Other Ambulatory Visit: Payer: Self-pay | Admitting: Family Medicine

## 2022-12-17 DIAGNOSIS — K219 Gastro-esophageal reflux disease without esophagitis: Secondary | ICD-10-CM

## 2022-12-17 NOTE — Telephone Encounter (Signed)
Medication Refill - Medication: dexlansoprazole (DEXILANT) 60 MG capsule   Has the patient contacted their pharmacy? Yes.     Preferred Pharmacy (with phone number or street name):  [facility] [address] [facility] - [place], [place] - [address] AT [address] Phone: [phone]  Fax: [phone]      Has the patient been seen for an appointment in the last year OR does the patient have an upcoming appointment? Yes.    The patient took his last pill today and contacted his pharmacy days ago who told him they sent the request to his provider but had not heard back. I do not see a recent refill request that was sent from the  pharmacy. Please assist patient further.

## 2022-12-18 DIAGNOSIS — J4551 Severe persistent asthma with (acute) exacerbation: Secondary | ICD-10-CM | POA: Diagnosis not present

## 2022-12-18 DIAGNOSIS — I10 Essential (primary) hypertension: Secondary | ICD-10-CM | POA: Diagnosis not present

## 2022-12-18 DIAGNOSIS — R918 Other nonspecific abnormal finding of lung field: Secondary | ICD-10-CM | POA: Diagnosis not present

## 2022-12-18 DIAGNOSIS — J479 Bronchiectasis, uncomplicated: Secondary | ICD-10-CM | POA: Diagnosis not present

## 2022-12-18 DIAGNOSIS — Z8616 Personal history of COVID-19: Secondary | ICD-10-CM | POA: Diagnosis not present

## 2022-12-18 DIAGNOSIS — G4733 Obstructive sleep apnea (adult) (pediatric): Secondary | ICD-10-CM | POA: Diagnosis not present

## 2022-12-18 DIAGNOSIS — Z4682 Encounter for fitting and adjustment of non-vascular catheter: Secondary | ICD-10-CM | POA: Diagnosis not present

## 2022-12-18 DIAGNOSIS — Z86718 Personal history of other venous thrombosis and embolism: Secondary | ICD-10-CM | POA: Diagnosis not present

## 2022-12-18 DIAGNOSIS — Z79899 Other long term (current) drug therapy: Secondary | ICD-10-CM | POA: Diagnosis not present

## 2022-12-18 DIAGNOSIS — K219 Gastro-esophageal reflux disease without esophagitis: Secondary | ICD-10-CM | POA: Diagnosis not present

## 2022-12-18 DIAGNOSIS — K449 Diaphragmatic hernia without obstruction or gangrene: Secondary | ICD-10-CM | POA: Diagnosis not present

## 2022-12-18 MED ORDER — DEXLANSOPRAZOLE 60 MG PO CPDR: 1.0000 | DELAYED_RELEASE_CAPSULE | Freq: Every day | ORAL | 0 refills | Status: DC

## 2022-12-18 NOTE — Telephone Encounter (Signed)
Requested Prescriptions  Pending Prescriptions Disp Refills   dexlansoprazole (DEXILANT) 60 MG capsule 90 capsule 0    Sig: Take 1 capsule (60 mg total) by mouth daily.     Gastroenterology: Proton Pump Inhibitors Passed - 12/17/2022  4:42 PM      Passed - Valid encounter within last 12 months    Recent Outpatient Visits           2 months ago Essential hypertension   [place] [facility] Simmons-Robinson, [place], MD   3 months ago Essential hypertension   [place] [facility] [place], [place], MD   5 months ago Moderate persistent asthma with exacerbation   [facility] Jacky Kindle, FNP   6 months ago Essential hypertension   [facility] Simmons-Robinson, [employer], MD   6 months ago Refractory chronic cough   [facility] Simmons-Robinson, Tawanna Cooler, MD       Future Appointments             In 1 month Simmons-Robinson, Tawanna Cooler, MD [facility]   In 9 months Stoioff, Verna Czech, MD [facility] [place]

## 2022-12-22 ENCOUNTER — Encounter: Payer: Self-pay | Admitting: Student in an Organized Health Care Education/Training Program

## 2022-12-23 NOTE — Telephone Encounter (Signed)
Routing to Dr. Aundria Rud as an Lorain Childes

## 2022-12-29 ENCOUNTER — Telehealth: Payer: Self-pay | Admitting: Family Medicine

## 2022-12-29 DIAGNOSIS — I10 Essential (primary) hypertension: Secondary | ICD-10-CM

## 2022-12-29 MED ORDER — LOSARTAN POTASSIUM 100 MG PO TABS: 100.0000 mg | ORAL_TABLET | Freq: Every day | ORAL | 1 refills | Status: DC

## 2022-12-29 NOTE — Telephone Encounter (Signed)
[facility] faxed refill request for the following medications:   losartan (COZAAR) 100 MG tablet    Please advise.

## 2022-12-29 NOTE — Telephone Encounter (Signed)
refilled 

## 2023-01-01 DIAGNOSIS — D225 Melanocytic nevi of trunk: Secondary | ICD-10-CM | POA: Diagnosis not present

## 2023-01-01 DIAGNOSIS — Z85828 Personal history of other malignant neoplasm of skin: Secondary | ICD-10-CM | POA: Diagnosis not present

## 2023-01-01 DIAGNOSIS — X32XXXA Exposure to sunlight, initial encounter: Secondary | ICD-10-CM | POA: Diagnosis not present

## 2023-01-01 DIAGNOSIS — D2262 Melanocytic nevi of left upper limb, including shoulder: Secondary | ICD-10-CM | POA: Diagnosis not present

## 2023-01-01 DIAGNOSIS — L570 Actinic keratosis: Secondary | ICD-10-CM | POA: Diagnosis not present

## 2023-01-01 DIAGNOSIS — R208 Other disturbances of skin sensation: Secondary | ICD-10-CM | POA: Diagnosis not present

## 2023-01-01 DIAGNOSIS — D2271 Melanocytic nevi of right lower limb, including hip: Secondary | ICD-10-CM | POA: Diagnosis not present

## 2023-01-01 DIAGNOSIS — B078 Other viral warts: Secondary | ICD-10-CM | POA: Diagnosis not present

## 2023-01-01 DIAGNOSIS — L538 Other specified erythematous conditions: Secondary | ICD-10-CM | POA: Diagnosis not present

## 2023-01-01 DIAGNOSIS — D2261 Melanocytic nevi of right upper limb, including shoulder: Secondary | ICD-10-CM | POA: Diagnosis not present

## 2023-01-16 NOTE — Progress Notes (Unsigned)
Annual Wellness Visit     Patient: [name] [name], Male    DOB: Jun 27, 1955, 67 y.o.   MRN: [id number] Visit Date: 01/19/2023  Today's Provider: [name], MD   No chief complaint on file.  Subjective    [name] [name] is a 67 y.o. male who presents today for his Annual Wellness Visit.  He reports consuming a {diet types:17450} diet.  {Exercise:19826} He generally feels {well/fairly well/poorly:18703}.  He reports sleeping {well/fairly well/poorly:18703}.    He {does/does not:200015} have additional problems to discuss today.    Medications: Outpatient Medications Prior to Visit  Medication Sig   albuterol (PROVENTIL) (2.5 MG/3ML) 0.083% nebulizer solution Take 3 mLs (2.5 mg total) by nebulization in the morning and at bedtime.   albuterol (VENTOLIN HFA) 108 (90 Base) MCG/ACT inhaler Inhale 1-2 puffs into the lungs every 6 (six) hours as needed for wheezing or shortness of breath.   Calcium Carb-Cholecalciferol (CALCIUM 600+D) 600-800 MG-UNIT TABS Take 1 tablet by mouth daily.   dexlansoprazole (DEXILANT) 60 MG capsule Take 1 capsule (60 mg total) by mouth daily.   dutasteride (AVODART) 0.5 MG capsule TAKE ONE CAPSULE BY MOUTH ONE TIME DAILY   famotidine (PEPCID) 40 MG tablet TAKE ONE TABLET BY MOUTH DAILY (Patient taking differently: Take 40 mg by mouth at bedtime.)   fluticasone (FLONASE) 50 MCG/ACT nasal spray Place 1 spray into both nostrils daily.   fluticasone-salmeterol (ADVAIR) 500-50 MCG/ACT AEPB Inhale 1 puff into the lungs in the morning and at bedtime.   ketotifen (ZADITOR) 0.025 % ophthalmic solution Place 1 drop into both eyes daily.    losartan (COZAAR) 100 MG tablet Take 1 tablet (100 mg total) by mouth daily.   Misc Natural Products (GLUCOSAMINE CHONDROITIN TRIPLE) TABS Take 1 tablet by mouth 2 (two) times daily.   Multiple Vitamin (MULTI-VITAMINS) TABS Take 1 tablet by mouth daily.    Multiple Vitamins-Minerals (PRESERVISION  AREDS 2) CAPS Take 1 capsule by mouth 2 (two) times daily.   sodium chloride HYPERTONIC 3 % nebulizer solution Take by nebulization in the morning and at bedtime.   tadalafil (CIALIS) 5 MG tablet TAKE ONE TABLET BY MOUTH AT BEDTIME   tamsulosin (FLOMAX) 0.4 MG CAPS capsule TAKE ONE CAPSULE BY MOUTH ONE TIME DAILY   Testosterone 1.62 % GEL APPLY ONE PUMP TOPICALLY TO EACH SHOUDER DAILY   Turmeric 500 MG CAPS Take 500 mg by mouth 2 (two) times daily.    verapamil (CALAN-SR) 240 MG CR tablet Take 1 tablet (240 mg total) by mouth 2 (two) times daily.   No facility-administered medications prior to visit.    Allergies  Allergen Reactions   Hydralazine Hives and Shortness Of Breath    Chest tightness, severe headache, dyspnea Other reaction(s): Chest Pain, Headache, Respiratory Distress   Gluten Meal Diarrhea    bloating   Moxifloxacin Hives    Avelox   Pseudoephedrine Other (See Comments)    Confusion, passed out, numbness   Sulfa Antibiotics Hives   Sulfonamide Derivatives Hives    Patient Care Team: [name], MD as PCP - General (Family Medicine) Bosie Clos, MD (Family Medicine) Lemar Livings, Merrily Pew, MD (General Surgery)  Review of Systems  {Insert previous labs (optional):23779} {See past labs  Heme  Chem  Endocrine  Serology  Results Review (optional):1}    Objective    Vitals: There were no vitals taken for this visit. {Insert last BP/Wt (optional):23777}{See vitals history (optional):1}    Physical Exam ***  Most recent functional status assessment:    09/22/2022    3:51 PM  In your present state of health, do you have any difficulty performing the following activities:  Hearing? 0  Vision? 0  Difficulty concentrating or making decisions? 0  Walking or climbing stairs? 0  Dressing or bathing? 0  Doing errands, shopping? 0  Preparing Food and eating ? N  Using the Toilet? N  In the past six months, have you accidently leaked urine?  N  Do you have problems with loss of bowel control? N  Managing your Medications? N  Managing your Finances? N  Housekeeping or managing your Housekeeping? N   Most recent fall risk assessment:    09/22/2022    3:51 PM  Fall Risk   Falls in the past year? 0  Injury with Fall? 0  Risk for fall due to : No Fall Risks  Follow up Education provided;Falls prevention discussed    Most recent depression screenings:    09/24/2022    2:55 PM 09/19/2022    8:35 AM  PHQ 2/9 Scores  PHQ - 2 Score 0 0   Most recent cognitive screening:    09/24/2022    2:58 PM  6CIT Screen  What Year? 0 points  What month? 0 points  What time? 0 points  Count back from 20 0 points  Months in reverse 0 points  Repeat phrase 0 points  Total Score 0 points   Most recent Audit-C alcohol use screening    09/22/2022    3:51 PM  Alcohol Use Disorder Test (AUDIT)  1. How often do you have a drink containing alcohol? 1  2. How many drinks containing alcohol do you have on a typical day when you are drinking? 0  3. How often do you have six or more drinks on one occasion? 0  AUDIT-C Score 1   A score of 3 or more in women, and 4 or more in men indicates increased risk for alcohol abuse, EXCEPT if all of the points are from question 1       11/29/2022   10:22 AM  Advanced Directives  Does Patient Have a Medical Advance Directive? No      No results found for any visits on 01/19/23.  Assessment & Plan     Problem List Items Addressed This Visit   None    Immunization History  Administered Date(s) Administered   H1N1 01/18/2008   Influenza Split 01/12/2010   Influenza,inj,Quad PF,6+ Mos 11/28/2014, 11/11/2018   Influenza-Unspecified 12/01/2016, 12/01/2017, 11/23/2019, 11/22/2020, 11/08/2021, 12/06/2021   MMR 01/20/2018   Moderna SARS-COV2 Booster Vaccination 09/25/2020   Moderna Sars-Covid-2 Vaccination 02/15/2021   PFIZER(Purple Top)SARS-COV-2 Vaccination 05/17/2019, 06/07/2019,  12/08/2019   PNEUMOCOCCAL CONJUGATE-20 03/20/2022   Pneumococcal Polysaccharide-23 09/04/1998   Respiratory Syncytial Virus Vaccine,Recomb Aduvanted(Arexvy) 12/06/2021   Td 11/15/2003   Tdap 10/11/2013   Unspecified SARS-COV-2 Vaccination 12/13/2021    Health Maintenance  Topic Date Due   Zoster Vaccines- Shingrix (1 of 2) Never done   COVID-19 Vaccine (6 - 2023-24 season) 11/09/2022   INFLUENZA VACCINE  06/08/2023 (Originally 10/09/2022)   Medicare Annual Wellness (AWV)  09/24/2023   DTaP/Tdap/Td (3 - Td or Tdap) 10/12/2023   Colonoscopy  08/14/2026   Pneumonia Vaccine 76+ Years old  Completed   Hepatitis C Screening  Completed   HPV VACCINES  Aged Out     No follow-ups on file.       [name], MD  [facility] [phone] (phone) [phone] (fax)  [facility]

## 2023-01-19 ENCOUNTER — Ambulatory Visit (INDEPENDENT_AMBULATORY_CARE_PROVIDER_SITE_OTHER): Payer: HMO | Admitting: Family Medicine

## 2023-01-19 ENCOUNTER — Encounter: Payer: Self-pay | Admitting: Family Medicine

## 2023-01-19 VITALS — BP 137/71 | HR 78 | Temp 98.3°F | Resp 16 | Ht 72.0 in | Wt 180.1 lb

## 2023-01-19 DIAGNOSIS — I10 Essential (primary) hypertension: Secondary | ICD-10-CM | POA: Diagnosis not present

## 2023-01-19 DIAGNOSIS — Z13220 Encounter for screening for lipoid disorders: Secondary | ICD-10-CM | POA: Diagnosis not present

## 2023-01-19 DIAGNOSIS — Z0000 Encounter for general adult medical examination without abnormal findings: Secondary | ICD-10-CM | POA: Insufficient documentation

## 2023-01-19 DIAGNOSIS — G4733 Obstructive sleep apnea (adult) (pediatric): Secondary | ICD-10-CM | POA: Diagnosis not present

## 2023-01-19 DIAGNOSIS — Z131 Encounter for screening for diabetes mellitus: Secondary | ICD-10-CM | POA: Diagnosis not present

## 2023-01-19 DIAGNOSIS — Z23 Encounter for immunization: Secondary | ICD-10-CM

## 2023-01-19 DIAGNOSIS — E559 Vitamin D deficiency, unspecified: Secondary | ICD-10-CM | POA: Diagnosis not present

## 2023-01-19 HISTORY — DX: Encounter for general adult medical examination without abnormal findings: Z00.00

## 2023-01-19 NOTE — Patient Instructions (Addendum)
It was a pleasure to see you today!  Thank you for choosing [facility] for your primary care.   Today you were seen for your annual physical  Please review the attached information regarding helpful preventive health topics.   To keep you healthy, please keep in mind the following health maintenance items that you are due for:   1.Shingrix 2. COVID   Please make sure to schedule your next annual physical for one year from today.   Best Wishes,   Dr. Roxan Hockey

## 2023-01-19 NOTE — Assessment & Plan Note (Addendum)
Chronic conditions are stable  Patient was counseled on benefits of regular physical activity with goal of 150 minutes of moderate to vigurous intensity 4 days per week (once cleared postoperatively) Patient was counseled to consume well balanced diet of fruits, vegetables, limited saturated fats and limited sugary foods and beverages with emphasis on consuming 6-8 glasses of water daily  Screening recommended today: A1c, lipids, CMP, vitamin D, CBC Vaccines recommended today: COVID,Shingrix; received influenza vaccine today

## 2023-01-19 NOTE — Assessment & Plan Note (Signed)
Annual wellness visit completed today including all of the following: -Reviewed patient's family medical history -Reviewed and updated patient's list of medical providers -Completed assessment of cognitive impairment -Completed assessment of patient's functional ability -Provided patient with recommendations for health screening services as well as vaccines Health risk assessment completed and reviewed -Discussed recommendations for well-balanced diet in addition to 150 minutes of physical activity per week  

## 2023-01-20 LAB — CMP14+EGFR
ALT: 13 [IU]/L (ref 0–44)
AST: 16 [IU]/L (ref 0–40)
Albumin: 4.2 g/dL (ref 3.9–4.9)
Alkaline Phosphatase: 69 [IU]/L (ref 44–121)
BUN/Creatinine Ratio: 9 — ABNORMAL LOW (ref 10–24)
BUN: 8 mg/dL (ref 8–27)
Bilirubin Total: 0.5 mg/dL (ref 0.0–1.2)
CO2: 27 mmol/L (ref 20–29)
Calcium: 8.9 mg/dL (ref 8.6–10.2)
Chloride: 104 mmol/L (ref 96–106)
Creatinine, Ser: 0.86 mg/dL (ref 0.76–1.27)
Globulin, Total: 1.7 g/dL (ref 1.5–4.5)
Glucose: 122 mg/dL — ABNORMAL HIGH (ref 70–99)
Potassium: 4 mmol/L (ref 3.5–5.2)
Sodium: 143 mmol/L (ref 134–144)
Total Protein: 5.9 g/dL — ABNORMAL LOW (ref 6.0–8.5)
eGFR: 95 mL/min/{1.73_m2} (ref >59)

## 2023-01-20 LAB — CBC
Hematocrit: 38.5 % (ref 37.5–51.0)
Hemoglobin: 12.7 g/dL — ABNORMAL LOW (ref 13.0–17.7)
MCH: 31.1 pg (ref 26.6–33.0)
MCHC: 33 g/dL (ref 31.5–35.7)
MCV: 94 fL (ref 79–97)
Platelets: 181 10*3/uL (ref 150–450)
RBC: 4.08 x10E6/uL — ABNORMAL LOW (ref 4.14–5.80)
RDW: 12.8 % (ref 11.6–15.4)
WBC: 4.8 10*3/uL (ref 3.4–10.8)

## 2023-01-20 LAB — LIPID PANEL
Chol/HDL Ratio: 3.3 ratio (ref 0.0–5.0)
Cholesterol, Total: 187 mg/dL (ref 100–199)
HDL: 57 mg/dL (ref >39)
LDL Chol Calc (NIH): 111 mg/dL — ABNORMAL HIGH (ref 0–99)
Triglycerides: 108 mg/dL (ref 0–149)
VLDL Cholesterol Cal: 19 mg/dL (ref 5–40)

## 2023-01-20 LAB — VITAMIN D 25 HYDROXY (VIT D DEFICIENCY, FRACTURES): Vit D, 25-Hydroxy: 78.6 ng/mL (ref 30.0–100.0)

## 2023-01-20 LAB — HEMOGLOBIN A1C
Est. average glucose Bld gHb Est-mCnc: 108 mg/dL
Hgb A1c MFr Bld: 5.4 % (ref 4.8–5.6)

## 2023-02-27 ENCOUNTER — Other Ambulatory Visit: Payer: Self-pay

## 2023-02-27 DIAGNOSIS — J4550 Severe persistent asthma, uncomplicated: Secondary | ICD-10-CM

## 2023-02-27 MED ORDER — FLUTICASONE-SALMETEROL 500-50 MCG/ACT IN AEPB: 1.0000 | INHALATION_SPRAY | Freq: Two times a day (BID) | RESPIRATORY_TRACT | 6 refills | Status: DC

## 2023-03-06 ENCOUNTER — Other Ambulatory Visit: Payer: Self-pay

## 2023-03-06 ENCOUNTER — Encounter: Payer: Self-pay | Admitting: Emergency Medicine

## 2023-03-06 ENCOUNTER — Ambulatory Visit: Payer: Self-pay

## 2023-03-06 ENCOUNTER — Emergency Department
Admission: EM | Admit: 2023-03-06 | Discharge: 2023-03-06 | Disposition: A | Discharge status: Home / Self Care | Payer: HMO | Attending: Emergency Medicine | Admitting: Emergency Medicine

## 2023-03-06 ENCOUNTER — Emergency Department: Payer: HMO

## 2023-03-06 DIAGNOSIS — K5900 Constipation, unspecified: Secondary | ICD-10-CM | POA: Diagnosis not present

## 2023-03-06 DIAGNOSIS — R1031 Right lower quadrant pain: Secondary | ICD-10-CM

## 2023-03-06 DIAGNOSIS — K4020 Bilateral inguinal hernia, without obstruction or gangrene, not specified as recurrent: Secondary | ICD-10-CM | POA: Diagnosis not present

## 2023-03-06 DIAGNOSIS — K449 Diaphragmatic hernia without obstruction or gangrene: Secondary | ICD-10-CM | POA: Diagnosis not present

## 2023-03-06 DIAGNOSIS — N200 Calculus of kidney: Secondary | ICD-10-CM | POA: Diagnosis not present

## 2023-03-06 LAB — CBC
HCT: 41.5 % (ref 39.0–52.0)
Hemoglobin: 14 g/dL (ref 13.0–17.0)
MCH: 31.7 pg (ref 26.0–34.0)
MCHC: 33.7 g/dL (ref 30.0–36.0)
MCV: 93.9 fL (ref 80.0–100.0)
Platelets: 155 10*3/uL (ref 150–400)
RBC: 4.42 MIL/uL (ref 4.22–5.81)
RDW: 14.2 % (ref 11.5–15.5)
WBC: 3.8 10*3/uL — ABNORMAL LOW (ref 4.0–10.5)
nRBC: 0 % (ref 0.0–0.2)

## 2023-03-06 LAB — COMPREHENSIVE METABOLIC PANEL
ALT: 15 U/L (ref 0–44)
AST: 20 U/L (ref 15–41)
Albumin: 3.9 g/dL (ref 3.5–5.0)
Alkaline Phosphatase: 59 U/L (ref 38–126)
Anion gap: 9 (ref 5–15)
BUN: 10 mg/dL (ref 8–23)
CO2: 27 mmol/L (ref 22–32)
Calcium: 8.4 mg/dL — ABNORMAL LOW (ref 8.9–10.3)
Chloride: 100 mmol/L (ref 98–111)
Creatinine, Ser: 0.76 mg/dL (ref 0.61–1.24)
GFR, Estimated: >60 mL/min (ref >60)
Glucose, Bld: 94 mg/dL (ref 70–99)
Potassium: 3.2 mmol/L — ABNORMAL LOW (ref 3.5–5.1)
Sodium: 136 mmol/L (ref 135–145)
Total Bilirubin: 0.8 mg/dL (ref <1.2)
Total Protein: 6.3 g/dL — ABNORMAL LOW (ref 6.5–8.1)

## 2023-03-06 LAB — URINALYSIS, ROUTINE W REFLEX MICROSCOPIC
Bilirubin Urine: NEGATIVE
Glucose, UA: NEGATIVE mg/dL
Hgb urine dipstick: NEGATIVE
Ketones, ur: NEGATIVE mg/dL
Leukocytes,Ua: NEGATIVE
Nitrite: NEGATIVE
Protein, ur: NEGATIVE mg/dL
Specific Gravity, Urine: 1.004 — ABNORMAL LOW (ref 1.005–1.030)
pH: 7 (ref 5.0–8.0)

## 2023-03-06 MED ORDER — IOHEXOL 300 MG/ML  SOLN
100.0000 mL | Freq: Once | INTRAMUSCULAR | Status: AC | PRN
  Administered 2023-03-06: 100 mL via INTRAVENOUS

## 2023-03-06 MED ORDER — KETOROLAC TROMETHAMINE 15 MG/ML IJ SOLN
15.0000 mg | Freq: Once | INTRAMUSCULAR | Status: AC
  Administered 2023-03-06: 15 mg via INTRAVENOUS
  Filled 2023-03-06: qty 1

## 2023-03-06 MED ORDER — SENNOSIDES-DOCUSATE SODIUM 8.6-50 MG PO TABS: 1.0000 | ORAL_TABLET | Freq: Two times a day (BID) | ORAL | 0 refills | Status: AC

## 2023-03-06 NOTE — Discharge Instructions (Signed)
You can pick up MiraLAX over-the-counter and take 3 capfuls in the morning and 1 capful in the afternoon daily.  I have also sent a medication to your pharmacy for you to take as prescribed.  Once you start having bowel movements daily, you can slowly decrease the amount of these medications you take daily until you are completely off of them.  You can take ibuprofen and Tylenol to help with the pain symptoms which should improve once the constipation is resolved.

## 2023-03-06 NOTE — Telephone Encounter (Signed)
    Chief Complaint: Constipation.Did enema at home yesterday with results, but "have not emptied."  Symptoms: Above Frequency: This week Pertinent Negatives: Patient denies  Disposition: [] ED /[x] Urgent Care (no appt availability in office) / [] Appointment(In office/virtual)/ []  [facility]/ [] Home Care/ [] Refused Recommended Disposition /[] [facility]/ []  Follow-up with PCP Additional Notes: Pt. Asks for appointment, done, but "will probably go to UC today."  Reason for Disposition  Unable to have a bowel movement (BM) without laxative or enema  Answer Assessment - Initial Assessment Questions 1. STOOL PATTERN OR FREQUENCY: "How often do you have a bowel movement (BM)?"  (Normal range: 3 times a day to every 3 days)  "When was your last BM?"       Daily 2. STRAINING: "Do you have to strain to have a BM?"      Yes 3. RECTAL PAIN: "Does your rectum hurt when the stool comes out?" If Yes, ask: "Do you have hemorrhoids? How bad is the pain?"  (Scale 1-10; or mild, moderate, severe)     No 4. STOOL COMPOSITION: "Are the stools hard?"      Hard 5. BLOOD ON STOOLS: "Has there been any blood on the toilet tissue or on the surface of the BM?" If Yes, ask: "When was the last time?"     No 6. CHRONIC CONSTIPATION: "Is this a new problem for you?"  If No, ask: "How long have you had this problem?" (days, weeks, months)      No 7. CHANGES IN DIET OR HYDRATION: "Have there been any recent changes in your diet?" "How much fluids are you drinking on a daily basis?"  "How much have you had to drink today?"     No 8. MEDICINES: "Have you been taking any new medicines?" "Are you taking any narcotic pain medicines?" (e.g., Dilaudid, morphine, Percocet, Vicodin)     No 9. LAXATIVES: "Have you been using any stool softeners, laxatives, or enemas?"  If Yes, ask "What, how often, and when was the last time?"     Used enema 10. ACTIVITY:  "How much walking do you do every day?"  "Has  your activity level decreased in the past week?"        Yes 11. CAUSE: "What do you think is causing the constipation?"        Unsure 12. OTHER SYMPTOMS: "Do you have any other symptoms?" (e.g., abdomen pain, bloating, fever, vomiting)       Right hip pain 13. MEDICAL HISTORY: "Do you have a history of hemorrhoids, rectal fissures, or rectal surgery or rectal abscess?"         No 14. PREGNANCY: "Is there any chance you are pregnant?" "When was your last menstrual period?"       N/a  Protocols used: Constipation-A-AH

## 2023-03-06 NOTE — ED Provider Notes (Signed)
[facility] Provider Note    Event Date/Time   First MD Initiated Contact with Patient 03/06/23 1733     (approximate)   History   Constipation and Flank Pain   HPI [name] [name] is a 67 y.o. male presenting today for abdominal pain.  Patient states for the past 5 days he has had worsening abdominal pain in his right lower quadrant.  It is constant.  Some radiation to his back.  Has had nausea but no vomiting.  Reports severe constipation as well.  He did an enema last night which produces small amount of stool but otherwise has not had a bowel movement in 5 days.  No diarrhea, melena, hematochezia.  No dysuria.  Has had prior esophageal repair as well as appendectomy for abdominal surgeries.     Physical Exam   Triage Vital Signs: ED Triage Vitals  Encounter Vitals Group     BP 03/06/23 1238 (!) 157/72     Systolic BP Percentile --      Diastolic BP Percentile --      Pulse Rate 03/06/23 1238 79     Resp 03/06/23 1238 16     Temp 03/06/23 1238 98.6 F (37 C)     Temp Source 03/06/23 1238 Oral     SpO2 03/06/23 1238 97 %     Weight 03/06/23 1240 174 lb (78.9 kg)     Height 03/06/23 1240 6' (1.829 m)     Head Circumference --      Peak Flow --      Pain Score 03/06/23 1240 8     Pain Loc --      Pain Education --      Exclude from Growth Chart --     Most recent vital signs: Vitals:   03/06/23 1238 03/06/23 1653  BP: (!) 157/72 (!) 171/82  Pulse: 79 76  Resp: 16 18  Temp: 98.6 F (37 C) 98.2 F (36.8 C)  SpO2: 97% 98%   Physical Exam: I have reviewed the vital signs and nursing notes. General: Awake, alert, no acute distress.  Nontoxic appearing. Head:  Atraumatic, normocephalic.   ENT:  EOM intact, PERRL. Oral mucosa is pink and moist with no lesions. Neck: Neck is supple with full range of motion, No meningeal signs. Cardiovascular:  RRR, No murmurs. Peripheral pulses palpable and equal bilaterally. Respiratory:   Symmetrical chest wall expansion.  No rhonchi, rales, or wheezes.  Good air movement throughout.  No use of accessory muscles.   Musculoskeletal:  No cyanosis or edema. Moving extremities with full ROM Abdomen:  Soft, tenderness to deep palpation in right lower quadrant, nondistended. Neuro:  GCS 15, moving all four extremities, interacting appropriately. Speech clear. Psych:  Calm, appropriate.   Skin:  Warm, dry, no rash.    ED Results / Procedures / Treatments   Labs (all labs ordered are listed, but only abnormal results are displayed) Labs Reviewed  COMPREHENSIVE METABOLIC PANEL - Abnormal; Notable for the following components:      Result Value   Potassium 3.2 (*)    Calcium 8.4 (*)    Total Protein 6.3 (*)    All other components within normal limits  CBC - Abnormal; Notable for the following components:   WBC 3.8 (*)    All other components within normal limits  URINALYSIS, ROUTINE W REFLEX MICROSCOPIC - Abnormal; Notable for the following components:   Color, Urine STRAW (*)    APPearance CLEAR (*)  Specific Gravity, Urine 1.004 (*)    All other components within normal limits     EKG    RADIOLOGY Independently interpreted CT abdomen/pelvis with no acute pathology   PROCEDURES:  Critical Care performed: No  Procedures   MEDICATIONS ORDERED IN ED: Medications  ketorolac (TORADOL) 15 MG/ML injection 15 mg (15 mg Intravenous Given 03/06/23 1827)  iohexol (OMNIPAQUE) 300 MG/ML solution 100 mL (100 mLs Intravenous Contrast Given 03/06/23 1916)     IMPRESSION / MDM / ASSESSMENT AND PLAN / ED COURSE  I reviewed the triage vital signs and the nursing notes.                              Differential diagnosis includes, but is not limited to, colitis, enteritis, SBO, constipation, pyelonephritis, acute cystitis  Patient's presentation is most consistent with acute complicated illness / injury requiring diagnostic workup.  Patient is a 67 year old male  presenting today for worsening right sided abdominal pain associated with nausea and constipation.  Tenderness to palpation in the right lower region.  Vital signs stable otherwise.  Laboratory workup for the most part reassuring.  CT abdomen/pelvis ordered for further evaluation.  CT abdomen/pelvis shows evidence of constipation but no other acute intra-abdominal infections or obstructions.  Patient otherwise stable on reassessment.  Will discharge with bowel regimen going home and told to follow-up with PCP.  He was made aware of incidental finding of pancreatic cyst which is not the source of his symptoms today.  Agreeable with plan given strict return precautions.     FINAL CLINICAL IMPRESSION(S) / ED DIAGNOSES   Final diagnoses:  Constipation, unspecified constipation type  Right lower quadrant abdominal pain     Rx / DC Orders   ED Discharge Orders          Ordered    senna-docusate (SENOKOT-S) 8.6-50 MG tablet  2 times daily        03/06/23 1940             Note:  This document was prepared using Dragon voice recognition software and may include unintentional dictation errors.   Janith Lima, MD 03/06/23 2007

## 2023-03-06 NOTE — ED Notes (Signed)
Patient transported to CT 

## 2023-03-06 NOTE — ED Triage Notes (Signed)
Says has been constipated for several days.  Took enema and had bm yesterday--hard.  Since xmas day having right lower flank pain.  No urinary problems.  No fever.

## 2023-03-07 ENCOUNTER — Ambulatory Visit: Payer: HMO

## 2023-03-09 ENCOUNTER — Ambulatory Visit: Payer: HMO | Admitting: Family Medicine

## 2023-03-23 ENCOUNTER — Telehealth: Payer: Self-pay

## 2023-03-23 NOTE — Telephone Encounter (Signed)
 Appt made 03/26/23 for hosp f/u

## 2023-03-23 NOTE — Telephone Encounter (Signed)
 Copied from CRM [id number]. Topic: Appointment Scheduling - Scheduling Inquiry for Clinic >> Mar 23, 2023  3:28 PM Turkey B wrote: Reason for CRM: pt called in, needs to schedule hosp fu, dischadrged from [facility] 12/27, soonest appt shows 01/29

## 2023-03-25 ENCOUNTER — Ambulatory Visit: Payer: HMO | Admitting: Student in an Organized Health Care Education/Training Program

## 2023-03-25 ENCOUNTER — Encounter: Payer: Self-pay | Admitting: Student in an Organized Health Care Education/Training Program

## 2023-03-25 VITALS — BP 138/80 | HR 81 | Temp 97.1°F | Ht 72.0 in | Wt 184.0 lb

## 2023-03-25 DIAGNOSIS — J479 Bronchiectasis, uncomplicated: Secondary | ICD-10-CM | POA: Diagnosis not present

## 2023-03-25 DIAGNOSIS — J4550 Severe persistent asthma, uncomplicated: Secondary | ICD-10-CM

## 2023-03-25 MED ORDER — FLUTICASONE-SALMETEROL 250-50 MCG/ACT IN AEPB: 1.0000 | INHALATION_SPRAY | Freq: Two times a day (BID) | RESPIRATORY_TRACT | 11 refills | Status: AC

## 2023-03-25 NOTE — Progress Notes (Signed)
 Assessment & Plan:   1. Severe persistent asthma 2. Bronchiectasis without complication  Mr. Ardis is presenting for follow up on severe persistent asthma with recurrent exacerbations. He has not had an exacerbation of asthma since our last visit, and the cold he caught over the holidays is self resolving. He has underwent Nissen fundoplication in October of 2024 which was uncomplicated. Physical exam today is re-assuring and there is no wheeze noted. He continues to use his flutter device and incentive spirometer. We had also prescribed hypertonic saline during a prior visit. He's had definitive treatment for chronic reflux disease with Nissen fundoplication and felt much improved after his surgery.   He overall has a T-helper cell type 1 profile, with no eosinophilia noted on previous blood work, negative allergen testing (negative IgE, no allergens detected), negative aspergillus testing, and PFT's with normal spirometry. FENO previously was normal at 20 ppb.  We had stepped up therapy to high dose ICS (500-50 advair) during a prior visit, and will proceed with stepping down therapy to 250-50 now that he's had his surgery. We will continue to monitor symptoms, and if continues to exacerbate or proves difficult to step down therapy, will consider biologic therapy with tezepelumab. We'd previously tried Tezepelumab but this was not covered by insurance.    As to the infiltrates on the CT, and mild bronchiectasis noted on CT, this is directly related to his recurrent aspiration secondary to reflux. He did have induced sputa previously with cultures all returning negative (fungal cultures grew candida which we will ignore). Will continue with pulmonary clearance regimen for the time being and re-evaluate with repeat imaging in the future if symptoms persist.  - fluticasone -salmeterol (ADVAIR) 250-50 MCG/ACT AEPB; Inhale 1 puff into the lungs in the morning and at bedtime.  Dispense: 60 each;  Refill: 11 - Continue pulmonary clearance regimen   Return in about 6 months (around 09/22/2023).  I spent 30 minutes caring for this patient today, including preparing to see the patient, obtaining a medical history , reviewing a separately obtained history, performing a medically appropriate examination and/or evaluation, counseling and educating the patient/family/caregiver, ordering medications, tests, or procedures, documenting clinical information in the electronic health record, and independently interpreting results (not separately reported/billed) and communicating results to the patient/family/caregiver  Vergia Glasgow, MD [facility] 03/25/2023 12:33 PM    End of visit medications:  Meds ordered this encounter  Medications   fluticasone -salmeterol (ADVAIR) 250-50 MCG/ACT AEPB    Sig: Inhale 1 puff into the lungs in the morning and at bedtime.    Dispense:  60 each    Refill:  11     Current Outpatient Medications:    albuterol  (PROVENTIL ) (2.5 MG/3ML) 0.083% nebulizer solution, Take 3 mLs (2.5 mg total) by nebulization in the morning and at bedtime. (Patient taking differently: Take 2.5 mg by nebulization as needed.), Disp: 75 mL, Rfl: 12   albuterol  (VENTOLIN  HFA) 108 (90 Base) MCG/ACT inhaler, Inhale 1-2 puffs into the lungs every 6 (six) hours as needed for wheezing or shortness of breath., Disp: 18 g, Rfl: 3   dexlansoprazole  (DEXILANT ) 60 MG capsule, Take 1 capsule (60 mg total) by mouth daily., Disp: 90 capsule, Rfl: 0   dutasteride  (AVODART ) 0.5 MG capsule, TAKE ONE CAPSULE BY MOUTH ONE TIME DAILY, Disp: 90 capsule, Rfl: 3   famotidine  (PEPCID ) 40 MG tablet, TAKE ONE TABLET BY MOUTH DAILY (Patient taking differently: Take 40 mg by mouth at bedtime.), Disp: 30 tablet, Rfl:  11   fluticasone -salmeterol (ADVAIR) 250-50 MCG/ACT AEPB, Inhale 1 puff into the lungs in the morning and at bedtime., Disp: 60 each, Rfl: 11   ketotifen (ZADITOR) 0.025 % ophthalmic  solution, Place 1 drop into both eyes daily. , Disp: , Rfl:    losartan  (COZAAR ) 100 MG tablet, Take 1 tablet (100 mg total) by mouth daily., Disp: 90 tablet, Rfl: 1   sodium chloride  HYPERTONIC 3 % nebulizer solution, Take by nebulization in the morning and at bedtime., Disp: 750 mL, Rfl: 12   tadalafil  (CIALIS ) 5 MG tablet, TAKE ONE TABLET BY MOUTH AT BEDTIME, Disp: 90 tablet, Rfl: 3   tamsulosin  (FLOMAX ) 0.4 MG CAPS capsule, TAKE ONE CAPSULE BY MOUTH ONE TIME DAILY, Disp: 90 capsule, Rfl: 3   Testosterone  1.62 % GEL, APPLY ONE PUMP TOPICALLY TO EACH SHOUDER DAILY, Disp: 75 g, Rfl: 2   verapamil  (CALAN -SR) 240 MG CR tablet, Take 1 tablet (240 mg total) by mouth 2 (two) times daily., Disp: 180 tablet, Rfl: 1   fluticasone  (FLONASE ) 50 MCG/ACT nasal spray, Place 1 spray into both nostrils daily., Disp: 18.2 mL, Rfl: 11   Subjective:   PATIENT ID: [name] [name] GENDER: male DOB: 1955-10-12, MRN: [id number]  Chief Complaint  Patient presents with   Follow-up    Cough with white sputum. Sick around Christmas time. Doing good after hernia repair.    HPI  Mr. Lizardi is a pleasant 68 year old male presenting to clinic for follow up. He had his Nissen fundoplication in October of 2024 and presents for routine follow up.  Since our last visit, he's had his surgery 12/2022 which was uncomplicated. He felt great after the surgery with no symptoms and significant improvement in his cough. He was doing excellent until christmas when he caught a cold from his grandchild. This has improved drastically but he continues to have an occasional cough that is productive of clear sputum. Denies fevers or chills, denies chest pain. No shortness of breath reported. He continues on Advair 500-50. He is compliant with the flutter device and incentive spirometer, and washes his mouth with every use of Advair.    He was initially seen by me in September of 2023 for symptoms of cough productive of whitish  (sometimes yellow) sputum, associated with shortness of breath, exertional dyspnea, and wheezing. He was diagnosed with asthma many years ago by Dr. Almeda Jacobs. He has had multiple exacerbations of his asthma, requiring antibiotics and steroids.   He does not have any pets and is a non-smoker (thou reports previous second hand exposure). He does not have any chest pain or tightness, nor does he have any fevers, chills, night sweats, or weight loss. He is compliant with his medications and is using Advair twice daily.   He's previously been worked up at [facility] by Dede Fanny, MD. At that time, he was referred for low immunoglobulin levels. A high resolution CT chest in [facility] at [facility] suggested possible early bronchiectasis. He was referred to ENT and GI for further workup of sinusitis and reflux disease. Given concern for silent aspiration triggering his asthma, further workup was pursued. He was eventually referred to thoracic surgery after esophageal manometry showed a medium to large sized hiatal hernia. Barium swallow in January of 2023 didn't show a hiatal hernia but did show severe GERD with signs of aspiration. He has since underwent robot assisted laparoscopic repair of his paraesophageal hernia and fundoplication.   He is a never smoker, but reports second hand  exposure. He used to work at [employer] and retired recently. He continues to be active in his church. He reports significant mold exposure at [facility], and suspects there might be mold exposure at church. He does not have any pets.  Ancillary information including prior medications, full medical/surgical/family/social histories, and PFTs (when available) are listed below and have been reviewed.   Review of Systems  Constitutional:  Negative for chills and fever.  Respiratory:  Positive for cough (productive of clear sputum). Negative for sputum production, shortness of breath and wheezing.   Cardiovascular:  Negative for chest pain.  Skin:   Negative for rash.  All other systems reviewed and are negative.    Objective:   Vitals:   03/25/23 1040  BP: 138/80  Pulse: 81  Temp: (!) 97.1 F (36.2 C)  SpO2: 97%  Weight: 184 lb (83.5 kg)  Height: 6' (1.829 m)   97% on RA  BMI Readings from Last 3 Encounters:  03/25/23 24.95 kg/m  03/06/23 23.60 kg/m  01/19/23 24.43 kg/m   Wt Readings from Last 3 Encounters:  03/25/23 184 lb (83.5 kg)  03/06/23 174 lb (78.9 kg)  01/19/23 180 lb 1.6 oz (81.7 kg)    Physical Exam Constitutional:      Appearance: Normal appearance. He is normal weight.  HENT:     Head: Normocephalic.     Mouth/Throat:     Mouth: Mucous membranes are moist.  Cardiovascular:     Rate and Rhythm: Normal rate and regular rhythm.     Pulses: Normal pulses.     Heart sounds: Normal heart sounds.  Pulmonary:     Effort: Pulmonary effort is normal.     Breath sounds: No wheezing, rhonchi or rales.  Musculoskeletal:        General: Normal range of motion.     Cervical back: Normal range of motion and neck supple.  Skin:    General: Skin is warm.  Neurological:     General: No focal deficit present.     Mental Status: He is alert and oriented to person, place, and time.       Ancillary Information    Past Medical History:  Diagnosis Date   Allergy    Annual physical exam 01/19/2023   Arthritis    Knees & hands   Asthma    BP (high blood pressure) 11/07/2014   Should stop combination of Tekturna (DRI)  and ARB    Cataract 2018   Closed fracture of lateral malleolus of left fibula 07/23/2020   GERD (gastroesophageal reflux disease)    Hypertension    HYPERTENSION, SEVERE 03/15/2007   Qualifier: Diagnosis of  By: Cala Castleman     Prostate enlargement    Sleep apnea    Status post total knee replacement using cement, left 04/01/2019   Stress fracture of tibia and fibula 07/18/2020     Family History  Problem Relation Age of Onset   Cancer Mother        ovarian   Glaucoma  Mother    Cancer Father        prostate   Hypertension Sister    Asthma Sister    Alcohol abuse Paternal Uncle    Liver disease Paternal Uncle    Cancer - Prostate Paternal Uncle    Alcohol abuse Paternal Uncle    Cancer Maternal Uncle        lung; two mat uncles   Cancer Paternal Grandmother  breast   Cancer Paternal Aunt        lung cancer in a smoker   Arthritis Maternal Grandfather    Arthritis Maternal Grandmother    ADD / ADHD Son    Anxiety disorder Son    Cancer Maternal Aunt    Cancer Maternal Uncle      Past Surgical History:  Procedure Laterality Date   APPENDECTOMY  1990   Dr Marquita Situ   COLONOSCOPY  2008   Dr Marquita Situ   COLONOSCOPY WITH PROPOFOL  N/A 08/13/2016   Procedure: COLONOSCOPY WITH PROPOFOL ;  Surgeon: Marshall Skeeter, MD;  Location: [facility] ENDOSCOPY;  Service: Endoscopy;  Laterality: N/A;   FRACTURE SURGERY  April 2023   Left Ankle   HEMORRHOID SURGERY     HERNIA REPAIR  01/20/2014   umbilical hernia   JOINT REPLACEMENT  Feb 2021   Left knee replacement   KNEE ARTHROSCOPY Left    KNEE SURGERY Left 1986   NASAL SEPTUM SURGERY     TONSILLECTOMY     TOTAL KNEE ARTHROPLASTY Left 03/29/2019   Procedure: TOTAL KNEE ARTHROPLASTY;  Surgeon: Elner Hahn, MD;  Location: [facility] ORS;  Service: Orthopedics;  Laterality: Left;   VASECTOMY      Social History   Socioeconomic History   Marital status: Married    Spouse name: Not on file   Number of children: Not on file   Years of education: Not on file   Highest education level: Master's degree (e.g., MA, MS, MEng, MEd, MSW, MBA)  Occupational History   Not on file  Tobacco Use   Smoking status: Never   Smokeless tobacco: Never   Tobacco comments:    2nd hand smoke growing up  Vaping Use   Vaping status: Never Used  Substance and Sexual Activity   Alcohol use: Not Currently    Comment: occasionally   Drug use: Never   Sexual activity: Yes    Birth control/protection: Surgical  Other  Topics Concern   Not on file  Social History Narrative   Not on file   Social Drivers of Health   Financial Resource Strain: Low Risk  (01/16/2023)   Overall Financial Resource Strain (CARDIA)    Difficulty of Paying Living Expenses: Not hard at all  Food Insecurity: No Food Insecurity (01/16/2023)   Hunger Vital Sign    Worried About Running Out of Food in the Last Year: Never true    Ran Out of Food in the Last Year: Never true  Transportation Needs: No Transportation Needs (01/16/2023)   PRAPARE - Administrator, Civil Service (Medical): No    Lack of Transportation (Non-Medical): No  Physical Activity: Insufficiently Active (01/16/2023)   Exercise Vital Sign    Days of Exercise per Week: 4 days    Minutes of Exercise per Session: 30 min  Stress: No Stress Concern Present (01/16/2023)   [employer] of Occupational Health - Occupational Stress Questionnaire    Feeling of Stress : Not at all  Social Connections: Socially Integrated (01/16/2023)   Social Connection and Isolation Panel [NHANES]    Frequency of Communication with Friends and Family: Twice a week    Frequency of Social Gatherings with Friends and Family: More than three times a week    Attends Religious Services: More than 4 times per year    Active Member of [employer] or Organizations: Yes    Attends Banker Meetings: More than 4 times per year    Marital  Status: Married  Catering manager Violence: Not At Risk (09/24/2022)   Humiliation, Afraid, Rape, and Kick questionnaire    Fear of Current or Ex-Partner: No    Emotionally Abused: No    Physically Abused: No    Sexually Abused: No     Allergies  Allergen Reactions   Hydralazine  Hives and Shortness Of Breath    Chest tightness, severe headache, dyspnea Other reaction(s): Chest Pain, Headache, Respiratory Distress   Gluten Meal Diarrhea    bloating   Moxifloxacin Hives    Avelox   Pseudoephedrine Other (See Comments)    Confusion,  passed out, numbness   Sulfa Antibiotics Hives   Sulfonamide Derivatives Hives     CBC    Component Value Date/Time   WBC 3.8 (L) 03/06/2023 1244   RBC 4.42 03/06/2023 1244   HGB 14.0 03/06/2023 1244   HGB 12.7 (L) 01/19/2023 1417   HCT 41.5 03/06/2023 1244   HCT 38.5 01/19/2023 1417   PLT 155 03/06/2023 1244   PLT 181 01/19/2023 1417   MCV 93.9 03/06/2023 1244   MCV 94 01/19/2023 1417   MCH 31.7 03/06/2023 1244   MCHC 33.7 03/06/2023 1244   RDW 14.2 03/06/2023 1244   RDW 12.8 01/19/2023 1417   LYMPHSABS 0.6 (L) 02/01/2022 0944   LYMPHSABS 2.2 11/20/2021 0850   MONOABS 0.7 02/01/2022 0944   EOSABS 0.0 02/01/2022 0944   EOSABS 0.1 11/20/2021 0850   BASOSABS 0.1 02/01/2022 0944   BASOSABS 0.1 11/20/2021 0850    Pulmonary Functions Testing Results:    Latest Ref Rng & Units 04/03/2022   11:30 AM  PFT Results  FVC-Pre L 5.13   FVC-Predicted Pre % 104   FVC-Post L 5.27   FVC-Predicted Post % 107   Pre FEV1/FVC % % 90   Post FEV1/FCV % % 90   FEV1-Pre L 4.61   FEV1-Predicted Pre % 126   FEV1-Post L 4.75   DLCO uncorrected ml/min/mmHg 35.99   DLCO UNC% % 127   DLVA Predicted % 107   TLC L 6.80   TLC % Predicted % 91   RV % Predicted % 78     Outpatient Medications Prior to Visit  Medication Sig Dispense Refill   albuterol  (PROVENTIL ) (2.5 MG/3ML) 0.083% nebulizer solution Take 3 mLs (2.5 mg total) by nebulization in the morning and at bedtime. (Patient taking differently: Take 2.5 mg by nebulization as needed.) 75 mL 12   albuterol  (VENTOLIN  HFA) 108 (90 Base) MCG/ACT inhaler Inhale 1-2 puffs into the lungs every 6 (six) hours as needed for wheezing or shortness of breath. 18 g 3   dexlansoprazole  (DEXILANT ) 60 MG capsule Take 1 capsule (60 mg total) by mouth daily. 90 capsule 0   dutasteride  (AVODART ) 0.5 MG capsule TAKE ONE CAPSULE BY MOUTH ONE TIME DAILY 90 capsule 3   famotidine  (PEPCID ) 40 MG tablet TAKE ONE TABLET BY MOUTH DAILY (Patient taking differently:  Take 40 mg by mouth at bedtime.) 30 tablet 11   ketotifen (ZADITOR) 0.025 % ophthalmic solution Place 1 drop into both eyes daily.      losartan  (COZAAR ) 100 MG tablet Take 1 tablet (100 mg total) by mouth daily. 90 tablet 1   sodium chloride  HYPERTONIC 3 % nebulizer solution Take by nebulization in the morning and at bedtime. 750 mL 12   tadalafil  (CIALIS ) 5 MG tablet TAKE ONE TABLET BY MOUTH AT BEDTIME 90 tablet 3   tamsulosin  (FLOMAX ) 0.4 MG CAPS capsule TAKE ONE CAPSULE BY  MOUTH ONE TIME DAILY 90 capsule 3   Testosterone  1.62 % GEL APPLY ONE PUMP TOPICALLY TO EACH SHOUDER DAILY 75 g 2   verapamil  (CALAN -SR) 240 MG CR tablet Take 1 tablet (240 mg total) by mouth 2 (two) times daily. 180 tablet 1   fluticasone -salmeterol (ADVAIR) 500-50 MCG/ACT AEPB Inhale 1 puff into the lungs in the morning and at bedtime. 60 each 6   fluticasone  (FLONASE ) 50 MCG/ACT nasal spray Place 1 spray into both nostrils daily. 18.2 mL 11   No facility-administered medications prior to visit.

## 2023-03-26 ENCOUNTER — Encounter: Payer: Self-pay | Admitting: Family Medicine

## 2023-03-26 ENCOUNTER — Ambulatory Visit (INDEPENDENT_AMBULATORY_CARE_PROVIDER_SITE_OTHER): Payer: HMO | Admitting: Family Medicine

## 2023-03-26 ENCOUNTER — Ambulatory Visit
Admission: RE | Admit: 2023-03-26 | Discharge: 2023-03-26 | Disposition: A | Discharge status: Home / Self Care | Payer: HMO | Source: Ambulatory Visit | Attending: Family Medicine | Admitting: Family Medicine

## 2023-03-26 ENCOUNTER — Ambulatory Visit
Admission: RE | Admit: 2023-03-26 | Discharge: 2023-03-26 | Disposition: A | Discharge status: Home / Self Care | Payer: HMO | Attending: Family Medicine | Admitting: Family Medicine

## 2023-03-26 VITALS — BP 139/65 | HR 90 | Ht 72.0 in | Wt 182.0 lb

## 2023-03-26 DIAGNOSIS — M25551 Pain in right hip: Secondary | ICD-10-CM | POA: Insufficient documentation

## 2023-03-26 DIAGNOSIS — K862 Cyst of pancreas: Secondary | ICD-10-CM | POA: Insufficient documentation

## 2023-03-26 DIAGNOSIS — M160 Bilateral primary osteoarthritis of hip: Secondary | ICD-10-CM | POA: Diagnosis not present

## 2023-03-26 NOTE — Progress Notes (Addendum)
Established patient visit   Patient: [name] [name]   DOB: [birth year]   68 y.o. Male  MRN: [id number] Visit Date: 03/26/2023  Today's healthcare provider: [name], MD   Chief Complaint  Patient presents with  . Follow-up    Hospital f/u Right hip--still hurts front /back area, but better than it was and found cyst on the pancreas   Subjective     HPI     Follow-up    Additional comments: Hospital f/u Right hip--still hurts front /back area, but better than it was and found cyst on the pancreas      Last edited by [name], MD on 03/26/2023 10:53 AM.       Discussed the use of AI scribe software for clinical note transcription with the patient, who gave verbal consent to proceed.  History of Present Illness   The patient, a 68 year old individual, presented for a follow-up visit after an emergency department (ED) evaluation on December 27th. The ED visit was prompted by constipation and right lower quadrant abdominal pain. A CT scan performed during the ED visit was unremarkable but noted a pancreatic cyst, for which a two-year follow-up imaging was recommended.  The patient's current chief complaint is right hip pain, which he describes as sharp. The pain is located in the hip, extending to the back and the groin area. The pain has been present for approximately three weeks and was rated as a 9 on the pain scale during the ED visit. The patient has been managing the pain with ibuprofen and Tylenol, which he reports as helpful. The pain is particularly noticeable upon waking and prevents the patient from sleeping on his right side. The patient has been applying heat to the area for relief.  The patient has a history of a knee replacement surgery in October, from which he has recovered well. He also reports a small slice taken from the top of the hip during a previous ankle surgery. The patient denies any recent falls or injuries that  could have triggered the hip pain.  The patient also reports a history of constipation, which was severe around Christmas but has since resolved with the use of a laxative. The patient denies any urinary issues or back pain. He is planning a trip to the [place] in May.     ED evaluation on 03/06/23 for constipation found on CT  Also found incidental pancreatic cyst that was not associated with abdominal pain     Past Medical History:  Diagnosis Date  . Allergy   . Annual physical exam 01/19/2023  . Arthritis    Knees & hands  . Asthma   . BP (high blood pressure) 11/07/2014   Should stop combination of Tekturna (DRI)  and ARB   . Cataract 2018  . Closed fracture of lateral malleolus of left fibula 07/23/2020  . GERD (gastroesophageal reflux disease)   . Hypertension   . HYPERTENSION, SEVERE 03/15/2007   Qualifier: Diagnosis of  By: Vernie Murders    . Prostate enlargement   . Sleep apnea   . Status post total knee replacement using cement, left 04/01/2019  . Stress fracture of tibia and fibula 07/18/2020    Medications: Outpatient Medications Prior to Visit  Medication Sig  . albuterol (PROVENTIL) (2.5 MG/3ML) 0.083% nebulizer solution Take 3 mLs (2.5 mg total) by nebulization in the morning and at bedtime. (Patient taking differently: Take 2.5 mg by nebulization as needed.)  . albuterol (  VENTOLIN HFA) 108 (90 Base) MCG/ACT inhaler Inhale 1-2 puffs into the lungs every 6 (six) hours as needed for wheezing or shortness of breath.  . dexlansoprazole (DEXILANT) 60 MG capsule Take 1 capsule (60 mg total) by mouth daily.  Marland Kitchen dutasteride (AVODART) 0.5 MG capsule TAKE ONE CAPSULE BY MOUTH ONE TIME DAILY  . famotidine (PEPCID) 40 MG tablet TAKE ONE TABLET BY MOUTH DAILY (Patient taking differently: Take 40 mg by mouth at bedtime.)  . fluticasone-salmeterol (ADVAIR) 250-50 MCG/ACT AEPB Inhale 1 puff into the lungs in the morning and at bedtime.  Marland Kitchen ketotifen (ZADITOR) 0.025 %  ophthalmic solution Place 1 drop into both eyes daily.   Marland Kitchen losartan (COZAAR) 100 MG tablet Take 1 tablet (100 mg total) by mouth daily.  . sodium chloride HYPERTONIC 3 % nebulizer solution Take by nebulization in the morning and at bedtime.  . tadalafil (CIALIS) 5 MG tablet TAKE ONE TABLET BY MOUTH AT BEDTIME  . tamsulosin (FLOMAX) 0.4 MG CAPS capsule TAKE ONE CAPSULE BY MOUTH ONE TIME DAILY  . Testosterone 1.62 % GEL APPLY ONE PUMP TOPICALLY TO EACH SHOUDER DAILY  . verapamil (CALAN-SR) 240 MG CR tablet Take 1 tablet (240 mg total) by mouth 2 (two) times daily.  . fluticasone (FLONASE) 50 MCG/ACT nasal spray Place 1 spray into both nostrils daily.   No facility-administered medications prior to visit.    Review of Systems  Last metabolic panel Lab Results  Component Value Date   GLUCOSE 94 03/06/2023   NA 136 03/06/2023   K 3.2 (L) 03/06/2023   CL 100 03/06/2023   CO2 27 03/06/2023   BUN 10 03/06/2023   CREATININE 0.76 03/06/2023   GFRNONAA >60 03/06/2023   CALCIUM 8.4 (L) 03/06/2023   PHOS 2.8 07/26/2020   PROT 6.3 (L) 03/06/2023   ALBUMIN 3.9 03/06/2023   LABGLOB 1.7 01/19/2023   AGRATIO 1.6 02/26/2022   BILITOT 0.8 03/06/2023   ALKPHOS 59 03/06/2023   AST 20 03/06/2023   ALT 15 03/06/2023   ANIONGAP 9 03/06/2023        Objective    BP 139/65 (BP Location: Right Arm, Patient Position: Sitting, Cuff Size: Normal)   Pulse 90   Ht 6' (1.829 m)   Wt 182 lb (82.6 kg)   SpO2 99%   BMI 24.68 kg/m  BP Readings from Last 3 Encounters:  03/26/23 139/65  03/25/23 138/80  03/06/23 (!) 165/76   Wt Readings from Last 3 Encounters:  03/26/23 182 lb (82.6 kg)  03/25/23 184 lb (83.5 kg)  03/06/23 174 lb (78.9 kg)        Physical Exam  Physical Exam   MUSCULOSKELETAL: Strength 5/5 in both lower extremities. Range of motion of the hips within normal limits. Tenderness to palpation of SI joint and along iliac crest       No results found for any visits on  03/26/23.  Assessment & Plan     Problem List Items Addressed This Visit       Digestive   Pancreatic cyst   Incidental 13 mm cyst in the tail of the pancreas on CT scan. Asymptomatic. Recommended follow-up imaging in two years due to age and cyst size. - Schedule follow-up imaging of the pancreas in December 2025        Other   Acute right hip pain - Primary   Sharp, radiating right hip pain for three weeks, exacerbated by lying on the right side and relieved by walking. No trauma history.  Physical exam shows tenderness along the iliac crest, reported pain in groin, with good range of motion and strength. Differential includes arthritis, ligament injury, or labral tear. CT scan from ED visit showed no musculoskeletal findings. Discussed meloxicam to reduce pill burden. If x-ray shows severe arthritis, referral to orthopedics for possible injections or MRI. If x-ray is normal or shows mild findings, physical therapy and continued NSAIDs may suffice. Appears to be SI joint pain along with right hip pain - Order x-ray of the right hip - Refer to orthopedics if x-ray shows severe arthritis or if pain persists - Continue NSAIDs (ibuprofen 600 mg) as needed - Consider physical therapy       Relevant Orders   DG HIP UNILAT WITH PELVIS MIN 4 VIEWS RIGHT   DG Hip Unilat W OR W/O Pelvis 2-3 Views Right        Renal Stone Punctate non-obstructing stone in the mid right kidney noted on CT scan. Asymptomatic. No bladder wall thickening or other significant findings. - Monitor for symptoms such as hematuria or severe back pain - Ensure adequate hydration  Constipation Resolved constipation with laxatives around the time of ED visit. No current issues.         No follow-ups on file.         [name], MD  [facility] [phone] (phone) [phone] (fax)  [facility]

## 2023-03-26 NOTE — Assessment & Plan Note (Signed)
Incidental 13 mm cyst in the tail of the pancreas on CT scan. Asymptomatic. Recommended follow-up imaging in two years due to age and cyst size. - Schedule follow-up imaging of the pancreas in December 2025

## 2023-03-26 NOTE — Assessment & Plan Note (Signed)
Sharp, radiating right hip pain for three weeks, exacerbated by lying on the right side and relieved by walking. No trauma history. Physical exam shows tenderness along the iliac crest, reported pain in groin, with good range of motion and strength. Differential includes arthritis, ligament injury, or labral tear. CT scan from ED visit showed no musculoskeletal findings. Discussed meloxicam to reduce pill burden. If x-ray shows severe arthritis, referral to orthopedics for possible injections or MRI. If x-ray is normal or shows mild findings, physical therapy and continued NSAIDs may suffice. Appears to be SI joint pain along with right hip pain - Order x-ray of the right hip - Refer to orthopedics if x-ray shows severe arthritis or if pain persists - Continue NSAIDs (ibuprofen 600 mg) as needed - Consider physical therapy

## 2023-03-26 NOTE — Addendum Note (Signed)
Addended by: Bing Neighbors on: 03/26/2023 11:33 AM   Modules accepted: Orders

## 2023-03-30 ENCOUNTER — Telehealth: Payer: Self-pay

## 2023-03-30 NOTE — Telephone Encounter (Signed)
-----   Message from [facility] D sent at 03/30/2023  7:35 AM EST -----  ----- Message ----- From: Jory Sims Sent: 03/27/2023  10:45 AM EST To: [facility]  Incoming Fax needs reviewing:

## 2023-03-31 ENCOUNTER — Ambulatory Visit (INDEPENDENT_AMBULATORY_CARE_PROVIDER_SITE_OTHER): Payer: PPO | Admitting: Internal Medicine

## 2023-03-31 ENCOUNTER — Encounter: Payer: Self-pay | Admitting: Family Medicine

## 2023-03-31 ENCOUNTER — Encounter: Payer: Self-pay | Admitting: Internal Medicine

## 2023-03-31 VITALS — BP 110/87 | HR 74 | Temp 98.3°F | Resp 16 | Ht 72.0 in | Wt 185.0 lb

## 2023-03-31 DIAGNOSIS — G4733 Obstructive sleep apnea (adult) (pediatric): Secondary | ICD-10-CM | POA: Diagnosis not present

## 2023-03-31 DIAGNOSIS — R053 Chronic cough: Secondary | ICD-10-CM | POA: Diagnosis not present

## 2023-03-31 DIAGNOSIS — J479 Bronchiectasis, uncomplicated: Secondary | ICD-10-CM | POA: Diagnosis not present

## 2023-03-31 DIAGNOSIS — M25552 Pain in left hip: Secondary | ICD-10-CM

## 2023-03-31 DIAGNOSIS — J4550 Severe persistent asthma, uncomplicated: Secondary | ICD-10-CM | POA: Diagnosis not present

## 2023-03-31 NOTE — Progress Notes (Signed)
[facility] [address] [place], [place] [address]  Pulmonary Sleep Medicine   Office Visit Note  Patient Name: [name] [name] DOB: October 31, 1955 MRN [id number]  Date of Service: 03/31/2023  Complaints/HPI: He has been doing well overall. He apparently had esophageal surgery for reflux. This has improved. He states he had pneumonia from the gerd prompting this. Has been compliant with CPAP. He continues to take PPI  Office Spirometry Results:     ROS  General: (-) fever, (-) chills, (-) night sweats, (-) weakness Skin: (-) rashes, (-) itching,. Eyes: (-) visual changes, (-) redness, (-) itching. Nose and Sinuses: (-) nasal stuffiness or itchiness, (-) postnasal drip, (-) nosebleeds, (-) sinus trouble. Mouth and Throat: (-) sore throat, (-) hoarseness. Neck: (-) swollen glands, (-) enlarged thyroid, (-) neck pain. Respiratory: - cough, (-) bloody sputum, - shortness of breath, - wheezing. Cardiovascular: - ankle swelling, (-) chest pain. Lymphatic: (-) lymph node enlargement. Neurologic: (-) numbness, (-) tingling. Psychiatric: (-) anxiety, (-) depression   Current Medication: Outpatient Encounter Medications as of 03/31/2023  Medication Sig   albuterol (PROVENTIL) (2.5 MG/3ML) 0.083% nebulizer solution Take 3 mLs (2.5 mg total) by nebulization in the morning and at bedtime. (Patient taking differently: Take 2.5 mg by nebulization as needed.)   albuterol (VENTOLIN HFA) 108 (90 Base) MCG/ACT inhaler Inhale 1-2 puffs into the lungs every 6 (six) hours as needed for wheezing or shortness of breath.   dexlansoprazole (DEXILANT) 60 MG capsule Take 1 capsule (60 mg total) by mouth daily.   dutasteride (AVODART) 0.5 MG capsule TAKE ONE CAPSULE BY MOUTH ONE TIME DAILY   famotidine (PEPCID) 40 MG tablet TAKE ONE TABLET BY MOUTH DAILY (Patient taking differently: Take 40 mg by mouth at bedtime.)   fluticasone-salmeterol (ADVAIR) 250-50 MCG/ACT AEPB Inhale 1 puff into  the lungs in the morning and at bedtime.   ketotifen (ZADITOR) 0.025 % ophthalmic solution Place 1 drop into both eyes daily.    losartan (COZAAR) 100 MG tablet Take 1 tablet (100 mg total) by mouth daily.   sodium chloride HYPERTONIC 3 % nebulizer solution Take by nebulization in the morning and at bedtime.   tadalafil (CIALIS) 5 MG tablet TAKE ONE TABLET BY MOUTH AT BEDTIME   tamsulosin (FLOMAX) 0.4 MG CAPS capsule TAKE ONE CAPSULE BY MOUTH ONE TIME DAILY   Testosterone 1.62 % GEL APPLY ONE PUMP TOPICALLY TO EACH SHOUDER DAILY   verapamil (CALAN-SR) 240 MG CR tablet Take 1 tablet (240 mg total) by mouth 2 (two) times daily.   fluticasone (FLONASE) 50 MCG/ACT nasal spray Place 1 spray into both nostrils daily.   No facility-administered encounter medications on file as of 03/31/2023.    Surgical History: Past Surgical History:  Procedure Laterality Date   APPENDECTOMY  1990   Dr Lemar Livings   COLONOSCOPY  2008   Dr Lemar Livings   COLONOSCOPY WITH PROPOFOL N/A 08/13/2016   Procedure: COLONOSCOPY WITH PROPOFOL;  Surgeon: Earline Mayotte, MD;  Location: [facility] ENDOSCOPY;  Service: Endoscopy;  Laterality: N/A;   FRACTURE SURGERY  April 2023   Left Ankle   HEMORRHOID SURGERY     HERNIA REPAIR  01/20/2014   umbilical hernia   JOINT REPLACEMENT  Feb 2021   Left knee replacement   KNEE ARTHROSCOPY Left    KNEE SURGERY Left 1986   NASAL SEPTUM SURGERY     TONSILLECTOMY     TOTAL KNEE ARTHROPLASTY Left 03/29/2019   Procedure: TOTAL KNEE ARTHROPLASTY;  Surgeon: Leron Croak  J, MD;  Location: [facility] ORS;  Service: Orthopedics;  Laterality: Left;   VASECTOMY      Medical History: Past Medical History:  Diagnosis Date   Allergy    Annual physical exam 01/19/2023   Arthritis    Knees & hands   Asthma    BP (high blood pressure) 11/07/2014   Should stop combination of Tekturna (DRI)  and ARB    Cataract 2018   Closed fracture of lateral malleolus of left fibula 07/23/2020   GERD  (gastroesophageal reflux disease)    Hypertension    HYPERTENSION, SEVERE 03/15/2007   Qualifier: Diagnosis of  By: Vernie Murders     Prostate enlargement    Sleep apnea    Status post total knee replacement using cement, left 04/01/2019   Stress fracture of tibia and fibula 07/18/2020    Family History: Family History  Problem Relation Age of Onset   Cancer Mother        ovarian   Glaucoma Mother    Cancer Father        prostate   Hypertension Sister    Asthma Sister    Alcohol abuse Paternal Uncle    Liver disease Paternal Uncle    Cancer - Prostate Paternal Uncle    Alcohol abuse Paternal Uncle    Cancer Maternal Uncle        lung; two mat uncles   Cancer Paternal Grandmother        breast   Cancer Paternal Aunt        lung cancer in a smoker   Arthritis Maternal Grandfather    Arthritis Maternal Grandmother    ADD / ADHD Son    Anxiety disorder Son    Cancer Maternal Aunt    Cancer Maternal Uncle     Social History: Social History   Socioeconomic History   Marital status: Married    Spouse name: Not on file   Number of children: Not on file   Years of education: Not on file   Highest education level: Master's degree (e.g., MA, MS, MEng, MEd, MSW, MBA)  Occupational History   Not on file  Tobacco Use   Smoking status: Never   Smokeless tobacco: Never   Tobacco comments:    2nd hand smoke growing up  Vaping Use   Vaping status: Never Used  Substance and Sexual Activity   Alcohol use: Not Currently    Comment: occasionally   Drug use: Never   Sexual activity: Yes    Birth control/protection: Surgical  Other Topics Concern   Not on file  Social History Narrative   Not on file   Social Drivers of Health   Financial Resource Strain: Low Risk  (01/16/2023)   Overall Financial Resource Strain (CARDIA)    Difficulty of Paying Living Expenses: Not hard at all  Food Insecurity: No Food Insecurity (01/16/2023)   Hunger Vital Sign    Worried About  Running Out of Food in the Last Year: Never true    Ran Out of Food in the Last Year: Never true  Transportation Needs: No Transportation Needs (01/16/2023)   PRAPARE - Administrator, Civil Service (Medical): No    Lack of Transportation (Non-Medical): No  Physical Activity: Insufficiently Active (01/16/2023)   Exercise Vital Sign    Days of Exercise per Week: 4 days    Minutes of Exercise per Session: 30 min  Stress: No Stress Concern Present (01/16/2023)   [employer] of Occupational  Health - Occupational Stress Questionnaire    Feeling of Stress : Not at all  Social Connections: Socially Integrated (01/16/2023)   Social Connection and Isolation Panel [NHANES]    Frequency of Communication with Friends and Family: Twice a week    Frequency of Social Gatherings with Friends and Family: More than three times a week    Attends Religious Services: More than 4 times per year    Active Member of [employer] or Organizations: Yes    Attends Engineer, structural: More than 4 times per year    Marital Status: Married  Catering manager Violence: Not At Risk (09/24/2022)   Humiliation, Afraid, Rape, and Kick questionnaire    Fear of Current or Ex-Partner: No    Emotionally Abused: No    Physically Abused: No    Sexually Abused: No    Vital Signs: Blood pressure 110/87, pulse 74, temperature 98.3 F (36.8 C), resp. rate 16, height 6' (1.829 m), weight 185 lb (83.9 kg), SpO2 99%.  Examination: General Appearance: The patient is well-developed, well-nourished, and in no distress. Skin: Gross inspection of skin unremarkable. Head: normocephalic, no gross deformities. Eyes: no gross deformities noted. ENT: ears appear grossly normal no exudates. Neck: Supple. No thyromegaly. No LAD. Respiratory: no rhonchi noted. Cardiovascular: Normal S1 and S2 without murmur or rub. Extremities: No cyanosis. pulses are equal. Neurologic: Alert and oriented. No involuntary  movements.  LABS: Recent Results (from the past 2160 hours)  Hemoglobin A1c     Status: None   Collection Time: 01/19/23  2:17 PM  Result Value Ref Range   Hgb A1c MFr Bld 5.4 4.8 - 5.6 %    Comment:          Prediabetes: 5.7 - 6.4          Diabetes: >6.4          Glycemic control for adults with diabetes: <7.0    Est. average glucose Bld gHb Est-mCnc 108 mg/dL  CBC     Status: Abnormal   Collection Time: 01/19/23  2:17 PM  Result Value Ref Range   WBC 4.8 3.4 - 10.8 x10E3/uL   RBC 4.08 (L) 4.14 - 5.80 x10E6/uL   Hemoglobin 12.7 (L) 13.0 - 17.7 g/dL   Hematocrit 16.1 09.6 - 51.0 %   MCV 94 79 - 97 fL   MCH 31.1 26.6 - 33.0 pg   MCHC 33.0 31.5 - 35.7 g/dL   RDW 04.5 40.9 - 81.1 %   Platelets 181 150 - 450 x10E3/uL  CMP14+EGFR     Status: Abnormal   Collection Time: 01/19/23  2:17 PM  Result Value Ref Range   Glucose 122 (H) 70 - 99 mg/dL   BUN 8 8 - 27 mg/dL   Creatinine, Ser 9.14 0.76 - 1.27 mg/dL   eGFR 95 >78 GN/FAO/1.30   BUN/Creatinine Ratio 9 (L) 10 - 24   Sodium 143 134 - 144 mmol/L   Potassium 4.0 3.5 - 5.2 mmol/L   Chloride 104 96 - 106 mmol/L   CO2 27 20 - 29 mmol/L   Calcium 8.9 8.6 - 10.2 mg/dL   Total Protein 5.9 (L) 6.0 - 8.5 g/dL   Albumin 4.2 3.9 - 4.9 g/dL   Globulin, Total 1.7 1.5 - 4.5 g/dL   Bilirubin Total 0.5 0.0 - 1.2 mg/dL   Alkaline Phosphatase 69 44 - 121 IU/L   AST 16 0 - 40 IU/L   ALT 13 0 - 44 IU/L  Lipid panel  Status: Abnormal   Collection Time: 01/19/23  2:17 PM  Result Value Ref Range   Cholesterol, Total 187 100 - 199 mg/dL   Triglycerides 962 0 - 149 mg/dL   HDL 57 >95 mg/dL   VLDL Cholesterol Cal 19 5 - 40 mg/dL   LDL Chol Calc (NIH) 284 (H) 0 - 99 mg/dL   Chol/HDL Ratio 3.3 0.0 - 5.0 ratio    Comment:                                   T. Chol/HDL Ratio                                             Men  Women                               1/2 Avg.Risk  3.4    3.3                                   Avg.Risk  5.0    4.4                                 2X Avg.Risk  9.6    7.1                                3X Avg.Risk 23.4   11.0   Vitamin D (25 hydroxy)     Status: None   Collection Time: 01/19/23  2:17 PM  Result Value Ref Range   Vit D, 25-Hydroxy 78.6 30.0 - 100.0 ng/mL    Comment: Vitamin D deficiency has been defined by the Institute of Medicine and an Endocrine Society practice guideline as a level of serum 25-OH vitamin D less than 20 ng/mL (1,2). The Endocrine Society went on to further define vitamin D insufficiency as a level between 21 and 29 ng/mL (2). 1. IOM (Institute of Medicine). 2010. Dietary reference    intakes for calcium and D. [place]: The    [employer]. 2. Holick MF, Binkley Mackin[place], Bischoff-Ferrari HA, et al.    Evaluation, treatment, and prevention of vitamin D    deficiency: an Endocrine Society clinical practice    guideline. JCEM. 2011 Jul; 96(7):1911-30.   Comprehensive metabolic panel     Status: Abnormal   Collection Time: 03/06/23 12:44 PM  Result Value Ref Range   Sodium 136 135 - 145 mmol/L   Potassium 3.2 (L) 3.5 - 5.1 mmol/L   Chloride 100 98 - 111 mmol/L   CO2 27 22 - 32 mmol/L   Glucose, Bld 94 70 - 99 mg/dL    Comment: Glucose reference range applies only to samples taken after fasting for at least 8 hours.   BUN 10 8 - 23 mg/dL   Creatinine, Ser 1.32 0.61 - 1.24 mg/dL   Calcium 8.4 (L) 8.9 - 10.3 mg/dL   Total Protein 6.3 (L) 6.5 - 8.1 g/dL   Albumin 3.9 3.5 - 5.0 g/dL   AST 20 15 - 41 U/L  ALT 15 0 - 44 U/L   Alkaline Phosphatase 59 38 - 126 U/L   Total Bilirubin 0.8 <1.2 mg/dL   GFR, Estimated >32 >95 mL/min    Comment: (NOTE) Calculated using the CKD-EPI Creatinine Equation (2021)    Anion gap 9 5 - 15    Comment: Performed at [facility], [address] [place], [place] [address]  CBC     Status: Abnormal   Collection Time: 03/06/23 12:44 PM  Result Value Ref Range   WBC 3.8 (L) 4.0 - 10.5 K/uL   RBC 4.42 4.22 - 5.81  MIL/uL   Hemoglobin 14.0 13.0 - 17.0 g/dL   HCT 66.0 63.0 - 16.0 %   MCV 93.9 80.0 - 100.0 fL   MCH 31.7 26.0 - 34.0 pg   MCHC 33.7 30.0 - 36.0 g/dL   RDW 10.9 32.3 - 55.7 %   Platelets 155 150 - 400 K/uL   nRBC 0.0 0.0 - 0.2 %    Comment: Performed at [facility], [address] [place], [place] [address]  Urinalysis, Routine w reflex microscopic -Urine, Clean Catch     Status: Abnormal   Collection Time: 03/06/23 12:44 PM  Result Value Ref Range   Color, Urine STRAW (A) YELLOW   APPearance CLEAR (A) CLEAR   Specific Gravity, Urine 1.004 (L) 1.005 - 1.030   pH 7.0 5.0 - 8.0   Glucose, UA NEGATIVE NEGATIVE mg/dL   Hgb urine dipstick NEGATIVE NEGATIVE   Bilirubin Urine NEGATIVE NEGATIVE   Ketones, ur NEGATIVE NEGATIVE mg/dL   Protein, ur NEGATIVE NEGATIVE mg/dL   Nitrite NEGATIVE NEGATIVE   Leukocytes,Ua NEGATIVE NEGATIVE    Comment: Performed at [facility], [address], [place], [place] [address]    Radiology: No results found.  No results found.  CT ABDOMEN PELVIS W CONTRAST Addendum Date: 03/06/2023 ADDENDUM REPORT: 03/06/2023 19:54 ADDENDUM: This addendum is created to add an additional impression. Impression #5 these in regards to pancreatic tail cyst. 5. For a cyst of this size in a patient of this age, follow-up imaging is recommended in 2 years to establish stability. Electronically Signed   By: Narda Rutherford M.D.   On: 03/06/2023 19:54   Result Date: 03/06/2023 CLINICAL DATA:  Right lower quadrant abdominal pain for 5 days, worsening. Nausea. EXAM: CT ABDOMEN AND PELVIS WITH CONTRAST TECHNIQUE: Multidetector CT imaging of the abdomen and pelvis was performed using the standard protocol following bolus administration of intravenous contrast. RADIATION DOSE REDUCTION: This exam was performed according to the departmental dose-optimization program which includes automated exposure control, adjustment of the mA and/or kV according to patient size  and/or use of iterative reconstruction technique. CONTRAST:  OMNIPAQUE IOHEXOL 300 MG/ML  SOLN COMPARISON:  None Available. FINDINGS: Lower chest: Subsegmental atelectasis or scarring in the dependent right lower lobe. Mild bilateral lower lobe bronchiolectasis. No acute airspace disease or pleural effusion. Hepatobiliary: No focal liver abnormality is seen. No gallstones, gallbladder wall thickening, or biliary dilatation. Pancreas: No ductal dilatation or inflammation. 13 mm cyst in the pancreatic tail measures 13 mm, series 2, image 32. No internal complexity by CT. Spleen: Elongated, 13.8 cm AP.  No focal abnormality. Adrenals/Urinary Tract: No adrenal nodule. Punctate nonobstructing stone in the mid right kidney. Bilateral perinephric edema is symmetric. There is no hydronephrosis. Homogeneous renal enhancement with symmetric excretion on delayed phase imaging. No suspicious renal lesion. No bladder wall thickening or focal bladder abnormality. Stomach/Bowel: History of appendectomy, the appendix is  not visualized. Small hiatal hernia. Otherwise unremarkable appearance of the stomach. No small bowel dilatation or obstruction. No bowel inflammation. Moderate volume of stool throughout the colon. Sigmoid colon is redundant. No terminal ileal inflammation. No significant diverticular disease. Vascular/Lymphatic: Aortic atherosclerosis. No aortic aneurysm. No acute vascular findings. Portal vein is patent. No enlarged lymph nodes in the abdomen or pelvis. Reproductive: Prostate is unremarkable. Other: Small bilateral fat containing inguinal hernias, larger on the left. No inflammation. No free air or ascites. No abdominopelvic collection. Musculoskeletal: There are no acute or suspicious osseous abnormalities. Mild degenerative change in the spine. No musculoskeletal findings to account for pain. IMPRESSION: 1. No acute abnormality or explanation for right lower quadrant pain. 2. Punctate nonobstructing right  renal stone. 3. Small hiatal hernia. 4. Small bilateral fat containing inguinal hernias, larger on the left. Aortic Atherosclerosis (ICD10-I70.0). Electronically Signed: By: Narda Rutherford M.D. On: 03/06/2023 19:31    Assessment and Plan: Patient Active Problem List   Diagnosis Date Noted   Pancreatic cyst 03/26/2023   Acute right hip pain 03/26/2023   Annual physical exam 01/19/2023   Encounter for annual wellness visit (AWV) in Medicare patient 01/19/2023   Subcutaneous nodule of right upper extremity 09/19/2022   Moderate persistent asthma with exacerbation 06/24/2022   Syncope and collapse 06/18/2022   Hiatal hernia 03/20/2022   Refractory chronic cough 01/02/2022   Essential hypertension 12/19/2021   Severe persistent asthma 09/26/2021   Osteopenia 07/23/2020   Vitamin D deficiency 07/23/2020   Erectile dysfunction due to arterial insufficiency 07/15/2018   Primary osteoarthritis of left knee 12/10/2015   Allergic rhinitis 11/07/2014   Benign prostatic hyperplasia with lower urinary tract symptoms 11/07/2014   Hypogonadism in male 11/07/2014   Obstructive apnea 11/07/2014   Plantar fasciitis 11/07/2014   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 Severe persistent asthma, unspecified whether complicated (Primary) This is under control now. He is doing well overall  2. Bronchiectasis without complication (HCC) No exacerbations noted. Will continue to monitor  3. Chronic cough improved  4. OSA (obstructive sleep apnea) On CPAP therapy which will be continued   General Counseling: I have discussed the findings of the evaluation and examination with Cristal Deer.  I have also discussed any further diagnostic evaluation thatmay be needed or ordered today. Fuad verbalizes understanding of the findings of todays visit. We also reviewed his medications today and discussed drug interactions  and side effects including but not limited excessive drowsiness and altered mental states. We also discussed that there is always a risk not just to him but also people around him. he has been encouraged to call the office with any questions or concerns that should arise related to todays visit.  No orders of the defined types were placed in this encounter.    Time spent: 41  I have personally obtained a history, examined the patient, evaluated laboratory and imaging results, formulated the assessment and plan and placed orders.    Yevonne Pax, MD [facility] Pulmonary and Critical Care Sleep medicine

## 2023-03-31 NOTE — Patient Instructions (Signed)

## 2023-04-02 ENCOUNTER — Ambulatory Visit: Payer: PPO | Admitting: Internal Medicine

## 2023-04-02 ENCOUNTER — Encounter: Payer: Self-pay | Admitting: Family Medicine

## 2023-04-02 ENCOUNTER — Ambulatory Visit: Payer: Self-pay

## 2023-04-02 ENCOUNTER — Other Ambulatory Visit: Payer: Self-pay | Admitting: Family Medicine

## 2023-04-02 DIAGNOSIS — M25551 Pain in right hip: Secondary | ICD-10-CM

## 2023-04-02 NOTE — Telephone Encounter (Signed)
Reviewed patient's call and responded via MyChart  Summary:  - referral placed for orthopedics  - contacted [facility] to request expedited reading of xrays

## 2023-04-02 NOTE — Telephone Encounter (Signed)
  Chief Complaint: X-ray results not yet back Symptoms: pain in hip Frequency: ongoing Pertinent Negatives: Patient denies  Disposition: [] ED /[] Urgent Care (no appt availability in office) / [] Appointment(In office/virtual)/ []  [place] [facility]/ [] Home Care/ [] Refused Recommended Disposition /[] [place] [facility]/ [x]  Follow-up with PCP Additional Notes: Pt called regarding x-ray results from last week. Pt called imaging center and was told that results are taking 3 weeks unless the ordering provider calls and requests a rush. Pt would like for provider to call for a "rush". Pt reports that pain in hip is 5/10 while taking 3 200mg  IBU.  Please advise.  Summary: hip xray resukts   Pt had an xray on his hip for hip pain.  He said his hip is still hurting really pain.  He said he needs the results asap so he can know what needs to be done.  CB#  [phone]     Reason for Disposition  [1] Caller requesting NON-URGENT health information AND [2] PCP's office is the best resource  Answer Assessment - Initial Assessment Questions 1. REASON FOR CALL or QUESTION: "What is your reason for calling today?" or "How can I best help you?" or "What question do you have that I can help answer?"     X-ray results not back  Protocols used: Information Only Call - No Triage-A-AH

## 2023-04-03 NOTE — Telephone Encounter (Signed)
Per most recent OV note, patient's asthma doing well since surgery. Will hold off on starting Te[place]sp[place] for now  Chesley Mires, PharmD, MPH, BCPS, CPP Clinical Pharmacist (Rheumatology and Pulmonology)

## 2023-04-06 ENCOUNTER — Telehealth: Payer: Self-pay | Admitting: Family Medicine

## 2023-04-06 ENCOUNTER — Other Ambulatory Visit: Payer: HMO

## 2023-04-06 ENCOUNTER — Other Ambulatory Visit: Payer: Self-pay | Admitting: Urology

## 2023-04-06 DIAGNOSIS — E291 Testicular hypofunction: Secondary | ICD-10-CM | POA: Diagnosis not present

## 2023-04-06 DIAGNOSIS — N5201 Erectile dysfunction due to arterial insufficiency: Secondary | ICD-10-CM

## 2023-04-06 DIAGNOSIS — L570 Actinic keratosis: Secondary | ICD-10-CM | POA: Diagnosis not present

## 2023-04-06 DIAGNOSIS — N401 Enlarged prostate with lower urinary tract symptoms: Secondary | ICD-10-CM

## 2023-04-06 NOTE — Telephone Encounter (Signed)
[facility] pharmacy faxed refill request for the following medications:   verapamil (CALAN-SR) 240 MG CR tablet    Please advise

## 2023-04-07 ENCOUNTER — Other Ambulatory Visit: Payer: Self-pay | Admitting: *Deleted

## 2023-04-07 DIAGNOSIS — I10 Essential (primary) hypertension: Secondary | ICD-10-CM

## 2023-04-07 LAB — TESTOSTERONE: Testosterone: 604 ng/dL (ref 264–916)

## 2023-04-07 LAB — HEMOGLOBIN AND HEMATOCRIT, BLOOD
Hematocrit: 40.6 % (ref 37.5–51.0)
Hemoglobin: 13.6 g/dL (ref 13.0–17.7)

## 2023-04-07 MED ORDER — VERAPAMIL HCL ER 240 MG PO TBCR: 240.0000 mg | EXTENDED_RELEASE_TABLET | Freq: Two times a day (BID) | ORAL | 1 refills | Status: DC

## 2023-04-07 NOTE — Telephone Encounter (Signed)
Verapamil 240 mg sent [facility]

## 2023-04-13 DIAGNOSIS — M25551 Pain in right hip: Secondary | ICD-10-CM | POA: Diagnosis not present

## 2023-04-13 DIAGNOSIS — S76011A Strain of muscle, fascia and tendon of right hip, initial encounter: Secondary | ICD-10-CM | POA: Diagnosis not present

## 2023-04-16 DIAGNOSIS — H0012 Chalazion right lower eyelid: Secondary | ICD-10-CM | POA: Diagnosis not present

## 2023-04-21 DIAGNOSIS — H0012 Chalazion right lower eyelid: Secondary | ICD-10-CM | POA: Diagnosis not present

## 2023-04-27 DIAGNOSIS — M25551 Pain in right hip: Secondary | ICD-10-CM | POA: Diagnosis not present

## 2023-05-01 DIAGNOSIS — M25551 Pain in right hip: Secondary | ICD-10-CM | POA: Diagnosis not present

## 2023-05-05 ENCOUNTER — Other Ambulatory Visit: Payer: Self-pay | Admitting: Nurse Practitioner

## 2023-05-05 ENCOUNTER — Ambulatory Visit: Payer: Self-pay | Admitting: Family Medicine

## 2023-05-05 ENCOUNTER — Ambulatory Visit
Admission: RE | Admit: 2023-05-05 | Discharge: 2023-05-05 | Disposition: A | Discharge status: Home / Self Care | Payer: HMO | Source: Ambulatory Visit | Attending: Nurse Practitioner | Admitting: Nurse Practitioner

## 2023-05-05 ENCOUNTER — Ambulatory Visit (INDEPENDENT_AMBULATORY_CARE_PROVIDER_SITE_OTHER): Payer: HMO | Admitting: Nurse Practitioner

## 2023-05-05 ENCOUNTER — Encounter: Payer: Self-pay | Admitting: Nurse Practitioner

## 2023-05-05 ENCOUNTER — Ambulatory Visit
Admission: RE | Admit: 2023-05-05 | Discharge: 2023-05-05 | Disposition: A | Discharge status: Home / Self Care | Payer: HMO | Source: Ambulatory Visit | Attending: Nurse Practitioner

## 2023-05-05 VITALS — BP 126/60 | HR 100 | Resp 18 | Ht 72.0 in | Wt 187.0 lb

## 2023-05-05 DIAGNOSIS — R5081 Fever presenting with conditions classified elsewhere: Secondary | ICD-10-CM | POA: Diagnosis not present

## 2023-05-05 DIAGNOSIS — R051 Acute cough: Secondary | ICD-10-CM

## 2023-05-05 DIAGNOSIS — J189 Pneumonia, unspecified organism: Secondary | ICD-10-CM

## 2023-05-05 DIAGNOSIS — R509 Fever, unspecified: Secondary | ICD-10-CM | POA: Diagnosis not present

## 2023-05-05 DIAGNOSIS — R058 Other specified cough: Secondary | ICD-10-CM | POA: Diagnosis not present

## 2023-05-05 LAB — POC COVID19/FLU A&B COMBO
Covid Antigen, POC: NEGATIVE
Influenza A Antigen, POC: NEGATIVE
Influenza B Antigen, POC: NEGATIVE

## 2023-05-05 MED ORDER — BENZONATATE 100 MG PO CAPS: 200.0000 mg | ORAL_CAPSULE | Freq: Three times a day (TID) | ORAL | 0 refills | Status: DC | PRN

## 2023-05-05 MED ORDER — DOXYCYCLINE HYCLATE 100 MG PO TABS: 100.0000 mg | ORAL_TABLET | Freq: Two times a day (BID) | ORAL | 0 refills | Status: AC

## 2023-05-05 MED ORDER — CEFUROXIME AXETIL 500 MG PO TABS: 500.0000 mg | ORAL_TABLET | Freq: Two times a day (BID) | ORAL | 0 refills | Status: AC

## 2023-05-05 MED ORDER — PROMETHAZINE-DM 6.25-15 MG/5ML PO SYRP: 5.0000 mL | ORAL_SOLUTION | Freq: Four times a day (QID) | ORAL | 0 refills | Status: DC | PRN

## 2023-05-05 NOTE — Telephone Encounter (Signed)
  Chief Complaint: cough Symptoms: productive cough, fever, vomiting,  Frequency: began yesterday Pertinent Negatives: Patient denies CP, diarrhea Disposition: [] ED /[] Urgent Care (no appt availability in office) / [x] Appointment(In office/virtual)/ []  [place] [facility]/ [] Home Care/ [] Refused Recommended Disposition /[] [place] [facility]/ []  Follow-up with PCP Additional Notes: Patient calls reporting worsening cough since yesterday. States it is productive and has rust colored sputum. Reports fever, vomiting last night. Reports fever of 101.59F. Per protocol, patient to be evaluated within 4 hours. First available appointment with PCP is outside of guidelines. Patient scheduled with first available provider in alternate clinic for today at 1120. Care advice reviewed, patient verbalized understanding and denies further questions at this time. Alerting PCP for review.    Reason for Disposition  [1] Fever > 101 F (38.3 C) AND [2] age > 60 years  Answer Assessment - Initial Assessment Questions 1. ONSET: "When did the cough begin?"      Yesterday 2. SEVERITY: "How bad is the cough today?"      Not as bad, but did take cough medication today. 3. SPUTUM: "Describe the color of your sputum" (none, dry cough; clear, white, yellow, green)     Green and rust colored 4. HEMOPTYSIS: "Are you coughing up any blood?" If so ask: "How much?" (flecks, streaks, tablespoons, etc.)     Streaks, thinks that is what the rust color is. 5. DIFFICULTY BREATHING: "Are you having difficulty breathing?" If Yes, ask: "How bad is it?" (e.g., mild, moderate, severe)    - MILD: No SOB at rest, mild SOB with walking, speaks normally in sentences, can lie down, no retractions, pulse < 100.    - MODERATE: SOB at rest, SOB with minimal exertion and prefers to sit, cannot lie down flat, speaks in phrases, mild retractions, audible wheezing, pulse 100-120.    - SEVERE: Very SOB at rest, speaks in single words,  struggling to breathe, sitting hunched forward, retractions, pulse > 120      When coughing, but otherwise no. 6. FEVER: "Do you have a fever?" If Yes, ask: "What is your temperature, how was it measured, and when did it start?"     101.59F forehead probe- ibuprofen was taken and it brought it down. Broke around midnight last night. 7. CARDIAC HISTORY: "Do you have any history of heart disease?" (e.g., heart attack, congestive heart failure)      Denies 8. LUNG HISTORY: "Do you have any history of lung disease?"  (e.g., pulmonary embolus, asthma, emphysema)     Asthma 9. PE RISK FACTORS: "Do you have a history of blood clots?" (or: recent major surgery, recent prolonged travel, bedridden)     Denies 10. OTHER SYMPTOMS: "Do you have any other symptoms?" (e.g., runny nose, wheezing, chest pain)       Cough, fever, vomiting, congestion, runny nose, sore throat 11. PREGNANCY: "Is there any chance you are pregnant?" "When was your last menstrual period?"       NA 12. TRAVEL: "Have you traveled out of the country in the last month?" (e.g., travel history, exposures)       Denies  Protocols used: Cough - Acute Productive-A-AH

## 2023-05-05 NOTE — Progress Notes (Addendum)
 BP 126/60   Pulse 100   Resp 18   Ht 6' (1.829 m)   Wt 187 lb (84.8 kg)   SpO2 95%   BMI 25.36 kg/m    Subjective:    Patient ID: [name] [name], male    DOB: June 04, 1955, 68 y.o.   MRN: [id number]  HPI: [name] [name] is a 68 y.o. male  Chief Complaint  Patient presents with  . Cough    Worsening onset 3 days    Discussed the use of AI scribe software for clinical note transcription with the patient, who gave verbal consent to proceed.  History of Present Illness   The patient, with a history of asthma managed with albuterol, Advair, and a nebulizer, presents with a cough and fever that started three days ago. He reports a fever of 101.54F that broke at midnight, and a productive cough with green and blood tinged sputum. He also reports vomiting, body aches, and a sore throat. 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 He has been taking over-the-counter cough medicine, Dexilant, and Delsym. The patient also reports a pulled muscle in his back from coughing, which causes pain when taking deep breaths and coughing. He has a history of frequent bronchitis.       03/26/2023   10:49 AM 09/24/2022    2:55 PM 09/19/2022    8:35 AM  Depression screen PHQ 2/9  Decreased Interest 0 0 0  Down, Depressed, Hopeless 0 0 0  PHQ - 2 Score 0 0 0  Altered sleeping 0    Tired, decreased energy 0    Change in appetite 0    Feeling bad or failure about yourself  0    Trouble concentrating 0    Moving slowly or fidgety/restless 0    Suicidal thoughts 0    PHQ-9 Score 0    Difficult doing work/chores Not difficult at all      Relevant past medical, surgical, family and social history reviewed and updated as indicated. Interim medical history since our last visit reviewed. Allergies and medications reviewed and updated.  Review of Systems  Ten systems reviewed and is negative except as mentioned in HPI      Objective:    BP 126/60    Pulse 100   Resp 18   Ht 6' (1.829 m)   Wt 187 lb (84.8 kg)   SpO2 95%   BMI 25.36 kg/m    Wt Readings from Last 3 Encounters:  05/05/23 187 lb (84.8 kg)  03/31/23 185 lb (83.9 kg)  03/26/23 182 lb (82.6 kg)    Physical Exam Vitals reviewed.  Constitutional:      Appearance: Normal appearance.  HENT:     Head: Normocephalic.     Right Ear: Tympanic membrane normal.     Left Ear: Tympanic membrane normal.     Nose: Nose normal.     Right Sinus: No maxillary sinus tenderness or frontal sinus tenderness.     Left Sinus: No maxillary sinus tenderness or frontal sinus tenderness.     Mouth/Throat:     Mouth: Mucous membranes are moist.     Pharynx: Oropharynx is clear. No oropharyngeal exudate or posterior oropharyngeal erythema.  Eyes:     Extraocular Movements: Extraocular movements intact.     Conjunctiva/sclera: Conjunctivae normal.     Pupils: Pupils are equal, round, and reactive to light.  Cardiovascular:     Rate and Rhythm:  Normal rate and regular rhythm.  Pulmonary:     Effort: Pulmonary effort is normal.     Breath sounds: Wheezing and rhonchi present.     Comments: Right lung only Lymphadenopathy:     Cervical: No cervical adenopathy.  Skin:    General: Skin is warm and dry.  Neurological:     General: No focal deficit present.     Mental Status: He is alert and oriented to person, place, and time. Mental status is at baseline.  Psychiatric:        Mood and Affect: Mood normal.        Behavior: Behavior normal.        Thought Content: Thought content normal.        Judgment: Judgment normal.    Results for orders placed or performed in visit on 05/05/23  POC Covid19/Flu A&B Antigen   Collection Time: 05/05/23 11:17 AM  Result Value Ref Range   Influenza A Antigen, POC Negative Negative   Influenza B Antigen, POC Negative Negative   Covid Antigen, POC Negative Negative       Assessment & Plan:   Problem List Items Addressed This Visit        Other   COUGH, CHRONIC - Primary   Relevant Medications   promethazine-dextromethorphan (PROMETHAZINE-DM) 6.25-15 MG/5ML syrup   benzonatate (TESSALON PERLES) 100 MG capsule   Other Relevant Orders   POC Covid19/Flu A&B Antigen (Completed)   DG Chest 2 View   Other Visit Diagnoses       Fever in other diseases            Assessment and Plan    Acute Respiratory Infection Cough, fever, and vomiting for 3 days. Asthma history. Right lung sounds abnormal on exam. Concern for pneumonia. -Order chest x-ray. -If x-ray shows pneumonia, treat with antibiotic -If x-ray does not show pneumonia, prescribe steroids for asthma exacerbation. -Prescribe Phenergan DM and Tessalon Perles for cough. -Continue Albuterol and Advair as needed. -Continue over the counter Mucinex. -Can take Zyrtec or Claritin and Flonase for sinus congestion.  Asthma Chronic condition, currently on Albuterol as needed and Advair. -Continue current management.        Follow up plan: Return if symptoms worsen or fail to improve.

## 2023-05-05 NOTE — Progress Notes (Signed)
 Chest xray positive for right lower lobe pneumonia.  Patient has been on augmentin and azithromycin multiple times. Will treat with ceftin and doxycycline.

## 2023-05-05 NOTE — Telephone Encounter (Signed)
 Pt was seen at [facility] today

## 2023-05-14 DIAGNOSIS — M25551 Pain in right hip: Secondary | ICD-10-CM | POA: Diagnosis not present

## 2023-05-20 DIAGNOSIS — M25551 Pain in right hip: Secondary | ICD-10-CM | POA: Diagnosis not present

## 2023-05-22 DIAGNOSIS — S76011D Strain of muscle, fascia and tendon of right hip, subsequent encounter: Secondary | ICD-10-CM | POA: Diagnosis not present

## 2023-05-22 DIAGNOSIS — M25551 Pain in right hip: Secondary | ICD-10-CM | POA: Diagnosis not present

## 2023-05-25 DIAGNOSIS — M25551 Pain in right hip: Secondary | ICD-10-CM | POA: Diagnosis not present

## 2023-05-27 DIAGNOSIS — M25551 Pain in right hip: Secondary | ICD-10-CM | POA: Diagnosis not present

## 2023-06-08 ENCOUNTER — Other Ambulatory Visit: Payer: Self-pay | Admitting: Family Medicine

## 2023-06-08 ENCOUNTER — Telehealth: Payer: Self-pay | Admitting: Family Medicine

## 2023-06-08 DIAGNOSIS — K219 Gastro-esophageal reflux disease without esophagitis: Secondary | ICD-10-CM

## 2023-06-08 NOTE — Telephone Encounter (Signed)
 LOV 1*16*25 NOV [id number] LRF [id number] LABS 95*28*41

## 2023-06-08 NOTE — Telephone Encounter (Signed)
 [facility] pharmacy is requesting refill dexlansoprazole (DEXILANT) 60 MG capsule   Please advise

## 2023-06-09 MED ORDER — DEXLANSOPRAZOLE 60 MG PO CPDR: 1.0000 | DELAYED_RELEASE_CAPSULE | Freq: Every day | ORAL | 1 refills | Status: DC

## 2023-06-19 ENCOUNTER — Telehealth: Payer: Self-pay | Admitting: Family Medicine

## 2023-06-19 DIAGNOSIS — K219 Gastro-esophageal reflux disease without esophagitis: Secondary | ICD-10-CM

## 2023-06-19 MED ORDER — DEXLANSOPRAZOLE 60 MG PO CPDR: 1.0000 | DELAYED_RELEASE_CAPSULE | Freq: Every day | ORAL | 1 refills | Status: DC

## 2023-06-19 NOTE — Telephone Encounter (Signed)
 [facility] pharmacy is requesting refill dexlansoprazole (DEXILANT) 60 MG capsule   Please advise

## 2023-06-22 ENCOUNTER — Ambulatory Visit: Payer: Self-pay | Admitting: Internal Medicine

## 2023-06-22 ENCOUNTER — Encounter: Payer: Self-pay | Admitting: Internal Medicine

## 2023-06-22 VITALS — BP 120/70 | HR 83 | Temp 98.7°F | Resp 16 | Ht 72.0 in | Wt 187.0 lb

## 2023-06-22 DIAGNOSIS — J479 Bronchiectasis, uncomplicated: Secondary | ICD-10-CM

## 2023-06-22 DIAGNOSIS — G4733 Obstructive sleep apnea (adult) (pediatric): Secondary | ICD-10-CM | POA: Diagnosis not present

## 2023-06-22 DIAGNOSIS — J4550 Severe persistent asthma, uncomplicated: Secondary | ICD-10-CM

## 2023-06-22 NOTE — Progress Notes (Signed)
 [facility] [address] [place], [place] [address]  Pulmonary Sleep Medicine   Office Visit Note  Patient Name: [name] [name] DOB: 1955/04/10 MRN [id number]  Date of Service: 06/22/2023  Complaints/HPI: He states his machine is making noises and appears to be failing.  Patient states that he is not getting as much benefit from it and he needs to now have the machine checked out.  The machine is basically beyond its natural life span in probably needs to be replaced this time.  We spoke at length about how we can go about doing this.  I am going to submit a DME request and see if his insurance will approve based on his previous sleep studies.  He does not want to have to go through another sleep study.  Office Spirometry Results:     ROS  General: (-) fever, (-) chills, (-) night sweats, (-) weakness Skin: (-) rashes, (-) itching,. Eyes: (-) visual changes, (-) redness, (-) itching. Nose and Sinuses: (-) nasal stuffiness or itchiness, (-) postnasal drip, (-) nosebleeds, (-) sinus trouble. Mouth and Throat: (-) sore throat, (-) hoarseness. Neck: (-) swollen glands, (-) enlarged thyroid, (-) neck pain. Respiratory: - cough, (-) bloody sputum, - shortness of breath, - wheezing. Cardiovascular: - ankle swelling, (-) chest pain. Lymphatic: (-) lymph node enlargement. Neurologic: (-) numbness, (-) tingling. Psychiatric: (-) anxiety, (-) depression   Current Medication: Outpatient Encounter Medications as of 06/22/2023  Medication Sig   albuterol  (PROVENTIL ) (2.5 MG/3ML) 0.083% nebulizer solution Take 3 mLs (2.5 mg total) by nebulization in the morning and at bedtime. (Patient taking differently: Take 2.5 mg by nebulization as needed.)   albuterol  (VENTOLIN  HFA) 108 (90 Base) MCG/ACT inhaler Inhale 1-2 puffs into the lungs every 6 (six) hours as needed for wheezing or shortness of breath.   benzonatate  (TESSALON  PERLES) 100 MG capsule Take 2 capsules (200 mg  total) by mouth 3 (three) times daily as needed.   dexlansoprazole  (DEXILANT ) 60 MG capsule Take 1 capsule (60 mg total) by mouth daily.   dutasteride  (AVODART ) 0.5 MG capsule TAKE ONE CAPSULE BY MOUTH ONE TIME DAILY   famotidine  (PEPCID ) 40 MG tablet TAKE ONE TABLET BY MOUTH DAILY (Patient taking differently: Take 40 mg by mouth at bedtime.)   fluticasone -salmeterol (ADVAIR) 250-50 MCG/ACT AEPB Inhale 1 puff into the lungs in the morning and at bedtime.   ketotifen (ZADITOR) 0.025 % ophthalmic solution Place 1 drop into both eyes daily.    losartan  (COZAAR ) 100 MG tablet Take 1 tablet (100 mg total) by mouth daily.   promethazine -dextromethorphan (PROMETHAZINE -DM) 6.25-15 MG/5ML syrup Take 5 mLs by mouth 4 (four) times daily as needed for cough.   sodium chloride  HYPERTONIC 3 % nebulizer solution Take by nebulization in the morning and at bedtime.   tadalafil  (CIALIS ) 5 MG tablet TAKE ONE TABLET BY MOUTH AT BEDTIME   tamsulosin  (FLOMAX ) 0.4 MG CAPS capsule TAKE ONE CAPSULE BY MOUTH ONE TIME DAILY   Testosterone  1.62 % GEL APPLY ONE PUMP TO EACH SHOULDER ONE TIME DAILY   verapamil  (CALAN -SR) 240 MG CR tablet Take 1 tablet (240 mg total) by mouth 2 (two) times daily.   No facility-administered encounter medications on file as of 06/22/2023.    Surgical History: Past Surgical History:  Procedure Laterality Date   APPENDECTOMY  1990   Dr Marquita Situ   COLONOSCOPY  2008   Dr Marquita Situ   COLONOSCOPY WITH PROPOFOL  N/A 08/13/2016   Procedure: COLONOSCOPY WITH PROPOFOL ;  Surgeon: Marquita Situ,  Magali Schmitz, MD;  Location: [facility] ENDOSCOPY;  Service: Endoscopy;  Laterality: N/A;   FRACTURE SURGERY  April 2023   Left Ankle   HEMORRHOID SURGERY     HERNIA REPAIR  01/20/2014   umbilical hernia   JOINT REPLACEMENT  Feb 2021   Left knee replacement   KNEE ARTHROSCOPY Left    KNEE SURGERY Left 1986   NASAL SEPTUM SURGERY     TONSILLECTOMY     TOTAL KNEE ARTHROPLASTY Left 03/29/2019   Procedure: TOTAL KNEE  ARTHROPLASTY;  Surgeon: Elner Hahn, MD;  Location: [facility] ORS;  Service: Orthopedics;  Laterality: Left;   VASECTOMY      Medical History: Past Medical History:  Diagnosis Date   Allergy    Annual physical exam 01/19/2023   Arthritis    Knees & hands   Asthma    BP (high blood pressure) 11/07/2014   Should stop combination of Tekturna (DRI)  and ARB    Cataract 2018   Closed fracture of lateral malleolus of left fibula 07/23/2020   GERD (gastroesophageal reflux disease)    Hypertension    HYPERTENSION, SEVERE 03/15/2007   Qualifier: Diagnosis of  By: Cala Castleman     Prostate enlargement    Sleep apnea    Status post total knee replacement using cement, left 04/01/2019   Stress fracture of tibia and fibula 07/18/2020    Family History: Family History  Problem Relation Age of Onset   Cancer Mother        ovarian   Glaucoma Mother    Cancer Father        prostate   Hypertension Sister    Asthma Sister    Alcohol abuse Paternal Uncle    Liver disease Paternal Uncle    Cancer - Prostate Paternal Uncle    Alcohol abuse Paternal Uncle    Cancer Maternal Uncle        lung; two mat uncles   Cancer Paternal Grandmother        breast   Cancer Paternal Aunt        lung cancer in a smoker   Arthritis Maternal Grandfather    Arthritis Maternal Grandmother    ADD / ADHD Son    Anxiety disorder Son    Cancer Maternal Aunt    Cancer Maternal Uncle     Social History: Social History   Socioeconomic History   Marital status: Married    Spouse name: Not on file   Number of children: Not on file   Years of education: Not on file   Highest education level: Master's degree (e.g., MA, MS, MEng, MEd, MSW, MBA)  Occupational History   Not on file  Tobacco Use   Smoking status: Never   Smokeless tobacco: Never   Tobacco comments:    2nd hand smoke growing up  Vaping Use   Vaping status: Never Used  Substance and Sexual Activity   Alcohol use: Not Currently     Comment: occasionally   Drug use: Never   Sexual activity: Yes    Birth control/protection: Surgical  Other Topics Concern   Not on file  Social History Narrative   Not on file   Social Drivers of Health   Financial Resource Strain: Low Risk  (05/05/2023)   Overall Financial Resource Strain (CARDIA)    Difficulty of Paying Living Expenses: Not hard at all  Food Insecurity: No Food Insecurity (05/05/2023)   Hunger Vital Sign    Worried About Running Out  of Food in the Last Year: Never true    Ran Out of Food in the Last Year: Never true  Transportation Needs: No Transportation Needs (05/05/2023)   PRAPARE - Administrator, Civil Service (Medical): No    Lack of Transportation (Non-Medical): No  Physical Activity: Insufficiently Active (05/05/2023)   Exercise Vital Sign    Days of Exercise per Week: 3 days    Minutes of Exercise per Session: 20 min  Stress: No Stress Concern Present (05/05/2023)   [employer] of Occupational Health - Occupational Stress Questionnaire    Feeling of Stress : Not at all  Social Connections: Socially Integrated (05/05/2023)   Social Connection and Isolation Panel [NHANES]    Frequency of Communication with Friends and Family: Twice a week    Frequency of Social Gatherings with Friends and Family: Once a week    Attends Religious Services: More than 4 times per year    Active Member of [employer] or Organizations: Yes    Attends Engineer, structural: More than 4 times per year    Marital Status: Married  Catering manager Violence: Not At Risk (09/24/2022)   Humiliation, Afraid, Rape, and Kick questionnaire    Fear of Current or Ex-Partner: No    Emotionally Abused: No    Physically Abused: No    Sexually Abused: No    Vital Signs: Blood pressure 120/70, pulse 83, temperature 98.7 F (37.1 C), resp. rate 16, height 6' (1.829 m), weight 187 lb (84.8 kg), SpO2 98%.  Examination: General Appearance: The patient is well-developed,  well-nourished, and in no distress. Skin: Gross inspection of skin unremarkable. Head: normocephalic, no gross deformities. Eyes: no gross deformities noted. ENT: ears appear grossly normal no exudates. Neck: Supple. No thyromegaly. No LAD. Respiratory: no rhonchi noted. Cardiovascular: Normal S1 and S2 without murmur or rub. Extremities: No cyanosis. pulses are equal. Neurologic: Alert and oriented. No involuntary movements.  LABS: Recent Results (from the past 2160 hours)  Testosterone      Status: None   Collection Time: 04/06/23 10:12 AM  Result Value Ref Range   Testosterone  604 264 - 916 ng/dL    Comment: Adult male reference interval is based on a population of healthy nonobese males (BMI <30) between 37 and 75 years old. Travison, et.al. JCEM [phone]. PMID: [id number].   Hemoglobin and hematocrit, blood     Status: None   Collection Time: 04/06/23 10:12 AM  Result Value Ref Range   Hemoglobin 13.6 13.0 - 17.7 g/dL   Hematocrit 84.6 96.2 - 51.0 %  POC Covid19/Flu A&B Antigen     Status: None   Collection Time: 05/05/23 11:17 AM  Result Value Ref Range   Influenza A Antigen, POC Negative Negative   Influenza B Antigen, POC Negative Negative   Covid Antigen, POC Negative Negative    Radiology: DG Chest 2 View Result Date: 05/05/2023 CLINICAL DATA:  Productive cough and fever. EXAM: CHEST - 2 VIEW COMPARISON:  Chest x-ray dated May 07, 2022. FINDINGS: The heart size and mediastinal contours are within normal limits. Normal pulmonary vascularity. Patchy opacities within an inferior lower lobe on the lateral view, felt to be most likely the right lower lobe. No pleural effusion or pneumothorax. No acute osseous abnormality. IMPRESSION: 1. Lower lobe pneumonia, likely right-sided. Electronically Signed   By: Aleta Anda M.D.   On: 05/05/2023 12:48    No results found.  No results found.  Assessment and Plan: Patient Active Problem List  Diagnosis Date  Noted   Pancreatic cyst 03/26/2023   Acute right hip pain 03/26/2023   Annual physical exam 01/19/2023   Encounter for annual wellness visit (AWV) in Medicare patient 01/19/2023   Subcutaneous nodule of right upper extremity 09/19/2022   Moderate persistent asthma with exacerbation 06/24/2022   Syncope and collapse 06/18/2022   Hiatal hernia 03/20/2022   Refractory chronic cough 01/02/2022   Essential hypertension 12/19/2021   Severe persistent asthma 09/26/2021   Osteopenia 07/23/2020   Vitamin D  deficiency 07/23/2020   Erectile dysfunction due to arterial insufficiency 07/15/2018   Primary osteoarthritis of left knee 12/10/2015   Allergic rhinitis 11/07/2014   Benign prostatic hyperplasia with lower urinary tract symptoms 11/07/2014   Hypogonadism in male 11/07/2014   Obstructive apnea 11/07/2014   Plantar fasciitis 11/07/2014   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 OSA (obstructive sleep apnea) (Primary)   Will go ahead and request a new CPAP machine as his current machine is failing.  The patient has confirmed OSA from previous sleep studies.  He will still call 5. - For home use only DME continuous positive airway pressure (CPAP)  2. Bronchiectasis without complication (HCC)  No flare ups will continue to monitor along closely.  3. Severe persistent asthma, unspecified whether complicated   This has been under control  patient is on albuterol  as well as Advair now has good control  General Counseling: I have discussed the findings of the evaluation and examination with Veryl Gottron.  I have also discussed any further diagnostic evaluation thatmay be needed or ordered today. Nyal verbalizes understanding of the findings of todays visit. We also reviewed his medications today and discussed drug interactions and side effects including but not limited excessive drowsiness and altered  mental states. We also discussed that there is always a risk not just to him but also people around him. he has been encouraged to call the office with any questions or concerns that should arise related to todays visit.  No orders of the defined types were placed in this encounter.    Time spent: 5  I have personally obtained a history, examined the patient, evaluated laboratory and imaging results, formulated the assessment and plan and placed orders.    Cordie Deters, MD [facility] Pulmonary and Critical Care Sleep medicine

## 2023-06-22 NOTE — Patient Instructions (Addendum)

## 2023-06-29 ENCOUNTER — Telehealth: Payer: Self-pay | Admitting: Family Medicine

## 2023-06-29 DIAGNOSIS — I10 Essential (primary) hypertension: Secondary | ICD-10-CM

## 2023-06-29 MED ORDER — LOSARTAN POTASSIUM 100 MG PO TABS: 100.0000 mg | ORAL_TABLET | Freq: Every day | ORAL | 1 refills | Status: DC

## 2023-06-29 NOTE — Telephone Encounter (Signed)
[facility] faxed refill request for the following medications:   losartan (COZAAR) 100 MG tablet    Please advise.

## 2023-06-29 NOTE — Telephone Encounter (Signed)
Medication has been sent to the pharmacy on file

## 2023-07-21 DIAGNOSIS — M25571 Pain in right ankle and joints of right foot: Secondary | ICD-10-CM | POA: Diagnosis not present

## 2023-07-21 DIAGNOSIS — M25471 Effusion, right ankle: Secondary | ICD-10-CM | POA: Diagnosis not present

## 2023-07-21 DIAGNOSIS — M76821 Posterior tibial tendinitis, right leg: Secondary | ICD-10-CM | POA: Diagnosis not present

## 2023-08-13 DIAGNOSIS — M76821 Posterior tibial tendinitis, right leg: Secondary | ICD-10-CM | POA: Diagnosis not present

## 2023-08-13 DIAGNOSIS — M2141 Flat foot [pes planus] (acquired), right foot: Secondary | ICD-10-CM | POA: Diagnosis not present

## 2023-08-19 DIAGNOSIS — L570 Actinic keratosis: Secondary | ICD-10-CM | POA: Diagnosis not present

## 2023-08-20 ENCOUNTER — Encounter: Payer: Self-pay | Admitting: Family Medicine

## 2023-08-20 ENCOUNTER — Ambulatory Visit (INDEPENDENT_AMBULATORY_CARE_PROVIDER_SITE_OTHER): Payer: Self-pay | Admitting: Family Medicine

## 2023-08-20 VITALS — BP 152/70 | HR 83 | Ht 72.0 in | Wt 195.0 lb

## 2023-08-20 DIAGNOSIS — N401 Enlarged prostate with lower urinary tract symptoms: Secondary | ICD-10-CM

## 2023-08-20 DIAGNOSIS — Z131 Encounter for screening for diabetes mellitus: Secondary | ICD-10-CM

## 2023-08-20 DIAGNOSIS — K219 Gastro-esophageal reflux disease without esophagitis: Secondary | ICD-10-CM | POA: Diagnosis not present

## 2023-08-20 DIAGNOSIS — E559 Vitamin D deficiency, unspecified: Secondary | ICD-10-CM

## 2023-08-20 DIAGNOSIS — I10 Essential (primary) hypertension: Secondary | ICD-10-CM

## 2023-08-20 DIAGNOSIS — J301 Allergic rhinitis due to pollen: Secondary | ICD-10-CM

## 2023-08-20 DIAGNOSIS — J4550 Severe persistent asthma, uncomplicated: Secondary | ICD-10-CM | POA: Diagnosis not present

## 2023-08-20 DIAGNOSIS — R350 Frequency of micturition: Secondary | ICD-10-CM | POA: Diagnosis not present

## 2023-08-20 DIAGNOSIS — G4733 Obstructive sleep apnea (adult) (pediatric): Secondary | ICD-10-CM | POA: Diagnosis not present

## 2023-08-20 DIAGNOSIS — E785 Hyperlipidemia, unspecified: Secondary | ICD-10-CM

## 2023-08-20 NOTE — Progress Notes (Signed)
 Established patient visit   Patient: [name] [name]   DOB: 1955/09/19   68 y.o. Male  MRN: [id number] Visit Date: 08/20/2023  Today's healthcare provider: Mimi Alt, MD   Chief Complaint  Patient presents with   Hip Pain    R hip pain is better, no concerns    Subjective     HPI     Hip Pain    Additional comments: R hip pain is better, no concerns       Last edited by Bart Lieu, CMA on 08/20/2023  9:15 AM.       History of Present Illness  Discussed the use of AI scribe software for clinical note transcription with the patient, who gave verbal consent to proceed.  History of Present Illness   [name] [name] is a 68 year old male with hypertension and severe asthma who presents for a chronic follow-up.  He has experienced improved right hip pain following therapy and consultations with orthopedics. The pain was initially due to muscle strain from overconfidence in physical activity, and he currently has no concerns regarding his hip.  He is experiencing issues with his right ankle, where he previously had a plate and rod inserted due to a stress fracture and collapsed arch. He describes his foot as 'really flat' and is currently under the care of an ankle and foot specialist. He is uncertain if further surgery will be required and recalls needing an MRI before his last surgery.  His home blood pressure cuff is broken, preventing regular monitoring. He mentions a recent weight gain after a cruise to [place] and [place], which may be contributing to his elevated blood pressure. He has a history of central hypertension and is currently on losartan  100 mg and verapamil  240 mg twice daily.  He has a history of GERD, benign prostatic hyperplasia with urinary frequency, vitamin D  deficiency, severe asthma, obstructive sleep apnea, and seasonal allergic rhinitis. His GERD has improved following hiatal hernia surgery, and he has not  used his albuterol  inhaler recently, indicating well-controlled asthma. He is working with pulmonology to obtain new equipment for his sleep apnea.  His current medications include losartan  100 mg, verapamil  240 mg twice daily, Avodart  0.5 mg, Cialis  5 mg, Flomax  0.4 mg, testosterone  gel, and Pepcid  40 mg as needed. He has discontinued Ludent due to insurance issues and reports no change in symptoms after stopping it.  He is concerned about his calcium levels due to a history of osteopenia and recalls a previous low calcium result when he was ill. He is interested in annual blood work to monitor his calcium and vitamin D  levels.         Past Medical History:  Diagnosis Date   Allergy    Annual physical exam 01/19/2023   Arthritis    Knees & hands   Asthma    BP (high blood pressure) 11/07/2014   Should stop combination of Tekturna (DRI)  and ARB    Cataract 2018   Closed fracture of lateral malleolus of left fibula 07/23/2020   GERD (gastroesophageal reflux disease)    Hypertension    HYPERTENSION, SEVERE 03/15/2007   Qualifier: Diagnosis of  By: Cala Castleman     Prostate enlargement    Sleep apnea    Status post total knee replacement using cement, left 04/01/2019   Stress fracture of tibia and fibula 07/18/2020    Medications: Outpatient Medications Prior to Visit  Medication Sig  albuterol  (PROVENTIL ) (2.5 MG/3ML) 0.083% nebulizer solution Take 3 mLs (2.5 mg total) by nebulization in the morning and at bedtime. (Patient taking differently: Take 2.5 mg by nebulization as needed.)   albuterol  (VENTOLIN  HFA) 108 (90 Base) MCG/ACT inhaler Inhale 1-2 puffs into the lungs every 6 (six) hours as needed for wheezing or shortness of breath.   dutasteride  (AVODART ) 0.5 MG capsule TAKE ONE CAPSULE BY MOUTH ONE TIME DAILY   famotidine  (PEPCID ) 40 MG tablet TAKE ONE TABLET BY MOUTH DAILY (Patient taking differently: Take 40 mg by mouth at bedtime.)   fluticasone -salmeterol (ADVAIR)  250-50 MCG/ACT AEPB Inhale 1 puff into the lungs in the morning and at bedtime.   ketotifen (ZADITOR) 0.025 % ophthalmic solution Place 1 drop into both eyes daily.    losartan  (COZAAR ) 100 MG tablet Take 1 tablet (100 mg total) by mouth daily.   tadalafil  (CIALIS ) 5 MG tablet TAKE ONE TABLET BY MOUTH AT BEDTIME   tamsulosin  (FLOMAX ) 0.4 MG CAPS capsule TAKE ONE CAPSULE BY MOUTH ONE TIME DAILY   Testosterone  1.62 % GEL APPLY ONE PUMP TO EACH SHOULDER ONE TIME DAILY   verapamil  (CALAN -SR) 240 MG CR tablet Take 1 tablet (240 mg total) by mouth 2 (two) times daily.   [DISCONTINUED] benzonatate  (TESSALON  PERLES) 100 MG capsule Take 2 capsules (200 mg total) by mouth 3 (three) times daily as needed.   [DISCONTINUED] dexlansoprazole  (DEXILANT ) 60 MG capsule Take 1 capsule (60 mg total) by mouth daily.   [DISCONTINUED] promethazine -dextromethorphan (PROMETHAZINE -DM) 6.25-15 MG/5ML syrup Take 5 mLs by mouth 4 (four) times daily as needed for cough.   [DISCONTINUED] sodium chloride  HYPERTONIC 3 % nebulizer solution Take by nebulization in the morning and at bedtime.   No facility-administered medications prior to visit.    Review of Systems  Last metabolic panel Lab Results  Component Value Date   GLUCOSE 94 03/06/2023   NA 136 03/06/2023   K 3.2 (L) 03/06/2023   CL 100 03/06/2023   CO2 27 03/06/2023   BUN 10 03/06/2023   CREATININE 0.76 03/06/2023   GFRNONAA >60 03/06/2023   CALCIUM 8.4 (L) 03/06/2023   PHOS 2.8 07/26/2020   PROT 6.3 (L) 03/06/2023   ALBUMIN 3.9 03/06/2023   LABGLOB 1.7 01/19/2023   AGRATIO 1.6 02/26/2022   BILITOT 0.8 03/06/2023   ALKPHOS 59 03/06/2023   AST 20 03/06/2023   ALT 15 03/06/2023   ANIONGAP 9 03/06/2023   Last lipids Lab Results  Component Value Date   CHOL 187 01/19/2023   HDL 57 01/19/2023   LDLCALC 111 (H) 01/19/2023   TRIG 108 01/19/2023   CHOLHDL 3.3 01/19/2023   The 10-year ASCVD risk score (Arnett DK, et al., 2019) is: 20.3%  Last  hemoglobin A1c Lab Results  Component Value Date   HGBA1C 5.4 01/19/2023   Last thyroid functions Lab Results  Component Value Date   TSH 0.844 02/26/2022        Objective    BP (!) 152/70   Pulse 83   Ht 6' (1.829 m)   Wt 195 lb (88.5 kg)   SpO2 100%   BMI 26.45 kg/m  BP Readings from Last 3 Encounters:  08/20/23 (!) 152/70  06/22/23 120/70  05/05/23 126/60   Wt Readings from Last 3 Encounters:  08/20/23 195 lb (88.5 kg)  06/22/23 187 lb (84.8 kg)  05/05/23 187 lb (84.8 kg)        Physical Exam Vitals reviewed.  Constitutional:      General:  He is not in acute distress.    Appearance: Normal appearance. He is not ill-appearing.   Cardiovascular:     Rate and Rhythm: Normal rate and regular rhythm.  Pulmonary:     Effort: Pulmonary effort is normal. No respiratory distress.     Breath sounds: No wheezing, rhonchi or rales.   Musculoskeletal:     Comments: Right ankle in brace    Neurological:     Mental Status: He is alert and oriented to person, place, and time.   Psychiatric:        Mood and Affect: Mood normal.        Behavior: Behavior normal.      No results found for any visits on 08/20/23.  Assessment & Plan     Problem List Items Addressed This Visit       Cardiovascular and Mediastinum   Essential hypertension - Primary   Relevant Orders   CMP14+EGFR     Respiratory   Severe persistent asthma   Obstructive apnea   Allergic rhinitis     Digestive   Esophageal reflux   Relevant Orders   CBC     Genitourinary   Benign prostatic hyperplasia with lower urinary tract symptoms     Other   Vitamin D  deficiency   Relevant Orders   VITAMIN D  25 Hydroxy (Vit-D Deficiency, Fractures)   Other Visit Diagnoses       Hyperlipidemia LDL goal <70       Relevant Orders   Lipid panel     Screening for diabetes mellitus       Relevant Orders   Hemoglobin A1c        Assessment & Plan    Right Ankle Pain and Deformity Right  ankle issues with a plate and rod insertion due to stress fracture and arch collapse. Currently experiencing arch and ankle problems, referred to an ankle and foot specialist. Awaiting further evaluation and potential surgical intervention. Previous MRI and immediate surgery were required for similar issues. - Follow up with ankle and foot specialist for further evaluation and potential surgery.  Hypertension Blood pressure is elevated at 152/70. Managed with losartan  100 mg and verapamil  240 mg twice daily. Recent weight gain and reduced physical activity may contribute to elevated readings. Meloxicam may also affect blood pressure control. Previous use of hydrochlorothiazide  was discontinued due to hypotension and fainting. Current goal is to maintain blood pressure in the 120s/70s, with 130s/70s being acceptable. Avoiding additional medication if possible to prevent complications. - Monitor blood pressure at home with a new cuff. - Schedule a virtual visit in one month to reassess blood pressure. - Consider reintroducing hydrochlorothiazide  12.5 mg if blood pressure remains elevated. - Evaluate impact of meloxicam on blood pressure.  Asthma Asthma is well-controlled. Has not used albuterol  inhaler recently. Continues to follow up with pulmonology and is on Advair (fluticasone /salmeterol).  Benign Prostatic Hyperplasia (BPH) On Avodart  0.5 mg and Flomax  0.4 mg for BPH management. No current issues reported.  Gastroesophageal Reflux Disease (GERD) GERD symptoms have improved following hiatal hernia surgery. Taking Pepcid  40 mg as needed.  General Health Maintenance Due for annual blood work. Concern about calcium levels due to osteopenia. Vitamin D  deficiency noted in history. Recent breakfast may affect cholesterol levels if tested today. - Order annual blood work including calcium and vitamin D  levels. - Return for fasting blood work for accurate cholesterol levels.         Return in  about 1 month (around  09/19/2023) for HTN.         Mimi Alt, MD  [facility] [phone] (phone) [phone] (fax)  [facility]

## 2023-08-24 DIAGNOSIS — M76821 Posterior tibial tendinitis, right leg: Secondary | ICD-10-CM | POA: Diagnosis not present

## 2023-08-24 DIAGNOSIS — M2141 Flat foot [pes planus] (acquired), right foot: Secondary | ICD-10-CM | POA: Diagnosis not present

## 2023-08-24 DIAGNOSIS — M13872 Other specified arthritis, left ankle and foot: Secondary | ICD-10-CM | POA: Diagnosis not present

## 2023-08-24 DIAGNOSIS — M13871 Other specified arthritis, right ankle and foot: Secondary | ICD-10-CM | POA: Diagnosis not present

## 2023-08-25 DIAGNOSIS — Z131 Encounter for screening for diabetes mellitus: Secondary | ICD-10-CM | POA: Diagnosis not present

## 2023-08-25 DIAGNOSIS — I10 Essential (primary) hypertension: Secondary | ICD-10-CM | POA: Diagnosis not present

## 2023-08-25 DIAGNOSIS — E559 Vitamin D deficiency, unspecified: Secondary | ICD-10-CM | POA: Diagnosis not present

## 2023-08-25 DIAGNOSIS — E785 Hyperlipidemia, unspecified: Secondary | ICD-10-CM | POA: Diagnosis not present

## 2023-08-25 DIAGNOSIS — K219 Gastro-esophageal reflux disease without esophagitis: Secondary | ICD-10-CM | POA: Diagnosis not present

## 2023-08-26 ENCOUNTER — Ambulatory Visit: Payer: Self-pay | Admitting: Family Medicine

## 2023-08-26 LAB — LIPID PANEL
Chol/HDL Ratio: 2.7 ratio (ref 0.0–5.0)
Cholesterol, Total: 162 mg/dL (ref 100–199)
HDL: 59 mg/dL (ref >39)
LDL Chol Calc (NIH): 91 mg/dL (ref 0–99)
Triglycerides: 62 mg/dL (ref 0–149)
VLDL Cholesterol Cal: 12 mg/dL (ref 5–40)

## 2023-08-26 LAB — CBC
Hematocrit: 41.9 % (ref 37.5–51.0)
Hemoglobin: 14 g/dL (ref 13.0–17.7)
MCH: 32.7 pg (ref 26.6–33.0)
MCHC: 33.4 g/dL (ref 31.5–35.7)
MCV: 98 fL — ABNORMAL HIGH (ref 79–97)
Platelets: 184 10*3/uL (ref 150–450)
RBC: 4.28 x10E6/uL (ref 4.14–5.80)
RDW: 12.9 % (ref 11.6–15.4)
WBC: 4.8 10*3/uL (ref 3.4–10.8)

## 2023-08-26 LAB — CMP14+EGFR
ALT: 15 IU/L (ref 0–44)
AST: 18 IU/L (ref 0–40)
Albumin: 4.2 g/dL (ref 3.9–4.9)
Alkaline Phosphatase: 80 IU/L (ref 44–121)
BUN/Creatinine Ratio: 14 (ref 10–24)
BUN: 15 mg/dL (ref 8–27)
Bilirubin Total: 0.7 mg/dL (ref 0.0–1.2)
CO2: 25 mmol/L (ref 20–29)
Calcium: 9 mg/dL (ref 8.6–10.2)
Chloride: 102 mmol/L (ref 96–106)
Creatinine, Ser: 1.05 mg/dL (ref 0.76–1.27)
Globulin, Total: 1.7 g/dL (ref 1.5–4.5)
Glucose: 91 mg/dL (ref 70–99)
Potassium: 4.1 mmol/L (ref 3.5–5.2)
Sodium: 141 mmol/L (ref 134–144)
Total Protein: 5.9 g/dL — ABNORMAL LOW (ref 6.0–8.5)
eGFR: 78 mL/min/{1.73_m2} (ref >59)

## 2023-08-26 LAB — VITAMIN D 25 HYDROXY (VIT D DEFICIENCY, FRACTURES): Vit D, 25-Hydroxy: 66.9 ng/mL (ref 30.0–100.0)

## 2023-08-26 LAB — HEMOGLOBIN A1C
Est. average glucose Bld gHb Est-mCnc: 97 mg/dL
Hgb A1c MFr Bld: 5 % (ref 4.8–5.6)

## 2023-09-25 ENCOUNTER — Other Ambulatory Visit: Payer: HMO

## 2023-09-25 DIAGNOSIS — N5201 Erectile dysfunction due to arterial insufficiency: Secondary | ICD-10-CM

## 2023-09-25 DIAGNOSIS — N401 Enlarged prostate with lower urinary tract symptoms: Secondary | ICD-10-CM | POA: Diagnosis not present

## 2023-09-25 DIAGNOSIS — H353121 Nonexudative age-related macular degeneration, left eye, early dry stage: Secondary | ICD-10-CM | POA: Diagnosis not present

## 2023-09-25 DIAGNOSIS — E291 Testicular hypofunction: Secondary | ICD-10-CM | POA: Diagnosis not present

## 2023-09-25 DIAGNOSIS — H524 Presbyopia: Secondary | ICD-10-CM | POA: Diagnosis not present

## 2023-09-26 ENCOUNTER — Other Ambulatory Visit: Payer: Self-pay | Admitting: Urology

## 2023-09-26 DIAGNOSIS — N401 Enlarged prostate with lower urinary tract symptoms: Secondary | ICD-10-CM

## 2023-09-26 LAB — PSA: Prostate Specific Ag, Serum: 0.4 ng/mL (ref 0.0–4.0)

## 2023-09-26 LAB — TESTOSTERONE: Testosterone: 934 ng/dL — ABNORMAL HIGH (ref 264–916)

## 2023-09-26 LAB — HEMOGLOBIN AND HEMATOCRIT, BLOOD
Hematocrit: 42.2 % (ref 37.5–51.0)
Hemoglobin: 13.9 g/dL (ref 13.0–17.7)

## 2023-09-28 ENCOUNTER — Telehealth: Admitting: Family Medicine

## 2023-09-28 ENCOUNTER — Other Ambulatory Visit: Payer: Self-pay

## 2023-09-29 ENCOUNTER — Other Ambulatory Visit: Payer: Self-pay

## 2023-09-29 ENCOUNTER — Ambulatory Visit: Payer: HMO

## 2023-09-29 ENCOUNTER — Telehealth: Payer: Self-pay | Admitting: Family Medicine

## 2023-09-29 DIAGNOSIS — Z0000 Encounter for general adult medical examination without abnormal findings: Secondary | ICD-10-CM

## 2023-09-29 DIAGNOSIS — I10 Essential (primary) hypertension: Secondary | ICD-10-CM

## 2023-09-29 MED ORDER — VERAPAMIL HCL ER 240 MG PO TBCR: 240.0000 mg | EXTENDED_RELEASE_TABLET | Freq: Two times a day (BID) | ORAL | 1 refills | Status: DC

## 2023-09-29 NOTE — Patient Instructions (Addendum)
 Mr. [name] [name] , Thank you for taking time out of your busy schedule to complete your Annual Wellness Visit with me. I enjoyed our conversation and look forward to speaking with you again next year. I, as well as your care team,  appreciate your ongoing commitment to your health goals. Please review the following plan we discussed and let me know if I can assist you in the future.   Follow up Visits: Next Medicare AWV with our clinical staff:   PT DECLINED YEARLY VISIT Have you seen your provider in the last 6 months (3 months if uncontrolled diabetes)? Yes   Clinician Recommendations:  Aim for 30 minutes of exercise or brisk walking, 6-8 glasses of water, and 5 servings of fruits and vegetables each day. TAKE CARE!      This is a list of the screening recommended for you and due dates:  Health Maintenance  Topic Date Due   Zoster (Shingles) Vaccine (1 of 2) Never done   COVID-19 Vaccine (7 - 2024-25 season) 05/21/2023   Flu Shot  10/09/2023   DTaP/Tdap/Td vaccine (3 - Td or Tdap) 10/12/2023   Medicare Annual Wellness Visit  09/28/2024   Colon Cancer Screening  08/14/2026   Pneumococcal Vaccine for age over 17  Completed   Hepatitis C Screening  Completed   Hepatitis B Vaccine  Aged Out   HPV Vaccine  Aged Out   Meningitis B Vaccine  Aged Out    Advanced directives: (ACP Link)Information on Advanced Care Planning can be found at Dietitian Health Care Directives Advance Health Care Directives. [email or web address]  Advance Care Planning is important because it:  [x]  Makes sure you receive the medical care that is consistent with your values, goals, and preferences  [x]  It provides guidance to your family and loved ones and reduces their decisional burden about whether or not they are making the right decisions based on your wishes.  Follow the link provided in your after visit summary or read over the paperwork we have mailed to you to help you started getting  your Advance Directives in place. If you need assistance in completing these, please reach out to us  so that we can help you!

## 2023-09-29 NOTE — Telephone Encounter (Signed)
 Medication sent in.

## 2023-09-29 NOTE — Telephone Encounter (Signed)
[facility] faxed refill request for the following medications:  verapamil (CALAN-SR) 240 MG CR tablet   Please advise.

## 2023-09-29 NOTE — Progress Notes (Signed)
 Subjective:   [name] [name] is a 68 y.o. who presents for a Medicare Wellness preventive visit.  As a reminder, Annual Wellness Visits don't include a physical exam, and some assessments may be limited, especially if this visit is performed virtually. We may recommend an in-person follow-up visit with your provider if needed.  Visit Complete: Virtual I connected with  [name] [name] on 09/29/23 by a audio enabled telemedicine application and verified that I am speaking with the correct person using two identifiers.  Patient Location: Home  Provider Location: Office/Clinic  I discussed the limitations of evaluation and management by telemedicine. The patient expressed understanding and agreed to proceed.  Vital Signs: Because this visit was a virtual/telehealth visit, some criteria may be missing or patient reported. Any vitals not documented were not able to be obtained and vitals that have been documented are patient reported.  VideoDeclined- This patient declined Librarian, academic. Therefore the visit was completed with audio only.  Persons Participating in Visit: Patient.  AWV Questionnaire: No: Patient Medicare AWV questionnaire was not completed prior to this visit.  Cardiac Risk Factors include: advanced age (>50men, >3 women);hypertension;male gender;sedentary lifestyle     Objective:    Today's Vitals   09/29/23 1514  PainSc: 0-No pain   There is no height or weight on file to calculate BMI.     09/29/2023    3:17 PM 03/06/2023   12:42 PM 11/29/2022   10:22 AM 09/24/2022    2:57 PM 08/29/2022    1:50 PM 01/29/2022    7:08 PM 08/13/2016    7:50 AM  Advanced Directives  Does Patient Have a Medical Advance Directive? No No No No No No No   Would patient like information on creating a medical advance directive? No - Patient declined           Data saved with a previous flowsheet row definition    Current Medications  (verified) Outpatient Encounter Medications as of 09/29/2023  Medication Sig   albuterol  (PROVENTIL ) (2.5 MG/3ML) 0.083% nebulizer solution Take 3 mLs (2.5 mg total) by nebulization in the morning and at bedtime.   albuterol  (VENTOLIN  HFA) 108 (90 Base) MCG/ACT inhaler Inhale 1-2 puffs into the lungs every 6 (six) hours as needed for wheezing or shortness of breath.   dutasteride  (AVODART ) 0.5 MG capsule TAKE ONE CAPSULE BY MOUTH ONE TIME DAILY   famotidine  (PEPCID ) 40 MG tablet TAKE ONE TABLET BY MOUTH DAILY   fluticasone -salmeterol (ADVAIR) 250-50 MCG/ACT AEPB Inhale 1 puff into the lungs in the morning and at bedtime.   ketotifen (ZADITOR) 0.025 % ophthalmic solution Place 1 drop into both eyes daily.    losartan  (COZAAR ) 100 MG tablet Take 1 tablet (100 mg total) by mouth daily.   tadalafil  (CIALIS ) 5 MG tablet TAKE ONE TABLET BY MOUTH AT BEDTIME   tamsulosin  (FLOMAX ) 0.4 MG CAPS capsule TAKE ONE CAPSULE BY MOUTH ONE TIME DAILY   Testosterone  1.62 % GEL APPLY ONE PUMP TO EACH SHOULDER ONE TIME DAILY   verapamil  (CALAN -SR) 240 MG CR tablet Take 1 tablet (240 mg total) by mouth 2 (two) times daily.   No facility-administered encounter medications on file as of 09/29/2023.    Allergies (verified) Hydralazine , Wheat extract, Gluten meal, Moxifloxacin, Pseudoephedrine, Sulfa antibiotics, and Sulfonamide derivatives   History: Past Medical History:  Diagnosis Date   Allergy    Annual physical exam 01/19/2023   Arthritis    Knees & hands   Asthma  BP (high blood pressure) 11/07/2014   Should stop combination of Tekturna (DRI)  and ARB    Cataract 2018   Closed fracture of lateral malleolus of left fibula 07/23/2020   GERD (gastroesophageal reflux disease)    Hypertension    HYPERTENSION, SEVERE 03/15/2007   Qualifier: Diagnosis of  By: Winona Raring     Prostate enlargement    Sleep apnea    Status post total knee replacement using cement, left 04/01/2019   Stress fracture of  tibia and fibula 07/18/2020   Past Surgical History:  Procedure Laterality Date   APPENDECTOMY  1990   Dr Dessa   COLONOSCOPY  2008   Dr Dessa   COLONOSCOPY WITH PROPOFOL  N/A 08/13/2016   Procedure: COLONOSCOPY WITH PROPOFOL ;  Surgeon: Dessa Reyes ORN, MD;  Location: [facility] ENDOSCOPY;  Service: Endoscopy;  Laterality: N/A;   FRACTURE SURGERY  April 2023   Left Ankle   HEMORRHOID SURGERY     HERNIA REPAIR  01/20/2014   umbilical hernia   JOINT REPLACEMENT  Feb 2021   Left knee replacement   KNEE ARTHROSCOPY Left    KNEE SURGERY Left 1986   NASAL SEPTUM SURGERY     TONSILLECTOMY     TOTAL KNEE ARTHROPLASTY Left 03/29/2019   Procedure: TOTAL KNEE ARTHROPLASTY;  Surgeon: Edie Norleen PARAS, MD;  Location: [facility] ORS;  Service: Orthopedics;  Laterality: Left;   VASECTOMY     Family History  Problem Relation Age of Onset   Cancer Mother        ovarian   Glaucoma Mother    Cancer Father        prostate   Hypertension Sister    Asthma Sister    Alcohol abuse Paternal Uncle    Liver disease Paternal Uncle    Cancer - Prostate Paternal Uncle    Alcohol abuse Paternal Uncle    Cancer Maternal Uncle        lung; two mat uncles   Cancer Paternal Grandmother        breast   Cancer Paternal Aunt        lung cancer in a smoker   Arthritis Maternal Grandfather    Arthritis Maternal Grandmother    ADD / ADHD Son    Anxiety disorder Son    Cancer Maternal Aunt    Cancer Maternal Uncle    Social History   Socioeconomic History   Marital status: Married    Spouse name: Not on file   Number of children: Not on file   Years of education: Not on file   Highest education level: Doctorate  Occupational History   Not on file  Tobacco Use   Smoking status: Never   Smokeless tobacco: Never   Tobacco comments:    2nd hand smoke growing up  Vaping Use   Vaping status: Never Used  Substance and Sexual Activity   Alcohol use: Not Currently    Comment: occasionally   Drug use:  Never   Sexual activity: Yes    Birth control/protection: Surgical  Other Topics Concern   Not on file  Social History Narrative   Not on file   Social Drivers of Health   Financial Resource Strain: Low Risk  (09/29/2023)   Overall Financial Resource Strain (CARDIA)    Difficulty of Paying Living Expenses: Not hard at all  Food Insecurity: No Food Insecurity (09/29/2023)   Hunger Vital Sign    Worried About Running Out of Food in the Last Year: Never  true    Ran Out of Food in the Last Year: Never true  Transportation Needs: No Transportation Needs (09/29/2023)   PRAPARE - Administrator, Civil Service (Medical): No    Lack of Transportation (Non-Medical): No  Physical Activity: Insufficiently Active (09/29/2023)   Exercise Vital Sign    Days of Exercise per Week: 2 days    Minutes of Exercise per Session: 20 min  Stress: No Stress Concern Present (09/29/2023)   [employer] of Occupational Health - Occupational Stress Questionnaire    Feeling of Stress: Not at all  Social Connections: Socially Integrated (09/29/2023)   Social Connection and Isolation Panel    Frequency of Communication with Friends and Family: More than three times a week    Frequency of Social Gatherings with Friends and Family: More than three times a week    Attends Religious Services: More than 4 times per year    Active Member of [employer] or Organizations: Yes    Attends Engineer, structural: More than 4 times per year    Marital Status: Married    Tobacco Counseling Counseling given: Not Answered Tobacco comments: 2nd hand smoke growing up    Clinical Intake:  Pre-visit preparation completed: Yes  Pain : No/denies pain Pain Score: 0-No pain     BMI - recorded: 26.4 Nutritional Status: BMI 25 -29 Overweight Nutritional Risks: None Diabetes: No  Lab Results  Component Value Date   HGBA1C 5.0 08/25/2023   HGBA1C 5.4 01/19/2023     How often do you need to have  someone help you when you read instructions, pamphlets, or other written materials from your doctor or pharmacy?: 1 - Never  Interpreter Needed?: No  Information entered by :: JHONNIE DAS, LPN   Activities of Daily Living     09/29/2023    3:19 PM 09/28/2023    2:19 PM  In your present state of health, do you have any difficulty performing the following activities:  Hearing? 0 0  Vision? 0 0  Difficulty concentrating or making decisions? 0 0  Walking or climbing stairs? 0 0  Dressing or bathing? 0 0  Doing errands, shopping? 0 0  Preparing Food and eating ? N N  Using the Toilet? N N  In the past six months, have you accidently leaked urine? N N  Do you have problems with loss of bowel control? N N  Managing your Medications? N N  Managing your Finances? N N  Housekeeping or managing your Housekeeping? N N    Patient Care Team: Sharma Coyer, MD as PCP - General (Family Medicine) Dessa, Reyes ORN, MD (General Surgery) Broadus Bare, OD (Optometry)  I have updated your Care Teams any recent Medical Services you may have received from other providers in the past year.     Assessment:   This is a routine wellness examination for [name] [name].  Hearing/Vision screen Hearing Screening - Comments:: NO AIDS Vision Screening - Comments:: WEARS GLASSES ALL DAY- [facility]- DR.KATSOUDAS- GETS CHECKED ONCE PER YEAR   Goals Addressed             This Visit's Progress    DIET - EAT MORE FRUITS AND VEGETABLES         Depression Screen     09/29/2023    3:16 PM 08/20/2023    9:17 AM 03/26/2023   10:49 AM 09/24/2022    2:55 PM 09/19/2022    8:35 AM 06/24/2022    2:22 PM  06/18/2022    8:34 AM  PHQ 2/9 Scores  PHQ - 2 Score 0 0 0 0 0 0 0  PHQ- 9 Score 0 0 0   0 0    Fall Risk     09/29/2023    3:19 PM 09/28/2023    2:19 PM 03/26/2023   10:50 AM 01/19/2023    2:11 PM 09/22/2022    3:51 PM  Fall Risk   Falls in the past year? 0 0 0 0 0  Number falls  in past yr: 0  0    Injury with Fall? 0  0  0  Risk for fall due to : No Fall Risks    No Fall Risks  Follow up Falls evaluation completed;Falls prevention discussed    Education provided;Falls prevention discussed    MEDICARE RISK AT HOME:  Medicare Risk at Home Any stairs in or around the home?: Yes If so, are there any without handrails?: No Home free of loose throw rugs in walkways, pet beds, electrical cords, etc?: No Adequate lighting in your home to reduce risk of falls?: Yes Life alert?: No Use of a cane, walker or w/c?: No Grab bars in the bathroom?: No Shower chair or bench in shower?: No Elevated toilet seat or a handicapped toilet?: No  TIMED UP AND GO:  Was the test performed?  No  Cognitive Function: 6CIT completed        09/29/2023    3:20 PM 01/19/2023    2:11 PM 09/24/2022    2:58 PM 12/19/2021    8:28 AM  6CIT Screen  What Year? 0 points 0 points 0 points 0 points  What month? 0 points 0 points 0 points 0 points  What time? 0 points 0 points 0 points 0 points  Count back from 20 0 points 0 points 0 points 0 points  Months in reverse 0 points 0 points 0 points 0 points  Repeat phrase 0 points 0 points 0 points 2 points  Total Score 0 points 0 points 0 points 2 points    Immunizations Immunization History  Administered Date(s) Administered   Fluad Trivalent(High Dose 65+) 01/19/2023   H1N1 01/18/2008   Influenza Split 01/12/2010   Influenza,inj,Quad PF,6+ Mos 11/28/2014, 11/11/2018   Influenza-Unspecified 12/01/2016, 12/01/2017, 11/23/2019, 11/22/2020, 11/08/2021, 12/06/2021   MMR 01/20/2018   Moderna SARS-COV2 Booster Vaccination 09/25/2020   Moderna Sars-Covid-2 Vaccination 02/15/2021, 03/26/2023   PFIZER(Purple Top)SARS-COV-2 Vaccination 05/17/2019, 06/07/2019, 12/08/2019   PNEUMOCOCCAL CONJUGATE-20 03/20/2022   Pneumococcal Polysaccharide-23 09/04/1998   Respiratory Syncytial Virus Vaccine,Recomb Aduvanted(Arexvy) 12/06/2021   Td 11/15/2003    Tdap 10/11/2013   Unspecified SARS-COV-2 Vaccination 12/13/2021    Screening Tests Health Maintenance  Topic Date Due   Zoster Vaccines- Shingrix (1 of 2) Never done   COVID-19 Vaccine (7 - 2024-25 season) 05/21/2023   INFLUENZA VACCINE  10/09/2023   DTaP/Tdap/Td (3 - Td or Tdap) 10/12/2023   Medicare Annual Wellness (AWV)  09/28/2024   Colonoscopy  08/14/2026   Pneumococcal Vaccine: 50+ Years  Completed   Hepatitis C Screening  Completed   Hepatitis B Vaccines  Aged Out   HPV VACCINES  Aged Out   Meningococcal B Vaccine  Aged Out    Health Maintenance  Health Maintenance Due  Topic Date Due   Zoster Vaccines- Shingrix (1 of 2) Never done   COVID-19 Vaccine (7 - 2024-25 season) 05/21/2023   Health Maintenance Items Addressed: UP TO DATE ON COLONOSCOPY; UP TO DATE ON SHOTS EXCEPT  TDAP & SHINGRIX  Additional Screening:  Vision Screening: Recommended annual ophthalmology exams for early detection of glaucoma and other disorders of the eye. Would you like a referral to an eye doctor? No    Dental Screening: Recommended annual dental exams for proper oral hygiene  Community Resource Referral / Chronic Care Management: CRR required this visit?  No   CCM required this visit?  No   Plan:    I have personally reviewed and noted the following in the patient's chart:   Medical and social history Use of alcohol, tobacco or illicit drugs  Current medications and supplements including opioid prescriptions. Patient is not currently taking opioid prescriptions. Functional ability and status Nutritional status Physical activity Advanced directives List of other physicians Hospitalizations, surgeries, and ER visits in previous 12 months Vitals Screenings to include cognitive, depression, and falls Referrals and appointments  In addition, I have reviewed and discussed with patient certain preventive protocols, quality metrics, and best practice recommendations. A written  personalized care plan for preventive services as well as general preventive health recommendations were provided to patient.   Jhonnie GORMAN Das, LPN   2/77/7974   After Visit Summary: (MyChart) Due to this being a telephonic visit, the after visit summary with patients personalized plan was offered to patient via MyChart   Notes: Nothing significant to report at this time.

## 2023-10-01 ENCOUNTER — Ambulatory Visit: Admitting: Family Medicine

## 2023-10-02 ENCOUNTER — Ambulatory Visit: Payer: Self-pay | Admitting: Urology

## 2023-10-02 ENCOUNTER — Encounter: Payer: Self-pay | Admitting: Urology

## 2023-10-02 VITALS — BP 116/61 | HR 64 | Ht 72.0 in | Wt 194.0 lb

## 2023-10-02 DIAGNOSIS — E291 Testicular hypofunction: Secondary | ICD-10-CM | POA: Diagnosis not present

## 2023-10-02 DIAGNOSIS — N401 Enlarged prostate with lower urinary tract symptoms: Secondary | ICD-10-CM

## 2023-10-02 DIAGNOSIS — N5201 Erectile dysfunction due to arterial insufficiency: Secondary | ICD-10-CM | POA: Diagnosis not present

## 2023-10-02 MED ORDER — TAMSULOSIN HCL 0.4 MG PO CAPS: 0.4000 mg | ORAL_CAPSULE | Freq: Every day | ORAL | 3 refills | Status: AC

## 2023-10-02 MED ORDER — DUTASTERIDE 0.5 MG PO CAPS: 0.5000 mg | ORAL_CAPSULE | Freq: Every day | ORAL | 3 refills | Status: AC

## 2023-10-02 MED ORDER — TESTOSTERONE 1.62 % TD GEL: TRANSDERMAL | 2 refills | Status: AC

## 2023-10-02 MED ORDER — TADALAFIL 5 MG PO TABS: 5.0000 mg | ORAL_TABLET | Freq: Every day | ORAL | 3 refills | Status: AC

## 2023-10-02 NOTE — Progress Notes (Signed)
 10/02/2023 11:31 AM   [name] [name] 11-03-1955 [id number]  Referring provider: Sharma Coyer, MD [address] [place],  [place] [address]  Chief Complaint  Patient presents with   Follow-up   Urologic history: 1.  Hypogonadism On topical TRT   2.  BPH with LUTS Dutasteride /tamsulosin    3.  Erectile dysfunction Tadalafil   HPI: [name] [name] is a 68 y.o. male presents for annual follow-up.  Doing well since last visit No bothersome LUTS Denies dysuria, gross hematuria Denies flank, abdominal or pelvic pain  Remains on dutasteride /tamsulosin /tadalafil .  Labs 09/25/23 testosterone  934 ng/dL, PSA 0.4, Y/Y86.0/57.7  PSA trend   Prostate Specific Ag, Serum  Latest Ref Rng 0.0 - 4.0 ng/mL  12/29/2017 0.5   07/12/2018 0.5   07/15/2019 0.8   07/20/2020 0.5   09/17/2021 0.4   06/25/2022 0.5   09/19/2022 0.6   09/25/2023 0.4     PMH: Past Medical History:  Diagnosis Date   Allergy    Annual physical exam 01/19/2023   Arthritis    Knees & hands   Asthma    BP (high blood pressure) 11/07/2014   Should stop combination of Tekturna (DRI)  and ARB    Cataract 2018   Closed fracture of lateral malleolus of left fibula 07/23/2020   GERD (gastroesophageal reflux disease)    Hypertension    HYPERTENSION, SEVERE 03/15/2007   Qualifier: Diagnosis of  By: Winona Raring     Prostate enlargement    Sleep apnea    Status post total knee replacement using cement, left 04/01/2019   Stress fracture of tibia and fibula 07/18/2020    Surgical History: Past Surgical History:  Procedure Laterality Date   APPENDECTOMY  1990   Dr Dessa   COLONOSCOPY  2008   Dr Dessa   COLONOSCOPY WITH PROPOFOL  N/A 08/13/2016   Procedure: COLONOSCOPY WITH PROPOFOL ;  Surgeon: Dessa Reyes ORN, MD;  Location: [facility] ENDOSCOPY;  Service: Endoscopy;  Laterality: N/A;   FRACTURE SURGERY  April 2023   Left Ankle   HEMORRHOID SURGERY     HERNIA REPAIR   01/20/2014   umbilical hernia   JOINT REPLACEMENT  Feb 2021   Left knee replacement   KNEE ARTHROSCOPY Left    KNEE SURGERY Left 1986   NASAL SEPTUM SURGERY     TONSILLECTOMY     TOTAL KNEE ARTHROPLASTY Left 03/29/2019   Procedure: TOTAL KNEE ARTHROPLASTY;  Surgeon: Edie Norleen PARAS, MD;  Location: [facility] ORS;  Service: Orthopedics;  Laterality: Left;   VASECTOMY      Home Medications:  Allergies as of 10/02/2023       Reactions   Hydralazine  Hives, Shortness Of Breath   Chest tightness, severe headache, dyspnea Other reaction(s): Chest Pain, Headache, Respiratory Distress   Wheat Extract Diarrhea, Other (See Comments)   wheat gluten extract   Gluten Meal Diarrhea   bloating   Moxifloxacin Hives   Avelox   Pseudoephedrine Other (See Comments)   Confusion, passed out, numbness   Sulfa Antibiotics Hives   Sulfonamide Derivatives Hives        Medication List        Accurate as of October 02, 2023 11:31 AM. If you have any questions, ask your nurse or doctor.          albuterol  108 (90 Base) MCG/ACT inhaler Commonly known as: VENTOLIN  HFA Inhale 1-2 puffs into the lungs every 6 (six) hours as needed for wheezing or shortness of breath.  albuterol  (2.5 MG/3ML) 0.083% nebulizer solution Commonly known as: PROVENTIL  Take 3 mLs (2.5 mg total) by nebulization in the morning and at bedtime.   dutasteride  0.5 MG capsule Commonly known as: AVODART  TAKE ONE CAPSULE BY MOUTH ONE TIME DAILY   famotidine  40 MG tablet Commonly known as: PEPCID  TAKE ONE TABLET BY MOUTH DAILY   fluticasone -salmeterol 250-50 MCG/ACT Aepb Commonly known as: ADVAIR Inhale 1 puff into the lungs in the morning and at bedtime.   ketotifen 0.025 % ophthalmic solution Commonly known as: ZADITOR Place 1 drop into both eyes daily.   losartan  100 MG tablet Commonly known as: COZAAR  Take 1 tablet (100 mg total) by mouth daily.   tadalafil  5 MG tablet Commonly known as: CIALIS  TAKE ONE TABLET BY  MOUTH AT BEDTIME   tamsulosin  0.4 MG Caps capsule Commonly known as: FLOMAX  TAKE ONE CAPSULE BY MOUTH ONE TIME DAILY   Testosterone  1.62 % Gel APPLY ONE PUMP TO EACH SHOULDER ONE TIME DAILY   verapamil  240 MG CR tablet Commonly known as: CALAN -SR Take 1 tablet (240 mg total) by mouth 2 (two) times daily.        Allergies:  Allergies  Allergen Reactions   Hydralazine  Hives and Shortness Of Breath    Chest tightness, severe headache, dyspnea Other reaction(s): Chest Pain, Headache, Respiratory Distress   Wheat Extract Diarrhea and Other (See Comments)    wheat gluten extract   Gluten Meal Diarrhea    bloating   Moxifloxacin Hives    Avelox   Pseudoephedrine Other (See Comments)    Confusion, passed out, numbness   Sulfa Antibiotics Hives   Sulfonamide Derivatives Hives    Family History: Family History  Problem Relation Age of Onset   Cancer Mother        ovarian   Glaucoma Mother    Cancer Father        prostate   Hypertension Sister    Asthma Sister    Alcohol abuse Paternal Uncle    Liver disease Paternal Uncle    Cancer - Prostate Paternal Uncle    Alcohol abuse Paternal Uncle    Cancer Maternal Uncle        lung; two mat uncles   Cancer Paternal Grandmother        breast   Cancer Paternal Aunt        lung cancer in a smoker   Arthritis Maternal Grandfather    Arthritis Maternal Grandmother    ADD / ADHD Son    Anxiety disorder Son    Cancer Maternal Aunt    Cancer Maternal Uncle     Social History:  reports that he has never smoked. He has never used smokeless tobacco. He reports that he does not currently use alcohol. He reports that he does not use drugs.   Physical Exam: BP 116/61   Pulse 64   Ht 6' (1.829 m)   Wt 194 lb (88 kg)   BMI 26.31 kg/m   Constitutional:  Alert and oriented, No acute distress. HEENT: [place] AT Respiratory: Normal respiratory effort, no increased work of breathing. GU: Prostate 40 grams, smooth without  nodules. Psychiatric: Normal mood and affect.   Assessment & Plan:    1. Hypogonadism Stable, testosterone  refilled Lab visit 6 months with testosterone , H/H. Office visit 1 year with testosterone , H/H, PSA.   2. BPH with moderate LUTS  Stable on tamsulosin /dutasteride /tadalafil  Refills sent  3. Erectile dysfunction Stable on tadalafil    [name] JAYSON Barba, MD  [facility] [address] [place], [place] [address] [phone]

## 2023-10-05 ENCOUNTER — Encounter: Payer: Self-pay | Admitting: Urology

## 2023-10-13 DIAGNOSIS — H353121 Nonexudative age-related macular degeneration, left eye, early dry stage: Secondary | ICD-10-CM | POA: Diagnosis not present

## 2023-10-13 DIAGNOSIS — H0012 Chalazion right lower eyelid: Secondary | ICD-10-CM | POA: Diagnosis not present

## 2023-10-19 ENCOUNTER — Encounter: Payer: Self-pay | Admitting: Family Medicine

## 2023-10-19 ENCOUNTER — Telehealth (INDEPENDENT_AMBULATORY_CARE_PROVIDER_SITE_OTHER): Admitting: Family Medicine

## 2023-10-19 DIAGNOSIS — K449 Diaphragmatic hernia without obstruction or gangrene: Secondary | ICD-10-CM | POA: Diagnosis not present

## 2023-10-19 DIAGNOSIS — I10 Essential (primary) hypertension: Secondary | ICD-10-CM | POA: Diagnosis not present

## 2023-10-19 DIAGNOSIS — Z9889 Other specified postprocedural states: Secondary | ICD-10-CM

## 2023-10-19 MED ORDER — FAMOTIDINE 40 MG PO TABS: 20.0000 mg | ORAL_TABLET | Freq: Every day | ORAL | Status: AC

## 2023-10-19 NOTE — Assessment & Plan Note (Signed)
 Hypertension Chronic  Blood pressure is well-controlled with readings mostly in the 120s/130s over 60s/70s. One isolated elevated reading of 150 was recorded without repetition. No symptoms such as headaches, chest discomfort, or dizziness reported. - Continue monitoring blood pressure twice weekly. - Repeat blood pressure measurement if an elevated reading is noted after rest.

## 2023-10-19 NOTE — Progress Notes (Signed)
 MyChart Video Visit    Virtual Visit via Video Note   This format is felt to be most appropriate for this patient at this time. Physical exam was limited by quality of the video and audio technology used for the visit.   Patient location: Patient's home address   Provider location: [facility]  [address], [address]  [place], [place] [address]   I discussed the limitations of evaluation and management by telemedicine and the availability of in person appointments. The patient expressed understanding and agreed to proceed.  Patient: [name] [name]   DOB: 05-31-1955   68 y.o. Male  MRN: [id number] Visit Date: 10/19/2023  Today's healthcare provider: Rockie Agent, MD   No chief complaint on file.  Subjective    HPI   Discussed the use of AI scribe software for clinical note transcription with the patient, who gave verbal consent to proceed.  History of Present Illness [name] [name] is a 68 year old male with hypertension who presents for a virtual follow-up on his blood pressure management.  His blood pressure has been stable over the past month with readings such as 125/67, 136/72, 130/71, 127/69, 122/57, 124/69, and 115/55. There was one instance where his blood pressure spiked to 150, similar to a previous office visit, but he did not recheck it as it was late at night. He has been monitoring his blood pressure regularly and feels 'great' overall. He is currently not taking hydrochlorothiazide , which was previously considered for restarting.  He mentions a change in his medication for gastric reflux. He is now taking one 20 mg dose of Pepcid  instead of two 20 mg doses, as his reflux symptoms have improved following a hiatal hernia repair surgery. He underwent a Nissen fundoplication, which has significantly reduced his reflux symptoms.  No headaches, chest discomfort, lightheadedness, or dizziness.      Past  Medical History:  Diagnosis Date   Allergy    Annual physical exam 01/19/2023   Arthritis    Knees & hands   Asthma    BP (high blood pressure) 11/07/2014   Should stop combination of Tekturna (DRI)  and ARB    Cataract 2018   Closed fracture of lateral malleolus of left fibula 07/23/2020   GERD (gastroesophageal reflux disease)    Hypertension    HYPERTENSION, SEVERE 03/15/2007   Qualifier: Diagnosis of  By: Winona Raring     Prostate enlargement    Sleep apnea    Status post total knee replacement using cement, left 04/01/2019   Stress fracture of tibia and fibula 07/18/2020       09/29/2023    3:16 PM 08/20/2023    9:17 AM 03/26/2023   10:49 AM  PHQ9 SCORE ONLY  PHQ-9 Total Score 0 0 0       08/20/2023    9:17 AM 03/26/2023   10:50 AM  GAD 7 : Generalized Anxiety Score  Nervous, Anxious, on Edge 0 0  Control/stop worrying 0 0  Worry too much - different things 0 0  Trouble relaxing 0 0  Restless 0 0  Easily annoyed or irritable 0 0  Afraid - awful might happen 0 0  Total GAD 7 Score 0 0  Anxiety Difficulty Not difficult at all Not difficult at all     Medications: Outpatient Medications Prior to Visit  Medication Sig   albuterol  (PROVENTIL ) (2.5 MG/3ML) 0.083% nebulizer solution Take 3 mLs (2.5 mg total) by nebulization in the  morning and at bedtime.   albuterol  (VENTOLIN  HFA) 108 (90 Base) MCG/ACT inhaler Inhale 1-2 puffs into the lungs every 6 (six) hours as needed for wheezing or shortness of breath.   dutasteride  (AVODART ) 0.5 MG capsule Take 1 capsule (0.5 mg total) by mouth daily.   fluticasone -salmeterol (ADVAIR) 250-50 MCG/ACT AEPB Inhale 1 puff into the lungs in the morning and at bedtime.   ketotifen (ZADITOR) 0.025 % ophthalmic solution Place 1 drop into both eyes daily.    losartan  (COZAAR ) 100 MG tablet Take 1 tablet (100 mg total) by mouth daily.   tadalafil  (CIALIS ) 5 MG tablet Take 1 tablet (5 mg total) by mouth at bedtime.   tamsulosin  (FLOMAX )  0.4 MG CAPS capsule Take 1 capsule (0.4 mg total) by mouth daily.   Testosterone  1.62 % GEL APPLY ONE PUMP TO EACH SHOULDER ONE TIME DAILY   verapamil  (CALAN -SR) 240 MG CR tablet Take 1 tablet (240 mg total) by mouth 2 (two) times daily.   [DISCONTINUED] famotidine  (PEPCID ) 40 MG tablet TAKE ONE TABLET BY MOUTH DAILY   No facility-administered medications prior to visit.    Review of Systems      Objective    There were no vitals taken for this visit.      Physical Exam  Physical Exam Vitals reviewed.  Constitutional:      General: He is not in acute distress.    Appearance: Normal appearance. He is not ill-appearing.  Pulmonary:     Effort: Pulmonary effort is normal. No respiratory distress.  Neurological:     Mental Status: He is alert and oriented to person, place, and time.  Psychiatric:        Mood and Affect: Mood normal.        Behavior: Behavior normal.        Thought Content: Thought content normal.        Assessment & Plan     Problem List Items Addressed This Visit       Cardiovascular and Mediastinum   Essential hypertension - Primary   Hypertension Chronic  Blood pressure is well-controlled with readings mostly in the 120s/130s over 60s/70s. One isolated elevated reading of 150 was recorded without repetition. No symptoms such as headaches, chest discomfort, or dizziness reported. - Continue monitoring blood pressure twice weekly. - Repeat blood pressure measurement if an elevated reading is noted after rest.        Respiratory   Hiatal hernia    Status post hiatal hernia repair  Status post hiatal hernia repair with Nissen fundoplication. Follow-up barium study recommended by surgeon to ensure structural integrity post-surgery. Original appointment at [facility] was canceled due to insurance coverage issues. [facility] gastroenterology referral for a barium study to follow up on hiatal hernia repair.       Relevant Orders   Ambulatory referral  to Gastroenterology   Other Visit Diagnoses       History of Nissen fundoplication       Relevant Orders   Ambulatory referral to Gastroenterology       Assessment & Plan   Gastroesophageal reflux disease (GERD) Chronic, improved, dramatically reduced symptoms  GERD symptoms have improved post-surgery. Currently taking 20 mg of Pepcid , reduced from 40 mg. Considering discontinuation as symptoms are well-controlled. - Continue current dose of Pepcid  at 20 mg.      No follow-ups on file.     I discussed the assessment and treatment plan with the patient. The patient was provided an opportunity  to ask questions and all were answered. The patient agreed with the plan and demonstrated an understanding of the instructions.   The patient was advised to call back or seek an in-person evaluation if the symptoms worsen or if the condition fails to improve as anticipated.  I provided 25 minutes of non-face-to-face time during this encounter.   Rockie Agent, MD [facility] [phone] (phone) [phone] (fax)  [facility]

## 2023-10-19 NOTE — Assessment & Plan Note (Signed)
  Status post hiatal hernia repair  Status post hiatal hernia repair with Nissen fundoplication. Follow-up barium study recommended by surgeon to ensure structural integrity post-surgery. Original appointment at [facility] was canceled due to insurance coverage issues. [facility] gastroenterology referral for a barium study to follow up on hiatal hernia repair.

## 2023-11-14 ENCOUNTER — Ambulatory Visit
Admission: EM | Admit: 2023-11-14 | Discharge: 2023-11-14 | Disposition: A | Discharge status: Home / Self Care | Attending: Emergency Medicine | Admitting: Emergency Medicine

## 2023-11-14 DIAGNOSIS — Z760 Encounter for issue of repeat prescription: Secondary | ICD-10-CM

## 2023-11-14 DIAGNOSIS — J4551 Severe persistent asthma with (acute) exacerbation: Secondary | ICD-10-CM | POA: Diagnosis not present

## 2023-11-14 DIAGNOSIS — R058 Other specified cough: Secondary | ICD-10-CM | POA: Diagnosis not present

## 2023-11-14 MED ORDER — ALBUTEROL SULFATE HFA 108 (90 BASE) MCG/ACT IN AERS: 1.0000 | INHALATION_SPRAY | Freq: Four times a day (QID) | RESPIRATORY_TRACT | 0 refills | Status: AC | PRN

## 2023-11-14 MED ORDER — PREDNISONE 10 MG PO TABS: 40.0000 mg | ORAL_TABLET | Freq: Every day | ORAL | 0 refills | Status: AC

## 2023-11-14 MED ORDER — DOXYCYCLINE HYCLATE 100 MG PO CAPS: 100.0000 mg | ORAL_CAPSULE | Freq: Two times a day (BID) | ORAL | 0 refills | Status: AC

## 2023-11-14 NOTE — Discharge Instructions (Addendum)
 Follow up with your primary care provider on Monday.  Go to the emergency department if you have worsening symptoms.    Use the albuterol  inhaler as directed.  Take the doxycycline  and prednisone  as directed.

## 2023-11-14 NOTE — ED Provider Notes (Signed)
 [facility]    CSN: [id number] Arrival date & time: 11/14/23  1255      History   Chief Complaint Chief Complaint  Patient presents with   Cough   Nasal Congestion    HPI [name] [name] is a 68 y.o. male.  Patient presents with 2-week history of productive cough and chest congestion.  Treatment attempted with Mucinex  and cough syrup.  No fever or shortness of breath.  He has required increased use of his albuterol  inhaler.  He states he needs a refill of the albuterol  inhaler as he is low on activations.  Patient is followed by pulmonology for obstructive sleep apnea, bronchiectasis, severe persistent asthma; last seen on 06/22/2023.SABRA  The history is provided by the patient and medical records.    Past Medical History:  Diagnosis Date   Allergy    Annual physical exam 01/19/2023   Arthritis    Knees & hands   Asthma    BP (high blood pressure) 11/07/2014   Should stop combination of Tekturna (DRI)  and ARB    Cataract 2018   Closed fracture of lateral malleolus of left fibula 07/23/2020   GERD (gastroesophageal reflux disease)    Hypertension    HYPERTENSION, SEVERE 03/15/2007   Qualifier: Diagnosis of  By: Winona Raring     Prostate enlargement    Sleep apnea    Status post total knee replacement using cement, left 04/01/2019   Stress fracture of tibia and fibula 07/18/2020    Patient Active Problem List   Diagnosis Date Noted   Pancreatic cyst 03/26/2023   Acute right hip pain 03/26/2023   Annual physical exam 01/19/2023   Encounter for annual wellness visit (AWV) in Medicare patient 01/19/2023   Subcutaneous nodule of right upper extremity 09/19/2022   Moderate persistent asthma with exacerbation 06/24/2022   Syncope and collapse 06/18/2022   Hiatal hernia 03/20/2022   Refractory chronic cough 01/02/2022   Essential hypertension 12/19/2021   Severe persistent asthma 09/26/2021   Osteopenia 07/23/2020   Vitamin D  deficiency 07/23/2020    Erectile dysfunction due to arterial insufficiency 07/15/2018   Primary osteoarthritis of left knee 12/10/2015   Allergic rhinitis 11/07/2014   Benign prostatic hyperplasia with lower urinary tract symptoms 11/07/2014   Hypogonadism in male 11/07/2014   Obstructive apnea 11/07/2014   Plantar fasciitis 11/07/2014   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   Esophageal reflux 03/15/2007   COUGH, CHRONIC 03/15/2007    Past Surgical History:  Procedure Laterality Date   APPENDECTOMY  1990   Dr Dessa   COLONOSCOPY  2008   Dr Dessa   COLONOSCOPY WITH PROPOFOL  N/A 08/13/2016   Procedure: COLONOSCOPY WITH PROPOFOL ;  Surgeon: Dessa Reyes ORN, MD;  Location: [facility] ENDOSCOPY;  Service: Endoscopy;  Laterality: N/A;   FRACTURE SURGERY  April 2023   Left Ankle   HEMORRHOID SURGERY     HERNIA REPAIR  01/20/2014   umbilical hernia   JOINT REPLACEMENT  Feb 2021   Left knee replacement   KNEE ARTHROSCOPY Left    KNEE SURGERY Left 1986   NASAL SEPTUM SURGERY     TONSILLECTOMY     TOTAL KNEE ARTHROPLASTY Left 03/29/2019   Procedure: TOTAL KNEE ARTHROPLASTY;  Surgeon: Edie Norleen PARAS, MD;  Location: [facility] ORS;  Service: Orthopedics;  Laterality: Left;   VASECTOMY         Home Medications    Prior to Admission medications   Medication Sig Start Date End Date Taking?  Authorizing Provider  albuterol  (VENTOLIN  HFA) 108 (90 Base) MCG/ACT inhaler Inhale 1-2 puffs into the lungs every 6 (six) hours as needed. 11/14/23  Yes Corlis Burnard DEL, NP  doxycycline  (VIBRAMYCIN ) 100 MG capsule Take 1 capsule (100 mg total) by mouth 2 (two) times daily for 7 days. 11/14/23 11/21/23 Yes Corlis Burnard DEL, NP  predniSONE  (DELTASONE ) 10 MG tablet Take 4 tablets (40 mg total) by mouth daily for 5 days. 11/14/23 11/19/23 Yes Corlis Burnard DEL, NP  albuterol  (PROVENTIL ) (2.5 MG/3ML) 0.083% nebulizer solution Take 3 mLs (2.5 mg total) by nebulization in the morning and at bedtime. 12/09/22   Isadora Hose, MD   dutasteride  (AVODART ) 0.5 MG capsule Take 1 capsule (0.5 mg total) by mouth daily. 10/02/23   Stoioff, Glendia BROCKS, MD  famotidine  (PEPCID ) 40 MG tablet Take 0.5 tablets (20 mg total) by mouth daily. 10/19/23   Simmons-Robinson, Rockie, MD  fluticasone -salmeterol (ADVAIR) 250-50 MCG/ACT AEPB Inhale 1 puff into the lungs in the morning and at bedtime. 03/25/23   Isadora Hose, MD  ketotifen (ZADITOR) 0.025 % ophthalmic solution Place 1 drop into both eyes daily.     [provider]  losartan  (COZAAR ) 100 MG tablet Take 1 tablet (100 mg total) by mouth daily. 06/29/23   Simmons-Robinson, Makiera, MD  tadalafil  (CIALIS ) 5 MG tablet Take 1 tablet (5 mg total) by mouth at bedtime. 10/02/23   Stoioff, Glendia BROCKS, MD  tamsulosin  (FLOMAX ) 0.4 MG CAPS capsule Take 1 capsule (0.4 mg total) by mouth daily. 10/02/23   Stoioff, Glendia BROCKS, MD  Testosterone  1.62 % GEL APPLY ONE PUMP TO EACH SHOULDER ONE TIME DAILY 10/02/23   Stoioff, Glendia BROCKS, MD  verapamil  (CALAN -SR) 240 MG CR tablet Take 1 tablet (240 mg total) by mouth 2 (two) times daily. 09/29/23   Simmons-Robinson, Rockie, MD    Family History Family History  Problem Relation Age of Onset   Cancer Mother        ovarian   Glaucoma Mother    Cancer Father        prostate   Hypertension Sister    Asthma Sister    Alcohol abuse Paternal Uncle    Liver disease Paternal Uncle    Cancer - Prostate Paternal Uncle    Alcohol abuse Paternal Uncle    Cancer Maternal Uncle        lung; two mat uncles   Cancer Paternal Grandmother        breast   Cancer Paternal Aunt        lung cancer in a smoker   Arthritis Maternal Grandfather    Arthritis Maternal Grandmother    ADD / ADHD Son    Anxiety disorder Son    Cancer Maternal Aunt    Cancer Maternal Uncle     Social History Social History   Tobacco Use   Smoking status: Never   Smokeless tobacco: Never   Tobacco comments:    2nd hand smoke growing up  Vaping Use   Vaping status: Never Used   Substance Use Topics   Alcohol use: Not Currently    Comment: occasionally   Drug use: Never     Allergies   Hydralazine , Wheat extract, Gluten meal, Moxifloxacin, Pseudoephedrine, Sulfa antibiotics, and Sulfonamide derivatives   Review of Systems Review of Systems  Constitutional:  Negative for chills and fever.  HENT:  Positive for congestion. Negative for ear pain and sore throat.   Respiratory:  Positive for cough and wheezing. Negative for shortness  of breath.   Cardiovascular:  Negative for chest pain and palpitations.     Physical Exam Triage Vital Signs ED Triage Vitals  Encounter Vitals Group     BP      Girls Systolic BP Percentile      Girls Diastolic BP Percentile      Boys Systolic BP Percentile      Boys Diastolic BP Percentile      Pulse      Resp      Temp      Temp src      SpO2      Weight      Height      Head Circumference      Peak Flow      Pain Score      Pain Loc      Pain Education      Exclude from Growth Chart    No data found.  Updated Vital Signs BP 121/78   Pulse 80   Temp 98.4 F (36.9 C)   Resp 20   SpO2 98%   Visual Acuity Right Eye Distance:   Left Eye Distance:   Bilateral Distance:    Right Eye Near:   Left Eye Near:    Bilateral Near:     Physical Exam Constitutional:      General: He is not in acute distress. HENT:     Right Ear: Tympanic membrane normal.     Left Ear: Tympanic membrane normal.     Nose: Nose normal.     Mouth/Throat:     Mouth: Mucous membranes are moist.     Pharynx: Oropharynx is clear.  Cardiovascular:     Rate and Rhythm: Normal rate and regular rhythm.     Heart sounds: Normal heart sounds.  Pulmonary:     Effort: Pulmonary effort is normal. No respiratory distress.     Breath sounds: Rhonchi present.     Comments: Coarse rhonchi throughout that does not clear with cough. Neurological:     Mental Status: He is alert.      UC Treatments / Results  Labs (all labs ordered  are listed, but only abnormal results are displayed) Labs Reviewed - No data to display  EKG   Radiology No results found.  Procedures Procedures (including critical care time)  Medications Ordered in UC Medications - No data to display  Initial Impression / Assessment and Plan / UC Course  I have reviewed the triage vital signs and the nursing notes.  Pertinent labs & imaging results that were available during my care of the patient were reviewed by me and considered in my medical decision making (see chart for details).    Severe persistent asthma exacerbation, productive cough, medication refill.  Patient declines chest x-ray.  Afebrile and vital signs are stable.  O2 sat 98% on room air.  Treating today with prednisone , doxycycline , refill of albuterol  inhaler.  Instructed patient to follow-up with his PCP on Monday.  Strict ED precautions discussed.  Education provided on adult asthma.  He agrees to plan of care.  Final Clinical Impressions(s) / UC Diagnoses   Final diagnoses:  Severe persistent asthma with acute exacerbation  Productive cough  Medication refill     Discharge Instructions      Follow up with your primary care provider on Monday.  Go to the emergency department if you have worsening symptoms.    Use the albuterol  inhaler as directed.  Take the doxycycline  and  prednisone  as directed.      ED Prescriptions     Medication Sig Dispense Auth. Provider   albuterol  (VENTOLIN  HFA) 108 (90 Base) MCG/ACT inhaler Inhale 1-2 puffs into the lungs every 6 (six) hours as needed. 18 g Corlis Burnard DEL, NP   predniSONE  (DELTASONE ) 10 MG tablet Take 4 tablets (40 mg total) by mouth daily for 5 days. 20 tablet Corlis Burnard DEL, NP   doxycycline  (VIBRAMYCIN ) 100 MG capsule Take 1 capsule (100 mg total) by mouth 2 (two) times daily for 7 days. 14 capsule Corlis Burnard DEL, NP      PDMP not reviewed this encounter.   Corlis Burnard DEL, NP 11/14/23 1407

## 2023-11-14 NOTE — ED Triage Notes (Signed)
 Patient to Urgent Care with complaints of productive cough (green sputum)/ nasal and chest congestion. No recent fevers.   Symptoms x2 weeks.  Taking Mucinex -DM/ delsym at night.

## 2023-12-22 ENCOUNTER — Other Ambulatory Visit: Payer: Self-pay | Admitting: Student in an Organized Health Care Education/Training Program

## 2023-12-28 ENCOUNTER — Telehealth: Payer: Self-pay | Admitting: Family Medicine

## 2023-12-28 DIAGNOSIS — I10 Essential (primary) hypertension: Secondary | ICD-10-CM

## 2023-12-28 MED ORDER — LOSARTAN POTASSIUM 100 MG PO TABS: 100.0000 mg | ORAL_TABLET | Freq: Every day | ORAL | 1 refills | Status: AC

## 2023-12-28 NOTE — Telephone Encounter (Signed)
[facility] faxed refill request for the following medications:   losartan (COZAAR) 100 MG tablet    Please advise.

## 2024-01-07 DIAGNOSIS — D2272 Melanocytic nevi of left lower limb, including hip: Secondary | ICD-10-CM | POA: Diagnosis not present

## 2024-01-07 DIAGNOSIS — D2261 Melanocytic nevi of right upper limb, including shoulder: Secondary | ICD-10-CM | POA: Diagnosis not present

## 2024-01-07 DIAGNOSIS — L821 Other seborrheic keratosis: Secondary | ICD-10-CM | POA: Diagnosis not present

## 2024-01-07 DIAGNOSIS — Z85828 Personal history of other malignant neoplasm of skin: Secondary | ICD-10-CM | POA: Diagnosis not present

## 2024-01-07 DIAGNOSIS — D2262 Melanocytic nevi of left upper limb, including shoulder: Secondary | ICD-10-CM | POA: Diagnosis not present

## 2024-01-07 DIAGNOSIS — L570 Actinic keratosis: Secondary | ICD-10-CM | POA: Diagnosis not present

## 2024-01-07 DIAGNOSIS — D225 Melanocytic nevi of trunk: Secondary | ICD-10-CM | POA: Diagnosis not present

## 2024-02-19 ENCOUNTER — Emergency Department

## 2024-02-19 ENCOUNTER — Emergency Department
Admission: EM | Admit: 2024-02-19 | Discharge: 2024-02-19 | Disposition: A | Discharge status: Home / Self Care | Attending: Emergency Medicine | Admitting: Emergency Medicine

## 2024-02-19 ENCOUNTER — Encounter: Payer: Self-pay | Admitting: Emergency Medicine

## 2024-02-19 ENCOUNTER — Other Ambulatory Visit: Payer: Self-pay

## 2024-02-19 DIAGNOSIS — J069 Acute upper respiratory infection, unspecified: Secondary | ICD-10-CM | POA: Insufficient documentation

## 2024-02-19 DIAGNOSIS — I10 Essential (primary) hypertension: Secondary | ICD-10-CM | POA: Insufficient documentation

## 2024-02-19 DIAGNOSIS — J45909 Unspecified asthma, uncomplicated: Secondary | ICD-10-CM | POA: Insufficient documentation

## 2024-02-19 LAB — RESP PANEL BY RT-PCR (RSV, FLU A&B, COVID)  RVPGX2
Influenza A by PCR: NEGATIVE
Influenza B by PCR: NEGATIVE
Resp Syncytial Virus by PCR: NEGATIVE
SARS Coronavirus 2 by RT PCR: NEGATIVE

## 2024-02-19 MED ORDER — AMOXICILLIN 500 MG PO CAPS: 1000.0000 mg | ORAL_CAPSULE | Freq: Three times a day (TID) | ORAL | 0 refills | Status: AC

## 2024-02-19 MED ORDER — IPRATROPIUM-ALBUTEROL 0.5-2.5 (3) MG/3ML IN SOLN
3.0000 mL | Freq: Once | RESPIRATORY_TRACT | Status: AC
  Administered 2024-02-19: 3 mL via RESPIRATORY_TRACT
  Filled 2024-02-19: qty 3

## 2024-02-19 MED ORDER — GUAIFENESIN-CODEINE 100-10 MG/5ML PO SOLN: 10.0000 mL | Freq: Four times a day (QID) | ORAL | 0 refills | Status: AC | PRN

## 2024-02-19 NOTE — ED Provider Notes (Signed)
 [facility] Provider Note    Event Date/Time   First MD Initiated Contact with Patient 02/19/24 0710     (approximate)   History   Cough   HPI  [name] [name] is a 68 y.o. male with PMH of asthma, hypertension, sleep apnea presents for evaluation of a cough.  Patient states his symptoms began on Monday.  He reports cough, congestion, fevers, chills, body aches.  He has been taking over-the-counter cough medicine for treatment of his symptoms.  Patient has also been using his previously prescribed inhalers without much relief.      Physical Exam   Triage Vital Signs: ED Triage Vitals  Encounter Vitals Group     BP 02/19/24 0629 (!) 158/79     Girls Systolic BP Percentile --      Girls Diastolic BP Percentile --      Boys Systolic BP Percentile --      Boys Diastolic BP Percentile --      Pulse Rate 02/19/24 0629 93     Resp 02/19/24 0629 19     Temp 02/19/24 0629 99.6 F (37.6 C)     Temp Source 02/19/24 0629 Oral     SpO2 02/19/24 0629 94 %     Weight 02/19/24 0625 195 lb (88.5 kg)     Height 02/19/24 0625 6' (1.829 m)     Head Circumference --      Peak Flow --      Pain Score 02/19/24 0626 0     Pain Loc --      Pain Education --      Exclude from Growth Chart --     Most recent vital signs: Vitals:   02/19/24 0629  BP: (!) 158/79  Pulse: 93  Resp: 19  Temp: 99.6 F (37.6 C)  SpO2: 94%   General: Awake, no distress.  CV:  Good peripheral perfusion.  RRR. Resp:  Normal effort.  Coarse rhonchi throughout the lungs but do not clear with coughing. Abd:  No distention.  Other:  Radial pulses 2+ and regular bilaterally.   ED Results / Procedures / Treatments   Labs (all labs ordered are listed, but only abnormal results are displayed) Labs Reviewed  RESP PANEL BY RT-PCR (RSV, FLU A&B, COVID)  [id number]   RADIOLOGY  Chest x-ray obtained, interpreted the images as well as reviewed the radiologist report which was  negative for acute abnormalities.  PROCEDURES:  Critical Care performed: No  Procedures   MEDICATIONS ORDERED IN ED: Medications  ipratropium-albuterol  (DUONEB) 0.5-2.5 (3) MG/3ML nebulizer solution 3 mL (3 mLs Nebulization Given 02/19/24 0727)     IMPRESSION / MDM / ASSESSMENT AND PLAN / ED COURSE  I reviewed the triage vital signs and the nursing notes.                             68 year old male presents for evaluation of cough.  Blood pressure is little bit elevated, vital signs stable otherwise.  Patient coughing frequently on exam but otherwise NAD.  Differential diagnosis includes, but is not limited to, flu, COVID, RSV, bronchitis, pneumonia, asthma exacerbation.  Patient's presentation is most consistent with acute complicated illness / injury requiring diagnostic workup.  I do not hear wheezing on lung auscultation so lower suspicion for asthma exacerbation at this time.  Patient tested negative for flu, COVID and RSV.  Chest x-ray does not show pneumonia.  Patient is  coughing pretty significantly on exam, will treat with DuoNeb and reassess for improvement.  Based on patient's age and history of asthma as well as the severity of his symptoms I did discuss antibiotic treatment with the patient.  I do feel at this point it is still viral and that antibiotics are not needed.  Through shared decision making we discussed sending antibiotics and him starting them in a few days if symptoms are not improving.  I do feel like the patient understands the plan well and I am comfortable with this.  If he is not getting better in the next 2 to 3 days he will start the antibiotics.  I explained that he should give himself 48 hours for improvement after starting the antibiotics and if things are not getting better he should follow-up with another healthcare provider.  Patient also requested some cough medication to help him sleep which I am happy to send.  Discussed not driving after taking  this medication as it contains codeine .  Patient voiced understanding, all questions were answered he was stable at discharge.  Clinical Course as of 02/19/24 9191  Fri Feb 19, 2024  0801 Patient reports some improvement after receiving the breathing treatment.  I do not notice much difference on lung auscultation.  Still having lots of rhonchi. [LD]    Clinical Course User Index [LD] Cleaster Tinnie LABOR, PA-C     FINAL CLINICAL IMPRESSION(S) / ED DIAGNOSES   Final diagnoses:  Acute upper respiratory infection     Rx / DC Orders   ED Discharge Orders          Ordered    guaiFENesin -codeine  100-10 MG/5ML syrup  Every 6 hours PRN        02/19/24 0807    amoxicillin  (AMOXIL ) 500 MG capsule  3 times daily        02/19/24 0807             Note:  This document was prepared using Dragon voice recognition software and may include unintentional dictation errors.   Cleaster Tinnie LABOR, PA-C 02/19/24 9191    Waymond Lorelle Cummins, MD 02/19/24 [phone]

## 2024-02-19 NOTE — ED Notes (Signed)
 See triage note  Presents with prod cough for couple of days  States light greenish phlegm  Low grade temp on arrival

## 2024-02-19 NOTE — Discharge Instructions (Signed)
 You tested negative for flu, COVID and RSV today.  Your chest x-ray was normal and did not show signs of pneumonia.  I have sent some strong cough medication for you to take.  Please take this as prescribed.  Do not drive after taking it as this medication is sedating.  While at this point in time I believe your infection is due to a virus which will not be helped with antibiotics, I have sent some antibiotics to the pharmacy.  If you are not improving over the next 2 to 3 days please pick this medication up and begin taking it as prescribed.  If after starting the antibiotics you are not improving within 48 hours please be seen by another healthcare provider.  Return to the emergency department with any worsening symptoms like difficulty breathing or chest pain.

## 2024-02-19 NOTE — ED Triage Notes (Signed)
 Patient ambulatory to triage with steady gait, without difficulty or distress noted; pt reports since Monday having prod cough green sputum accomp by fever and sinus congestion; taking mucinex  and delsym without relief

## 2024-02-22 ENCOUNTER — Ambulatory Visit: Payer: Self-pay

## 2024-02-22 ENCOUNTER — Encounter: Payer: Self-pay | Admitting: Pulmonary Disease

## 2024-02-22 ENCOUNTER — Ambulatory Visit: Admitting: Pulmonary Disease

## 2024-02-22 VITALS — BP 136/78 | HR 94 | Temp 98.5°F | Ht 72.0 in | Wt 202.0 lb

## 2024-02-22 DIAGNOSIS — J479 Bronchiectasis, uncomplicated: Secondary | ICD-10-CM | POA: Diagnosis not present

## 2024-02-22 DIAGNOSIS — J069 Acute upper respiratory infection, unspecified: Secondary | ICD-10-CM | POA: Diagnosis not present

## 2024-02-22 DIAGNOSIS — J4551 Severe persistent asthma with (acute) exacerbation: Secondary | ICD-10-CM | POA: Diagnosis not present

## 2024-02-22 DIAGNOSIS — J4550 Severe persistent asthma, uncomplicated: Secondary | ICD-10-CM

## 2024-02-22 MED ORDER — PREDNISONE 20 MG PO TABS: 20.0000 mg | ORAL_TABLET | Freq: Every day | ORAL | 0 refills | Status: AC

## 2024-02-22 MED ORDER — FLUTICASONE-SALMETEROL 500-50 MCG/ACT IN AEPB: 1.0000 | INHALATION_SPRAY | Freq: Two times a day (BID) | RESPIRATORY_TRACT | 6 refills | Status: AC

## 2024-02-22 MED ORDER — CEFPODOXIME PROXETIL 200 MG PO TABS: 200.0000 mg | ORAL_TABLET | Freq: Two times a day (BID) | ORAL | 0 refills | Status: AC

## 2024-02-22 NOTE — Telephone Encounter (Signed)
 Noted. Nothing further needed.

## 2024-02-22 NOTE — Telephone Encounter (Signed)
 FYI Only or Action Required?: FYI only for provider: appointment scheduled on 02/22/24.  Patient is followed in Pulmonology for OSA, last seen on 06/22/2023 by Fernand Elfreda LABOR, MD.  Called Nurse Triage reporting Cough.  Symptoms began a week ago.  Interventions attempted: OTC medications: Mucinex  and Prescription medications: guaiFENesin -codeine  100-10 MG/5ML syrup  amoxicillin  (AMOXIL ) 500 MG.  Symptoms are: unchanged.  Triage Disposition: See Physician Within 24 Hours  Patient/caregiver understands and will follow disposition?: Yes Reason for Disposition  [1] Continuous (nonstop) coughing interferes with work or school AND [2] no improvement using cough treatment per Care Advice  Answer Assessment - Initial Assessment Questions Patient was seen in ED 12/12, testes and xray came back clear. Given    amoxicillin  (AMOXIL ) 500 MG capsule  3 times daily from ED and was advised to take it if starting to cough up green mucous, patient took first dose today. Reports mild relief with guaiFENesin -codeine  100-10 MG/5ML syrup at night. Takes mucinex  during the day. Patient has appointment 12/18 with Dr. Isadora and requesting to be seen sooner. Sick visit scheduled today with Dr. Malka     1. ONSET: When did the cough begin?      A week ago  2. SEVERITY: How bad is the cough today?      Severe  3. SPUTUM: Describe the color of your sputum (e.g., none, dry cough; clear, white, yellow, green)     Very minimal, slightly green   5. DIFFICULTY BREATHING: Are you having difficulty breathing? If Yes, ask: How bad is it? (e.g., mild, moderate, severe)      SOB and wheezing with exertion, reports O2 94%  6. FEVER: Do you have a fever? If Yes, ask: What is your temperature, how was it measured, and when did it start?     101.1 today  7. CARDIAC HISTORY: Do you have any history of heart disease? (e.g., heart attack, congestive heart failure)      Denies  8. LUNG HISTORY: Do you  have any history of lung disease?  (e.g., pulmonary embolus, asthma, emphysema)     Asthma, OSA  10. OTHER SYMPTOMS: Do you have any other symptoms? (e.g., runny nose, wheezing, chest pain)       Wheezing, loss of appetite, head and nasal congestion  Protocols used: Cough - Acute Non-Productive-A-AH  Copied from CRM #[id number]. Topic: Clinical - Red Word Triage >> Feb 22, 2024  9:08 AM Essie A wrote: Red Word that prompted transfer to Nurse Triage: Patient is presenting coughing, 101.5 fever, wheezing and shortness of breath.  Went to ER on 02/19/24, was given cough med with codeine  and antibiotic in case of productive cough.  All of this since 02/15/24.

## 2024-02-22 NOTE — Progress Notes (Signed)
 Synopsis: Referred in by Sharma Coyer, MD   Subjective:   PATIENT ID: [name] [name] GENDER: male DOB: 1955/07/27, MRN: [id number]  Chief Complaint  Patient presents with   Acute Visit    Increased SOB. Wheezing. Cough, dry. Symptoms for a week. Amoxicillin - stared today. guaiFENesin -codeine  100-10 MG/5ML syrup- as night. Advair- BID. Albuterol - QID      HPI [name] is a pleasant 68 year old male presenting to clinic for follow up. He had his Nissen fundoplication in October of 2024 and presents for routine follow up.   Since our last visit, he's had his surgery 12/2022 which was uncomplicated. He felt great after the surgery with no symptoms and significant improvement in his cough. He was doing excellent until christmas when he caught a cold from his grandchild. This has improved drastically but he continues to have an occasional cough that is productive of clear sputum. Denies fevers or chills, denies chest pain. No shortness of breath reported. He continues on Advair 500-50. He is compliant with the flutter device and incentive spirometer, and washes his mouth with every use of Advair.    He was initially seen by me in September of 2023 for symptoms of cough productive of whitish (sometimes yellow) sputum, associated with shortness of breath, exertional dyspnea, and wheezing. He was diagnosed with asthma many years ago by Dr. Frutoso. He has had multiple exacerbations of his asthma, requiring antibiotics and steroids.   He does not have any pets and is a non-smoker (thou reports previous second hand exposure). He does not have any chest pain or tightness, nor does he have any fevers, chills, night sweats, or weight loss. He is compliant with his medications and is using Advair twice daily.   He's previously been worked up at [facility] by Avelina Bolk, MD. At that time, he was referred for low immunoglobulin levels. A high resolution CT chest in 2020 at [facility]  suggested possible early bronchiectasis. He was referred to ENT and GI for further workup of sinusitis and reflux disease. Given concern for silent aspiration triggering his asthma, further workup was pursued. He was eventually referred to thoracic surgery after esophageal manometry showed a medium to large sized hiatal hernia. Barium swallow in January of 2023 didn't show a hiatal hernia but did show severe GERD with signs of aspiration. He has since underwent robot assisted laparoscopic repair of his paraesophageal hernia and fundoplication.   He is a never smoker, but reports second hand exposure. He used to work at [employer] and retired recently. He continues to be active in his church. He reports significant mold exposure at [facility], and suspects there might be mold exposure at church. He does not have any pets.   OV 02/22/2024 -Mr. [name] is a pleasant 68 years old male patient with a past medical history of severe persistent asthma, bronchiectasis secondary to recurrent aspiration due to hiatal hernia status post fundoplication at [facility] in 2024, patient of Dr. Isadora presenting today for an acute visit for ongoing fevers, shortness of breath cough and chest tightness that started about a week ago.  This prompted his presentation last Friday to the urgent care clinic where he was prescribed amoxicillin .  Chest x-ray at that time without any parenchymal lung disease.  He reports today that he is still spiking fever up to 101 still coughing intermittently during the encounter.  He had significant wheezing with rhonchi on my exam.  He appears to be in a asthma exacerbation secondary to  viral infection.  I will prescribe him a short course of prednisone  and increase his Advair to 500-50.  Given his underlying bronchiectasis and risk factors I will also prescribe him a short course of cefpodoxime .  He will follow-up with Dr. Everardo on Thursday. Family History  Problem Relation Age of Onset   Cancer  Mother        ovarian   Glaucoma Mother    Cancer Father        prostate   Hypertension Sister    Asthma Sister    Alcohol abuse Paternal Uncle    Liver disease Paternal Uncle    Cancer - Prostate Paternal Uncle    Alcohol abuse Paternal Uncle    Cancer Maternal Uncle        lung; two mat uncles   Cancer Paternal Grandmother        breast   Cancer Paternal Aunt        lung cancer in a smoker   Arthritis Maternal Grandfather    Arthritis Maternal Grandmother    ADD / ADHD Son    Anxiety disorder Son    Cancer Maternal Aunt    Cancer Maternal Uncle      Social History   Socioeconomic History   Marital status: Married    Spouse name: Not on file   Number of children: Not on file   Years of education: Not on file   Highest education level: Doctorate  Occupational History   Not on file  Tobacco Use   Smoking status: Never   Smokeless tobacco: Never   Tobacco comments:    2nd hand smoke growing up  Vaping Use   Vaping status: Never Used  Substance and Sexual Activity   Alcohol use: Not Currently    Comment: occasionally   Drug use: Never   Sexual activity: Yes    Birth control/protection: Surgical  Other Topics Concern   Not on file  Social History Narrative   Not on file   Social Drivers of Health   Tobacco Use: Low Risk (02/22/2024)   Patient History    Smoking Tobacco Use: Never    Smokeless Tobacco Use: Never    Passive Exposure: Not on file  Financial Resource Strain: Low Risk (09/29/2023)   Overall Financial Resource Strain (CARDIA)    Difficulty of Paying Living Expenses: Not hard at all  Food Insecurity: No Food Insecurity (09/29/2023)   Epic    Worried About Radiation Protection Practitioner of Food in the Last Year: Never true    Ran Out of Food in the Last Year: Never true  Transportation Needs: No Transportation Needs (09/29/2023)   Epic    Lack of Transportation (Medical): No    Lack of Transportation (Non-Medical): No  Physical Activity: Insufficiently Active  (09/29/2023)   Exercise Vital Sign    Days of Exercise per Week: 2 days    Minutes of Exercise per Session: 20 min  Stress: No Stress Concern Present (09/29/2023)   [employer] of Occupational Health - Occupational Stress Questionnaire    Feeling of Stress: Not at all  Social Connections: Socially Integrated (09/29/2023)   Social Connection and Isolation Panel    Frequency of Communication with Friends and Family: More than three times a week    Frequency of Social Gatherings with Friends and Family: More than three times a week    Attends Religious Services: More than 4 times per year    Active Member of [employer] or Organizations: Yes  Attends Banker Meetings: More than 4 times per year    Marital Status: Married  Catering Manager Violence: Not At Risk (09/29/2023)   Epic    Fear of Current or Ex-Partner: No    Emotionally Abused: No    Physically Abused: No    Sexually Abused: No  Depression (PHQ2-9): Low Risk (09/29/2023)   Depression (PHQ2-9)    PHQ-2 Score: 0  Alcohol Screen: Low Risk (09/29/2023)   Alcohol Screen    Last Alcohol Screening Score (AUDIT): 1  Housing: Unknown (09/29/2023)   Epic    Unable to Pay for Housing in the Last Year: No    Number of Times Moved in the Last Year: Not on file    Homeless in the Last Year: No  Utilities: Not At Risk (09/29/2023)   Epic    Threatened with loss of utilities: No  Health Literacy: Adequate Health Literacy (09/29/2023)   B1300 Health Literacy    Frequency of need for help with medical instructions: Never        Objective:   Vitals:   02/22/24 1355  BP: 136/78  Pulse: 94  Temp: 98.5 F (36.9 C)  SpO2: 96%  Weight: 202 lb (91.6 kg)  Height: 6' (1.829 m)   96% on LPM RA BMI Readings from Last 3 Encounters:  02/22/24 27.40 kg/m  02/19/24 26.45 kg/m  10/02/23 26.31 kg/m   Wt Readings from Last 3 Encounters:  02/22/24 202 lb (91.6 kg)  02/19/24 195 lb (88.5 kg)  10/02/23 194 lb (88 kg)     Physical Exam GEN: NAD, Healthy Appearing HEENT: Supple Neck, Reactive Pupils, EOMI  CVS: Normal S1, Normal S2, RRR, No murmurs or ES appreciated  Lungs: Diffuse expiratory wheezing bilaterally with ronchi.  Abdomen: Soft, non tender, non distended, + BS  Extremities: Warm and well perfused, No edema   Labs and imaging were reviewed.   Ancillary Information   CBC    Component Value Date/Time   WBC 4.8 08/25/2023 0000   WBC 3.8 (L) 03/06/2023 1244   RBC 4.28 08/25/2023 0000   RBC 4.42 03/06/2023 1244   HGB 13.9 09/25/2023 0756   HCT 42.2 09/25/2023 0756   PLT 184 08/25/2023 0000   MCV 98 (H) 08/25/2023 0000   MCH 32.7 08/25/2023 0000   MCH 31.7 03/06/2023 1244   MCHC 33.4 08/25/2023 0000   MCHC 33.7 03/06/2023 1244   RDW 12.9 08/25/2023 0000   LYMPHSABS 0.6 (L) 02/01/2022 0944   LYMPHSABS 2.2 11/20/2021 0850   MONOABS 0.7 02/01/2022 0944   EOSABS 0.0 02/01/2022 0944   EOSABS 0.1 11/20/2021 0850   BASOSABS 0.1 02/01/2022 0944   BASOSABS 0.1 11/20/2021 0850       Latest Ref Rng & Units 04/03/2022   11:30 AM  PFT Results  FVC-Pre L 5.13   FVC-Predicted Pre % 104   FVC-Post L 5.27   FVC-Predicted Post % 107   Pre FEV1/FVC % % 90   Post FEV1/FCV % % 90   FEV1-Pre L 4.61   FEV1-Predicted Pre % 126   FEV1-Post L 4.75   DLCO uncorrected ml/min/mmHg 35.99   DLCO UNC% % 127   DLVA Predicted % 107   TLC L 6.80   TLC % Predicted % 91   RV % Predicted % 78      Assessment & Plan:  1.Astham Exacerbation secondary to a URI 2.Severe persistent asthma 3.Bronchiectasis without complication  Mr. Moomaw is a pleasant 68 years old male  patient with a past medical history of severe persistent asthma, bronchiectasis secondary to recurrent aspiration due to hiatal hernia status post fundoplication at [facility] in 2024, patient of Dr. Isadora presenting today for an acute visit for ongoing fevers, shortness of breath cough and chest tightness that started about a week ago.   This prompted his presentation last Friday to the urgent care clinic where he was prescribed amoxicillin .  Chest x-ray at that time without any parenchymal lung disease.  He reports today that he is still spiking fever up to 101 still coughing intermittently during the encounter.  He had significant wheezing with rhonchi on my exam.  He appears to be in a asthma exacerbation secondary to viral infection.  I will prescribe him a short course of prednisone  and increase his Advair to 500-50.  Given his underlying bronchiectasis and risk factors I will also prescribe him a short course of cefpodoxime .  He will follow-up with Dr. Everardo on Thursday.  Return if symptoms worsen or fail to improve.  I personally spent a total of 40 minutes in the care of the patient today including preparing to see the patient, getting/reviewing separately obtained history, performing a medically appropriate exam/evaluation, counseling and educating, placing orders, documenting clinical information in the EHR, independently interpreting results, and communicating results.   Darrin Barn, MD [facility] 02/22/2024 4:49 PM

## 2024-02-25 ENCOUNTER — Telehealth: Payer: Self-pay

## 2024-02-25 ENCOUNTER — Encounter: Payer: Self-pay | Admitting: Student in an Organized Health Care Education/Training Program

## 2024-02-25 ENCOUNTER — Ambulatory Visit: Admitting: Student in an Organized Health Care Education/Training Program

## 2024-02-25 VITALS — BP 138/78 | HR 103 | Temp 98.7°F | Ht 72.0 in | Wt 204.0 lb

## 2024-02-25 DIAGNOSIS — J4550 Severe persistent asthma, uncomplicated: Secondary | ICD-10-CM

## 2024-02-25 DIAGNOSIS — J479 Bronchiectasis, uncomplicated: Secondary | ICD-10-CM

## 2024-02-25 MED ORDER — SODIUM CHLORIDE 3 % IN NEBU: INHALATION_SOLUTION | Freq: Two times a day (BID) | RESPIRATORY_TRACT | 12 refills | Status: AC

## 2024-02-25 MED ORDER — ALBUTEROL SULFATE (2.5 MG/3ML) 0.083% IN NEBU: 2.5000 mg | INHALATION_SOLUTION | Freq: Two times a day (BID) | RESPIRATORY_TRACT | 12 refills | Status: AC

## 2024-02-25 MED ORDER — ALBUTEROL SULFATE (2.5 MG/3ML) 0.083% IN NEBU: 2.5000 mg | INHALATION_SOLUTION | Freq: Two times a day (BID) | RESPIRATORY_TRACT | 12 refills | Status: DC

## 2024-02-25 NOTE — Addendum Note (Signed)
 Addended by: Zafira Munos J on: 02/25/2024 12:49 PM   Modules accepted: Orders

## 2024-02-25 NOTE — Progress Notes (Unsigned)
 Synopsis: Referred in *** by Sharma Coyer, MD  Assessment & Plan:   1. Bronchiectasis without complication (HCC) *** - albuterol  (PROVENTIL ) (2.5 MG/3ML) 0.083% nebulizer solution; Take 3 mLs (2.5 mg total) by nebulization in the morning and at bedtime.  Dispense: 75 mL; Refill: 12 - sodium chloride  HYPERTONIC 3 % nebulizer solution; Take by nebulization as needed for other.  Dispense: 750 mL; Refill: 12  2. Severe persistent asthma, unspecified whether complicated (HCC) *** - albuterol  (PROVENTIL ) (2.5 MG/3ML) 0.083% nebulizer solution; Take 3 mLs (2.5 mg total) by nebulization in the morning and at bedtime.  Dispense: 75 mL; Refill: 12 - sodium chloride  HYPERTONIC 3 % nebulizer solution; Take by nebulization as needed for other.  Dispense: 750 mL; Refill: 12   Assessment and Plan Assessment & Plan Severe persistent asthma Recent exacerbation due to viral and bacterial infection. Improvement with cefpodoxime  and increased Advair. Persistent nasal congestion and dyspnea. Avoided codeine  due to narcotic effects. - Continue cefpodoxime  as prescribed. - Continue Advair 500-50 MCG/ACT inhalation twice daily. - Refilled hypertonic saline and albuterol  for nebulization. - Resume clearance regimen: hypertonic saline, albuterol , Flutter device, and incentive spirometer twice daily, then reduce to once daily when symptoms improve. - Avoid cough syrup with codeine ; allow cough to resolve naturally.  Bronchiectasis without complication Mild airway dilation with increased mucus production due to recent illness. - Continue clearance regimen as outlined for asthma management. - Schedule follow-up in four weeks to assess progress and consider stepping down inhaler therapy if symptoms resolve.   Return in about 4 weeks (around 03/24/2024).  Belva November, MD [facility] 02/25/2024 9:44 AM   I spent *** minutes caring for this patient today, including {EM  billing:28027}  End of visit medications:  Meds ordered this encounter  Medications   albuterol  (PROVENTIL ) (2.5 MG/3ML) 0.083% nebulizer solution    Sig: Take 3 mLs (2.5 mg total) by nebulization in the morning and at bedtime.    Dispense:  75 mL    Refill:  12   sodium chloride  HYPERTONIC 3 % nebulizer solution    Sig: Take by nebulization as needed for other.    Dispense:  750 mL    Refill:  12    Current Medications[1]   Subjective:   PATIENT ID: [name] [name] GENDER: male DOB: 1956/02/02, MRN: [id number]  Chief Complaint  Patient presents with   Asthma    Cough with bright red blood. New symptom since Monday. Wheezing. SOB. Advair- BID. Albuterol - PRN      HPI  Discussed the use of AI scribe software for clinical note transcription with the patient, who gave verbal consent to proceed.  History of Present Illness [name] [name] is a 68 year old male with severe persistent asthma who presents for follow-up after a recent exacerbation.  He has severe persistent asthma secondary to recurrent aspiration from reflux, which was surgically treated with a Nissen fundoplication a year ago, leading to significant improvement in symptoms. Earlier this month, he experienced an exacerbation that did not respond to a course of amoxicillin  and a short course of prednisone .  Earlier this week, he was seen by a colleague and was prescribed cefpodoxime . He began taking Advair 500/50 as directed. Since starting the new medication regimen on Tuesday, he feels better, although he still feels 'really sick' and 'really weak' and continues to cough through the night. He is almost out of the cough syrup with codeine  that was previously prescribed.  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 He recalls an exacerbation in September, which he attributes to bronchitis rather than pneumonia,  and notes that he did not use his clearance regimen, including hypertonic saline and albuterol , after feeling better post-surgery.  He reports coughing up red or rust-colored phlegm. No head congestion or green nasal discharge. He uses Flonase  for nasal symptoms. He also mentions that he had stopped using his clearance regimen, including hypertonic saline, albuterol , Flutter device, and incentive spirometer, after feeling better post-surgery.   Ancillary information including prior medications, full medical/surgical/family/social histories, and PFTs (when available) are listed below and have been reviewed.   {PULM QUESTIONNAIRES (Optional):33196}  ROS   Objective:   Vitals:   02/25/24 0920  BP: 138/78  Pulse: (!) 103  Temp: 98.7 F (37.1 C)  SpO2: 92%  Weight: 204 lb (92.5 kg)  Height: 6' (1.829 m)   92% on *** LPM *** RA BMI Readings from Last 3 Encounters:  02/25/24 27.67 kg/m  02/22/24 27.40 kg/m  02/19/24 26.45 kg/m   Wt Readings from Last 3 Encounters:  02/25/24 204 lb (92.5 kg)  02/22/24 202 lb (91.6 kg)  02/19/24 195 lb (88.5 kg)    .vitalsmbmi  Physical Exam    Ancillary Information    Past Medical History:  Diagnosis Date   Allergy    Annual physical exam 01/19/2023   Arthritis    Knees & hands   Asthma    BP (high blood pressure) 11/07/2014   Should stop combination of Tekturna (DRI)  and ARB    Cataract 2018   Closed fracture of lateral malleolus of left fibula 07/23/2020   GERD (gastroesophageal reflux disease)    Hypertension    HYPERTENSION, SEVERE 03/15/2007   Qualifier: Diagnosis of  By: Winona Raring     Prostate enlargement    Sleep apnea    Status post total knee replacement using cement, left 04/01/2019   Stress fracture of tibia and fibula 07/18/2020     Family History  Problem Relation Age of Onset   Cancer Mother        ovarian   Glaucoma Mother    Cancer Father        prostate   Hypertension Sister    Asthma Sister     Alcohol abuse Paternal Uncle    Liver disease Paternal Uncle    Cancer - Prostate Paternal Uncle    Alcohol abuse Paternal Uncle    Cancer Maternal Uncle        lung; two mat uncles   Cancer Paternal Grandmother        breast   Cancer Paternal Aunt        lung cancer in a smoker   Arthritis Maternal Grandfather    Arthritis Maternal Grandmother    ADD / ADHD Son    Anxiety disorder Son    Cancer Maternal Aunt    Cancer Maternal Uncle      Past Surgical History:  Procedure Laterality Date   APPENDECTOMY  1990   Dr Dessa   COLONOSCOPY  2008   Dr Dessa   COLONOSCOPY WITH PROPOFOL  N/A 08/13/2016   Procedure: COLONOSCOPY WITH PROPOFOL ;  Surgeon: Dessa Reyes ORN, MD;  Location: [facility];  Service: Endoscopy;  Laterality: N/A;   FRACTURE SURGERY  April 2023   Left Ankle   HEMORRHOID SURGERY     HERNIA REPAIR  01/20/2014   umbilical hernia  JOINT REPLACEMENT  Feb 2021   Left knee replacement   KNEE ARTHROSCOPY Left    KNEE SURGERY Left 1986   NASAL SEPTUM SURGERY     TONSILLECTOMY     TOTAL KNEE ARTHROPLASTY Left 03/29/2019   Procedure: TOTAL KNEE ARTHROPLASTY;  Surgeon: Edie Norleen PARAS, MD;  Location: [facility] ORS;  Service: Orthopedics;  Laterality: Left;   VASECTOMY      Social History   Socioeconomic History   Marital status: Married    Spouse name: Not on file   Number of children: Not on file   Years of education: Not on file   Highest education level: Doctorate  Occupational History   Not on file  Tobacco Use   Smoking status: Never   Smokeless tobacco: Never   Tobacco comments:    2nd hand smoke growing up  Vaping Use   Vaping status: Never Used  Substance and Sexual Activity   Alcohol use: Not Currently    Comment: occasionally   Drug use: Never   Sexual activity: Yes    Birth control/protection: Surgical  Other Topics Concern   Not on file  Social History Narrative   Not on file   Social Drivers of Health   Tobacco Use: Low Risk  (02/25/2024)   Patient History    Smoking Tobacco Use: Never    Smokeless Tobacco Use: Never    Passive Exposure: Not on file  Financial Resource Strain: Low Risk (09/29/2023)   Overall Financial Resource Strain (CARDIA)    Difficulty of Paying Living Expenses: Not hard at all  Food Insecurity: No Food Insecurity (09/29/2023)   Epic    Worried About Radiation Protection Practitioner of Food in the Last Year: Never true    Ran Out of Food in the Last Year: Never true  Transportation Needs: No Transportation Needs (09/29/2023)   Epic    Lack of Transportation (Medical): No    Lack of Transportation (Non-Medical): No  Physical Activity: Insufficiently Active (09/29/2023)   Exercise Vital Sign    Days of Exercise per Week: 2 days    Minutes of Exercise per Session: 20 min  Stress: No Stress Concern Present (09/29/2023)   [employer] of Occupational Health - Occupational Stress Questionnaire    Feeling of Stress: Not at all  Social Connections: Socially Integrated (09/29/2023)   Social Connection and Isolation Panel    Frequency of Communication with Friends and Family: More than three times a week    Frequency of Social Gatherings with Friends and Family: More than three times a week    Attends Religious Services: More than 4 times per year    Active Member of Clubs or Organizations: Yes    Attends Banker Meetings: More than 4 times per year    Marital Status: Married  Catering Manager Violence: Not At Risk (09/29/2023)   Epic    Fear of Current or Ex-Partner: No    Emotionally Abused: No    Physically Abused: No    Sexually Abused: No  Depression (PHQ2-9): Low Risk (09/29/2023)   Depression (PHQ2-9)    PHQ-2 Score: 0  Alcohol Screen: Low Risk (09/29/2023)   Alcohol Screen    Last Alcohol Screening Score (AUDIT): 1  Housing: Unknown (09/29/2023)   Epic    Unable to Pay for Housing in the Last Year: No    Number of Times Moved in the Last Year: Not on file    Homeless in the Last Year:  No  Utilities: Not  At Risk (09/29/2023)   Epic    Threatened with loss of utilities: No  Health Literacy: Adequate Health Literacy (09/29/2023)   B1300 Health Literacy    Frequency of need for help with medical instructions: Never     Allergies[2]   CBC    Component Value Date/Time   WBC 4.8 08/25/2023 0000   WBC 3.8 (L) 03/06/2023 1244   RBC 4.28 08/25/2023 0000   RBC 4.42 03/06/2023 1244   HGB 13.9 09/25/2023 0756   HCT 42.2 09/25/2023 0756   PLT 184 08/25/2023 0000   MCV 98 (H) 08/25/2023 0000   MCH 32.7 08/25/2023 0000   MCH 31.7 03/06/2023 1244   MCHC 33.4 08/25/2023 0000   MCHC 33.7 03/06/2023 1244   RDW 12.9 08/25/2023 0000   LYMPHSABS 0.6 (L) 02/01/2022 0944   LYMPHSABS 2.2 11/20/2021 0850   MONOABS 0.7 02/01/2022 0944   EOSABS 0.0 02/01/2022 0944   EOSABS 0.1 11/20/2021 0850   BASOSABS 0.1 02/01/2022 0944   BASOSABS 0.1 11/20/2021 0850    Pulmonary Functions Testing Results:    Latest Ref Rng & Units 04/03/2022   11:30 AM  PFT Results  FVC-Pre L 5.13   FVC-Predicted Pre % 104   FVC-Post L 5.27   FVC-Predicted Post % 107   Pre FEV1/FVC % % 90   Post FEV1/FCV % % 90   FEV1-Pre L 4.61   FEV1-Predicted Pre % 126   FEV1-Post L 4.75   DLCO uncorrected ml/min/mmHg 35.99   DLCO UNC% % 127   DLVA Predicted % 107   TLC L 6.80   TLC % Predicted % 91   RV % Predicted % 78     Outpatient Medications Prior to Visit  Medication Sig Dispense Refill   albuterol  (VENTOLIN  HFA) 108 (90 Base) MCG/ACT inhaler Inhale 1-2 puffs into the lungs every 6 (six) hours as needed. 18 g 0   cefpodoxime  (VANTIN ) 200 MG tablet Take 1 tablet (200 mg total) by mouth 2 (two) times daily for 7 days. 14 tablet 0   dutasteride  (AVODART ) 0.5 MG capsule Take 1 capsule (0.5 mg total) by mouth daily. 90 capsule 3   famotidine  (PEPCID ) 40 MG tablet Take 0.5 tablets (20 mg total) by mouth daily.     fluticasone  (FLONASE ) 50 MCG/ACT nasal spray USE ONE SPRAY IN EACH NOSTRIL ONE TIME DAILY 16  mL 3   fluticasone -salmeterol (ADVAIR DISKUS) 500-50 MCG/ACT AEPB Inhale 1 puff into the lungs in the morning and at bedtime. 1 each 6   guaiFENesin -codeine  100-10 MG/5ML syrup Take 10 mLs by mouth every 6 (six) hours as needed for cough. 120 mL 0   ketotifen (ZADITOR) 0.025 % ophthalmic solution Place 1 drop into both eyes daily.      losartan  (COZAAR ) 100 MG tablet Take 1 tablet (100 mg total) by mouth daily. 90 tablet 1   predniSONE  (DELTASONE ) 20 MG tablet Take 1 tablet (20 mg total) by mouth daily with breakfast for 5 days. 5 tablet 0   tadalafil  (CIALIS ) 5 MG tablet Take 1 tablet (5 mg total) by mouth at bedtime. 90 tablet 3   tamsulosin  (FLOMAX ) 0.4 MG CAPS capsule Take 1 capsule (0.4 mg total) by mouth daily. 90 capsule 3   Testosterone  1.62 % GEL APPLY ONE PUMP TO EACH SHOULDER ONE TIME DAILY 75 g 2   verapamil  (CALAN -SR) 240 MG CR tablet Take 1 tablet (240 mg total) by mouth 2 (two) times daily. 180 tablet 1   amoxicillin  (AMOXIL )  500 MG capsule Take 2 capsules (1,000 mg total) by mouth 3 (three) times daily for 5 days. (Patient not taking: Reported on 02/25/2024) 30 capsule 0   fluticasone -salmeterol (ADVAIR) 250-50 MCG/ACT AEPB Inhale 1 puff into the lungs in the morning and at bedtime. (Patient not taking: Reported on 02/25/2024) 60 each 11   albuterol  (PROVENTIL ) (2.5 MG/3ML) 0.083% nebulizer solution Take 3 mLs (2.5 mg total) by nebulization in the morning and at bedtime. (Patient not taking: Reported on 02/22/2024) 75 mL 12   No facility-administered medications prior to visit.      [1]  Current Outpatient Medications:    albuterol  (VENTOLIN  HFA) 108 (90 Base) MCG/ACT inhaler, Inhale 1-2 puffs into the lungs every 6 (six) hours as needed., Disp: 18 g, Rfl: 0   cefpodoxime  (VANTIN ) 200 MG tablet, Take 1 tablet (200 mg total) by mouth 2 (two) times daily for 7 days., Disp: 14 tablet, Rfl: 0   dutasteride  (AVODART ) 0.5 MG capsule, Take 1 capsule (0.5 mg total) by mouth daily.,  Disp: 90 capsule, Rfl: 3   famotidine  (PEPCID ) 40 MG tablet, Take 0.5 tablets (20 mg total) by mouth daily., Disp: , Rfl:    fluticasone  (FLONASE ) 50 MCG/ACT nasal spray, USE ONE SPRAY IN EACH NOSTRIL ONE TIME DAILY, Disp: 16 mL, Rfl: 3   fluticasone -salmeterol (ADVAIR DISKUS) 500-50 MCG/ACT AEPB, Inhale 1 puff into the lungs in the morning and at bedtime., Disp: 1 each, Rfl: 6   guaiFENesin -codeine  100-10 MG/5ML syrup, Take 10 mLs by mouth every 6 (six) hours as needed for cough., Disp: 120 mL, Rfl: 0   ketotifen (ZADITOR) 0.025 % ophthalmic solution, Place 1 drop into both eyes daily. , Disp: , Rfl:    losartan  (COZAAR ) 100 MG tablet, Take 1 tablet (100 mg total) by mouth daily., Disp: 90 tablet, Rfl: 1   predniSONE  (DELTASONE ) 20 MG tablet, Take 1 tablet (20 mg total) by mouth daily with breakfast for 5 days., Disp: 5 tablet, Rfl: 0   sodium chloride  HYPERTONIC 3 % nebulizer solution, Take by nebulization as needed for other., Disp: 750 mL, Rfl: 12   tadalafil  (CIALIS ) 5 MG tablet, Take 1 tablet (5 mg total) by mouth at bedtime., Disp: 90 tablet, Rfl: 3   tamsulosin  (FLOMAX ) 0.4 MG CAPS capsule, Take 1 capsule (0.4 mg total) by mouth daily., Disp: 90 capsule, Rfl: 3   Testosterone  1.62 % GEL, APPLY ONE PUMP TO EACH SHOULDER ONE TIME DAILY, Disp: 75 g, Rfl: 2   verapamil  (CALAN -SR) 240 MG CR tablet, Take 1 tablet (240 mg total) by mouth 2 (two) times daily., Disp: 180 tablet, Rfl: 1   albuterol  (PROVENTIL ) (2.5 MG/3ML) 0.083% nebulizer solution, Take 3 mLs (2.5 mg total) by nebulization in the morning and at bedtime., Disp: 75 mL, Rfl: 12   amoxicillin  (AMOXIL ) 500 MG capsule, Take 2 capsules (1,000 mg total) by mouth 3 (three) times daily for 5 days. (Patient not taking: Reported on 02/25/2024), Disp: 30 capsule, Rfl: 0   fluticasone -salmeterol (ADVAIR) 250-50 MCG/ACT AEPB, Inhale 1 puff into the lungs in the morning and at bedtime. (Patient not taking: Reported on 02/25/2024), Disp: 60 each, Rfl:  11 [2]  Allergies Allergen Reactions   Hydralazine  Hives and Shortness Of Breath    Chest tightness, severe headache, dyspnea Other reaction(s): Chest Pain, Headache, Respiratory Distress   Wheat Extract Diarrhea and Other (See Comments)    wheat gluten extract   Gluten Meal Diarrhea    bloating   Moxifloxacin Hives  Avelox   Pseudoephedrine Other (See Comments)    Confusion, passed out, numbness   Sulfa Antibiotics Hives   Sulfonamide Derivatives Hives

## 2024-02-25 NOTE — Telephone Encounter (Signed)
 Copied from CRM #[id number]. Topic: Clinical - Prescription Issue >> Feb 25, 2024 12:11 PM Rozanna MATSU wrote: Reason for CRM: Stuart with [facility] [phone] albuterol  (PROVENTIL ) (2.5 MG/3ML) 0.083% nebulizer solution stated they are needing clear directions for this prescription please. Thanks

## 2024-02-25 NOTE — Patient Instructions (Addendum)
°  VISIT SUMMARY: You came in today for a follow-up visit after a recent asthma exacerbation. You have severe persistent asthma, which has been more manageable since your surgery last year. However, you recently experienced an exacerbation that required a change in your medication. You are feeling better with the new medications but still have some symptoms, including a persistent cough and weakness.  YOUR PLAN: -SEVERE PERSISTENT ASTHMA: Severe persistent asthma is a long-term condition where your airways are always inflamed and can become more swollen and narrow when something triggers your symptoms. You should continue taking cefpodoxime  as prescribed and use Advair 500-50 MCG/ACT inhalation twice daily. Refill and use hypertonic saline and albuterol  for nebulization. Resume your clearance regimen, including hypertonic saline, albuterol , Flutter device, and incentive spirometer twice daily, then reduce to once daily when symptoms improve. Avoid using cough syrup with codeine  and allow your cough to resolve naturally.  -BRONCHIECTASIS WITHOUT COMPLICATION: Bronchiectasis is a condition where the airways in your lungs are slightly widened, leading to increased mucus production. This can happen after an illness. Continue your clearance regimen as outlined for asthma management. We will schedule a follow-up in four weeks to assess your progress and consider reducing your inhaler therapy if your symptoms improve.  INSTRUCTIONS: Please continue taking your medications as prescribed and follow your clearance regimen. We will see you again in four weeks to check on your progress and possibly adjust your treatment plan.                      Contains text generated by Abridge.                                 Contains text generated by Abridge.

## 2024-02-28 ENCOUNTER — Telehealth: Payer: Self-pay | Admitting: Internal Medicine

## 2024-02-28 DIAGNOSIS — J4531 Mild persistent asthma with (acute) exacerbation: Secondary | ICD-10-CM

## 2024-02-28 MED ORDER — PREDNISONE 20 MG PO TABS: 20.0000 mg | ORAL_TABLET | Freq: Every day | ORAL | 1 refills | Status: AC

## 2024-02-28 NOTE — Telephone Encounter (Signed)
 Patient with wheezing and coughing  Recommend using ALB nebs every 4 hrs Prednisone  20 mg daily for another 7 days Patient advised to call clinic on Monday

## 2024-03-11 ENCOUNTER — Ambulatory Visit: Admitting: Student in an Organized Health Care Education/Training Program

## 2024-03-21 ENCOUNTER — Telehealth: Payer: Self-pay

## 2024-03-21 NOTE — Telephone Encounter (Unsigned)
 Copied from CRM [id number]. Topic: Clinical - Prescription Issue >> Mar 21, 2024 10:29 AM Corean SAUNDERS wrote: Reason for CRM: Patient states he changed his insurance to [employer] and he now needs a prior authorization submitted for fluticasone -salmeterol (ADVAIR DISKUS) 500-50 MCG/ACT AEPB  Patient states he has 2 days left of this medication.

## 2024-03-22 ENCOUNTER — Telehealth: Payer: Self-pay

## 2024-03-22 ENCOUNTER — Other Ambulatory Visit (HOSPITAL_COMMUNITY): Payer: Self-pay

## 2024-03-22 NOTE — Telephone Encounter (Signed)
 Pharmacy Patient Advocate Encounter   Received notification from Physician's Office that prior authorization for Fluticasone -Salmeterol 500-50MCG/ACT aerosol powder is required/requested.   Insurance verification completed.   The patient is insured through [facility].   Per test claim: PA required; PA started via CoverMyMeds. KEY [id number] . Waiting for clinical questions to populate.

## 2024-03-24 ENCOUNTER — Telehealth: Payer: Self-pay

## 2024-03-24 ENCOUNTER — Other Ambulatory Visit (HOSPITAL_COMMUNITY): Payer: Self-pay

## 2024-03-24 ENCOUNTER — Ambulatory Visit: Admitting: Student in an Organized Health Care Education/Training Program

## 2024-03-24 VITALS — BP 144/84 | HR 79 | Temp 97.8°F | Ht 72.0 in | Wt 199.2 lb

## 2024-03-24 DIAGNOSIS — J4550 Severe persistent asthma, uncomplicated: Secondary | ICD-10-CM | POA: Diagnosis not present

## 2024-03-24 DIAGNOSIS — J479 Bronchiectasis, uncomplicated: Secondary | ICD-10-CM

## 2024-03-24 NOTE — Patient Instructions (Signed)
" °  VISIT SUMMARY: [place], you had a follow-up appointment today to discuss your severe persistent asthma and very mild bronchiectasis. You reported significant improvement in your symptoms after a vitamin infusion, although you still occasionally cough up white sputum. We reviewed your current treatment plan and discussed new options to better manage your asthma.  YOUR PLAN: -SEVERE PERSISTENT ASTHMA: Severe persistent asthma is a chronic condition where the airways in your lungs are inflamed and narrowed, causing difficulty in breathing. You recently had an exacerbation that was treated with antibiotics and prednisone . We discussed starting biologic therapy with Tazepir (tezepelumab) to target inflammatory pathways and reduce exacerbations. This may also allow us  to reduce your inhaler dose over time. We will monitor your response to this new treatment and adjust as needed. Please continue using Wixela 500 mcg, one puff twice daily, and we will consider reducing the dose as your symptoms improve. A follow-up appointment is scheduled in three months to assess your progress.  -BRONCHIECTASIS: Bronchiectasis is a condition where the airways in your lungs are widened and scarred, often due to recurrent infections or other conditions. Your bronchiectasis is very mild and currently, your symptoms are more likely related to asthma. We will continue to monitor for any changes that may indicate bronchiectasis is contributing to your symptoms.  INSTRUCTIONS: Please continue using Wixela 500 mcg, one puff twice daily. We will consider reducing the dose as your symptoms improve. A follow-up appointment is scheduled in three months to assess your progress with the new biologic therapy, Tazepir (tezepelumab).                      Contains text generated by Abridge.                                 Contains text generated by Abridge.   "

## 2024-03-24 NOTE — Telephone Encounter (Signed)
*  Pulm  Pharmacy Patient Advocate Encounter   Received notification from Pt Calls Messages that prior authorization for Advair Diskus 500-50 is required/requested.   Insurance verification completed.   The patient is insured through [employer].   Per test claim: Refill too soon. PA is not needed at this time. Medication was filled 03/18/2024. Next eligible fill date is 04/10/2024.

## 2024-03-24 NOTE — Telephone Encounter (Signed)
 Noted.  Patient has an appt today with Dr. Isadora.   Nothing further needed.

## 2024-03-25 NOTE — Progress Notes (Signed)
 " Assessment & Plan  #Severe persistent asthma    He has a history of severe persistent asthma, with recurrent exacerbations prior to his nissen fundoplication 12/2022. Exacerbations reduced after fundoplication, but unfortunately resumed recently and his ICS/LABA dose is now back to 500, with two exacerbations over the past few months.   He overall has a T-helper cell type 1 profile, with no eosinophilia noted on previous blood work, negative allergen testing (negative IgE, no allergens detected), negative aspergillus testing, and PFT's with normal spirometry. FENO previously was normal at 20 ppb. We previously attempted to prescribe Tezepelumab but this was not covered by his insurance. Discussed this with him today, and we will re-attempt to prescribe Tezepelumab given his uncontrolled asthma.  Due to frequent exacerbations, biologic therapy is considered the next step in therapy escallation. Tezepelumab and Dupilumab are both options, but given th1 profile, I think he would benefit more from Tezepelumab. Biologics target inflammatory pathways to reduce exacerbations and may allow for a reduction in inhaler dose.   - Continue Wixela 500-50 one puff bid - start tezepelumab  #Bronchiectasis  He has very mild bronchiectasis with airway widening at the base due to recurrent aspiration from his reflux s/p fundoplication. Current symptoms are more likely related to asthma rather than bronchiectasis. Recommend he continue on his pulmonary clearance regimen, with hypertonic saline, albuterol , flutter device, and incentive spirometry. Should symptoms continue will consider repeat imaging vs bronchoscopy.   Return in about 3 months (around 06/22/2024).  Belva November, MD [facility]  I spent 33 minutes caring for this patient today, including preparing to see the patient, obtaining a medical history , reviewing a separately obtained history, performing a medically appropriate  examination and/or evaluation, counseling and educating the patient/family/caregiver, ordering medications, tests, or procedures, documenting clinical information in the electronic health record, and independently interpreting results (not separately reported/billed) and communicating results to the patient/family/caregiver  End of visit medications:  No orders of the defined types were placed in this encounter.   Current Medications[1]   Subjective:   PATIENT ID: [name] [name] GENDER: male DOB: 1955/12/23, MRN: [id number]  Chief Complaint  Patient presents with   Asthma    No SOB or wheezing. Cough with white sputum. Wixela- BID, the higher dose helps with his breathing. Albuterol - PRN. Albuterol  Neb- PRN. Hypertonic Sodium- PRN. Flonase - daily.     HPI  Discussed the use of AI scribe software for clinical note transcription with the patient, who gave verbal consent to proceed.  History of Present Illness [name] [name] is a 69 year old male with severe persistent asthma and very mild bronchiectasis who presents for follow-up.  He's previously been worked up at [facility] by Avelina Bolk, MD. At that time, he was referred for low immunoglobulin levels. A high resolution CT chest in 2020 at [facility] suggested possible early bronchiectasis. He was referred to ENT and GI for further workup of sinusitis and reflux disease. Given concern for silent aspiration triggering his asthma, further workup was pursued. He was eventually referred to thoracic surgery after esophageal manometry showed a medium to large sized hiatal hernia. Barium swallow in January of 2023 didn't show a hiatal hernia but did show severe GERD with signs of aspiration. He has since underwent robot assisted laparoscopic repair of his paraesophageal hernia and fundoplication.   Return Visit 03/25/2023:   Since our last visit, he's had his surgery 12/2022 which was uncomplicated. He felt great after the  surgery with no  symptoms and significant improvement in his cough. He was doing excellent until christmas when he caught a cold from his grandchild. This has improved drastically but he continues to have an occasional cough that is productive of clear sputum. Denies fevers or chills, denies chest pain. No shortness of breath reported. He continues on Advair 500-50. He is compliant with the flutter device and incentive spirometer, and washes his mouth with every use of Advair.    He was initially seen by me in September of 2023 for symptoms of cough productive of whitish (sometimes yellow) sputum, associated with shortness of breath, exertional dyspnea, and wheezing. He was diagnosed with asthma many years ago by Dr. Frutoso. He has had multiple exacerbations of his asthma, requiring antibiotics and steroids.   He does not have any pets and is a non-smoker (thou reports previous second hand exposure). He does not have any chest pain or tightness, nor does he have any fevers, chills, night sweats, or weight loss. He is compliant with his medications and is using Advair twice daily.   Acute Visit 02/22/2024 (Dr. Malka):   Mr. [name] is a pleasant 69 years old male patient with a past medical history of severe persistent asthma, bronchiectasis secondary to recurrent aspiration due to hiatal hernia status post fundoplication at [facility] in 2024, patient of Dr. Isadora presenting today for an acute visit for ongoing fevers, shortness of breath cough and chest tightness that started about a week ago.  This prompted his presentation last Friday to the urgent care clinic where he was prescribed amoxicillin .  Chest x-ray at that time without any parenchymal lung disease.  He reports today that he is still spiking fever up to 101 still coughing intermittently during the encounter.  He had significant wheezing with rhonchi on my exam.  He appears to be in a asthma exacerbation secondary to viral infection.  I will  prescribe him a short course of prednisone  and increase his Advair to 500-50.  Given his underlying bronchiectasis and risk factors I will also prescribe him a short course of cefpodoxime .  Return Visit 02/25/2024:   He has severe persistent asthma secondary to recurrent aspiration from reflux, which was surgically treated with a Nissen fundoplication a year ago, leading to significant improvement in symptoms. Earlier this month, he experienced an exacerbation that did not respond to a course of amoxicillin  and a short course of prednisone .   Earlier this week, he was seen by Dr. Malka and was prescribed cefpodoxime  and a course of prednisone . He began taking Advair 500/50 as directed. Since starting the new medication regimen on Tuesday, he feels better, although he still feels 'really sick' and 'really weak' and continues to cough through the night. He is almost out of the cough syrup with codeine  that was previously prescribed.   He works at [employer] with many children, which increases his exposure to illnesses, especially during the busy holiday season. He recalls an exacerbation in September, and notes that he did not use his clearance regimen, including hypertonic saline and albuterol , after feeling better post-surgery.   He reports coughing up red or rust-colored phlegm. No head congestion or green nasal discharge. He uses Flonase  for nasal symptoms. He also mentions that he had stopped using his clearance regimen, including hypertonic saline, albuterol , Flutter device, and incentive spirometer, after feeling better post-surgery.  Return Visit 03/25/2024:  He was last seen on December 18th for an exacerbation of his asthma, which was treated with antibiotics and a course  of prednisone . He experienced significant fatigue and brain fog last week but feels much better this week. An infusion of vitamins at a health bar improved his symptoms.  Since mid-December, his asthma was described as  'horrible' until last week. Despite using his inhaler and clearance regimen, he experienced persistent coughing, congestion, and wheezing. He had to sleep in a separate bedroom due to his symptoms. The cough produced white sputum, and he felt very sick and rundown despite the prednisone , which helped his wheezing to some extent. Antibiotics did not lead to improvement.  He is currently using Wixela 500, one puff twice a day, as his insurance covers it instead of Advair, noting a significant cost difference. He has not experienced wheezing since last Tuesday but continues to cough up white sputum occasionally.  In the past year, he has had multiple courses of prednisone , including two in December and one in September, following exacerbations. He recalls a similar episode in September 2024. He reports that his asthma symptoms are worse in the fall and that he has been tested for allergies.   He is a never smoker, but reports second hand exposure. He used to work at [employer] and retired recently. He continues to be active in his church. He reports significant mold exposure at [facility], and suspects there might be mold exposure at church. He does not have any pets.   Ancillary information including prior medications, full medical/surgical/family/social histories, and PFTs (when available) are listed below and have been reviewed.    Review of Systems  Constitutional:  Negative for chills, fever and weight loss.  Respiratory:  Positive for cough, sputum production and wheezing. Negative for hemoptysis and shortness of breath.   Cardiovascular:  Negative for chest pain.     Objective:   Vitals:   03/24/24 1401  BP: (!) 144/84  Pulse: 79  Temp: 97.8 F (36.6 C)  SpO2: 99%  Weight: 199 lb 3.2 oz (90.4 kg)  Height: 6' (1.829 m)   99% on RA  BMI Readings from Last 3 Encounters:  03/24/24 27.02 kg/m  02/25/24 27.67 kg/m  02/22/24 27.40 kg/m   Wt Readings from Last 3 Encounters:   03/24/24 199 lb 3.2 oz (90.4 kg)  02/25/24 204 lb (92.5 kg)  02/22/24 202 lb (91.6 kg)    Physical Exam Constitutional:      Appearance: Normal appearance.  Cardiovascular:     Rate and Rhythm: Normal rate and regular rhythm.     Pulses: Normal pulses.     Heart sounds: Normal heart sounds.  Pulmonary:     Effort: Pulmonary effort is normal.     Breath sounds: Normal breath sounds. No wheezing or rales.  Neurological:     General: No focal deficit present.     Mental Status: He is alert and oriented to person, place, and time. Mental status is at baseline.     Ancillary Information    Past Medical History:  Diagnosis Date   Allergy    Annual physical exam 01/19/2023   Arthritis    Knees & hands   Asthma    BP (high blood pressure) 11/07/2014   Should stop combination of Tekturna (DRI)  and ARB    Cataract 2018   Closed fracture of lateral malleolus of left fibula 07/23/2020   GERD (gastroesophageal reflux disease)    Hypertension    HYPERTENSION, SEVERE 03/15/2007   Qualifier: Diagnosis of  By: Winona Raring     Prostate enlargement    Sleep  apnea    Status post total knee replacement using cement, left 04/01/2019   Stress fracture of tibia and fibula 07/18/2020     Family History  Problem Relation Age of Onset   Cancer Mother        ovarian   Glaucoma Mother    Cancer Father        prostate   Hypertension Sister    Asthma Sister    Alcohol abuse Paternal Uncle    Liver disease Paternal Uncle    Cancer - Prostate Paternal Uncle    Alcohol abuse Paternal Uncle    Cancer Maternal Uncle        lung; two mat uncles   Cancer Paternal Grandmother        breast   Cancer Paternal Aunt        lung cancer in a smoker   Arthritis Maternal Grandfather    Arthritis Maternal Grandmother    ADD / ADHD Son    Anxiety disorder Son    Cancer Maternal Aunt    Cancer Maternal Uncle      Past Surgical History:  Procedure Laterality Date   APPENDECTOMY  1990    Dr Dessa   COLONOSCOPY  2008   Dr Dessa   COLONOSCOPY WITH PROPOFOL  N/A 08/13/2016   Procedure: COLONOSCOPY WITH PROPOFOL ;  Surgeon: Dessa Reyes ORN, MD;  Location: [facility] ENDOSCOPY;  Service: Endoscopy;  Laterality: N/A;   FRACTURE SURGERY  April 2023   Left Ankle   HEMORRHOID SURGERY     HERNIA REPAIR  01/20/2014   umbilical hernia   JOINT REPLACEMENT  Feb 2021   Left knee replacement   KNEE ARTHROSCOPY Left    KNEE SURGERY Left 1986   NASAL SEPTUM SURGERY     TONSILLECTOMY     TOTAL KNEE ARTHROPLASTY Left 03/29/2019   Procedure: TOTAL KNEE ARTHROPLASTY;  Surgeon: Edie Norleen PARAS, MD;  Location: [facility] ORS;  Service: Orthopedics;  Laterality: Left;   VASECTOMY      Social History   Socioeconomic History   Marital status: Married    Spouse name: Not on file   Number of children: Not on file   Years of education: Not on file   Highest education level: Doctorate  Occupational History   Not on file  Tobacco Use   Smoking status: Never   Smokeless tobacco: Never   Tobacco comments:    2nd hand smoke growing up  Vaping Use   Vaping status: Never Used  Substance and Sexual Activity   Alcohol use: Not Currently    Comment: occasionally   Drug use: Never   Sexual activity: Yes    Birth control/protection: Surgical  Other Topics Concern   Not on file  Social History Narrative   Not on file   Social Drivers of Health   Tobacco Use: Low Risk (02/25/2024)   Patient History    Smoking Tobacco Use: Never    Smokeless Tobacco Use: Never    Passive Exposure: Not on file  Financial Resource Strain: Low Risk (09/29/2023)   Overall Financial Resource Strain (CARDIA)    Difficulty of Paying Living Expenses: Not hard at all  Food Insecurity: No Food Insecurity (09/29/2023)   Epic    Worried About Radiation Protection Practitioner of Food in the Last Year: Never true    Ran Out of Food in the Last Year: Never true  Transportation Needs: No Transportation Needs (09/29/2023)   Epic    Lack of  Transportation (Medical): No  Lack of Transportation (Non-Medical): No  Physical Activity: Insufficiently Active (09/29/2023)   Exercise Vital Sign    Days of Exercise per Week: 2 days    Minutes of Exercise per Session: 20 min  Stress: No Stress Concern Present (09/29/2023)   [employer] of Occupational Health - Occupational Stress Questionnaire    Feeling of Stress: Not at all  Social Connections: Socially Integrated (09/29/2023)   Social Connection and Isolation Panel    Frequency of Communication with Friends and Family: More than three times a week    Frequency of Social Gatherings with Friends and Family: More than three times a week    Attends Religious Services: More than 4 times per year    Active Member of Clubs or Organizations: Yes    Attends Banker Meetings: More than 4 times per year    Marital Status: Married  Catering Manager Violence: Not At Risk (09/29/2023)   Epic    Fear of Current or Ex-Partner: No    Emotionally Abused: No    Physically Abused: No    Sexually Abused: No  Depression (PHQ2-9): Low Risk (09/29/2023)   Depression (PHQ2-9)    PHQ-2 Score: 0  Alcohol Screen: Low Risk (09/29/2023)   Alcohol Screen    Last Alcohol Screening Score (AUDIT): 1  Housing: Unknown (09/29/2023)   Epic    Unable to Pay for Housing in the Last Year: No    Number of Times Moved in the Last Year: Not on file    Homeless in the Last Year: No  Utilities: Not At Risk (09/29/2023)   Epic    Threatened with loss of utilities: No  Health Literacy: Adequate Health Literacy (09/29/2023)   B1300 Health Literacy    Frequency of need for help with medical instructions: Never     Allergies[2]   CBC    Component Value Date/Time   WBC 4.8 08/25/2023 0000   WBC 3.8 (L) 03/06/2023 1244   RBC 4.28 08/25/2023 0000   RBC 4.42 03/06/2023 1244   HGB 13.9 09/25/2023 0756   HCT 42.2 09/25/2023 0756   PLT 184 08/25/2023 0000   MCV 98 (H) 08/25/2023 0000   MCH 32.7  08/25/2023 0000   MCH 31.7 03/06/2023 1244   MCHC 33.4 08/25/2023 0000   MCHC 33.7 03/06/2023 1244   RDW 12.9 08/25/2023 0000   LYMPHSABS 0.6 (L) 02/01/2022 0944   LYMPHSABS 2.2 11/20/2021 0850   MONOABS 0.7 02/01/2022 0944   EOSABS 0.0 02/01/2022 0944   EOSABS 0.1 11/20/2021 0850   BASOSABS 0.1 02/01/2022 0944   BASOSABS 0.1 11/20/2021 0850    Pulmonary Functions Testing Results:    Latest Ref Rng & Units 04/03/2022   11:30 AM  PFT Results  FVC-Pre L 5.13   FVC-Predicted Pre % 104   FVC-Post L 5.27   FVC-Predicted Post % 107   Pre FEV1/FVC % % 90   Post FEV1/FCV % % 90   FEV1-Pre L 4.61   FEV1-Predicted Pre % 126   FEV1-Post L 4.75   DLCO uncorrected ml/min/mmHg 35.99   DLCO UNC% % 127   DLVA Predicted % 107   TLC L 6.80   TLC % Predicted % 91   RV % Predicted % 78     Outpatient Medications Prior to Visit  Medication Sig Dispense Refill   albuterol  (PROVENTIL ) (2.5 MG/3ML) 0.083% nebulizer solution Take 3 mLs (2.5 mg total) by nebulization in the morning and at bedtime. 75 mL 12  albuterol  (VENTOLIN  HFA) 108 (90 Base) MCG/ACT inhaler Inhale 1-2 puffs into the lungs every 6 (six) hours as needed. 18 g 0   azelastine (ASTELIN) 0.1 % nasal spray Place 1 spray into both nostrils 2 (two) times daily. Use in each nostril as directed     dutasteride  (AVODART ) 0.5 MG capsule Take 1 capsule (0.5 mg total) by mouth daily. 90 capsule 3   famotidine  (PEPCID ) 40 MG tablet Take 0.5 tablets (20 mg total) by mouth daily.     fluticasone  (FLONASE ) 50 MCG/ACT nasal spray USE ONE SPRAY IN EACH NOSTRIL ONE TIME DAILY 16 mL 3   fluticasone -salmeterol (ADVAIR DISKUS) 500-50 MCG/ACT AEPB Inhale 1 puff into the lungs in the morning and at bedtime. 1 each 6   guaiFENesin -codeine  100-10 MG/5ML syrup Take 10 mLs by mouth every 6 (six) hours as needed for cough. 120 mL 0   ketotifen (ZADITOR) 0.025 % ophthalmic solution Place 1 drop into both eyes daily.      losartan  (COZAAR ) 100 MG tablet Take  1 tablet (100 mg total) by mouth daily. 90 tablet 1   sodium chloride  HYPERTONIC 3 % nebulizer solution Take by nebulization in the morning and at bedtime. For cough and chest congestion. 750 mL 12   tadalafil  (CIALIS ) 5 MG tablet Take 1 tablet (5 mg total) by mouth at bedtime. 90 tablet 3   tamsulosin  (FLOMAX ) 0.4 MG CAPS capsule Take 1 capsule (0.4 mg total) by mouth daily. 90 capsule 3   Testosterone  1.62 % GEL APPLY ONE PUMP TO EACH SHOULDER ONE TIME DAILY 75 g 2   verapamil  (CALAN -SR) 240 MG CR tablet Take 1 tablet (240 mg total) by mouth 2 (two) times daily. 180 tablet 1   fluticasone -salmeterol (ADVAIR) 250-50 MCG/ACT AEPB Inhale 1 puff into the lungs in the morning and at bedtime. (Patient not taking: Reported on 03/24/2024) 60 each 11   predniSONE  (DELTASONE ) 20 MG tablet Take 1 tablet (20 mg total) by mouth daily with breakfast. 10 days (Patient not taking: Reported on 03/24/2024) 7 tablet 1   No facility-administered medications prior to visit.      [1]  Current Outpatient Medications:    albuterol  (PROVENTIL ) (2.5 MG/3ML) 0.083% nebulizer solution, Take 3 mLs (2.5 mg total) by nebulization in the morning and at bedtime., Disp: 75 mL, Rfl: 12   albuterol  (VENTOLIN  HFA) 108 (90 Base) MCG/ACT inhaler, Inhale 1-2 puffs into the lungs every 6 (six) hours as needed., Disp: 18 g, Rfl: 0   azelastine (ASTELIN) 0.1 % nasal spray, Place 1 spray into both nostrils 2 (two) times daily. Use in each nostril as directed, Disp: , Rfl:    dutasteride  (AVODART ) 0.5 MG capsule, Take 1 capsule (0.5 mg total) by mouth daily., Disp: 90 capsule, Rfl: 3   famotidine  (PEPCID ) 40 MG tablet, Take 0.5 tablets (20 mg total) by mouth daily., Disp: , Rfl:    fluticasone  (FLONASE ) 50 MCG/ACT nasal spray, USE ONE SPRAY IN EACH NOSTRIL ONE TIME DAILY, Disp: 16 mL, Rfl: 3   fluticasone -salmeterol (ADVAIR DISKUS) 500-50 MCG/ACT AEPB, Inhale 1 puff into the lungs in the morning and at bedtime., Disp: 1 each, Rfl: 6    guaiFENesin -codeine  100-10 MG/5ML syrup, Take 10 mLs by mouth every 6 (six) hours as needed for cough., Disp: 120 mL, Rfl: 0   ketotifen (ZADITOR) 0.025 % ophthalmic solution, Place 1 drop into both eyes daily. , Disp: , Rfl:    losartan  (COZAAR ) 100 MG tablet, Take 1 tablet (100 mg  total) by mouth daily., Disp: 90 tablet, Rfl: 1   sodium chloride  HYPERTONIC 3 % nebulizer solution, Take by nebulization in the morning and at bedtime. For cough and chest congestion., Disp: 750 mL, Rfl: 12   tadalafil  (CIALIS ) 5 MG tablet, Take 1 tablet (5 mg total) by mouth at bedtime., Disp: 90 tablet, Rfl: 3   tamsulosin  (FLOMAX ) 0.4 MG CAPS capsule, Take 1 capsule (0.4 mg total) by mouth daily., Disp: 90 capsule, Rfl: 3   Testosterone  1.62 % GEL, APPLY ONE PUMP TO EACH SHOULDER ONE TIME DAILY, Disp: 75 g, Rfl: 2   verapamil  (CALAN -SR) 240 MG CR tablet, Take 1 tablet (240 mg total) by mouth 2 (two) times daily., Disp: 180 tablet, Rfl: 1   fluticasone -salmeterol (ADVAIR) 250-50 MCG/ACT AEPB, Inhale 1 puff into the lungs in the morning and at bedtime. (Patient not taking: Reported on 03/24/2024), Disp: 60 each, Rfl: 11   predniSONE  (DELTASONE ) 20 MG tablet, Take 1 tablet (20 mg total) by mouth daily with breakfast. 10 days (Patient not taking: Reported on 03/24/2024), Disp: 7 tablet, Rfl: 1 [2]  Allergies Allergen Reactions   Hydralazine  Hives and Shortness Of Breath    Chest tightness, severe headache, dyspnea Other reaction(s): Chest Pain, Headache, Respiratory Distress   Wheat Extract Diarrhea and Other (See Comments)    wheat gluten extract   Gluten Meal Diarrhea    bloating   Moxifloxacin Hives    Avelox   Pseudoephedrine Other (See Comments)    Confusion, passed out, numbness   Sulfa Antibiotics Hives   Sulfonamide Derivatives Hives   "

## 2024-03-29 ENCOUNTER — Ambulatory Visit: Payer: PPO | Admitting: Internal Medicine

## 2024-03-29 NOTE — Telephone Encounter (Signed)
 Pharmacy Patient Advocate Encounter   Received notification from Pt Calls Messages that prior authorization for Advair Diskus 500-50 is required/requested.   Insurance verification completed.   The patient is insured through [employer].   Per test claim: Refill too soon. PA is not needed at this time. Medication was filled 03/18/2024. Next eligible fill date is 04/10/2024.

## 2024-03-30 ENCOUNTER — Telehealth: Payer: Self-pay

## 2024-03-30 NOTE — Telephone Encounter (Signed)
 Received Tezspire new start paperwork in [place]. Submitted a Prior Authorization request to [facility] for TEZSPIRE via CoverMyMeds. Will update once we receive a response.  Key: [id number]

## 2024-04-01 ENCOUNTER — Encounter: Payer: Self-pay | Admitting: Urology

## 2024-04-04 ENCOUNTER — Other Ambulatory Visit (HOSPITAL_COMMUNITY): Payer: Self-pay

## 2024-04-04 ENCOUNTER — Other Ambulatory Visit

## 2024-04-04 NOTE — Telephone Encounter (Signed)
 Received notification from [facility] regarding a prior authorization for TEZSPIRE. Authorization has been APPROVED from 03/10/24 to 03/30/25. Approval letter sent to scan center.  Per test claim, copay for 28 days supply is $1415.33  Patient can fill through [facility]: [phone]   Authorization # [id number] Phone # [phone]  Will proceed with submitting PAP

## 2024-04-07 ENCOUNTER — Other Ambulatory Visit

## 2024-04-12 ENCOUNTER — Telehealth: Payer: Self-pay | Admitting: Family Medicine

## 2024-04-12 DIAGNOSIS — I10 Essential (primary) hypertension: Secondary | ICD-10-CM

## 2024-04-12 MED ORDER — VERAPAMIL HCL ER 240 MG PO TBCR: 240.0000 mg | EXTENDED_RELEASE_TABLET | Freq: Two times a day (BID) | ORAL | 0 refills | Status: AC

## 2024-04-12 NOTE — Telephone Encounter (Signed)
[facility] faxed refill request for the following medications:  verapamil (CALAN-SR) 240 MG CR tablet   Please advise.

## 2024-04-12 NOTE — Telephone Encounter (Signed)
 Have filled medications as requested by pharmacy, patient does not have OV scheduled. When would you like him to return?

## 2024-04-13 ENCOUNTER — Other Ambulatory Visit

## 2024-04-13 DIAGNOSIS — N401 Enlarged prostate with lower urinary tract symptoms: Secondary | ICD-10-CM

## 2024-04-13 NOTE — Telephone Encounter (Signed)
 Patient has been scheduled for 07/14/24 at 1:20pm.

## 2024-04-14 ENCOUNTER — Other Ambulatory Visit

## 2024-04-14 LAB — HEMOGLOBIN AND HEMATOCRIT, BLOOD
Hematocrit: 44.3 % (ref 37.5–51.0)
Hemoglobin: 14.6 g/dL (ref 13.0–17.7)

## 2024-04-14 LAB — TESTOSTERONE: Testosterone: 880 ng/dL (ref 264–916)

## 2024-07-05 ENCOUNTER — Ambulatory Visit: Payer: Self-pay | Admitting: Internal Medicine

## 2024-07-08 ENCOUNTER — Ambulatory Visit: Admitting: Student in an Organized Health Care Education/Training Program

## 2024-07-14 ENCOUNTER — Ambulatory Visit: Admitting: Family Medicine

## 2024-09-26 ENCOUNTER — Other Ambulatory Visit

## 2024-10-04 ENCOUNTER — Ambulatory Visit: Admitting: Urology
